# Patient Record
Sex: Male | Born: 1940 | Race: White | Hispanic: No | Marital: Single | Smoking: Former smoker
Health system: Southern US, Community
[De-identification: ages and names within clinical notes are randomized; demographics above are authoritative.]

## PROBLEM LIST (undated history)

## (undated) DIAGNOSIS — I82409 Acute embolism and thrombosis of unspecified deep veins of unspecified lower extremity: Secondary | ICD-10-CM

## (undated) DIAGNOSIS — N419 Inflammatory disease of prostate, unspecified: Secondary | ICD-10-CM

## (undated) DIAGNOSIS — I509 Heart failure, unspecified: Secondary | ICD-10-CM

## (undated) DIAGNOSIS — N3090 Cystitis, unspecified without hematuria: Secondary | ICD-10-CM

## (undated) DIAGNOSIS — C3490 Malignant neoplasm of unspecified part of unspecified bronchus or lung: Secondary | ICD-10-CM

## (undated) DIAGNOSIS — D649 Anemia, unspecified: Secondary | ICD-10-CM

## (undated) DIAGNOSIS — E119 Type 2 diabetes mellitus without complications: Secondary | ICD-10-CM

## (undated) DIAGNOSIS — B999 Unspecified infectious disease: Secondary | ICD-10-CM

## (undated) DIAGNOSIS — G4733 Obstructive sleep apnea (adult) (pediatric): Secondary | ICD-10-CM

## (undated) DIAGNOSIS — R911 Solitary pulmonary nodule: Secondary | ICD-10-CM

## (undated) DIAGNOSIS — I10 Essential (primary) hypertension: Secondary | ICD-10-CM

## (undated) DIAGNOSIS — N289 Disorder of kidney and ureter, unspecified: Secondary | ICD-10-CM

## (undated) DIAGNOSIS — Z9989 Dependence on other enabling machines and devices: Secondary | ICD-10-CM

## (undated) DIAGNOSIS — R319 Hematuria, unspecified: Secondary | ICD-10-CM

## (undated) DIAGNOSIS — G629 Polyneuropathy, unspecified: Secondary | ICD-10-CM

## (undated) DIAGNOSIS — A419 Sepsis, unspecified organism: Secondary | ICD-10-CM

## (undated) DIAGNOSIS — C189 Malignant neoplasm of colon, unspecified: Secondary | ICD-10-CM

## (undated) DIAGNOSIS — G4730 Sleep apnea, unspecified: Secondary | ICD-10-CM

## (undated) HISTORY — PX: LEG SURGERY: SHX1003

## (undated) HISTORY — PX: EYE SURGERY: SHX253

## (undated) HISTORY — DX: Malignant neoplasm of colon, unspecified: C18.9

## (undated) HISTORY — PX: CATARACT EXTRACTION: SUR2

## (undated) HISTORY — PX: CHOLECYSTECTOMY: SHX55

## (undated) HISTORY — PX: ORIF FEMUR FRACTURE: SHX2119

## (undated) HISTORY — PX: BACK SURGERY: SHX140

## (undated) HISTORY — DX: Solitary pulmonary nodule: R91.1

## (undated) HISTORY — PX: COLONOSCOPY: SHX174

## (undated) HISTORY — DX: Inflammatory disease of prostate, unspecified: N41.9

## (undated) HISTORY — DX: Cystitis, unspecified without hematuria: N30.90

## (undated) HISTORY — PX: CERVICAL FUSION: SHX112

## (undated) HISTORY — DX: Sepsis, unspecified organism: A41.9

## (undated) HISTORY — DX: Hematuria, unspecified: R31.9

## (undated) HISTORY — DX: Unspecified infectious disease: B99.9

---

## 2009-03-16 ENCOUNTER — Ambulatory Visit: Payer: Self-pay | Admitting: Cardiology

## 2009-04-21 ENCOUNTER — Ambulatory Visit (HOSPITAL_COMMUNITY): Admission: RE | Admit: 2009-04-21 | Discharge: 2009-04-21 | Payer: Self-pay | Admitting: General Surgery

## 2009-05-01 ENCOUNTER — Ambulatory Visit: Payer: Self-pay | Admitting: Cardiology

## 2009-06-02 ENCOUNTER — Ambulatory Visit (HOSPITAL_COMMUNITY): Admission: RE | Admit: 2009-06-02 | Discharge: 2009-06-02 | Payer: Self-pay | Admitting: General Surgery

## 2009-06-30 ENCOUNTER — Encounter: Payer: Self-pay | Admitting: Physician Assistant

## 2010-05-02 LAB — BASIC METABOLIC PANEL
BUN: 6 mg/dL (ref 6–23)
CO2: 28 mEq/L (ref 19–32)
Calcium: 8.5 mg/dL (ref 8.4–10.5)
Chloride: 103 mEq/L (ref 96–112)
Creatinine, Ser: 1.16 mg/dL (ref 0.4–1.5)
GFR calc Af Amer: >60 mL/min (ref >60)
GFR calc non Af Amer: >60 mL/min (ref >60)
Glucose, Bld: 240 mg/dL — ABNORMAL HIGH (ref 70–99)
Potassium: 3.6 mEq/L (ref 3.5–5.1)
Sodium: 139 mEq/L (ref 135–145)

## 2010-05-02 LAB — CBC
HCT: 34.9 % — ABNORMAL LOW (ref 39.0–52.0)
Hemoglobin: 11.8 g/dL — ABNORMAL LOW (ref 13.0–17.0)
MCHC: 33.8 g/dL (ref 30.0–36.0)
MCV: 94.6 fL (ref 78.0–100.0)
Platelets: 368 10*3/uL (ref 150–400)
RBC: 3.69 MIL/uL — ABNORMAL LOW (ref 4.22–5.81)
RDW: 15.2 % (ref 11.5–15.5)
WBC: 11 10*3/uL — ABNORMAL HIGH (ref 4.0–10.5)

## 2010-05-02 LAB — GLUCOSE, CAPILLARY
Glucose-Capillary: 187 mg/dL — ABNORMAL HIGH (ref 70–99)
Glucose-Capillary: 187 mg/dL — ABNORMAL HIGH (ref 70–99)

## 2011-06-14 DIAGNOSIS — G959 Disease of spinal cord, unspecified: Secondary | ICD-10-CM | POA: Diagnosis present

## 2011-08-22 ENCOUNTER — Ambulatory Visit: Payer: Medicare Other | Attending: Neurosurgery | Admitting: Physical Therapy

## 2011-08-22 DIAGNOSIS — R5381 Other malaise: Secondary | ICD-10-CM | POA: Insufficient documentation

## 2011-08-22 DIAGNOSIS — R269 Unspecified abnormalities of gait and mobility: Secondary | ICD-10-CM | POA: Insufficient documentation

## 2011-08-22 DIAGNOSIS — M542 Cervicalgia: Secondary | ICD-10-CM | POA: Insufficient documentation

## 2011-08-22 DIAGNOSIS — IMO0001 Reserved for inherently not codable concepts without codable children: Secondary | ICD-10-CM | POA: Insufficient documentation

## 2011-08-23 ENCOUNTER — Ambulatory Visit: Payer: Medicare Other | Admitting: Physical Therapy

## 2011-08-29 ENCOUNTER — Ambulatory Visit: Payer: Medicare Other | Admitting: *Deleted

## 2011-08-30 ENCOUNTER — Ambulatory Visit: Payer: Medicare Other | Admitting: Physical Therapy

## 2011-09-05 ENCOUNTER — Ambulatory Visit: Payer: Medicare Other | Admitting: *Deleted

## 2011-09-06 ENCOUNTER — Ambulatory Visit: Payer: Medicare Other | Admitting: Physical Therapy

## 2011-09-12 ENCOUNTER — Ambulatory Visit: Payer: Medicare Other | Attending: Neurosurgery | Admitting: *Deleted

## 2011-09-12 DIAGNOSIS — M542 Cervicalgia: Secondary | ICD-10-CM | POA: Insufficient documentation

## 2011-09-12 DIAGNOSIS — R269 Unspecified abnormalities of gait and mobility: Secondary | ICD-10-CM | POA: Insufficient documentation

## 2011-09-12 DIAGNOSIS — IMO0001 Reserved for inherently not codable concepts without codable children: Secondary | ICD-10-CM | POA: Insufficient documentation

## 2011-09-12 DIAGNOSIS — R5381 Other malaise: Secondary | ICD-10-CM | POA: Insufficient documentation

## 2011-09-13 ENCOUNTER — Ambulatory Visit: Payer: Medicare Other | Admitting: Physical Therapy

## 2011-09-19 ENCOUNTER — Ambulatory Visit: Payer: Medicare Other | Admitting: Physical Therapy

## 2011-09-20 ENCOUNTER — Ambulatory Visit: Payer: Medicare Other | Admitting: Physical Therapy

## 2011-09-26 ENCOUNTER — Ambulatory Visit: Payer: Medicare Other | Admitting: Physical Therapy

## 2011-09-27 DIAGNOSIS — Z981 Arthrodesis status: Secondary | ICD-10-CM | POA: Insufficient documentation

## 2011-10-03 ENCOUNTER — Ambulatory Visit: Payer: Medicare Other | Admitting: Physical Therapy

## 2011-10-04 ENCOUNTER — Ambulatory Visit: Payer: Medicare Other | Admitting: Physical Therapy

## 2011-10-10 ENCOUNTER — Ambulatory Visit: Payer: Medicare Other | Attending: Neurosurgery | Admitting: Physical Therapy

## 2011-10-10 DIAGNOSIS — R5381 Other malaise: Secondary | ICD-10-CM | POA: Insufficient documentation

## 2011-10-10 DIAGNOSIS — M542 Cervicalgia: Secondary | ICD-10-CM | POA: Insufficient documentation

## 2011-10-10 DIAGNOSIS — IMO0001 Reserved for inherently not codable concepts without codable children: Secondary | ICD-10-CM | POA: Insufficient documentation

## 2011-10-10 DIAGNOSIS — R269 Unspecified abnormalities of gait and mobility: Secondary | ICD-10-CM | POA: Insufficient documentation

## 2011-10-11 ENCOUNTER — Ambulatory Visit: Payer: Medicare Other | Admitting: Physical Therapy

## 2011-10-17 ENCOUNTER — Ambulatory Visit: Payer: Medicare Other | Admitting: Physical Therapy

## 2012-02-07 DIAGNOSIS — I82409 Acute embolism and thrombosis of unspecified deep veins of unspecified lower extremity: Secondary | ICD-10-CM

## 2012-02-07 HISTORY — DX: Acute embolism and thrombosis of unspecified deep veins of unspecified lower extremity: I82.409

## 2012-04-03 ENCOUNTER — Encounter (HOSPITAL_COMMUNITY): Payer: Self-pay | Admitting: Pharmacy Technician

## 2012-04-04 ENCOUNTER — Encounter (HOSPITAL_COMMUNITY): Payer: Self-pay

## 2012-04-04 ENCOUNTER — Encounter (HOSPITAL_COMMUNITY)
Admission: RE | Admit: 2012-04-04 | Discharge: 2012-04-04 | Disposition: A | Discharge status: Home / Self Care | Payer: Medicare Other | Source: Ambulatory Visit | Attending: Ophthalmology | Admitting: Ophthalmology

## 2012-04-04 HISTORY — DX: Essential (primary) hypertension: I10

## 2012-04-04 HISTORY — DX: Polyneuropathy, unspecified: G62.9

## 2012-04-04 HISTORY — DX: Sleep apnea, unspecified: G47.30

## 2012-04-04 HISTORY — DX: Type 2 diabetes mellitus without complications: E11.9

## 2012-04-04 LAB — HEMOGLOBIN AND HEMATOCRIT, BLOOD
HCT: 38.9 % — ABNORMAL LOW (ref 39.0–52.0)
Hemoglobin: 13.3 g/dL (ref 13.0–17.0)

## 2012-04-04 LAB — BASIC METABOLIC PANEL
BUN: 19 mg/dL (ref 6–23)
CO2: 26 mEq/L (ref 19–32)
Calcium: 9.3 mg/dL (ref 8.4–10.5)
Chloride: 100 mEq/L (ref 96–112)
Creatinine, Ser: 0.97 mg/dL (ref 0.50–1.35)
GFR calc Af Amer: >90 mL/min (ref >90)
GFR calc non Af Amer: 82 mL/min — ABNORMAL LOW (ref >90)
Glucose, Bld: 96 mg/dL (ref 70–99)
Potassium: 4.9 mEq/L (ref 3.5–5.1)
Sodium: 137 mEq/L (ref 135–145)

## 2012-04-04 NOTE — Patient Instructions (Signed)
Your procedure is scheduled on:  04/11/12  Report to [facility] at 08:30 AM.  Call this number if you have problems the morning of surgery: [phone]   Remember:   Do not eat or drink:After Midnight.  Take these medicines the morning of surgery with A SIP OF WATER: Lisinopril. May take your Gabapentin if needed. Take only half of your dose of insulin the evening before surgery.    Do not wear jewelry, make-up or nail polish.  Do not wear lotions, powders, or perfumes. You may wear deodorant.  Do not shave 48 hours prior to surgery. Men may shave face and neck.  Do not bring valuables to the hospital.  Contacts, dentures or bridgework may not be worn into surgery.  Leave suitcase in the car. After surgery it may be brought to your room.  For patients admitted to the hospital, checkout time is 11:00 AM the day of discharge.   Patients discharged the day of surgery will not be allowed to drive home.    Special Instructions: Start using your eye drops before surgery as directed by your eye doctor.   Please read over the following fact sheets that you were given: Anesthesia Post-op Instructions    Cataract Surgery  A cataract is a clouding of the lens of the eye. When a lens becomes cloudy, vision is reduced based on the degree and nature of the clouding. Surgery may be needed to improve vision. Surgery removes the cloudy lens and usually replaces it with a substitute lens (intraocular lens, IOL). LET YOUR EYE DOCTOR KNOW ABOUT:  Allergies to food or medicine.  Medicines taken including herbs, eyedrops, over-the-counter medicines, and creams.  Use of steroids (by mouth or creams).  Previous problems with anesthetics or numbing medicine.  History of bleeding problems or blood clots.  Previous surgery.  Other health problems, including diabetes and kidney problems.  Possibility of pregnancy, if this applies. RISKS AND COMPLICATIONS  Infection.  Inflammation of the eyeball  (endophthalmitis) that can spread to both eyes (sympathetic ophthalmia).  Poor wound healing.  If an IOL is inserted, it can later fall out of proper position. This is very uncommon.  Clouding of the part of your eye that holds an IOL in place. This is called an "after-cataract." These are uncommon, but easily treated. BEFORE THE PROCEDURE  Do not eat or drink anything except small amounts of water for 8 to 12 before your surgery, or as directed by your caregiver.  Unless you are told otherwise, continue any eyedrops you have been prescribed.  Talk to your primary caregiver about all other medicines that you take (both prescription and non-prescription). In some cases, you may need to stop or change medicines near the time of your surgery. This is most important if you are taking blood-thinning medicine.Do not stop medicines unless you are told to do so.  Arrange for someone to drive you to and from the procedure.  Do not put contact lenses in either eye on the day of your surgery. PROCEDURE There is more than one method for safely removing a cataract. Your doctor can explain the differences and help determine which is best for you. Phacoemulsification surgery is the most common form of cataract surgery.  An injection is given behind the eye or eyedrops are given to make this a painless procedure.  A small cut (incision) is made on the edge of the clear, dome-shaped surface that covers the front of the eye (cornea).  A tiny probe  is painlessly inserted into the eye. This device gives off ultrasound waves that soften and break up the cloudy center of the lens. This makes it easier for the cloudy lens to be removed by suction.  An IOL may be implanted.  The normal lens of the eye is covered by a clear capsule. Part of that capsule is intentionally left in the eye to support the IOL.  Your surgeon may or may not use stitches to close the incision. There are other forms of cataract  surgery that require a larger incision and stiches to close the eye. This approach is taken in cases where the doctor feels that the cataract cannot be easily removed using phacoemulsification. AFTER THE PROCEDURE  When an IOL is implanted, it does not need care. It becomes a permanent part of your eye and cannot be seen or felt.  Your doctor will schedule follow-up exams to check on your progress.  Review your other medicines with your doctor to see which can be resumed after surgery.  Use eyedrops or take medicine as prescribed by your doctor. Document Released: 01/12/2011 Document Revised: 04/17/2011 Document Reviewed: 01/12/2011 [facility] Patient Information 2013 [place].    PATIENT INSTRUCTIONS POST-ANESTHESIA  IMMEDIATELY FOLLOWING SURGERY:  Do not drive or operate machinery for the first twenty four hours after surgery.  Do not make any important decisions for twenty four hours after surgery or while taking narcotic pain medications or sedatives.  If you develop intractable nausea and vomiting or a severe headache please notify your doctor immediately.  FOLLOW-UP:  Please make an appointment with your surgeon as instructed. You do not need to follow up with anesthesia unless specifically instructed to do so.  WOUND CARE INSTRUCTIONS (if applicable):  Keep a dry clean dressing on the anesthesia/puncture wound site if there is drainage.  Once the wound has quit draining you may leave it open to air.  Generally you should leave the bandage intact for twenty four hours unless there is drainage.  If the epidural site drains for more than 36-48 hours please call the anesthesia department.  QUESTIONS?:  Please feel free to call your physician or the hospital operator if you have any questions, and they will be happy to assist you.

## 2012-04-11 ENCOUNTER — Encounter (HOSPITAL_COMMUNITY): Payer: Self-pay | Admitting: *Deleted

## 2012-04-11 ENCOUNTER — Encounter (HOSPITAL_COMMUNITY): Payer: Self-pay | Admitting: Anesthesiology

## 2012-04-11 ENCOUNTER — Encounter (HOSPITAL_COMMUNITY)
Admission: RE | Disposition: A | Discharge status: Home / Self Care | Payer: Self-pay | Source: Ambulatory Visit | Attending: Ophthalmology

## 2012-04-11 ENCOUNTER — Ambulatory Visit (HOSPITAL_COMMUNITY): Payer: Medicare Other | Admitting: Anesthesiology

## 2012-04-11 ENCOUNTER — Ambulatory Visit (HOSPITAL_COMMUNITY)
Admission: RE | Admit: 2012-04-11 | Discharge: 2012-04-11 | Disposition: A | Discharge status: Home / Self Care | Payer: Medicare Other | Source: Ambulatory Visit | Attending: Ophthalmology | Admitting: Ophthalmology

## 2012-04-11 DIAGNOSIS — Z01812 Encounter for preprocedural laboratory examination: Secondary | ICD-10-CM | POA: Insufficient documentation

## 2012-04-11 DIAGNOSIS — H2589 Other age-related cataract: Secondary | ICD-10-CM | POA: Insufficient documentation

## 2012-04-11 DIAGNOSIS — I10 Essential (primary) hypertension: Secondary | ICD-10-CM | POA: Insufficient documentation

## 2012-04-11 DIAGNOSIS — Z01810 Encounter for preprocedural cardiovascular examination: Secondary | ICD-10-CM | POA: Insufficient documentation

## 2012-04-11 DIAGNOSIS — H2181 Floppy iris syndrome: Secondary | ICD-10-CM | POA: Insufficient documentation

## 2012-04-11 DIAGNOSIS — E119 Type 2 diabetes mellitus without complications: Secondary | ICD-10-CM | POA: Insufficient documentation

## 2012-04-11 HISTORY — PX: CATARACT EXTRACTION W/PHACO: SHX586

## 2012-04-11 LAB — GLUCOSE, CAPILLARY: Glucose-Capillary: 80 mg/dL (ref 70–99)

## 2012-04-11 SURGERY — PHACOEMULSIFICATION, CATARACT, WITH IOL INSERTION
Anesthesia: Monitor Anesthesia Care | Site: Eye | Laterality: Right | Wound class: Clean

## 2012-04-11 MED ORDER — PHENYLEPHRINE HCL 2.5 % OP SOLN
OPHTHALMIC | Status: AC
  Filled 2012-04-11: qty 2

## 2012-04-11 MED ORDER — CYCLOPENTOLATE-PHENYLEPHRINE 0.2-1 % OP SOLN
OPHTHALMIC | Status: AC
  Filled 2012-04-11: qty 2

## 2012-04-11 MED ORDER — LACTATED RINGERS IV SOLN
INTRAVENOUS | Status: DC
  Administered 2012-04-11: 08:00:00 via INTRAVENOUS

## 2012-04-11 MED ORDER — MIDAZOLAM HCL 2 MG/2ML IJ SOLN
INTRAMUSCULAR | Status: AC
  Filled 2012-04-11: qty 2

## 2012-04-11 MED ORDER — EPINEPHRINE HCL 1 MG/ML IJ SOLN
INTRAOCULAR | Status: DC | PRN
  Administered 2012-04-11: 08:00:00

## 2012-04-11 MED ORDER — MIDAZOLAM HCL 2 MG/2ML IJ SOLN
1.0000 mg | INTRAMUSCULAR | Status: DC | PRN
  Administered 2012-04-11: 2 mg via INTRAVENOUS

## 2012-04-11 MED ORDER — NEOMYCIN-POLYMYXIN-DEXAMETH 3.5-10000-0.1 OP OINT
TOPICAL_OINTMENT | OPHTHALMIC | Status: AC
  Filled 2012-04-11: qty 3.5

## 2012-04-11 MED ORDER — EPINEPHRINE HCL 1 MG/ML IJ SOLN
INTRAMUSCULAR | Status: AC
  Filled 2012-04-11: qty 1

## 2012-04-11 MED ORDER — CYCLOPENTOLATE-PHENYLEPHRINE 0.2-1 % OP SOLN
1.0000 [drp] | OPHTHALMIC | Status: AC
  Administered 2012-04-11 (×3): 1 [drp] via OPHTHALMIC

## 2012-04-11 MED ORDER — POVIDONE-IODINE 5 % OP SOLN
OPHTHALMIC | Status: DC | PRN
  Administered 2012-04-11: 1 via OPHTHALMIC

## 2012-04-11 MED ORDER — BSS IO SOLN
INTRAOCULAR | Status: DC | PRN
  Administered 2012-04-11: 15 mL via INTRAOCULAR

## 2012-04-11 MED ORDER — LIDOCAINE HCL (PF) 1 % IJ SOLN
INTRAMUSCULAR | Status: AC
  Filled 2012-04-11: qty 2

## 2012-04-11 MED ORDER — LIDOCAINE HCL (PF) 1 % IJ SOLN
INTRAOCULAR | Status: DC | PRN
  Administered 2012-04-11: 08:00:00 via OPHTHALMIC

## 2012-04-11 MED ORDER — NA HYALUR & NA CHOND-NA HYALUR 0.55-0.5 ML IO KIT
PACK | INTRAOCULAR | Status: DC | PRN
  Administered 2012-04-11: 1 via OPHTHALMIC

## 2012-04-11 MED ORDER — TETRACAINE HCL 0.5 % OP SOLN
OPHTHALMIC | Status: AC
  Filled 2012-04-11: qty 2

## 2012-04-11 MED ORDER — LIDOCAINE 3.5 % OP GEL OPTIME - NO CHARGE
OPHTHALMIC | Status: DC | PRN
  Administered 2012-04-11: 1 [drp] via OPHTHALMIC

## 2012-04-11 MED ORDER — LIDOCAINE HCL 3.5 % OP GEL: 1.0000 "application " | Freq: Once | OPHTHALMIC | Status: DC

## 2012-04-11 MED ORDER — NEOMYCIN-POLYMYXIN-DEXAMETH 0.1 % OP OINT
TOPICAL_OINTMENT | OPHTHALMIC | Status: DC | PRN
  Administered 2012-04-11: 1 via OPHTHALMIC

## 2012-04-11 MED ORDER — PHENYLEPHRINE HCL 2.5 % OP SOLN
1.0000 [drp] | OPHTHALMIC | Status: AC
  Administered 2012-04-11 (×3): 1 [drp] via OPHTHALMIC

## 2012-04-11 MED ORDER — TETRACAINE HCL 0.5 % OP SOLN
1.0000 [drp] | OPHTHALMIC | Status: AC
  Administered 2012-04-11 (×3): 1 [drp] via OPHTHALMIC

## 2012-04-11 MED ORDER — LIDOCAINE HCL 3.5 % OP GEL
OPHTHALMIC | Status: AC
  Filled 2012-04-11: qty 5

## 2012-04-11 SURGICAL SUPPLY — 32 items

## 2012-04-11 NOTE — H&P (Signed)
I have reviewed the H&P, the patient was re-examined, and I have identified no interval changes in medical condition and plan of care since the history and physical of record  

## 2012-04-11 NOTE — Anesthesia Procedure Notes (Signed)
Procedure Name: MAC Date/Time: 04/11/2012 8:23 AM Performed by: Franco Nones Pre-anesthesia Checklist: Patient identified, Emergency Drugs available, Suction available, Timeout performed and Patient being monitored Patient Re-evaluated:Patient Re-evaluated prior to inductionOxygen Delivery Method: Nasal Cannula

## 2012-04-11 NOTE — Transfer of Care (Signed)
Immediate Anesthesia Transfer of Care Note  Patient: Gary Nelson  Procedure(s) Performed: Procedure(s) (LRB): CATARACT EXTRACTION PHACO AND INTRAOCULAR LENS PLACEMENT (IOC) (Right)  Patient Location: Shortstay  Anesthesia Type: MAC  Level of Consciousness: awake  Airway & Oxygen Therapy: Patient Spontanous Breathing   Post-op Assessment: Report given to PACU RN, Post -op Vital signs reviewed and stable and Patient moving all extremities  Post vital signs: Reviewed and stable  Complications: No apparent anesthesia complications

## 2012-04-11 NOTE — Anesthesia Preprocedure Evaluation (Signed)
Anesthesia Evaluation  Patient identified by MRN, date of birth, ID band Patient awake    Reviewed: Allergy & Precautions, H&P , NPO status , Patient's Chart, lab work & pertinent test results  Airway Mallampati: II TM Distance: >3 FB     Dental  (+) Poor Dentition, Missing, Chipped and Dental Advisory Given   Pulmonary sleep apnea, Continuous Positive Airway Pressure Ventilation and Oxygen sleep apnea , former smoker,  breath sounds clear to auscultation        Cardiovascular hypertension, Pt. on medications + dysrhythmias (hx irreg HR) Rhythm:Regular Rate:Normal     Neuro/Psych    GI/Hepatic   Endo/Other  diabetes, Well Controlled, Type 2, Insulin DependentMorbid obesity  Renal/GU      Musculoskeletal   Abdominal   Peds  Hematology   Anesthesia Other Findings   Reproductive/Obstetrics                           Anesthesia Physical Anesthesia Plan  ASA: III  Anesthesia Plan: MAC   Post-op Pain Management:    Induction: Intravenous  Airway Management Planned: Nasal Cannula  Additional Equipment:   Intra-op Plan:   Post-operative Plan:   Informed Consent: I have reviewed the patients History and Physical, chart, labs and discussed the procedure including the risks, benefits and alternatives for the proposed anesthesia with the patient or authorized representative who has indicated his/her understanding and acceptance.     Plan Discussed with:   Anesthesia Plan Comments:         Anesthesia Quick Evaluation

## 2012-04-11 NOTE — Op Note (Signed)
NAMELUM, STILLINGER                ACCOUNT NO.:  [id number]  MEDICAL RECORD NO.:  [id number]  LOCATION:  [facility]                          FACILITY:  [facility]  PHYSICIAN:  Susanne Greenhouse, MD       DATE OF BIRTH:  [birth year]  DATE OF PROCEDURE:  04/11/2012 DATE OF DISCHARGE:  04/11/2012                              OPERATIVE REPORT   PREOPERATIVE DIAGNOSIS:  Combined cataract, right eye, diagnosis code 366.19.  POSTOPERATIVE DIAGNOSES:  Combined cataract, right eye, diagnosis code 366.19; intraoperative floppy iris syndrome, right eye, diagnosis code 364.81.  PROCEDURE:  Phacoemulsification of posterior chamber intraocular lens implantation, right eye.  SURGEON:  Bonne Dolores. Hunt, MD  ANESTHESIA:  Topical with monitored anesthesia care and IV sedation.  OPERATIVE SUMMARY:  In the preoperative area, dilating drops were placed into the right eye.  The patient was then brought into the operating room where he was placed under general anesthesia.  The eye was then prepped and draped.  Beginning with a 75 blade, a paracentesis port was made at the surgeon's 2 o'clock position.  The anterior chamber was then filled with a 1% nonpreserved lidocaine solution with epinephrine.  This was followed by Viscoat to deepen the chamber.  A small fornix-based peritomy was performed superiorly.  Next, a single iris hook was placed through the limbus superiorly.  A 2.4-mm keratome blade was then used to make a clear corneal incision over the iris hook.  A bent cystotome needle and Utrata forceps were used to create a continuous tear capsulotomy.  Hydrodissection was performed using balanced salt solution on a fine cannula.  The lens nucleus was then removed using phacoemulsification in a quadrant cracking technique.  The cortical material was then removed with irrigation and aspiration.  The capsular bag and anterior chamber were refilled with Provisc.  The wound was widened to approximately 3 mm and a  posterior chamber intraocular lens was placed into the capsular bag without difficulty using an [employer] lens injecting system.  A single 10-0 nylon suture was then used to close the incision as well as stromal hydration.  The Provisc was removed from the anterior chamber and capsular bag with irrigation and aspiration.  At this point, the wounds were tested for leak, which were negative.  The anterior chamber remained deep and stable.  The patient tolerated the procedure well.  There were no operative complications, and he awoke from general anesthesia without problem.  No surgical specimens.  Prosthetic device used is a Theme park manager, model EnVista, model number MX60, power of 17.0, serial number is [id number].          ______________________________ Susanne Greenhouse, MD     KEH/MEDQ  D:  04/11/2012  T:  04/11/2012  Job:  [id number]

## 2012-04-11 NOTE — Brief Op Note (Signed)
Pre-Op Dx: Cataract OD Post-Op Dx: Cataract OD Surgeon: Sofia Vanmeter Anesthesia: Topical with MAC Surgery: Cataract Extraction with Intraocular lens Implant OD Implant: B&L enVista Specimen: None Complications: None 

## 2012-04-11 NOTE — Anesthesia Postprocedure Evaluation (Signed)
  Anesthesia Post-op Note  Patient: Gary Nelson  Procedure(s) Performed: Procedure(s) (LRB): CATARACT EXTRACTION PHACO AND INTRAOCULAR LENS PLACEMENT (IOC) (Right)  Patient Location:  Short Stay  Anesthesia Type: MAC  Level of Consciousness: awake  Airway and Oxygen Therapy: Patient Spontanous Breathing  Post-op Pain: none  Post-op Assessment: Post-op Vital signs reviewed, Patient's Cardiovascular Status Stable, Respiratory Function Stable, Patent Airway, No signs of Nausea or vomiting and Pain level controlled  Post-op Vital Signs: Reviewed and stable  Complications: No apparent anesthesia complications

## 2012-04-15 ENCOUNTER — Encounter (HOSPITAL_COMMUNITY): Payer: Self-pay | Admitting: Ophthalmology

## 2012-08-01 DIAGNOSIS — Z0000 Encounter for general adult medical examination without abnormal findings: Secondary | ICD-10-CM

## 2012-09-06 ENCOUNTER — Encounter: Payer: Self-pay | Admitting: Cardiology

## 2012-09-09 ENCOUNTER — Encounter: Payer: Self-pay | Admitting: Cardiovascular Disease

## 2012-09-09 ENCOUNTER — Ambulatory Visit (INDEPENDENT_AMBULATORY_CARE_PROVIDER_SITE_OTHER): Payer: Medicare Other | Admitting: Cardiovascular Disease

## 2012-09-09 ENCOUNTER — Other Ambulatory Visit: Payer: Self-pay | Admitting: *Deleted

## 2012-09-09 ENCOUNTER — Encounter: Payer: Self-pay | Admitting: Cardiology

## 2012-09-09 VITALS — BP 123/63 | HR 87 | Ht 65.0 in | Wt 267.0 lb

## 2012-09-09 DIAGNOSIS — R0602 Shortness of breath: Secondary | ICD-10-CM

## 2012-09-09 DIAGNOSIS — I10 Essential (primary) hypertension: Secondary | ICD-10-CM | POA: Insufficient documentation

## 2012-09-09 DIAGNOSIS — Z01810 Encounter for preprocedural cardiovascular examination: Secondary | ICD-10-CM

## 2012-09-09 DIAGNOSIS — G4730 Sleep apnea, unspecified: Secondary | ICD-10-CM

## 2012-09-09 DIAGNOSIS — N189 Chronic kidney disease, unspecified: Secondary | ICD-10-CM

## 2012-09-09 DIAGNOSIS — E119 Type 2 diabetes mellitus without complications: Secondary | ICD-10-CM | POA: Insufficient documentation

## 2012-09-09 DIAGNOSIS — I872 Venous insufficiency (chronic) (peripheral): Secondary | ICD-10-CM | POA: Insufficient documentation

## 2012-09-09 NOTE — Patient Instructions (Addendum)
Your physician has requested that you have an echocardiogram. Echocardiography is a painless test that uses sound waves to create images of your heart. It provides your doctor with information about the size and shape of your heart and how well your heart's chambers and valves are working. This procedure takes approximately one hour. There are no restrictions for this procedure.  Lexiscan cardiolite (Stress Test - 2 day).  Your physician recommends that you continue on your current medications as directed. Please refer to the Current Medication list given to you today.  A follow up appointment will be based off test results.

## 2012-09-09 NOTE — Progress Notes (Signed)
Patient ID: Gary Nelson, male   DOB: 06/27/1940, 72 y.o.   MRN: [id number]    CARDIOLOGY CONSULT NOTE  Patient ID: Gary Nelson MRN: [id number] DOB/AGE: May 01, 1940 72 y.o.  Admit date: (Not on file) Primary Physician: Oley Balm. Margo Common, MD  Reason for Consultation: preoperative risk stratification  HPI:  Mr. Los is a 72 y.o. Male with a PMH significant for HTN, morbid obesity, CKD, sleep apnea (uses CPAP), insulin-dependent diabetes mellitus, venous insufficiency, who is here for preoperative risk stratification for a colonic mass, for which he is to have surgery.  He underwent a colonoscopy on 08/30/2012 which revealed a mass at the distal colon-sigmoid junction, with some polyps distal to this.  He denies a h/o heart problems such has myocardial infarction, and also denies chest pain, palpitations, lightheadedness/dizziness, and syncope.  He has chronic shortness of breath which dates back to when he's 72 years old.  Review of systems complete and found to be negative unless listed above in HPI  Past Medical History: see HPI  SocHx: retired in Feb 2005 from [employer] after having worked there for 30.5 years.   Family History  Problem Relation Age of Onset  . Heart disease Father   . Heart attack Father   . Alzheimer's disease Mother     History   Social History  . Marital Status: Single    Spouse Name: N/A    Number of Children: N/A  . Years of Education: N/A   Occupational History  . Not on file.   Social History Main Topics  . Smoking status: Current Every Day Smoker -- 1.00 packs/day for 55 years    Types: Cigarettes    Start date: 02/07/1955  . Smokeless tobacco: Never Used  . Alcohol Use: Not on file     Comment: 3 beers week  . Drug Use: No  . Sexually Active: Yes    Birth Control/ Protection: None   Other Topics Concern  . Not on file   Social History Narrative  . No narrative on file      Physical exam Blood pressure 123/63,  pulse 87, height 5\' 5"  (1.651 m), weight 267 lb (121.11 kg), SpO2 98.00%. General: NAD, morbidly obese Neck: No JVD, no thyromegaly or thyroid nodule.  Lungs: Clear to auscultation bilaterally with normal respiratory effort. CV: Nondisplaced PMI.  Heart regular S1/S2, no S3/S4, no murmur.  1+ pitting pedal edema, with stasis dermatitis of the right leg.  No carotid bruit.  Normal pedal pulses.  Abdomen: Soft, nontender, no hepatosplenomegaly, no distention.  Skin: Intact without lesions or rashes.  Neurologic: Alert and oriented x 3.  Psych: Normal affect. Extremities: No clubbing or cyanosis.  HEENT: Normal.   Labs:   Lab Results  Component Value Date   WBC 11.0* 04/19/2009   HGB 13.3 04/04/2012   HCT 38.9* 04/04/2012   MCV 94.6 04/19/2009   PLT 368 04/19/2009   No results found for this basename: NA, K, CL, CO2, BUN, CREATININE, CALCIUM, LABALBU, PROT, BILITOT, ALKPHOS, ALT, AST, GLUCOSE,  in the last 168 hours No results found for this basename: CKTOTAL, CKMB, CKMBINDEX, TROPONINI    No results found for this basename: CHOL   No results found for this basename: HDL   No results found for this basename: LDLCALC   No results found for this basename: TRIG   No results found for this basename: CHOLHDL   No results found for this basename: LDLDIRECT  EKG: Sinus rhythm, rate 95 bpm, axis within normal limits, 1st degree AV block with a PR of 226 msec, no acute ST-T wave changes.     ASSESSMENT AND PLAN:  1. Preoperative risk stratification: he has multiple comorbidities and risk factors for CAD, such as diabetes mellitus which is CHD risk equivalent. Given his lack of physical activity, it is difficult to determine his exertional capacity/functional status. For this reason, in order to more accurately assess this, I will proceed with an echocardiogram and a 2-day Lexiscan Cardiolite stress test. I   Signed: Prentice Docker, M.D., F.A.C.C. 09/09/2012, 9:16 AM

## 2012-09-11 ENCOUNTER — Other Ambulatory Visit (INDEPENDENT_AMBULATORY_CARE_PROVIDER_SITE_OTHER): Payer: Medicare Other

## 2012-09-11 ENCOUNTER — Other Ambulatory Visit: Payer: Self-pay

## 2012-09-11 DIAGNOSIS — R0602 Shortness of breath: Secondary | ICD-10-CM

## 2012-09-11 DIAGNOSIS — Z01810 Encounter for preprocedural cardiovascular examination: Secondary | ICD-10-CM

## 2012-09-13 ENCOUNTER — Telehealth: Payer: Self-pay | Admitting: Cardiovascular Disease

## 2012-09-13 DIAGNOSIS — Z01810 Encounter for preprocedural cardiovascular examination: Secondary | ICD-10-CM

## 2012-09-13 NOTE — Telephone Encounter (Signed)
Patient returned your call.

## 2012-09-16 ENCOUNTER — Telehealth: Payer: Self-pay | Admitting: Cardiology

## 2012-09-16 NOTE — Telephone Encounter (Signed)
Message copied by Burnice Logan on Mon Sep 16, 2012  9:56 AM ------      Message from: Prentice Docker A      Created: Thu Sep 12, 2012 10:05 AM       Reviewed. No changes. ------

## 2012-09-16 NOTE — Telephone Encounter (Signed)
Pt informed or results.

## 2012-09-17 ENCOUNTER — Telehealth: Payer: Self-pay | Admitting: *Deleted

## 2012-09-17 NOTE — Telephone Encounter (Signed)
Notes Recorded by Lesle Chris, LPN on 0/45/4098 at 11:23 AM Patient notified and verbalized understanding.

## 2012-09-17 NOTE — Telephone Encounter (Signed)
Message copied by Lesle Chris on Tue Sep 17, 2012 11:23 AM ------      Message from: Prentice Docker A      Created: Tue Sep 17, 2012 10:18 AM       Normal stress test. Please inform pt of results. ------

## 2012-09-20 ENCOUNTER — Encounter: Payer: Self-pay | Admitting: *Deleted

## 2012-09-29 HISTORY — PX: COLON SURGERY: SHX602

## 2012-10-25 DIAGNOSIS — C189 Malignant neoplasm of colon, unspecified: Secondary | ICD-10-CM

## 2012-10-25 DIAGNOSIS — Z23 Encounter for immunization: Secondary | ICD-10-CM

## 2012-10-25 DIAGNOSIS — D473 Essential (hemorrhagic) thrombocythemia: Secondary | ICD-10-CM

## 2012-10-25 DIAGNOSIS — D72829 Elevated white blood cell count, unspecified: Secondary | ICD-10-CM

## 2012-11-12 DIAGNOSIS — C189 Malignant neoplasm of colon, unspecified: Secondary | ICD-10-CM

## 2012-11-12 DIAGNOSIS — L0390 Cellulitis, unspecified: Secondary | ICD-10-CM

## 2012-11-12 DIAGNOSIS — L0291 Cutaneous abscess, unspecified: Secondary | ICD-10-CM

## 2012-11-12 DIAGNOSIS — A4902 Methicillin resistant Staphylococcus aureus infection, unspecified site: Secondary | ICD-10-CM

## 2012-12-03 DIAGNOSIS — Z5111 Encounter for antineoplastic chemotherapy: Secondary | ICD-10-CM

## 2012-12-03 DIAGNOSIS — C189 Malignant neoplasm of colon, unspecified: Secondary | ICD-10-CM

## 2012-12-05 DIAGNOSIS — C189 Malignant neoplasm of colon, unspecified: Secondary | ICD-10-CM

## 2012-12-12 ENCOUNTER — Other Ambulatory Visit: Payer: Self-pay

## 2012-12-17 DIAGNOSIS — E119 Type 2 diabetes mellitus without complications: Secondary | ICD-10-CM

## 2012-12-17 DIAGNOSIS — E162 Hypoglycemia, unspecified: Secondary | ICD-10-CM

## 2012-12-17 DIAGNOSIS — Z22322 Carrier or suspected carrier of Methicillin resistant Staphylococcus aureus: Secondary | ICD-10-CM

## 2012-12-17 DIAGNOSIS — C189 Malignant neoplasm of colon, unspecified: Secondary | ICD-10-CM

## 2012-12-18 DIAGNOSIS — C189 Malignant neoplasm of colon, unspecified: Secondary | ICD-10-CM

## 2012-12-18 DIAGNOSIS — Z5111 Encounter for antineoplastic chemotherapy: Secondary | ICD-10-CM

## 2012-12-20 DIAGNOSIS — C189 Malignant neoplasm of colon, unspecified: Secondary | ICD-10-CM

## 2012-12-30 DIAGNOSIS — Z5111 Encounter for antineoplastic chemotherapy: Secondary | ICD-10-CM

## 2012-12-30 DIAGNOSIS — C189 Malignant neoplasm of colon, unspecified: Secondary | ICD-10-CM

## 2013-01-01 DIAGNOSIS — C189 Malignant neoplasm of colon, unspecified: Secondary | ICD-10-CM

## 2013-01-14 DIAGNOSIS — Z5111 Encounter for antineoplastic chemotherapy: Secondary | ICD-10-CM

## 2013-01-14 DIAGNOSIS — C189 Malignant neoplasm of colon, unspecified: Secondary | ICD-10-CM

## 2013-01-16 DIAGNOSIS — C189 Malignant neoplasm of colon, unspecified: Secondary | ICD-10-CM

## 2013-02-17 ENCOUNTER — Non-Acute Institutional Stay (SKILLED_NURSING_FACILITY): Payer: Medicare Other | Admitting: Internal Medicine

## 2013-02-17 DIAGNOSIS — R6520 Severe sepsis without septic shock: Secondary | ICD-10-CM

## 2013-02-17 DIAGNOSIS — C189 Malignant neoplasm of colon, unspecified: Secondary | ICD-10-CM

## 2013-02-17 DIAGNOSIS — R197 Diarrhea, unspecified: Secondary | ICD-10-CM

## 2013-02-17 DIAGNOSIS — I82409 Acute embolism and thrombosis of unspecified deep veins of unspecified lower extremity: Secondary | ICD-10-CM

## 2013-02-17 DIAGNOSIS — R6521 Severe sepsis with septic shock: Principal | ICD-10-CM

## 2013-02-17 DIAGNOSIS — A419 Sepsis, unspecified organism: Secondary | ICD-10-CM

## 2013-02-19 ENCOUNTER — Non-Acute Institutional Stay (SKILLED_NURSING_FACILITY): Payer: Medicare Other | Admitting: Internal Medicine

## 2013-02-19 DIAGNOSIS — I824Y9 Acute embolism and thrombosis of unspecified deep veins of unspecified proximal lower extremity: Secondary | ICD-10-CM

## 2013-02-19 DIAGNOSIS — R197 Diarrhea, unspecified: Secondary | ICD-10-CM | POA: Insufficient documentation

## 2013-02-19 DIAGNOSIS — I82419 Acute embolism and thrombosis of unspecified femoral vein: Secondary | ICD-10-CM

## 2013-02-19 NOTE — Progress Notes (Signed)
Patient ID: Gary Nelson, male   DOB: 06/11/1940, 73 y.o.   MRN: [id number]   This is an acute visit.  Level of care skilled.  Facility.--[facility].  Chief complaint-acute visit secondary to left leg DVT  History of present illness.  Patient is a pleasant 73 year old male recently hospitalized [place] for intractable diarrhea.  He had been receiving chemotherapy for history of colon cancer.  ER he was found to have an elevated lactic acid consistent with severe sepsis.  He received IV fluids and was treated with Invanz Zosyn and vancomycin.  He was also severely hypokalemic and this was supplemented.  Blood cultures were obtained as well as urine cultures apparently the urine culture did grew out Proteus mirabilis.  Again this was treated with antibiotics.  Apparently he continue to have some diarrhea but this got betterCdifcultures were negativ  Dr. Dellia Nims did see him a couple days ago and was concerned apparently of some edema of his left leg subsequently a venous Doppler ultrasound was ordered which did come back showing a mid aspect femoral vein thrombus extending into the popliteal and calf area.  He is not really complaining of any specific pain today area  He has been started on  Eliquest  Currently he denies any fever chills shortness of breath or chest pain-he continues to have diarrhea he does not complaining of any abdominal pain nausea or vomiting.  Family medical social history as been reviewed per discharge summary from 02/14/2013.  Medications have been reviewed per MAR.  Review of systems.  In general no complaints of fever or chills.  Respiratory does not complain of shortness protocol.  Cardiac does not complain of chest pain does have some history CHF on low-dose Lasix.  GI continues to complain of diarrhea but no abdominal pain nausea or vomiting.  History of colon cancer as been receiving chemotherapy.  Muscle skeletal does not really complain of  joint pain or leg pain-.  Neurologic does not complain of headache or dizziness.  Physical exam.  Temperature is 98.3 pulse 90 respirations 20 blood pressure 118/68.  In general this is a pleasant elderly male in no distress lying comfortably in bed.  His skin is warm and dry.  He appears to have some venous stasis changes of the shins bilaterally.  Chest is clear to auscultation no labored breathing.  Heart is regular rate and rhythm without murmur gallop or rub.  Abdomen is somewhat obese with a well-healed surgical scar there are positive bowel sounds abdomen is soft.  Muscle skeletal is able to move all extremities x4 does have some mild edema of his left leg compared to the right but this is not a big difference today he is able to move his left lower extremity--  capillary refill is intact dorsal pedal pulse is significantly reduced-the area is not acutely tender or erythematous Does appear to have some erythematous changes to his shins bilaterally.  Neurologic is grossly intact speech is clear.  Psych he is alert and oriented x3 pleasant and appropriate.  Labs.  02/13/2013.  WBC 14.1 hemoglobin 11.5 platelets 165.  Sodium 137 potassium 4 BUN 8 creatinine 0.79.  Calcium 7.8 liver function tests within normal limits except albumin of 2.4.  Assessment and plan.  #1 left leg DVT--new problem-- as noted above-he has been started onEliquist--10 mg daily for 7 days and then 5 mg daily thereafter-clinically appears stable Will need to be on bedrest today-also Ace wrap foot to knee to help with any possible  phlebitic pain--as discussed with Dr. Dellia Nims via phone.  #2-diarrhea-update labs have been ordered would like to see where his electrolytes stand---apparently this has been a long-term situation and I suspect may have some etiology with his chemotherapy and GI issues as noted above-he does continue lactobacillus-C. difficile cultures were negative in the hospital -- continue  to monitor  [id number]    .

## 2013-02-25 NOTE — Progress Notes (Addendum)
Patient ID: Gary Nelson, male   DOB: August 31, 1940, 73 y.o.   MRN: [id number]                HISTORY & PHYSICAL  DATE:  02/17/2013    FACILITY: [facility]    LEVEL OF CARE:   SNF   HISTORY OF PRESENT ILLNESS:  This is a gentleman who was admitted to hospital on 02/08/2013 to [facility].  He was noted to be in acute septic shock.  Blood cultures and urine cultures were obtained.  Proteus was found in his urine.  The patient was placed on IV Invanz and vancomycin, and received fluid resuscitation as he was hypotensive.   His systemic inflammatory response syndrome improved.  His discharge white count was 14,100, hemoglobin of 11.5.     The patient also tells me that he had a colon cancer resected in August of 2014.  He had post event chemotherapy, 4/12 treatments.  He was going back for a repeat colonoscopy and afterwards he developed intractable diarrhea.  His diarrhea actually continued during this hospitalization.  He had multiple stools for C.diff which were negative.  He saw GI and possibly had a second colonoscopy that was clear.  The patient tells me that he is still having four soft bowel movements a day.  This is improved, but not resolved from his point of view.  Stool studies also showed no ova or parasite.  Stools were negative for C.diff on C&S, including Campylobacter.  His potassium was 2.3 on admission, sodium 125 on admission, BUN 55 and creatinine 2.93 on admission.    PAST MEDICAL HISTORY/PROBLEM LIST:  Type 2 diabetes.    Hypertension.    Colon CA, resected in August 2014.    Cholecystectomy.    ORIF of the right femur.    Cervical fusion.    Partial colectomy.    Ureteral stenting.    Diabetic neuropathy.    CURRENT MEDICATIONS:  Medication list is reviewed.    Lactobacillus every day.    K-dur 20 q.d.    Mag oxide 400 b.i.d.    Nicotine patch 21 daily for 11 days, then drop to 14 mg for two weeks.    Protonix 40 b.i.d.    Enteric-coated  aspirin 81 q.d.    Neurontin 300 p.o. b.i.d. p.r.n.    Insulin sliding scale.    Lasix 20 q.d.    SOCIAL HISTORY: HOUSING:  The patient states he lived with his mother in [place].  His mother is also a patient here.   FUNCTIONAL STATUS:  He was functionally independent at some point until he became sick with this intractable diarrhea, according to him.    REVIEW OF SYSTEMS:   CHEST/RESPIRATORY:  No shortness of breath.   CARDIAC:   No chest pain.   GI:  No abdominal pain.   GU:  No dysuria.     PHYSICAL EXAMINATION:   GENERAL APPEARANCE:  The patient is not in any distress.   HEENT:   MOUTH/THROAT:   No oral lesions are seen.   NECK/THYROID:  No thyroid, no lymph.   CHEST/RESPIRATORY:  Clear air entry bilaterally.   CARDIOVASCULAR:  CARDIAC:   Heart sounds are normal.  He appears to be euvolemic.   GASTROINTESTINAL:  ABDOMEN:   Obese.  His surgical partial colectomy scar is noted.  There is no infection here.   LIVER/SPLEEN/KIDNEYS:  No liver, no spleen.  No tenderness.   GENITOURINARY:  BLADDER:   Not distended.  CIRCULATION:   EDEMA/VARICOSITIES:  Extremities:  He has venous stasis in his right leg.  There is edema and erythema especially in the medial part of the left leg.  This is tender and warm.  A DVT will need to be excluded, although this may represent a cellulitis.  Further, the patient tells me that his leg has looked like this for a long period of time.  Nevertheless, adverse considerations will need to be ruled out.   NEUROLOGICAL:    SENSATION/STRENGTH:  He has antigravity strength.  Changes of diabetic neuropathy.   ARTERIAL:  Peripheral pulses are palpable.     ASSESSMENT/PLAN:  Septic shock.  Possibly secondary to a UTI.  I do not actually see that his blood cultures were positive.  Would wonder if a lot of this was hypovolemic shock from his diarrhea.  He had aggressive fluid resuscitation and broad spectrum antibiotics and he has recovered.     Intractable  diarrhea.  If there is an exact diagnosis here, I do not see it in the discharge summary.  He  apparently had a repeat colonoscopy that did not show a specific issue.  He is still having four loose stools a day.  It is noted that he did have endoscopic polypectomy on 01/24/2013 and a biopsy of the large intestine on the same date.  I do not have the latter results.    Severe fluid and electrolyte problems on presentation including acute renal failure, severe hypokalemia, metabolic acidosis.  His potassium and magnesium will need to be rechecked.    Type 2 diabetes with diabetic neuropathy.  Insulin dependent.  He is only on a sliding scale.  He was on insulin for the last 15-20 years, per the patient, although I am not exactly sure what formulation and dosage.    Colon cancer.  The status of this is not totally clear, although he has been extensively worked up.    History of obstructive sleep apnea, requiring CPAP.    Tobacco abuse.  He is on a nicotine patch coverage.    ?DVT of the Right leg  I am going to recheck this gentleman's electrolytes.  I will have them monitor his stooling.  Duplex ultrasound ordered.   CPT CODE: 65465

## 2013-03-19 ENCOUNTER — Non-Acute Institutional Stay (SKILLED_NURSING_FACILITY): Payer: Medicare Other | Admitting: Internal Medicine

## 2013-03-19 DIAGNOSIS — I82419 Acute embolism and thrombosis of unspecified femoral vein: Secondary | ICD-10-CM

## 2013-03-19 DIAGNOSIS — I509 Heart failure, unspecified: Secondary | ICD-10-CM

## 2013-03-19 DIAGNOSIS — I824Y9 Acute embolism and thrombosis of unspecified deep veins of unspecified proximal lower extremity: Secondary | ICD-10-CM

## 2013-03-19 DIAGNOSIS — R197 Diarrhea, unspecified: Secondary | ICD-10-CM

## 2013-03-19 DIAGNOSIS — E119 Type 2 diabetes mellitus without complications: Secondary | ICD-10-CM

## 2013-03-19 DIAGNOSIS — A419 Sepsis, unspecified organism: Secondary | ICD-10-CM

## 2013-03-19 NOTE — Progress Notes (Signed)
Patient ID: Gary Nelson, male   DOB: 21-Jan-1941, 73 y.o.   MRN: [id number]   FACILITY: [facility]  LEVEL OF CARE: SNF  This is a discharge note.  Chief complaint-discharge no.   HISTORY OF PRESENT ILLNESS: This is a gentleman who was admitted to hospital on 02/08/2013 to [facility]. He was noted to be in acute septic shock. Blood cultures and urine cultures were obtained. Proteus was found in his urine. The patient was placed on IV Invanz and vancomycin, and received fluid resuscitation as he was hypotensive. His systemic inflammatory response syndrome improved. His discharge white count was 14,100, hemoglobin of 11.5.  The patient also apparently  had a colon cancer resected in August of 2014. He had post event chemotherapy, 4/12 treatments. He was going back for a repeat colonoscopy and afterwards he developed intractable diarrhea. His diarrhea actually continued during the hospitalization. He had multiple stools for C.diff which were negative. He saw GI and possibly had a second colonoscopy that was clear.  Apparently the diarrhea is improved-  , he is being followed by oncology for his history of colon cancer in fact apparently he  went there this afternoon  Earlier in his stay at the facility Dr. Dellia Nims did note some left leg edema subsequently a venous Doppler was ordered which came back showing a left leg DVT mid aspect femoral vein extending into the popliteal calf area-he was started onEliquist twice a day and this subsequently has been reduced to 5 mg a day   PAST MEDICAL HISTORY/PROBLEM LIST:  Type 2 diabetes.  Hypertension.  Colon CA, resected in August 2014.  Cholecystectomy.  ORIF of the right femur.  Cervical fusion.  Partial colectomy.  Ureteral stenting.  Diabetic neuropathy.  CURRENT MEDICATIONS: Medication list is reviewed  Eliquis 5 mg daily 20 q.d.  Mag oxide 400 b.i.d.  Nicotine patch qdaily 14 mg for two weeks.  Protonix 40 b.i.d.  Enteric-coated  aspirin 81 q.d.  Neurontin 300 p.o. b.i.d. p.r.n.  Insulin sliding scale.  Lasix 20 q.d.  SOCIAL HISTORY:  HOUSING: The patientlives with his mother in [place]..  FUNCTIONAL STATUS: He was functionally independent at some point until he became sick with intractable diarrhea, according to him.--Has gained strength here and appears to be doing significantly better--quite functional ambulating in his wheelchair   REVIEW OF SYSTEMS Gen.-feels he's doing well does not complaining of any fever chills: Skin-does not complaining any rashes or itching.  Head ears eyes nose mouth and throat-does not complaining of visual changes sore throat  CHEST/RESPIRATORY: No shortness of breath.or cough  CARDIAC: No chest pain--recent history of right leg DVT this is being treated and appears to have improved according to patient.  GI: No abdominal pain.-Nausea vomiting diarrhea or constipation  GU: No dysuria.  Neurologic-does not complaining of any headache or dizziness   PHYSICAL EXAMINATION: Temperature 98.5 pulse 72 respirations 20 blood pressure 136/70 these appear relatively stable weight is 244 today-it appears he has somewhat fluctuating weights had been around 240 high 230's for the past week the lowest I see is 2 37 on February 5 I do see 245 on January 31  GENERAL APPEARANCE: The patient is not in any distress--sitting comfortably in his wheelchair.  HEENT:  MOUTH/THROAT: No oral lesions are seen.  NECK/THYROID: No thyroid, no lymph.  CHEST/RESPIRATORY: Clear air entry bilaterally.  No Labored breathing CARDIOVASCULAR:  CARDIAC: Heart sounds are normal. He appears to be euvolemic.  GASTROINTESTINAL:  ABDOMEN: Obese. His surgical partial  colectomy scar is noted. There is no infection here.  LIVER/SPLEEN/KIDNEYS: No liver, no spleen. No tenderness.  Marland Kitchen  CIRCULATION:  EDEMA/VARICOSITIES: Extremities: He has venous stasis in his right leg--the edema appears to be somewhat improved according to  patient.  Muscle skeletal-ambulates well in wheelchair moves extremities at baseline has lower extremity weakness  .  NEUROLOGICAL:  SENSATION/STRENGTH: He has antigravity strength. Changes of diabetic neuropathy.  Psych-he is alert and oriented x3 pleasant and appropriate   Labs.  02/22/2013.  WBC 11.6 hemoglobin 11.5 platelets 392  Granulocyte percentage normal at 62 of grands normal at 7.3. t the monocytes somewhat elevated at 2.3 with percentage elevated at 20.  02/20/2013.  WBC 11.8 hemoglobin 11.0 platelets 296.  BUN 11 creatinine 1.09 sodium 139 potassium 4.1.  Liver function tests within normal limits except albumin of 2.6.  Hemoglobin A1c 7.6  .   ASSESSMENT/PLAN:  Septic shock.  Appears to have recovered well from this status post antibiotic treatment and IV fluid resuscitation.  Intractable diarrhea. apparently this has resolved would like his electrolytes checked  apparently  --this has been done currently by oncology today according to patient  .  Type 2 diabetes with diabetic neuropathy. Insulin dependent. He is only on a sliding scale. He was on insulin for the last 15-20 years, per the patient, it appears he has seldom needed sliding scale blood sugars in the morning appear to run fairly consistently in the low mid 100s-as well as at noon and at 5 PM I only see one reading over 200.  At bedtime sugars also appeared largely in the mid 100s  Colon cancer. The status of this is not totally clear, although he has been extensively worked up--as noted above he is followed by oncology.  History of obstructive sleep apnea, requiring CPAP.--This has been stable  Tobacco abuse. He is on a nicotine patch coverage.  DVT of the Right leg--continues onEliquist--clinically this appears to have improved  questionweight gain-there is a lot of variability on the scale readings here-per chart review appears he does have some history of CHF with a low normal ejection  fractionclinically   he is stable-will need expedient followup by primary care provider--and apparently this has been arranged for later this week   CPT CODE: 99316--of note greater than 30 minutes spent on this discharge summary

## 2013-03-24 DIAGNOSIS — E119 Type 2 diabetes mellitus without complications: Secondary | ICD-10-CM

## 2013-03-24 DIAGNOSIS — C189 Malignant neoplasm of colon, unspecified: Secondary | ICD-10-CM

## 2013-03-24 DIAGNOSIS — I509 Heart failure, unspecified: Secondary | ICD-10-CM

## 2013-03-24 DIAGNOSIS — IMO0001 Reserved for inherently not codable concepts without codable children: Secondary | ICD-10-CM

## 2015-02-17 DIAGNOSIS — C189 Malignant neoplasm of colon, unspecified: Secondary | ICD-10-CM | POA: Diagnosis not present

## 2015-02-17 DIAGNOSIS — Z95828 Presence of other vascular implants and grafts: Secondary | ICD-10-CM | POA: Diagnosis not present

## 2015-02-22 DIAGNOSIS — R195 Other fecal abnormalities: Secondary | ICD-10-CM | POA: Diagnosis not present

## 2015-02-24 DIAGNOSIS — K6389 Other specified diseases of intestine: Secondary | ICD-10-CM | POA: Diagnosis not present

## 2015-02-24 DIAGNOSIS — Z85038 Personal history of other malignant neoplasm of large intestine: Secondary | ICD-10-CM | POA: Diagnosis not present

## 2015-03-01 DIAGNOSIS — R195 Other fecal abnormalities: Secondary | ICD-10-CM | POA: Diagnosis not present

## 2015-03-10 ENCOUNTER — Encounter (INDEPENDENT_AMBULATORY_CARE_PROVIDER_SITE_OTHER): Payer: Self-pay | Admitting: *Deleted

## 2015-03-10 ENCOUNTER — Other Ambulatory Visit (INDEPENDENT_AMBULATORY_CARE_PROVIDER_SITE_OTHER): Payer: Self-pay | Admitting: *Deleted

## 2015-03-10 DIAGNOSIS — K921 Melena: Secondary | ICD-10-CM

## 2015-03-10 DIAGNOSIS — D509 Iron deficiency anemia, unspecified: Secondary | ICD-10-CM

## 2015-03-12 ENCOUNTER — Encounter (INDEPENDENT_AMBULATORY_CARE_PROVIDER_SITE_OTHER): Payer: Self-pay

## 2015-03-15 ENCOUNTER — Encounter (HOSPITAL_COMMUNITY)
Admission: RE | Disposition: A | Discharge status: Home / Self Care | Payer: Self-pay | Source: Ambulatory Visit | Attending: Internal Medicine

## 2015-03-15 ENCOUNTER — Ambulatory Visit (HOSPITAL_COMMUNITY)
Admission: RE | Admit: 2015-03-15 | Discharge: 2015-03-15 | Disposition: A | Discharge status: Home / Self Care | Payer: Medicare Other | Source: Ambulatory Visit | Attending: Internal Medicine | Admitting: Internal Medicine

## 2015-03-15 DIAGNOSIS — E1140 Type 2 diabetes mellitus with diabetic neuropathy, unspecified: Secondary | ICD-10-CM | POA: Diagnosis not present

## 2015-03-15 DIAGNOSIS — Z9049 Acquired absence of other specified parts of digestive tract: Secondary | ICD-10-CM | POA: Insufficient documentation

## 2015-03-15 DIAGNOSIS — K6389 Other specified diseases of intestine: Secondary | ICD-10-CM | POA: Insufficient documentation

## 2015-03-15 DIAGNOSIS — K319 Disease of stomach and duodenum, unspecified: Secondary | ICD-10-CM | POA: Diagnosis not present

## 2015-03-15 DIAGNOSIS — F17210 Nicotine dependence, cigarettes, uncomplicated: Secondary | ICD-10-CM | POA: Insufficient documentation

## 2015-03-15 DIAGNOSIS — Q2733 Arteriovenous malformation of digestive system vessel: Secondary | ICD-10-CM | POA: Insufficient documentation

## 2015-03-15 DIAGNOSIS — I10 Essential (primary) hypertension: Secondary | ICD-10-CM | POA: Diagnosis not present

## 2015-03-15 DIAGNOSIS — Z85038 Personal history of other malignant neoplasm of large intestine: Secondary | ICD-10-CM | POA: Insufficient documentation

## 2015-03-15 DIAGNOSIS — D649 Anemia, unspecified: Secondary | ICD-10-CM

## 2015-03-15 DIAGNOSIS — K922 Gastrointestinal hemorrhage, unspecified: Secondary | ICD-10-CM | POA: Diagnosis not present

## 2015-03-15 DIAGNOSIS — D500 Iron deficiency anemia secondary to blood loss (chronic): Secondary | ICD-10-CM | POA: Insufficient documentation

## 2015-03-15 DIAGNOSIS — K3189 Other diseases of stomach and duodenum: Secondary | ICD-10-CM | POA: Diagnosis not present

## 2015-03-15 HISTORY — PX: GIVENS CAPSULE STUDY: SHX5432

## 2015-03-15 SURGERY — IMAGING PROCEDURE, GI TRACT, INTRALUMINAL, VIA CAPSULE

## 2015-03-15 NOTE — H&P (Signed)
Gary Nelson is an 75 y.o. male.   Chief Complaint: Patient is here for small bowel given capsule study. HPI: Patient is 75 year old Caucasian male who has anemia secondary to occult GI bleed and a negative EGD and colonoscopy last year. He is also status post sigmoid colon resection for carcinoma in September 2014.  Past Medical History  Diagnosis Date  . Hypertension   . Diabetes mellitus without complication   . Neuropathy   . Sleep apnea   . Hematuria   . Cystitis   . Prostatitis     Past Surgical History  Procedure Laterality Date  . Cataract extraction      left  . Back surgery    . Cervical fusion      c4-c5  . Cholecystectomy    . Orif femur fracture      right  . Cataract extraction w/phaco Right 04/11/2012    Procedure: CATARACT EXTRACTION PHACO AND INTRAOCULAR LENS PLACEMENT (IOC);  Surgeon: Tonny Branch, MD;  Location: [facility];  Service: Ophthalmology;  Laterality: Right;  CDE:62.48  . Leg surgery      Family History  Problem Relation Age of Onset  . Heart disease Father   . Heart attack Father   . Alzheimer's disease Mother    Social History:  reports that he has been smoking Cigarettes.  He started smoking about 60 years ago. He has a 55 pack-year smoking history. He has never used smokeless tobacco. He reports that he does not use illicit drugs. His alcohol history is not on file.  Allergies: No Known Allergies  No prescriptions prior to admission    No results found for this or any previous visit (from the past 48 hour(s)). No results found.  ROS  Height '5\' 5"'$  (1.651 m), weight 290 lb (131.543 kg). Physical Exam   Assessment/Plan Anemia secondary to occult GI bleed in a negative EGD and colonoscopy in 2016. Status post sigmoid colon resection for adenocarcinoma in September 2014. Small bowel given capsule study for further evaluation of occult GI bleed and anemia.  Rogene Houston, MD 03/15/2015, 11:18 PM

## 2015-03-17 ENCOUNTER — Encounter (HOSPITAL_COMMUNITY): Payer: Self-pay | Admitting: Internal Medicine

## 2015-03-19 NOTE — Op Note (Signed)
Small Bowel Givens Capsule Study Procedure date:  03/15/2015  Referring Provider:  Burke Keels, MD  PCP:  Dr. [name], MD  Indication for procedure:  Patient is 75 year old Caucasian male who was history of GI bleed and anemia and no lesion found on EGD and colonoscopy. He has history of CRC with sigmoid colon resection in September 2014 and remains in remission. He had negative EGD and colonoscopy in 2016.   Findings:   Patient was able to swallow given capsule without any difficulty. 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 AV malformations noted at 27 min and 24 sec as well as at 31 min and 16 sec without stigmata of bleed. Small submucosal lesion noted at 3 hr 44 min and 29 sec without mucosal mucosal abnormalities.  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 Small submucosal lesion distal small bowel without worrisome features. Patient will be informed to refrain from taking NSAIDs. If H&H cannot be maintained with by mouth iron consider repeat EGD/push enteroscopy. If patient has active bleeding consider GI bleeding scan.

## 2015-03-22 ENCOUNTER — Telehealth (INDEPENDENT_AMBULATORY_CARE_PROVIDER_SITE_OTHER): Payer: Self-pay | Admitting: Internal Medicine

## 2015-03-22 NOTE — Telephone Encounter (Signed)
Mr. Campi called saying Dr. Laural Golden spoke to his brother on Friday, February 10th, 2017. He was returning Dr. Olevia Perches call and would like to receive his results. Please call the pt.  Pt's ph# [phone]  Thank you.

## 2015-03-31 DIAGNOSIS — Z95828 Presence of other vascular implants and grafts: Secondary | ICD-10-CM | POA: Diagnosis not present

## 2015-04-05 DIAGNOSIS — M160 Bilateral primary osteoarthritis of hip: Secondary | ICD-10-CM | POA: Diagnosis not present

## 2015-04-05 DIAGNOSIS — D500 Iron deficiency anemia secondary to blood loss (chronic): Secondary | ICD-10-CM | POA: Diagnosis not present

## 2015-04-05 DIAGNOSIS — I10 Essential (primary) hypertension: Secondary | ICD-10-CM | POA: Diagnosis not present

## 2015-04-05 DIAGNOSIS — E119 Type 2 diabetes mellitus without complications: Secondary | ICD-10-CM | POA: Diagnosis not present

## 2015-05-12 DIAGNOSIS — Z95828 Presence of other vascular implants and grafts: Secondary | ICD-10-CM | POA: Diagnosis not present

## 2015-05-24 DIAGNOSIS — S79912A Unspecified injury of left hip, initial encounter: Secondary | ICD-10-CM | POA: Diagnosis not present

## 2015-05-24 DIAGNOSIS — Z79899 Other long term (current) drug therapy: Secondary | ICD-10-CM | POA: Diagnosis not present

## 2015-05-24 DIAGNOSIS — Z7982 Long term (current) use of aspirin: Secondary | ICD-10-CM | POA: Diagnosis not present

## 2015-05-24 DIAGNOSIS — Z8614 Personal history of Methicillin resistant Staphylococcus aureus infection: Secondary | ICD-10-CM | POA: Diagnosis not present

## 2015-05-24 DIAGNOSIS — Z8249 Family history of ischemic heart disease and other diseases of the circulatory system: Secondary | ICD-10-CM | POA: Diagnosis not present

## 2015-05-24 DIAGNOSIS — M25552 Pain in left hip: Secondary | ICD-10-CM | POA: Diagnosis not present

## 2015-05-24 DIAGNOSIS — I10 Essential (primary) hypertension: Secondary | ICD-10-CM | POA: Diagnosis not present

## 2015-05-24 DIAGNOSIS — Z87891 Personal history of nicotine dependence: Secondary | ICD-10-CM | POA: Diagnosis not present

## 2015-05-24 DIAGNOSIS — Z833 Family history of diabetes mellitus: Secondary | ICD-10-CM | POA: Diagnosis not present

## 2015-05-24 DIAGNOSIS — M160 Bilateral primary osteoarthritis of hip: Secondary | ICD-10-CM | POA: Diagnosis not present

## 2015-05-24 DIAGNOSIS — Z794 Long term (current) use of insulin: Secondary | ICD-10-CM | POA: Diagnosis not present

## 2015-05-24 DIAGNOSIS — E119 Type 2 diabetes mellitus without complications: Secondary | ICD-10-CM | POA: Diagnosis not present

## 2015-05-24 DIAGNOSIS — Z85038 Personal history of other malignant neoplasm of large intestine: Secondary | ICD-10-CM | POA: Diagnosis not present

## 2015-05-25 DIAGNOSIS — M160 Bilateral primary osteoarthritis of hip: Secondary | ICD-10-CM | POA: Diagnosis not present

## 2015-05-25 DIAGNOSIS — G5623 Lesion of ulnar nerve, bilateral upper limbs: Secondary | ICD-10-CM | POA: Diagnosis not present

## 2015-06-04 DIAGNOSIS — M25561 Pain in right knee: Secondary | ICD-10-CM | POA: Diagnosis not present

## 2015-06-04 DIAGNOSIS — M25562 Pain in left knee: Secondary | ICD-10-CM | POA: Diagnosis not present

## 2015-06-04 DIAGNOSIS — M160 Bilateral primary osteoarthritis of hip: Secondary | ICD-10-CM | POA: Diagnosis not present

## 2015-06-23 DIAGNOSIS — R918 Other nonspecific abnormal finding of lung field: Secondary | ICD-10-CM | POA: Diagnosis not present

## 2015-06-23 DIAGNOSIS — C189 Malignant neoplasm of colon, unspecified: Secondary | ICD-10-CM | POA: Diagnosis not present

## 2015-06-23 DIAGNOSIS — Z980 Intestinal bypass and anastomosis status: Secondary | ICD-10-CM | POA: Diagnosis not present

## 2015-06-23 DIAGNOSIS — C187 Malignant neoplasm of sigmoid colon: Secondary | ICD-10-CM | POA: Diagnosis not present

## 2015-06-23 DIAGNOSIS — R911 Solitary pulmonary nodule: Secondary | ICD-10-CM | POA: Diagnosis not present

## 2015-06-24 ENCOUNTER — Other Ambulatory Visit (HOSPITAL_COMMUNITY): Payer: Self-pay | Admitting: Internal Medicine

## 2015-06-24 DIAGNOSIS — Z95828 Presence of other vascular implants and grafts: Secondary | ICD-10-CM | POA: Diagnosis not present

## 2015-06-24 DIAGNOSIS — C189 Malignant neoplasm of colon, unspecified: Secondary | ICD-10-CM

## 2015-06-24 DIAGNOSIS — R918 Other nonspecific abnormal finding of lung field: Secondary | ICD-10-CM | POA: Diagnosis not present

## 2015-06-24 DIAGNOSIS — Z9221 Personal history of antineoplastic chemotherapy: Secondary | ICD-10-CM | POA: Diagnosis not present

## 2015-06-25 ENCOUNTER — Other Ambulatory Visit: Payer: Self-pay | Admitting: Family Medicine

## 2015-06-25 DIAGNOSIS — M47812 Spondylosis without myelopathy or radiculopathy, cervical region: Secondary | ICD-10-CM

## 2015-06-25 DIAGNOSIS — R29898 Other symptoms and signs involving the musculoskeletal system: Secondary | ICD-10-CM | POA: Diagnosis not present

## 2015-06-25 DIAGNOSIS — M4802 Spinal stenosis, cervical region: Secondary | ICD-10-CM | POA: Diagnosis not present

## 2015-06-28 ENCOUNTER — Encounter (INDEPENDENT_AMBULATORY_CARE_PROVIDER_SITE_OTHER): Payer: Medicare Other | Admitting: Ophthalmology

## 2015-06-28 DIAGNOSIS — H26491 Other secondary cataract, right eye: Secondary | ICD-10-CM | POA: Diagnosis not present

## 2015-06-28 DIAGNOSIS — H35033 Hypertensive retinopathy, bilateral: Secondary | ICD-10-CM | POA: Diagnosis not present

## 2015-06-28 DIAGNOSIS — I10 Essential (primary) hypertension: Secondary | ICD-10-CM

## 2015-06-28 DIAGNOSIS — E11319 Type 2 diabetes mellitus with unspecified diabetic retinopathy without macular edema: Secondary | ICD-10-CM

## 2015-06-28 DIAGNOSIS — E113293 Type 2 diabetes mellitus with mild nonproliferative diabetic retinopathy without macular edema, bilateral: Secondary | ICD-10-CM | POA: Diagnosis not present

## 2015-06-28 DIAGNOSIS — H43813 Vitreous degeneration, bilateral: Secondary | ICD-10-CM

## 2015-06-30 ENCOUNTER — Encounter (HOSPITAL_COMMUNITY): Payer: Medicare Other

## 2015-07-01 ENCOUNTER — Ambulatory Visit (HOSPITAL_COMMUNITY)
Admission: RE | Admit: 2015-07-01 | Discharge: 2015-07-01 | Disposition: A | Discharge status: Home / Self Care | Payer: Medicare Other | Source: Ambulatory Visit | Attending: Internal Medicine | Admitting: Internal Medicine

## 2015-07-01 DIAGNOSIS — R911 Solitary pulmonary nodule: Secondary | ICD-10-CM | POA: Diagnosis not present

## 2015-07-01 DIAGNOSIS — I2510 Atherosclerotic heart disease of native coronary artery without angina pectoris: Secondary | ICD-10-CM | POA: Diagnosis not present

## 2015-07-01 DIAGNOSIS — C7800 Secondary malignant neoplasm of unspecified lung: Secondary | ICD-10-CM | POA: Insufficient documentation

## 2015-07-01 DIAGNOSIS — C189 Malignant neoplasm of colon, unspecified: Secondary | ICD-10-CM | POA: Insufficient documentation

## 2015-07-01 DIAGNOSIS — Z95828 Presence of other vascular implants and grafts: Secondary | ICD-10-CM | POA: Insufficient documentation

## 2015-07-01 DIAGNOSIS — Z9221 Personal history of antineoplastic chemotherapy: Secondary | ICD-10-CM | POA: Diagnosis not present

## 2015-07-01 DIAGNOSIS — I700 Atherosclerosis of aorta: Secondary | ICD-10-CM | POA: Insufficient documentation

## 2015-07-01 LAB — GLUCOSE, CAPILLARY: Glucose-Capillary: 131 mg/dL — ABNORMAL HIGH (ref 65–99)

## 2015-07-01 MED ORDER — FLUDEOXYGLUCOSE F - 18 (FDG) INJECTION
13.9800 | Freq: Once | INTRAVENOUS | Status: AC | PRN
  Administered 2015-07-01: 13.98 via INTRAVENOUS

## 2015-07-06 ENCOUNTER — Ambulatory Visit
Admission: RE | Admit: 2015-07-06 | Discharge: 2015-07-06 | Disposition: A | Discharge status: Home / Self Care | Payer: Medicare Other | Source: Ambulatory Visit | Attending: Family Medicine | Admitting: Family Medicine

## 2015-07-06 DIAGNOSIS — M47812 Spondylosis without myelopathy or radiculopathy, cervical region: Secondary | ICD-10-CM

## 2015-07-06 DIAGNOSIS — M4802 Spinal stenosis, cervical region: Secondary | ICD-10-CM | POA: Diagnosis not present

## 2015-07-08 DIAGNOSIS — Z9221 Personal history of antineoplastic chemotherapy: Secondary | ICD-10-CM | POA: Diagnosis not present

## 2015-07-08 DIAGNOSIS — C189 Malignant neoplasm of colon, unspecified: Secondary | ICD-10-CM | POA: Diagnosis not present

## 2015-07-08 DIAGNOSIS — Z720 Tobacco use: Secondary | ICD-10-CM | POA: Diagnosis not present

## 2015-07-08 DIAGNOSIS — R918 Other nonspecific abnormal finding of lung field: Secondary | ICD-10-CM | POA: Diagnosis not present

## 2015-07-13 DIAGNOSIS — R29898 Other symptoms and signs involving the musculoskeletal system: Secondary | ICD-10-CM | POA: Diagnosis not present

## 2015-07-13 DIAGNOSIS — M4712 Other spondylosis with myelopathy, cervical region: Secondary | ICD-10-CM | POA: Diagnosis not present

## 2015-07-13 DIAGNOSIS — E119 Type 2 diabetes mellitus without complications: Secondary | ICD-10-CM | POA: Diagnosis not present

## 2015-07-15 ENCOUNTER — Encounter (HOSPITAL_COMMUNITY): Payer: Medicare Other | Attending: Oncology | Admitting: Oncology

## 2015-07-15 ENCOUNTER — Encounter (HOSPITAL_COMMUNITY): Payer: Medicare Other

## 2015-07-15 ENCOUNTER — Encounter (HOSPITAL_COMMUNITY): Payer: Self-pay | Admitting: Oncology

## 2015-07-15 ENCOUNTER — Ambulatory Visit (HOSPITAL_COMMUNITY): Payer: Medicare Other | Admitting: Hematology & Oncology

## 2015-07-15 VITALS — BP 134/47 | HR 97 | Temp 97.7°F | Resp 16 | Wt 280.0 lb

## 2015-07-15 DIAGNOSIS — Z85038 Personal history of other malignant neoplasm of large intestine: Secondary | ICD-10-CM

## 2015-07-15 DIAGNOSIS — R911 Solitary pulmonary nodule: Secondary | ICD-10-CM

## 2015-07-15 DIAGNOSIS — R9389 Abnormal findings on diagnostic imaging of other specified body structures: Secondary | ICD-10-CM

## 2015-07-15 DIAGNOSIS — C189 Malignant neoplasm of colon, unspecified: Secondary | ICD-10-CM

## 2015-07-15 DIAGNOSIS — R938 Abnormal findings on diagnostic imaging of other specified body structures: Secondary | ICD-10-CM | POA: Diagnosis not present

## 2015-07-15 LAB — CBC WITH DIFFERENTIAL/PLATELET
Basophils Absolute: 0 10*3/uL (ref 0.0–0.1)
Basophils Relative: 1 %
Eosinophils Absolute: 0.1 10*3/uL (ref 0.0–0.7)
Eosinophils Relative: 1 %
HCT: 38 % — ABNORMAL LOW (ref 39.0–52.0)
Hemoglobin: 12.8 g/dL — ABNORMAL LOW (ref 13.0–17.0)
Lymphocytes Relative: 17 %
Lymphs Abs: 1.5 10*3/uL (ref 0.7–4.0)
MCH: 31.7 pg (ref 26.0–34.0)
MCHC: 33.7 g/dL (ref 30.0–36.0)
MCV: 94.1 fL (ref 78.0–100.0)
Monocytes Absolute: 1 10*3/uL (ref 0.1–1.0)
Monocytes Relative: 11 %
Neutro Abs: 5.9 10*3/uL (ref 1.7–7.7)
Neutrophils Relative %: 70 %
Platelets: 276 10*3/uL (ref 150–400)
RBC: 4.04 MIL/uL — ABNORMAL LOW (ref 4.22–5.81)
RDW: 13.7 % (ref 11.5–15.5)
WBC: 8.5 10*3/uL (ref 4.0–10.5)

## 2015-07-15 LAB — COMPREHENSIVE METABOLIC PANEL
ALT: 20 U/L (ref 17–63)
AST: 18 U/L (ref 15–41)
Albumin: 3.6 g/dL (ref 3.5–5.0)
Alkaline Phosphatase: 107 U/L (ref 38–126)
Anion gap: 6 (ref 5–15)
BUN: 22 mg/dL — ABNORMAL HIGH (ref 6–20)
CO2: 23 mmol/L (ref 22–32)
Calcium: 9 mg/dL (ref 8.9–10.3)
Chloride: 107 mmol/L (ref 101–111)
Creatinine, Ser: 0.96 mg/dL (ref 0.61–1.24)
GFR calc Af Amer: >60 mL/min (ref >60)
GFR calc non Af Amer: >60 mL/min (ref >60)
Glucose, Bld: 118 mg/dL — ABNORMAL HIGH (ref 65–99)
Potassium: 5 mmol/L (ref 3.5–5.1)
Sodium: 136 mmol/L (ref 135–145)
Total Bilirubin: 0.4 mg/dL (ref 0.3–1.2)
Total Protein: 6.9 g/dL (ref 6.5–8.1)

## 2015-07-15 NOTE — Assessment & Plan Note (Signed)
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 [name] followed by 6 months worth of adjuvant chemotherapy consisting of FOLFOX with a change in treatment to infusional 5-FU for progressive peripheral neuropathy to finish 6 months worth of treatment by Dr. [name].  Oncology history developed.  Staging in [facility] problem list is completed with current data.

## 2015-07-15 NOTE — Progress Notes (Signed)
[facility] Hematology/Oncology Consultation   Name: Gary Nelson      MRN: [id number]    Date: 07/15/2015 Time:5:37 PM   REFERRING PHYSICIAN:  Audree Camel, MD (Med Onc at [facility])  [place]:  Transfer of oncology care.   DIAGNOSIS:  New RLL pulmonary nodule that is hypermetabolic on PET imaging on 07/01/2015 with a history of Stage II colorectal adenocarcinoma having finished curative treatment in 2014-2015 consisting of partial colectomy on 10/04/2012 by Dr. [name] followed by 6 months worth of adjuvant chemotherapy consisting of FOLFOX with a change in treatment to infusional 5-FU for progressive peripheral neuropathy to finish 6 months worth of treatment by Dr. [name].  HISTORY OF PRESENT ILLNESS:   Gary Nelson is a 75 y.o. 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 [name] who is referred to the [facility] for transfer of oncology care in the setting of a new RLL pulmonary nodule that is hypermetabolic on PET imaging on 07/01/2015.  I personally reviewed and went over laboratory results with the patient.  The results are noted within this dictation.  We will update labs today.  I personally reviewed and went over radiographic studies with the patient.  The results are noted within this dictation.  PET imaging results and MR of C-spine results discussed in detail.  Chart is reviewed.  In general, in 2014 the patient was diagnosed with a Stage II (T3N0M0)- high risk- adenocarcinoma of colon having finished curative treatment in 2014- 2015 by Dr. [name] after undergoing definitive surgery by Dr. [name] on 10/04/2012.  He was treated with FOLFOX in the adjuvant setting, but was transitioned to 5FU infusional therapy due to progressive neuropathy.  Following treatment, he was followed per NCCN guidelines but in May 2017,  CT abd/pelvis imaging demonstrated concerned for a new pulmonary nodule which lead to a PET scan.  PET scan demonstrates hypermetabolic activity.  Unfortunately, his primary oncologist office is closing and therefore, the patient was referred to [place] for further work-up, management of his medical oncology care.  Mr. Gary Nelson denies any malignancy complaints, including B symptoms, weight loss, appetite changes, hemoptysis, cough, etc. His BIGGEST complaint is his back issues.  He notes that his primary care provider, Dr. Scotty Nelson, is working on getting the patient back to neurosurgery for evaluation.  He reports that 6 weeks ago, he was completely functional.  Now, he is nearly wheelchair bound due to radicular/neuropathic pain involving both upper extremities and mainly his right leg.  He notes that this issue is his priority.  "There is no point in worrying about my lung issue when I can't do anything."  "I'd rather not be around if I can't be functional."  Review of Systems  Constitutional: Negative.  Negative for weight loss.  HENT: Negative.  Negative for ear discharge, ear pain, hearing loss and tinnitus.   Eyes: Negative.  Negative for blurred vision and double vision.  Respiratory: Negative for cough, hemoptysis and sputum production.   Cardiovascular: Positive for leg swelling (chronic). Negative for chest pain.  Gastrointestinal: Negative for nausea, vomiting, abdominal pain, diarrhea, constipation, blood in stool and melena.  Genitourinary: Negative for dysuria, urgency and frequency.  Musculoskeletal: Negative.   Skin: Negative.   Neurological: Positive for sensory change. Negative for headaches.       As per HPI  Endo/Heme/Allergies: Negative.  Psychiatric/Behavioral: Negative.   14 point review of systems was performed and is negative except as detailed under history of present illness and above    PAST MEDICAL HISTORY:   Past Medical History  Diagnosis Date  . Hypertension   .  Diabetes mellitus without complication ([place])   . Neuropathy ([place])   . Sleep apnea   . Hematuria   . Cystitis   . Prostatitis   . Adenocarcinoma of colon ([place]) 07/15/2015  . Pulmonary nodule 07/15/2015  . Blood infection ([place])     ALLERGIES: No Known Allergies    MEDICATIONS: I have reviewed the patient's current medications.    Current Outpatient Prescriptions on File Prior to Visit  Medication Sig Dispense Refill  . aspirin 325 MG tablet Take 81 mg by mouth daily.     . clotrimazole (LOTRIMIN) 1 % cream Apply 1 application topically daily as needed (rash).     . furosemide (LASIX) 20 MG tablet Take 20 mg by mouth daily.    Marland Kitchen gabapentin (NEURONTIN) 300 MG capsule Take 300 mg by mouth 2 (two) times daily.    . insulin NPH (HUMULIN N,NOVOLIN N) 100 UNIT/ML injection Inject 15-30 Units into the skin 2 (two) times daily. 30 units in the morning and 15 units in the evening    . lisinopril (PRINIVIL,ZESTRIL) 40 MG tablet Take 40 mg by mouth daily.    . naproxen sodium (ANAPROX) 220 MG tablet Take 440 mg by mouth daily as needed (daily). Reported on 07/15/2015     No current facility-administered medications on file prior to visit.     PAST SURGICAL HISTORY Past Surgical History  Procedure Laterality Date  . Cataract extraction      left  . Back surgery    . Cervical fusion      c4-c5  . Cholecystectomy    . Orif femur fracture      right  . Cataract extraction w/phaco Right 04/11/2012    Procedure: CATARACT EXTRACTION PHACO AND INTRAOCULAR LENS PLACEMENT (IOC);  Surgeon: Tonny Branch, MD;  Location: [facility] ORS;  Service: Ophthalmology;  Laterality: Right;  CDE:62.48  . Leg surgery    . Givens capsule study N/A 03/15/2015    Procedure: GIVENS CAPSULE STUDY;  Surgeon: Rogene Houston, MD;  Location: [facility] ENDO SUITE;  Service: Endoscopy;  Laterality: N/A;  730    FAMILY HISTORY: Family History  Problem Relation Age of Onset  . Heart disease Father   . Heart attack Father   . Alzheimer's  disease Mother     SOCIAL HISTORY:  reports that he has quit smoking. His smoking use included Cigarettes. He started smoking about 60 years ago. He has a 55 pack-year smoking history. He has never used smokeless tobacco. He reports that he does not drink alcohol or use illicit drugs.  Social History   Social History  . Marital Status: Single    Spouse Name: N/A  . Number of Children: N/A  . Years of Education: N/A   Social History Main Topics  . Smoking status: Former Smoker -- 1.00 packs/day for 55 years    Types: Cigarettes    Start date: 02/07/1955  . Smokeless tobacco: Never Used  . Alcohol Use: No  . Drug Use: No  . Sexual Activity: Yes    Birth Control/ Protection: None   Other Topics Concern  . None   Social History Narrative  Single with no children. Presents today with his brother.   PERFORMANCE STATUS: The patient's  performance status is 3 - Symptomatic, >50% confined to bed  PHYSICAL EXAM: Most Recent Vital Signs: Blood pressure 134/47, pulse 97, temperature 97.7 F (36.5 C), resp. rate 16, weight 280 lb (127.007 kg), SpO2 95 %. General appearance: alert, cooperative, appears stated age, no distress, morbidly obese and accompanied by his brother Head: Normocephalic, without obvious abnormality, atraumatic Throat: normal findings: buccal mucosa normal and oropharynx pink & moist without lesions or evidence of thrush Neck: no adenopathy and supple, symmetrical, trachea midline Lungs: clear to auscultation bilaterally and normal percussion bilaterally Heart: regular rate and rhythm, S1, S2 normal, no murmur, click, rub or gallop Abdomen: soft, non-tender; bowel sounds normal; no masses,  no organomegaly and obese Extremities: venous stasis dermatitis noted Skin: texture normal or ecchymoses - arm(s) bilateral, lower leg(s) bilateral, skin tear on anterior left tibia, healing Lymph nodes: Cervical, supraclavicular, and axillary nodes normal. Neurologic: Grossly  normal  LABORATORY DATA:     RADIOGRAPHY:  CLINICAL DATA: Numbness in the hands and legs. Prior cervical surgery.  EXAM: MRI CERVICAL SPINE WITHOUT CONTRAST  TECHNIQUE: Multiplanar, multisequence MR imaging of the cervical spine was performed. No intravenous contrast was administered.  COMPARISON: None.  FINDINGS: Alignment: Physiologic.  Vertebrae: No fracture, evidence of discitis, or bone lesion.  Cord: Normal signal and morphology.  Posterior Fossa, vertebral arteries, paraspinal tissues: Negative.  Disc levels:  Discs: Anterior cervical fusion from C3 through C5 without failure complication.  C2-3: No significant disc bulge. No neural foraminal stenosis. No central canal stenosis.  C3-4: Interbody fusion. No neural foraminal stenosis. No central canal stenosis.  C4-5: Interbody fusion. Moderate right foraminal narrowing. No left foraminal narrowing. No central canal stenosis.  C5-6: Broad-based disc bulge with a right paracentral disc protrusion deforming the spinal cord. Bilateral uncovertebral degenerative changes with moderate right foraminal stenosis. No left foraminal stenosis. Moderate spinal stenosis.  C6-7: Central disc protrusion slightly eccentric towards the right and mildly deforming the ventral cervical spinal cord. No neural foraminal stenosis. Mild spinal stenosis.  C7-T1: No significant disc bulge. No neural foraminal stenosis. No central canal stenosis.  IMPRESSION: 1. At C5-6 there is a broad-based disc bulge with a right paracentral disc protrusion deforming the spinal cord. Bilateral uncovertebral degenerative changes with moderate right foraminal stenosis. No left foraminal stenosis. Moderate spinal stenosis. 2. At C6-7 there is a central disc protrusion slightly eccentric towards the right and mildly deforming the ventral cervical spinal cord. Mild spinal stenosis.   Electronically Signed  By: Kathreen Devoid  On: 07/06/2015 11:43    CLINICAL DATA: Subsequent Treatment strategy for colon cancer.  EXAM: NUCLEAR MEDICINE PET SKULL BASE TO THIGH  TECHNIQUE: 13.9 mCi F-18 FDG was injected intravenously. Full-ring PET imaging was performed from the skull base to thigh after the radiotracer. CT data was obtained and used for attenuation correction and anatomic localization.  FASTING BLOOD GLUCOSE: Value: 131 mg/dl  COMPARISON: 06/23/2015  FINDINGS: NECK  No hypermetabolic lymph nodes in the neck.  CHEST  The heart size is normal. There is aortic atherosclerosis noted. Calcification within the RCA, LAD and left circumflex coronary arteries noted. There is no pleural effusion identified. There is a nodule within the posterior and medial right lower lobe which measures 6 mm. SUV max equals 5.23. The subpleural nodule referenced on previous CT is not visualized on today's examination.  ABDOMEN/PELVIS  No abnormal hypermetabolic activity within the liver, pancreas, adrenal glands, or spleen. No hypermetabolic lymph nodes in the abdomen or pelvis.  SKELETON  There  is a indeterminate focus of increased uptake localizing to the left femoral neck. SUV max equals 7.24. On the corresponding CT images no definitive lytic or sclerotic bone lesion identified.  IMPRESSION: 1. There is malignant range FDG uptake associated with the right lower lobe pulmonary nodule suspicious for either metastatic disease or primary pulmonary neoplasm. 2. Nonspecific focus of increased uptake localizing to the left femoral neck. No corresponding CT abnormality. Cannot rule out bone metastasis. If further imaging is clinically indicated then a MRI with contrast of the left hip may be helpful for further assessment. 3. Aortic atherosclerosis 4. Multi vessel coronary artery calcification.   Electronically Signed  By: Kerby Moors M.D.  On: 07/01/2015 15:40   PATHOLOGY:   N/A  ASSESSMENT/PLAN:   Pulmonary nodule New RLL pulmonary nodule that is hypermetabolic on PET imaging on 07/01/2015 with a history of Stage II adenocarcinoma of the colon.   I personally reviewed and went over radiographic studies with the patient.  The results are noted within this dictation.  Abnormality in left femur does not correlate with CT imaging.  We will not pursue this further at this time as he is asymptomatic, and suspicion is low.  Case discussed with IR, Dr. Geroge Baseman who agrees that the site of this pulmonary lesion is amenable to CT-guided biopsy by IR.  HOWEVER, after seeing the patient and learning of his neurological deficits and recent performance status change, aggressively pursing a biopsy given its 6 mm size and possibly interfering with his ability to pursue neurologic intervention and improve QOL may not be best course of action.   Labs today: CBC diff, CMET, CEA.  Will discuss case with Dr. [name] and see if he would be interested in pursuing SBRT for this small lesion and forego a biopsy.  The patient thinks he has an appointment with Dr. [name] at the end of this month. Patient also notes this would be his preferred option for dealing with the lung nodule at this point.   Patient is working with Dr. Scotty Nelson (PCP) to get involved with a neurosurgery.  Patient is advised that if we can help in this referral, we would be glad to do so.  He will return at the beginning of July.  In the interim, we will discuss with Dr. [name].  Adenocarcinoma of colon ([place]) Stage II (T3N0M0) adenocarcinoma having finished curative treatment in 2014-2015 consisting of partial colectomy on 10/04/2012 by Dr. [name] followed by 6 months worth of adjuvant chemotherapy consisting of FOLFOX with a change in treatment to infusional 5-FU for progressive peripheral neuropathy to finish 6 months worth of treatment by Dr. [name].  Oncology history developed. Last Colonoscopy in January 2016 with  Dr. Anthony Sar in [place].   Staging in [facility] problem list is completed with current data.   ORDERS PLACED FOR THIS ENCOUNTER: Orders Placed This Encounter  Procedures  . CBC with Differential  . Comprehensive metabolic panel  . CEA    All questions were answered. The patient knows to call the clinic with any problems, questions or concerns. We can certainly see the patient much sooner if necessary.  This note is electronically signed by: Molli Hazard, MD   07/15/2015 5:37 PM

## 2015-07-15 NOTE — Assessment & Plan Note (Addendum)
New RLL pulmonary nodule that is hypermetabolic on PET imaging on 07/01/2015 with a history of Stage II adenocarcinoma.  I personally reviewed and went over radiographic studies with the patient.  The results are noted within this dictation.  Abnormality in left femur does not correlate with CT imaging.  We will not pursue this further at this time as he is asymptomatic, and suspicion is low.  Case discussed with IR, Dr. Geroge Baseman who agrees that the site of this pulmonary lesion is amenable to CT-guided biopsy by IR.  HOWEVER, after seeing the patient and learning of his neurological deficits and recent performance status change, aggressively pursing a biopsy given its 6 mm size and possibly interfering with his ability to pursue neurologic intervention and improve QOL.  Labs today: CBC diff, CMET, CEA.  Will discuss case with Dr. [name] and see if he would be interested in pursuing SBRT for this small lesion and forego a biopsy.  The patient thinks he has an appointment with Dr. [name] at the end of this month.  Patient is working with Dr. Scotty Court (PCP) to get involved with a neurosurgery.  Patient is advised that if we can help in this referral, we would be glad to do so.  He will return at the beginning of July.  In the interim, we will discuss with Dr. [name].

## 2015-07-15 NOTE — Patient Instructions (Signed)
[place] at [facility] Discharge Instructions  RECOMMENDATIONS MADE BY THE CONSULTANT AND ANY TEST RESULTS WILL BE SENT TO YOUR REFERRING PHYSICIAN.  Labs today: CBC diff, CMP, CEA  Follow up first week in July with Dr. Whitney Muse  Thank you for choosing [place] at [facility] to provide your oncology and hematology care.  To afford each patient quality time with our provider, please arrive at least 15 minutes before your scheduled appointment time.   Beginning January 23rd 2017 lab work for the [facility] will be done in the  Main lab at [facility] on 1st floor. If you have a lab appointment with the [place] please come in thru the  Main Entrance and check in at the main information desk  You need to re-schedule your appointment should you arrive 10 or more minutes late.  We strive to give you quality time with our providers, and arriving late affects you and other patients whose appointments are after yours.  Also, if you no show three or more times for appointments you may be dismissed from the clinic at the providers discretion.     Again, thank you for choosing [facility]  Our hope is that these requests will decrease the amount of time that you wait before being seen by our physicians.       _____________________________________________________________  Should you have questions after your visit to [facility], please contact our office at [phone] between the hours of 8:30 a.m. and 4:30 p.m.  Voicemails left after 4:30 p.m. will not be returned until the following business day.  For prescription refill requests, have your pharmacy contact our office.         Resources For Cancer Patients and their Caregivers ? American Cancer Society: Can assist with transportation, wigs, general needs, runs Look Good Feel Better.        408 558 3115 ? Cancer Care: Provides financial assistance, online  support groups, medication/co-pay assistance.  1-800-813-HOPE (314)403-7547) ? [place] Assists [place] cancer patients and their families through emotional , educational and financial support.  779-804-6378 ? [place] DSS Where to apply for food stamps, Medicaid and utility assistance. 616-165-2613 ? [employer]: Transportation to medical appointments. 780-854-3272 ? Social Security Administration: May apply for disability if have a Stage IV cancer. (610)260-4804 (646)234-8248 ? [employer], Disability and Transit Services: Assists with nutrition, care and transit needs. [place] Support Programs: '@10RELATIVEDAYS'$ @ > Cancer Support Group  2nd Tuesday of the month 1pm-2pm, Journey Room  > Creative Journey  3rd Tuesday of the month 1130am-1pm, Journey Room  > Look Good Feel Better  1st Wednesday of the month 10am-12 noon, Journey Room (Call [place] to register [phone])

## 2015-07-16 LAB — CEA: CEA: 3.6 ng/mL (ref 0.0–4.7)

## 2015-07-21 ENCOUNTER — Other Ambulatory Visit: Payer: Self-pay | Admitting: Neurological Surgery

## 2015-07-21 DIAGNOSIS — M4712 Other spondylosis with myelopathy, cervical region: Secondary | ICD-10-CM | POA: Diagnosis not present

## 2015-07-21 DIAGNOSIS — Z6841 Body Mass Index (BMI) 40.0 and over, adult: Secondary | ICD-10-CM | POA: Diagnosis not present

## 2015-07-26 ENCOUNTER — Encounter (HOSPITAL_COMMUNITY): Payer: Self-pay | Admitting: *Deleted

## 2015-07-26 MED ORDER — DEXTROSE 5 % IV SOLN
3.0000 g | INTRAVENOUS | Status: AC
  Administered 2015-07-27: 3 g via INTRAVENOUS
  Filled 2015-07-26 (×3): qty 3000

## 2015-07-26 NOTE — Progress Notes (Signed)
Anesthesia Chart Review: SAME DAY WORK-UP.  Patient is a 75 year old male scheduled for C4-7 laminectomy with C4-T1 dorsal internal fixation and fusion on 07/27/15 by Dr. Cyndy Freeze.  History includes former smoker, HTN, DM2, colon cancer (Stage II adenocarcinoma) s/p partial colectomy 10/04/12 (Dr. [name]) and chemotherapy 09/29/12 (03/14/15 PET CT: hypermetabolic RLL nodule concerning for mets versus primary lung), DVT (post-op) '14, PE, OSA, back surgery, cholecystectomy, C3-5 ACDF 07/04/11 (Dr. 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  PCP is Dr. Matthias Hughs.  Hem-Onc was Dr. Audree Camel, last visit 07/08/15 ([facility]; Care Everywhere), but is now being followed at [facility] due to [facility] office closing. He was planning to refer patient to Rad-Onc Dr. [name] for consideration of stereotactic radiation therapy to the RLL lung lesion. He saw Robynn Pane, PA-C on 07/15/15. He discussed potential for CT guided biopsy by IR, but after Dr. Geroge Baseman learned of patient's neurological deficits and recent performance status change (can't more RLE, decreased RUE movement with BUE numbness) it appears he did not want to pursue biopsy due to possibly interfering with his ability to pursue neurologic intervention. Case was going to be discussed with Dr. [name] regarding considering SBRT for the small lesion and forego biopsy. I don't see an appointment scheduled with Dr. [name] yet, although there is an appointment with Dr. Whitney Muse for 08/13/15.  He is not routinely followed by cardiology, but was seen by Dr. Bronson Ing on 09/09/12 for a preoperative evaluation prior to colon resection. He had a normal stress test at that time. He denied CP, but reports chronic SOB.  Meds include ASA 81 mg, Lasix, Neurontin, insulin NPH, lisinopril, KCL.  09/13/12 Nuclear stress test [facility]): Normal nuclear  stress test. There is no scar or ischemia. EF 57%. This is a low risk scan.  09/11/12 Echo: Study Conclusions - Left ventricle: Technically limited study. The cavity size was normal. Wall thickness was increased in a pattern of mild LVH. The estimated ejection fraction was 60%. Regional wall motion abnormalities cannot be excluded. - Left atrium: The atrium was mildly dilated. - Right ventricle: The cavity size was normal. Systolic function was low normal.  07/01/15 PET Scan: IMPRESSION: 1. There is malignant range FDG uptake associated with the right lower lobe pulmonary nodule suspicious for either metastatic disease or primary pulmonary neoplasm. 2. Nonspecific focus of increased uptake localizing to the left femoral neck. No corresponding CT abnormality. Cannot rule out bone metastasis. If further imaging is clinically indicated then a MRI with contrast of the left hip may be helpful for further assessment. 3. Aortic atherosclerosis 4. Multi vessel coronary artery calcification.  Lab work on 07/15/15 noted. Cr 0.96. H/H 12.8/38.0. Glucose 118.   He will need an updated EKG.   Patient with cervical spondylosis with myelopathy and needs the above procedure. He does have a RLL lung nodule concerning for colon cancer mets versus primary lung carcinoma. He has pending follow-up with Dr. [name]. Discussed with anesthesiologist Dr. Linna Caprice. Patient could be potential for difficult airway due to size, prior cervical fusion, etc. May need a-line. Further evaluation by his anesthesiologist on the day of surgery to determine definitive anesthesia plan.    George Hugh [facility] Short Stay Center/Anesthesiology Phone [phone] 07/26/2015 2:08 PM

## 2015-07-26 NOTE — Progress Notes (Signed)
Mr Hunt states that he can't move right leg, patient is unable to walk- using wheel chair. Patient can move right arm some, not much- has numbness. Patient has numbness of  Left side also. Paient denies chest pain, does not see a cardiologist. Patient had cardiac work up in 2014- was not instructed to follow up.  Patient states that CBG was 110 this am and the lowest it has been is 90.  I instructed patient to take 7 units NPH this evening and 15 units NPH in am. Patient repeated nstructions to me.  I instructed patient to check CBG to check CBG and if it is less than 70 to treat it  1/2 cup of clear juice like apple juice or cranberry juice, or 1/2 cup of regular soda. (not cream soda).  Paient said he does not have clear juice, "but it wont be less than 70. I instructed patient to recheck CBG in 15 minutes and if CBG is not greater than 70, to  Call [phone] (pre- op). If it is before pre-op opens to retreat as before and recheck CBG in 15 minutes. I told patient to make note of time that liquid is taken and amount, that surgical time may have to be adjusted.

## 2015-07-27 ENCOUNTER — Encounter (HOSPITAL_COMMUNITY): Payer: Self-pay | Admitting: *Deleted

## 2015-07-27 ENCOUNTER — Other Ambulatory Visit: Payer: Self-pay

## 2015-07-27 ENCOUNTER — Inpatient Hospital Stay (HOSPITAL_COMMUNITY): Payer: Medicare Other | Admitting: Vascular Surgery

## 2015-07-27 ENCOUNTER — Inpatient Hospital Stay (HOSPITAL_COMMUNITY)
Admission: RE | Admit: 2015-07-27 | Discharge: 2015-07-31 | DRG: 472 | Disposition: A | Discharge status: Skilled Nursing Facility | Payer: Medicare Other | Source: Ambulatory Visit | Attending: Neurological Surgery | Admitting: Neurological Surgery

## 2015-07-27 ENCOUNTER — Inpatient Hospital Stay (HOSPITAL_COMMUNITY): Payer: Medicare Other

## 2015-07-27 ENCOUNTER — Encounter (HOSPITAL_COMMUNITY)
Admission: RE | Disposition: A | Discharge status: Skilled Nursing Facility | Payer: Self-pay | Source: Ambulatory Visit | Attending: Neurological Surgery

## 2015-07-27 DIAGNOSIS — C189 Malignant neoplasm of colon, unspecified: Secondary | ICD-10-CM | POA: Diagnosis not present

## 2015-07-27 DIAGNOSIS — Z86711 Personal history of pulmonary embolism: Secondary | ICD-10-CM

## 2015-07-27 DIAGNOSIS — M4712 Other spondylosis with myelopathy, cervical region: Principal | ICD-10-CM | POA: Diagnosis present

## 2015-07-27 DIAGNOSIS — Z794 Long term (current) use of insulin: Secondary | ICD-10-CM | POA: Diagnosis not present

## 2015-07-27 DIAGNOSIS — E1140 Type 2 diabetes mellitus with diabetic neuropathy, unspecified: Secondary | ICD-10-CM | POA: Diagnosis present

## 2015-07-27 DIAGNOSIS — Z79899 Other long term (current) drug therapy: Secondary | ICD-10-CM

## 2015-07-27 DIAGNOSIS — I5030 Unspecified diastolic (congestive) heart failure: Secondary | ICD-10-CM | POA: Diagnosis not present

## 2015-07-27 DIAGNOSIS — R200 Anesthesia of skin: Secondary | ICD-10-CM | POA: Diagnosis not present

## 2015-07-27 DIAGNOSIS — G959 Disease of spinal cord, unspecified: Secondary | ICD-10-CM | POA: Diagnosis present

## 2015-07-27 DIAGNOSIS — N189 Chronic kidney disease, unspecified: Secondary | ICD-10-CM | POA: Diagnosis not present

## 2015-07-27 DIAGNOSIS — Z9049 Acquired absence of other specified parts of digestive tract: Secondary | ICD-10-CM

## 2015-07-27 DIAGNOSIS — Z7982 Long term (current) use of aspirin: Secondary | ICD-10-CM | POA: Diagnosis not present

## 2015-07-27 DIAGNOSIS — D649 Anemia, unspecified: Secondary | ICD-10-CM | POA: Diagnosis present

## 2015-07-27 DIAGNOSIS — R279 Unspecified lack of coordination: Secondary | ICD-10-CM | POA: Diagnosis not present

## 2015-07-27 DIAGNOSIS — I10 Essential (primary) hypertension: Secondary | ICD-10-CM | POA: Diagnosis present

## 2015-07-27 DIAGNOSIS — Z85038 Personal history of other malignant neoplasm of large intestine: Secondary | ICD-10-CM | POA: Diagnosis not present

## 2015-07-27 DIAGNOSIS — G4730 Sleep apnea, unspecified: Secondary | ICD-10-CM | POA: Diagnosis present

## 2015-07-27 DIAGNOSIS — M4303 Spondylolysis, cervicothoracic region: Secondary | ICD-10-CM | POA: Diagnosis not present

## 2015-07-27 DIAGNOSIS — Z6841 Body Mass Index (BMI) 40.0 and over, adult: Secondary | ICD-10-CM | POA: Diagnosis not present

## 2015-07-27 DIAGNOSIS — Z981 Arthrodesis status: Secondary | ICD-10-CM

## 2015-07-27 DIAGNOSIS — I872 Venous insufficiency (chronic) (peripheral): Secondary | ICD-10-CM | POA: Diagnosis present

## 2015-07-27 DIAGNOSIS — R911 Solitary pulmonary nodule: Secondary | ICD-10-CM | POA: Diagnosis present

## 2015-07-27 DIAGNOSIS — Z86718 Personal history of other venous thrombosis and embolism: Secondary | ICD-10-CM | POA: Diagnosis not present

## 2015-07-27 DIAGNOSIS — G4733 Obstructive sleep apnea (adult) (pediatric): Secondary | ICD-10-CM | POA: Diagnosis present

## 2015-07-27 DIAGNOSIS — E1151 Type 2 diabetes mellitus with diabetic peripheral angiopathy without gangrene: Secondary | ICD-10-CM | POA: Diagnosis present

## 2015-07-27 DIAGNOSIS — J449 Chronic obstructive pulmonary disease, unspecified: Secondary | ICD-10-CM | POA: Diagnosis present

## 2015-07-27 DIAGNOSIS — R339 Retention of urine, unspecified: Secondary | ICD-10-CM | POA: Diagnosis present

## 2015-07-27 DIAGNOSIS — M4802 Spinal stenosis, cervical region: Secondary | ICD-10-CM | POA: Diagnosis present

## 2015-07-27 DIAGNOSIS — M4320 Fusion of spine, site unspecified: Secondary | ICD-10-CM | POA: Diagnosis not present

## 2015-07-27 DIAGNOSIS — Z9221 Personal history of antineoplastic chemotherapy: Secondary | ICD-10-CM

## 2015-07-27 DIAGNOSIS — Z993 Dependence on wheelchair: Secondary | ICD-10-CM | POA: Diagnosis not present

## 2015-07-27 DIAGNOSIS — M6281 Muscle weakness (generalized): Secondary | ICD-10-CM | POA: Diagnosis not present

## 2015-07-27 DIAGNOSIS — E1121 Type 2 diabetes mellitus with diabetic nephropathy: Secondary | ICD-10-CM | POA: Diagnosis not present

## 2015-07-27 DIAGNOSIS — M4710 Other spondylosis with myelopathy, site unspecified: Secondary | ICD-10-CM | POA: Diagnosis not present

## 2015-07-27 DIAGNOSIS — N401 Enlarged prostate with lower urinary tract symptoms: Secondary | ICD-10-CM | POA: Diagnosis not present

## 2015-07-27 DIAGNOSIS — Z419 Encounter for procedure for purposes other than remedying health state, unspecified: Secondary | ICD-10-CM

## 2015-07-27 DIAGNOSIS — R278 Other lack of coordination: Secondary | ICD-10-CM | POA: Diagnosis not present

## 2015-07-27 HISTORY — PX: POSTERIOR CERVICAL FUSION/FORAMINOTOMY: SHX5038

## 2015-07-27 HISTORY — DX: Acute embolism and thrombosis of unspecified deep veins of unspecified lower extremity: I82.409

## 2015-07-27 LAB — CBC
HCT: 37.8 % — ABNORMAL LOW (ref 39.0–52.0)
Hemoglobin: 12.2 g/dL — ABNORMAL LOW (ref 13.0–17.0)
MCH: 30.7 pg (ref 26.0–34.0)
MCHC: 32.3 g/dL (ref 30.0–36.0)
MCV: 95 fL (ref 78.0–100.0)
Platelets: 283 10*3/uL (ref 150–400)
RBC: 3.98 MIL/uL — ABNORMAL LOW (ref 4.22–5.81)
RDW: 13.8 % (ref 11.5–15.5)
WBC: 7.3 10*3/uL (ref 4.0–10.5)

## 2015-07-27 LAB — TYPE AND SCREEN
ABO/RH(D): A NEG
Antibody Screen: NEGATIVE

## 2015-07-27 LAB — BASIC METABOLIC PANEL
Anion gap: 7 (ref 5–15)
BUN: 24 mg/dL — ABNORMAL HIGH (ref 6–20)
CO2: 20 mmol/L — ABNORMAL LOW (ref 22–32)
Calcium: 8.9 mg/dL (ref 8.9–10.3)
Chloride: 109 mmol/L (ref 101–111)
Creatinine, Ser: 1.06 mg/dL (ref 0.61–1.24)
GFR calc Af Amer: >60 mL/min (ref >60)
GFR calc non Af Amer: >60 mL/min (ref >60)
Glucose, Bld: 102 mg/dL — ABNORMAL HIGH (ref 65–99)
Potassium: 4.6 mmol/L (ref 3.5–5.1)
Sodium: 136 mmol/L (ref 135–145)

## 2015-07-27 LAB — GLUCOSE, CAPILLARY
Glucose-Capillary: 110 mg/dL — ABNORMAL HIGH (ref 65–99)
Glucose-Capillary: 87 mg/dL (ref 65–99)
Glucose-Capillary: 173 mg/dL — ABNORMAL HIGH (ref 65–99)
Glucose-Capillary: 205 mg/dL — ABNORMAL HIGH (ref 65–99)
Glucose-Capillary: 185 mg/dL — ABNORMAL HIGH (ref 65–99)

## 2015-07-27 LAB — SURGICAL PCR SCREEN
MRSA, PCR: NEGATIVE
Staphylococcus aureus: NEGATIVE

## 2015-07-27 LAB — ABO/RH: ABO/RH(D): A NEG

## 2015-07-27 SURGERY — POSTERIOR CERVICAL FUSION/FORAMINOTOMY LEVEL 4
Anesthesia: General

## 2015-07-27 MED ORDER — HEMOSTATIC AGENTS (NO CHARGE) OPTIME
TOPICAL | Status: DC | PRN
  Administered 2015-07-27: 1 via TOPICAL

## 2015-07-27 MED ORDER — LIDOCAINE HCL (CARDIAC) 20 MG/ML IV SOLN
INTRAVENOUS | Status: DC | PRN
  Administered 2015-07-27: 100 mg via INTRAVENOUS

## 2015-07-27 MED ORDER — BUPIVACAINE LIPOSOME 1.3 % IJ SUSP
INTRAMUSCULAR | Status: DC | PRN
  Administered 2015-07-27: 20 mL

## 2015-07-27 MED ORDER — LIDOCAINE-EPINEPHRINE 1 %-1:100000 IJ SOLN
INTRAMUSCULAR | Status: DC | PRN
  Administered 2015-07-27: 30 mL

## 2015-07-27 MED ORDER — NYSTATIN 100000 UNIT/GM EX CREA
1.0000 "application " | TOPICAL_CREAM | Freq: Two times a day (BID) | CUTANEOUS | Status: DC
  Administered 2015-07-27 – 2015-07-31 (×8): 1 via TOPICAL
  Filled 2015-07-27: qty 15

## 2015-07-27 MED ORDER — ONDANSETRON HCL 4 MG/2ML IJ SOLN: 4.0000 mg | Freq: Once | INTRAMUSCULAR | Status: DC | PRN

## 2015-07-27 MED ORDER — OXYCODONE HCL 5 MG PO TABS
5.0000 mg | ORAL_TABLET | ORAL | Status: DC | PRN
  Administered 2015-07-27 – 2015-07-28 (×3): 5 mg via ORAL
  Filled 2015-07-27 (×4): qty 1

## 2015-07-27 MED ORDER — PHENOL 1.4 % MT LIQD: 1.0000 | OROMUCOSAL | Status: DC | PRN

## 2015-07-27 MED ORDER — 0.9 % SODIUM CHLORIDE (POUR BTL) OPTIME
TOPICAL | Status: DC | PRN
  Administered 2015-07-27: 1000 mL

## 2015-07-27 MED ORDER — BISACODYL 5 MG PO TBEC: 5.0000 mg | DELAYED_RELEASE_TABLET | Freq: Every day | ORAL | Status: DC | PRN

## 2015-07-27 MED ORDER — ONDANSETRON HCL 4 MG/2ML IJ SOLN
INTRAMUSCULAR | Status: DC | PRN
  Administered 2015-07-27: 4 mg via INTRAVENOUS

## 2015-07-27 MED ORDER — PROPOFOL 10 MG/ML IV BOLUS
INTRAVENOUS | Status: DC | PRN
Start: 2015-07-27 — End: 2015-07-27
  Administered 2015-07-27: 200 mg via INTRAVENOUS

## 2015-07-27 MED ORDER — FENTANYL CITRATE (PF) 100 MCG/2ML IJ SOLN: 25.0000 ug | INTRAMUSCULAR | Status: DC | PRN

## 2015-07-27 MED ORDER — ASPIRIN EC 81 MG PO TBEC
81.0000 mg | DELAYED_RELEASE_TABLET | Freq: Every day | ORAL | Status: DC
  Administered 2015-07-27 – 2015-07-31 (×5): 81 mg via ORAL
  Filled 2015-07-27 (×5): qty 1

## 2015-07-27 MED ORDER — ALBUMIN HUMAN 5 % IV SOLN
INTRAVENOUS | Status: AC
  Filled 2015-07-27: qty 250

## 2015-07-27 MED ORDER — PANTOPRAZOLE SODIUM 20 MG PO TBEC
20.0000 mg | DELAYED_RELEASE_TABLET | Freq: Every day | ORAL | Status: DC
  Administered 2015-07-28 – 2015-07-31 (×4): 20 mg via ORAL
  Filled 2015-07-27 (×6): qty 1

## 2015-07-27 MED ORDER — SODIUM CHLORIDE 0.9 % IV SOLN
250.0000 mL | INTRAVENOUS | Status: DC
Start: 2015-07-27 — End: 2015-07-31

## 2015-07-27 MED ORDER — THROMBIN 5000 UNITS EX SOLR
OROMUCOSAL | Status: DC | PRN
  Administered 2015-07-27 (×3): via TOPICAL

## 2015-07-27 MED ORDER — DOCUSATE SODIUM 100 MG PO CAPS
100.0000 mg | ORAL_CAPSULE | Freq: Two times a day (BID) | ORAL | Status: DC
  Administered 2015-07-27 – 2015-07-31 (×8): 100 mg via ORAL
  Filled 2015-07-27 (×8): qty 1

## 2015-07-27 MED ORDER — SODIUM CHLORIDE 0.9 % IV SOLN
INTRAVENOUS | Status: DC
  Administered 2015-07-27 – 2015-07-28 (×2): via INTRAVENOUS

## 2015-07-27 MED ORDER — POTASSIUM CHLORIDE CRYS ER 10 MEQ PO TBCR
10.0000 meq | EXTENDED_RELEASE_TABLET | Freq: Every day | ORAL | Status: DC
  Administered 2015-07-28 – 2015-07-31 (×4): 10 meq via ORAL
  Filled 2015-07-27 (×4): qty 1

## 2015-07-27 MED ORDER — SUCCINYLCHOLINE CHLORIDE 200 MG/10ML IV SOSY
PREFILLED_SYRINGE | INTRAVENOUS | Status: DC | PRN
  Administered 2015-07-27: 140 mg via INTRAVENOUS

## 2015-07-27 MED ORDER — SUGAMMADEX SODIUM 500 MG/5ML IV SOLN
INTRAVENOUS | Status: AC
  Filled 2015-07-27: qty 5

## 2015-07-27 MED ORDER — THROMBIN 5000 UNITS EX SOLR
CUTANEOUS | Status: DC | PRN
  Administered 2015-07-27 (×2): 5000 [IU] via TOPICAL

## 2015-07-27 MED ORDER — MUPIROCIN 2 % EX OINT
TOPICAL_OINTMENT | CUTANEOUS | Status: AC
  Administered 2015-07-27: 1
  Filled 2015-07-27: qty 22

## 2015-07-27 MED ORDER — METHOCARBAMOL 750 MG PO TABS
750.0000 mg | ORAL_TABLET | Freq: Four times a day (QID) | ORAL | Status: DC
  Administered 2015-07-27 – 2015-07-31 (×14): 750 mg via ORAL
  Filled 2015-07-27 (×14): qty 1

## 2015-07-27 MED ORDER — MIDAZOLAM HCL 5 MG/5ML IJ SOLN
INTRAMUSCULAR | Status: DC | PRN
  Administered 2015-07-27: 2 mg via INTRAVENOUS

## 2015-07-27 MED ORDER — SODIUM CHLORIDE 0.9 % IV SOLN
INTRAVENOUS | Status: DC | PRN
  Administered 2015-07-27: 14:00:00 via INTRAVENOUS

## 2015-07-27 MED ORDER — BUPIVACAINE HCL (PF) 0.5 % IJ SOLN
INTRAMUSCULAR | Status: DC | PRN
  Administered 2015-07-27: 30 mL

## 2015-07-27 MED ORDER — SODIUM CHLORIDE 0.9% FLUSH
3.0000 mL | Freq: Two times a day (BID) | INTRAVENOUS | Status: DC
  Administered 2015-07-28 – 2015-07-31 (×5): 3 mL via INTRAVENOUS

## 2015-07-27 MED ORDER — INSULIN NPH (HUMAN) (ISOPHANE) 100 UNIT/ML ~~LOC~~ SUSP: 15.0000 [IU] | Freq: Two times a day (BID) | SUBCUTANEOUS | Status: DC

## 2015-07-27 MED ORDER — OXYCODONE HCL ER 10 MG PO T12A
20.0000 mg | EXTENDED_RELEASE_TABLET | Freq: Two times a day (BID) | ORAL | Status: DC
  Administered 2015-07-27 – 2015-07-31 (×7): 20 mg via ORAL
  Filled 2015-07-27 (×8): qty 2

## 2015-07-27 MED ORDER — PHENYLEPHRINE HCL 10 MG/ML IJ SOLN
10.0000 mg | INTRAVENOUS | Status: DC | PRN
  Administered 2015-07-27: 40 ug/min via INTRAVENOUS

## 2015-07-27 MED ORDER — INSULIN ASPART 100 UNIT/ML ~~LOC~~ SOLN
0.0000 [IU] | Freq: Three times a day (TID) | SUBCUTANEOUS | Status: DC
  Administered 2015-07-28: 4 [IU] via SUBCUTANEOUS
  Administered 2015-07-28 – 2015-07-29 (×3): 3 [IU] via SUBCUTANEOUS
  Administered 2015-07-29: 7 [IU] via SUBCUTANEOUS
  Administered 2015-07-30: 3 [IU] via SUBCUTANEOUS
  Administered 2015-07-30: 4 [IU] via SUBCUTANEOUS
  Administered 2015-07-31 (×2): 3 [IU] via SUBCUTANEOUS

## 2015-07-27 MED ORDER — ACETAMINOPHEN 500 MG PO TABS
1000.0000 mg | ORAL_TABLET | Freq: Four times a day (QID) | ORAL | Status: DC
  Administered 2015-07-27 – 2015-07-31 (×14): 1000 mg via ORAL
  Filled 2015-07-27 (×15): qty 2

## 2015-07-27 MED ORDER — ALBUMIN HUMAN 25 % IV SOLN: 12.5000 g | Freq: Once | INTRAVENOUS | Status: DC

## 2015-07-27 MED ORDER — VANCOMYCIN HCL 1000 MG IV SOLR
INTRAVENOUS | Status: DC | PRN
  Administered 2015-07-27: 1000 mg via TOPICAL

## 2015-07-27 MED ORDER — FUROSEMIDE 20 MG PO TABS
20.0000 mg | ORAL_TABLET | Freq: Every day | ORAL | Status: DC
  Administered 2015-07-27 – 2015-07-31 (×5): 20 mg via ORAL
  Filled 2015-07-27 (×5): qty 1

## 2015-07-27 MED ORDER — CELECOXIB 200 MG PO CAPS
200.0000 mg | ORAL_CAPSULE | Freq: Two times a day (BID) | ORAL | Status: DC
  Administered 2015-07-27 – 2015-07-31 (×7): 200 mg via ORAL
  Filled 2015-07-27 (×8): qty 1

## 2015-07-27 MED ORDER — SENNA 8.6 MG PO TABS
1.0000 | ORAL_TABLET | Freq: Two times a day (BID) | ORAL | Status: DC
  Administered 2015-07-27 – 2015-07-31 (×8): 8.6 mg via ORAL
  Filled 2015-07-27 (×8): qty 1

## 2015-07-27 MED ORDER — VANCOMYCIN HCL 1000 MG IV SOLR
INTRAVENOUS | Status: AC
  Filled 2015-07-27: qty 1000

## 2015-07-27 MED ORDER — FENTANYL CITRATE (PF) 100 MCG/2ML IJ SOLN
INTRAMUSCULAR | Status: DC | PRN
  Administered 2015-07-27 (×2): 100 ug via INTRAVENOUS
  Administered 2015-07-27: 50 ug via INTRAVENOUS

## 2015-07-27 MED ORDER — GABAPENTIN 600 MG PO TABS
600.0000 mg | ORAL_TABLET | Freq: Three times a day (TID) | ORAL | Status: DC
  Administered 2015-07-27 – 2015-07-31 (×11): 600 mg via ORAL
  Filled 2015-07-27 (×11): qty 1

## 2015-07-27 MED ORDER — INSULIN NPH (HUMAN) (ISOPHANE) 100 UNIT/ML ~~LOC~~ SUSP
30.0000 [IU] | Freq: Every day | SUBCUTANEOUS | Status: DC
  Administered 2015-07-28 – 2015-07-31 (×4): 30 [IU] via SUBCUTANEOUS

## 2015-07-27 MED ORDER — FLEET ENEMA 7-19 GM/118ML RE ENEM: 1.0000 | ENEMA | Freq: Once | RECTAL | Status: DC | PRN

## 2015-07-27 MED ORDER — LISINOPRIL 20 MG PO TABS
40.0000 mg | ORAL_TABLET | Freq: Every day | ORAL | Status: DC
  Administered 2015-07-28 – 2015-07-31 (×2): 40 mg via ORAL
  Filled 2015-07-27 (×5): qty 2

## 2015-07-27 MED ORDER — MENTHOL 3 MG MT LOZG: 1.0000 | LOZENGE | OROMUCOSAL | Status: DC | PRN

## 2015-07-27 MED ORDER — INSULIN NPH (HUMAN) (ISOPHANE) 100 UNIT/ML ~~LOC~~ SUSP
15.0000 [IU] | Freq: Every day | SUBCUTANEOUS | Status: DC
  Administered 2015-07-27 – 2015-07-30 (×4): 15 [IU] via SUBCUTANEOUS
  Filled 2015-07-27: qty 10

## 2015-07-27 MED ORDER — CEFAZOLIN SODIUM 1-5 GM-% IV SOLN
1.0000 g | Freq: Three times a day (TID) | INTRAVENOUS | Status: AC
  Administered 2015-07-27 – 2015-07-28 (×2): 1 g via INTRAVENOUS
  Filled 2015-07-27 (×2): qty 50

## 2015-07-27 MED ORDER — SODIUM CHLORIDE 0.9% FLUSH: 3.0000 mL | INTRAVENOUS | Status: DC | PRN

## 2015-07-27 MED ORDER — BUPIVACAINE LIPOSOME 1.3 % IJ SUSP
20.0000 mL | INTRAMUSCULAR | Status: AC
  Filled 2015-07-27: qty 20

## 2015-07-27 MED ORDER — INSULIN ASPART 100 UNIT/ML ~~LOC~~ SOLN: 0.0000 [IU] | SUBCUTANEOUS | Status: DC

## 2015-07-27 MED ORDER — ROCURONIUM BROMIDE 100 MG/10ML IV SOLN
INTRAVENOUS | Status: DC | PRN
  Administered 2015-07-27 (×3): 10 mg via INTRAVENOUS
  Administered 2015-07-27: 40 mg via INTRAVENOUS
  Administered 2015-07-27: 10 mg via INTRAVENOUS

## 2015-07-27 MED ORDER — BACITRACIN ZINC 500 UNIT/GM EX OINT
TOPICAL_OINTMENT | CUTANEOUS | Status: DC | PRN
  Administered 2015-07-27: 1 via TOPICAL

## 2015-07-27 MED ORDER — FENTANYL CITRATE (PF) 250 MCG/5ML IJ SOLN
INTRAMUSCULAR | Status: AC
  Filled 2015-07-27: qty 5

## 2015-07-27 MED ORDER — ALBUMIN HUMAN 5 % IV SOLN
12.5000 g | Freq: Once | INTRAVENOUS | Status: AC
  Administered 2015-07-27: 12.5 g via INTRAVENOUS

## 2015-07-27 MED ORDER — LACTATED RINGERS IV SOLN
INTRAVENOUS | Status: DC
  Administered 2015-07-27 (×3): via INTRAVENOUS

## 2015-07-27 MED ORDER — ONDANSETRON HCL 4 MG/2ML IJ SOLN
4.0000 mg | INTRAMUSCULAR | Status: DC | PRN
  Administered 2015-07-28: 4 mg via INTRAVENOUS
  Filled 2015-07-27: qty 2

## 2015-07-27 MED ORDER — LACTATED RINGERS IV SOLN
INTRAVENOUS | Status: DC | PRN
  Administered 2015-07-27: 15:00:00 via INTRAVENOUS

## 2015-07-27 MED ORDER — SUGAMMADEX SODIUM 200 MG/2ML IV SOLN
INTRAVENOUS | Status: DC | PRN
  Administered 2015-07-27: 258.6 mg via INTRAVENOUS

## 2015-07-27 MED ORDER — SODIUM CHLORIDE 0.9 % IR SOLN
Status: DC | PRN
  Administered 2015-07-27: 16:00:00

## 2015-07-27 SURGICAL SUPPLY — 87 items
APPLICATOR CHLORAPREP 3ML ORNG (MISCELLANEOUS) ×3 IMPLANT
BENZOIN TINCTURE PRP APPL 2/3 (GAUZE/BANDAGES/DRESSINGS) ×6 IMPLANT
BIT DRILL VUEPOINT II (BIT) ×1 IMPLANT
BLADE CLIPPER SURG (BLADE) ×6 IMPLANT
BLADE ULTRA TIP 2M (BLADE) IMPLANT
BONE MATRIX OSTEOCEL PRO MED (Bone Implant) ×6 IMPLANT
BUR MATCHSTICK NEURO 3.0 LAGG (BURR) ×3 IMPLANT
BUR PRECISION FLUTE 5.0 (BURR) ×3 IMPLANT
CANISTER SUCT 3000ML PPV (MISCELLANEOUS) ×3 IMPLANT
CLOSURE WOUND 1/2 X4 (GAUZE/BANDAGES/DRESSINGS)
COVER BACK TABLE 60X90IN (DRAPES) ×3 IMPLANT
DECANTER SPIKE VIAL GLASS SM (MISCELLANEOUS) ×3 IMPLANT
DERMABOND ADVANCED (GAUZE/BANDAGES/DRESSINGS) ×2
DERMABOND ADVANCED .7 DNX12 (GAUZE/BANDAGES/DRESSINGS) ×1 IMPLANT
DRAPE C-ARM 42X72 X-RAY (DRAPES) ×6 IMPLANT
DRAPE C-ARMOR (DRAPES) ×3 IMPLANT
DRAPE MICROSCOPE LEICA (MISCELLANEOUS) IMPLANT
DRAPE POUCH INSTRU U-SHP 10X18 (DRAPES) ×3 IMPLANT
DRILL BIT VUEPOINT II (BIT) ×2
DRSG OPSITE 4X5.5 SM (GAUZE/BANDAGES/DRESSINGS) ×6 IMPLANT
DRSG OPSITE POSTOP 4X8 (GAUZE/BANDAGES/DRESSINGS) ×3 IMPLANT
DRSG PAD ABDOMINAL 8X10 ST (GAUZE/BANDAGES/DRESSINGS) IMPLANT
ELECT BLADE 4.0 EZ CLEAN MEGAD (MISCELLANEOUS) ×3
ELECT REM PT RETURN 9FT ADLT (ELECTROSURGICAL) ×3
ELECTRODE BLDE 4.0 EZ CLN MEGD (MISCELLANEOUS) ×1 IMPLANT
ELECTRODE REM PT RTRN 9FT ADLT (ELECTROSURGICAL) ×1 IMPLANT
EVACUATOR 1/8 PVC DRAIN (DRAIN) ×3 IMPLANT
GAUZE SPONGE 4X4 12PLY STRL (GAUZE/BANDAGES/DRESSINGS) ×3 IMPLANT
GAUZE SPONGE 4X4 16PLY XRAY LF (GAUZE/BANDAGES/DRESSINGS) IMPLANT
GLOVE BIOGEL PI IND STRL 7.5 (GLOVE) ×1 IMPLANT
GLOVE BIOGEL PI INDICATOR 7.5 (GLOVE) ×2
GLOVE EXAM NITRILE LRG STRL (GLOVE) IMPLANT
GLOVE EXAM NITRILE MD LF STRL (GLOVE) IMPLANT
GLOVE EXAM NITRILE XL STR (GLOVE) IMPLANT
GLOVE EXAM NITRILE XS STR PU (GLOVE) IMPLANT
GLOVE SS BIOGEL STRL SZ 7 (GLOVE) ×1 IMPLANT
GLOVE SUPERSENSE BIOGEL SZ 7 (GLOVE) ×2
GOWN STRL REUS W/ TWL LRG LVL3 (GOWN DISPOSABLE) IMPLANT
GOWN STRL REUS W/ TWL XL LVL3 (GOWN DISPOSABLE) IMPLANT
GOWN STRL REUS W/TWL 2XL LVL3 (GOWN DISPOSABLE) IMPLANT
GOWN STRL REUS W/TWL LRG LVL3 (GOWN DISPOSABLE)
GOWN STRL REUS W/TWL XL LVL3 (GOWN DISPOSABLE)
HEMOSTAT POWDER KIT SURGIFOAM (HEMOSTASIS) ×6 IMPLANT
HEMOSTAT POWDER SURGIFOAM 1G (HEMOSTASIS) ×3 IMPLANT
HEMOSTAT SURGICEL 2X14 (HEMOSTASIS) IMPLANT
KIT BASIN OR (CUSTOM PROCEDURE TRAY) ×3 IMPLANT
KIT ROOM TURNOVER OR (KITS) ×3 IMPLANT
MARKER SKIN DUAL TIP RULER LAB (MISCELLANEOUS) ×3 IMPLANT
NEEDLE HYPO 18GX1.5 BLUNT FILL (NEEDLE) IMPLANT
NEEDLE HYPO 21X1.5 SAFETY (NEEDLE) ×6 IMPLANT
NEEDLE HYPO 25X1 1.5 SAFETY (NEEDLE) ×3 IMPLANT
NEEDLE SPNL 22GX3.5 QUINCKE BK (NEEDLE) ×3 IMPLANT
NS IRRIG 1000ML POUR BTL (IV SOLUTION) ×3 IMPLANT
PACK LAMINECTOMY NEURO (CUSTOM PROCEDURE TRAY) ×3 IMPLANT
PACK UNIVERSAL I (CUSTOM PROCEDURE TRAY) ×3 IMPLANT
PATTIES SURGICAL .5X1.5 (GAUZE/BANDAGES/DRESSINGS) ×3 IMPLANT
PIN MAYFIELD SKULL DISP (PIN) ×3 IMPLANT
ROD 120MM (Rod) ×2 IMPLANT
ROD SPNL 240X3.5XNS LF TI (Rod) ×1 IMPLANT
RUBBERBAND STERILE (MISCELLANEOUS) IMPLANT
SCREW MA MM 3.5X14 (Screw) ×18 IMPLANT
SCREW SET THREADED (Screw) ×30 IMPLANT
SCREW VUEPOINT II 4.5X26MM MA (Screw) ×6 IMPLANT
SCREW VUEPOINT II 4.5X28MM MA (Screw) ×6 IMPLANT
SEALER BIPOLAR AQUA 2.3 (INSTRUMENTS) ×3 IMPLANT
SPONGE INTESTINAL PEANUT (DISPOSABLE) ×3 IMPLANT
SPONGE NEURO XRAY DETECT 1X3 (DISPOSABLE) ×6 IMPLANT
SPONGE SURGIFOAM ABS GEL SZ50 (HEMOSTASIS) ×3 IMPLANT
STAPLER SKIN PROX WIDE 3.9 (STAPLE) ×3 IMPLANT
STRIP CLOSURE SKIN 1/2X4 (GAUZE/BANDAGES/DRESSINGS) IMPLANT
STRIP SURGICAL 1 X 6 IN (GAUZE/BANDAGES/DRESSINGS) IMPLANT
STRIP SURGICAL 1/2 X 6 IN (GAUZE/BANDAGES/DRESSINGS) IMPLANT
STRIP SURGICAL 1/4 X 6 IN (GAUZE/BANDAGES/DRESSINGS) IMPLANT
STRIP SURGICAL 3/4 X 6 IN (GAUZE/BANDAGES/DRESSINGS) IMPLANT
SUT VIC AB 0 CT1 18XCR BRD8 (SUTURE) ×2 IMPLANT
SUT VIC AB 0 CT1 8-18 (SUTURE) ×4
SUT VIC AB 2-0 CT1 18 (SUTURE) ×6 IMPLANT
SUT VIC AB 3-0 SH 8-18 (SUTURE) ×6 IMPLANT
SUT VIC AB 4-0 PS2 27 (SUTURE) ×3 IMPLANT
SYR 20CC LL (SYRINGE) ×3 IMPLANT
SYR 30ML LL (SYRINGE) ×3 IMPLANT
SYR 3ML LL SCALE MARK (SYRINGE) IMPLANT
TOWEL OR 17X24 6PK STRL BLUE (TOWEL DISPOSABLE) ×3 IMPLANT
TOWEL OR 17X26 10 PK STRL BLUE (TOWEL DISPOSABLE) ×3 IMPLANT
TRAY FOLEY W/METER SILVER 16FR (SET/KITS/TRAYS/PACK) ×3 IMPLANT
UNDERPAD 30X30 INCONTINENT (UNDERPADS AND DIAPERS) ×3 IMPLANT
WATER STERILE IRR 1000ML POUR (IV SOLUTION) ×3 IMPLANT

## 2015-07-27 NOTE — Progress Notes (Signed)
No changes All questions answered Ready for OR

## 2015-07-27 NOTE — Progress Notes (Signed)
Pt transferred to unit from PACU via RN x 1. Pt alert and oriented upon arrival. No complaints of pain or discomfort. No signs or symptoms of acute distress. Pt oriented to unit as well as unit procedures. Pt resting in bed at lowest position, bed alarm on, call light in reach. Vital signs within normal limits. Honeycomb dressing clean dry and intact, as well hemovac drain. Will continue to monitor. Fortino Sic, RN, BSN 07/27/2015 8:02 PM

## 2015-07-27 NOTE — Anesthesia Postprocedure Evaluation (Deleted)
Anesthesia Post Note  Patient: Gary Nelson  Procedure(s) Performed: Procedure(s) (LRB): Cervical four to Cervical seven Laminectomy with Cervical four to Thoracic one Dorsal internal fixation and fusion (N/A)  Anesthesia Post Evaluation  Last Vitals:  Filed Vitals:   07/27/15 1714 07/27/15 1715  BP:    Pulse:  96  Temp: 36 C   Resp:      Last Pain: There were no vitals filed for this visit.               Catalina Gravel

## 2015-07-27 NOTE — H&P (Signed)
Dear Dr. Scotty Court:           I had the pleasure of seeing your patient, Gary Nelson, in my office today.  Thank you for this consultation.  Should you have any questions or concerns regarding my evaluation, please feel free to contact me at anytime.  This 75 year old male presents for numbness.  History of Present Illness: 1.  numbness    I saw Gary Nelson, who prefers to be called Gary Nelson, today in clinic.  He complains of progressive weakness and numbness in his hands and lower extremities that has been going on for several months.  He states that it started gradually and has progressed.  He has been wheelchair-bound since the beginning of May.  He doesn't have much pain.  He denies bowel or bladder difficulty.  He has not been to physical therapy.  He does not take NSAIDs.  He also has multiple medical problems which include COPD, diabetes, hypertension, and morbid obesity.        PAST MEDICAL/SURGICAL HISTORY   (Detailed)  Disease/disorder Onset Date Management Date Comments    Right femur surgery  CRR 07/21/2015 -    Cholecystectomy      Cataract extraction  CRR 07/21/2015 - Bilateral    Spinal fusion, cervical  CRR 07/21/2015 -    Colon surgery  CRR 07/21/2015 -  Cancer, colon    CRR 07/21/2015 -  COPD      Depression      Diabetes type 1    CRR 07/21/2015 -     PAST MEDICAL HISTORY, SURGICAL HISTORY, FAMILY HISTORY, SOCIAL HISTORY AND REVIEW OF SYSTEMS I have reviewed the patient's past medical, surgical, family and social history as well as the comprehensive review of systems as included on the [facility] history form dated 07/21/2015, which I have signed.  Family History  (Detailed) Relationship Family Member Name Deceased Age at Death Condition Onset Age Cause of Death      Family history of Cancer, unknown  N      Family history of Hypertension  N  Father  Y  Mental illness  Y  Father  Y  Dementia  Y  Mother  Y  Heart disease  Y  Mother  Y   Dementia  Y    SOCIAL HISTORY  (Detailed) Tobacco use reviewed. Preferred language is [place].   Smoking status: Never smoker.  SMOKING STATUS Use Status Type Smoking Status Usage Per Day Years Used Total Pack Years  no/never  Never smoker       HOME ENVIRONMENT/SAFETY  The Patient has fallen 5 times in the last year.  The fall(s) resulted in injury.  Details: Bruises.       MEDICATIONS(added, continued or stopped this visit): Started Medication Directions Instruction Stopped   aspirin 325 mg tablet take 1 tablet by oral route  every day     clotrimazole-betamethasone 1 %-0.05 % topical cream apply by topical route 2 times every day to the affected area     CPAP  INHALATION use as directed     furosemide 20 mg tablet take 1/2  tablet by oral route  every day     gabapentin 300 mg capsule take 1 capsule by oral route 2 times every day     Humalog 100 unit/mL subcutaneous solution inject 30units in the morning and 15 units in the evning by subcutaneous route per prescriber's instructions.     lisinopril 40 mg tablet take  1 tablet by oral route  every day       ALLERGIES: Ingredient Reaction Medication Name Comment  NO KNOWN ALLERGIES     No known allergies.    Vitals Date Temp F BP Pulse Ht In Wt Lb BMI BSA Pain Score  07/21/2015  114/70 98 65 285 47.43  0/10     PHYSICAL EXAM General Level of Distress: no acute distress Overall Appearance: normal    Respiratory Lungs: Breathing comfortably  Musculoskeletal Gait and Station: normal  Right Left Upper Extremity Muscle Strength: moderate weakness moderate weakness Lower Extremity Muscle Strength: severe weakness severe weakness Upper Extremity Muscle Tone:  normal normal Lower Extremity Muscle Tone: normal normal  Motor Strength Upper and lower extremity motor strength was tested in the clinically pertinent muscles. Any abnormal findings will be noted below.   Right Left Grip: 4-/5 4-/5 Finger  Extensor: 4-/5 4-/5 Hip Flexor: 2/5 2/5 Knee Extensor: 2/5 2/5 Knee Flexor: 3/5 3/5 Tib Anterior: 3/5 3/5 EHL: 3/5 3/5 Medial Gastroc: 3/5 3/5   Deep Tendon Reflexes  Right Left Biceps: decrease decrease Triceps: decrease decrease Brachiloradialis: decrease decrease Patellar: absent absent Achilles: absent absent  Sensory Sensation was tested at C2 to T1 and L1 to S1.   Motor and other Tests    Right Left Hoffman's: present  Clonus: normal normal     DIAGNOSTIC RESULTS I have independently reviewed a noncontrasted cervical MR from Jul 06, 2015.  He has normal alignment and maintenance of the normal cervical lordosis.  He has prior fusion anteriorly from C3 to C5.  There is substantial artifact and I can't tell if he is fused across these disc spaces.  He has severe stenosis due to disc bulges as well as ligamentous hypertrophy from C5 to C7.  There is severe spinal cord compression.  I obtained AP and lateral cervical radiographs.  These show what appears to be solid fusion at C3 C4 but not at C4 C5.    IMPRESSION Gary Nelson is a 75 year old Caucasian male who has spondylosis with myelopathy.  He has profound neurological deficits as described above.  I have recommended C4 to C7 laminectomy for decompression as well as C4 to T1 dorsal internal fixation and fusion.We had a long discussion, in language he understands, about the details of the procedure and its risks and benefits.  We also discussed alternatives to the procedure and the risks and benefits of those.  He asked appropriate questions and wishes to proceed with surgery.  In addition to the usual risks I have explained to him that there is a substantial risk of infection.  I have explained to them that there is a substantial risk that he will not see any neurological improvement after this.  I have prepared him for the fact that I think he will be in the hospital for 3-4 days and then will have a prolonged period of  rehabilitation either in inpatient rehabilitation or in a skilled nursing facility.  He wishes to proceed  Completed Orders (this encounter) Order Details Dear Dr. Scotty Court:           I had the pleasure of seeing your patient, Gary Nelson, in my office today.  Thank you for this consultation.  Should you have any questions or concerns regarding my evaluation, please feel free to contact me at anytime.  This 75 year old male presents for numbness.  History of Present Illness: 1.  numbness    I saw Gary Nelson, who prefers  to be called Gary Nelson, today in clinic.  He complains of progressive weakness and numbness in his hands and lower extremities that has been going on for several months.  He states that it started gradually and has progressed.  He has been wheelchair-bound since the beginning of May.  He doesn't have much pain.  He denies bowel or bladder difficulty.  He has not been to physical therapy.  He does not take NSAIDs.  He also has multiple medical problems which include COPD, diabetes, hypertension, and morbid obesity.        PAST MEDICAL/SURGICAL HISTORY   (Detailed)  Disease/disorder Onset Date Management Date Comments    Right femur surgery  CRR 07/21/2015 -    Cholecystectomy      Cataract extraction  CRR 07/21/2015 - Bilateral    Spinal fusion, cervical  CRR 07/21/2015 -    Colon surgery  CRR 07/21/2015 -  Cancer, colon    CRR 07/21/2015 -  COPD      Depression      Diabetes type 1    CRR 07/21/2015 -     PAST MEDICAL HISTORY, SURGICAL HISTORY, FAMILY HISTORY, SOCIAL HISTORY AND REVIEW OF SYSTEMS I have reviewed the patient's past medical, surgical, family and social history as well as the comprehensive review of systems as included on the [facility] history form dated 07/21/2015, which I have signed.  Family History  (Detailed) Relationship Family Member Name Deceased Age at Death Condition Onset Age Cause of Death      Family history  of Cancer, unknown  N      Family history of Hypertension  N  Father  Y  Mental illness  Y  Father  Y  Dementia  Y  Mother  Y  Heart disease  Y  Mother  Y  Dementia  Y    SOCIAL HISTORY  (Detailed) Tobacco use reviewed. Preferred language is [place].   Smoking status: Never smoker.  SMOKING STATUS Use Status Type Smoking Status Usage Per Day Years Used Total Pack Years  no/never  Never smoker       HOME ENVIRONMENT/SAFETY  The Patient has fallen 5 times in the last year.  The fall(s) resulted in injury.  Details: Bruises.       MEDICATIONS(added, continued or stopped this visit): Started Medication Directions Instruction Stopped   aspirin 325 mg tablet take 1 tablet by oral route  every day     clotrimazole-betamethasone 1 %-0.05 % topical cream apply by topical route 2 times every day to the affected area     CPAP  INHALATION use as directed     furosemide 20 mg tablet take 1/2  tablet by oral route  every day     gabapentin 300 mg capsule take 1 capsule by oral route 2 times every day     Humalog 100 unit/mL subcutaneous solution inject 30units in the morning and 15 units in the evning by subcutaneous route per prescriber's instructions.     lisinopril 40 mg tablet take 1 tablet by oral route  every day       ALLERGIES: Ingredient Reaction Medication Name Comment  NO KNOWN ALLERGIES     No known allergies.    Vitals Date Temp F BP Pulse Ht In Wt Lb BMI BSA Pain Score  07/21/2015  114/70 98 65 285 47.43  0/10     PHYSICAL EXAM General Level of Distress: no acute distress Overall Appearance: normal    Respiratory  Lungs: Breathing comfortably  Musculoskeletal Gait and Station: normal  Right Left Upper Extremity Muscle Strength: moderate weakness moderate weakness Lower Extremity Muscle Strength: severe weakness severe weakness Upper Extremity Muscle Tone:  normal normal Lower Extremity Muscle Tone: normal normal  Motor Strength Upper and lower  extremity motor strength was tested in the clinically pertinent muscles. Any abnormal findings will be noted below.   Right Left Grip: 4-/5 4-/5 Finger Extensor: 4-/5 4-/5 Hip Flexor: 2/5 2/5 Knee Extensor: 2/5 2/5 Knee Flexor: 3/5 3/5 Tib Anterior: 3/5 3/5 EHL: 3/5 3/5 Medial Gastroc: 3/5 3/5   Deep Tendon Reflexes  Right Left Biceps: decrease decrease Triceps: decrease decrease Brachiloradialis: decrease decrease Patellar: absent absent Achilles: absent absent  Sensory Sensation was tested at C2 to T1 and L1 to S1.   Motor and other Tests    Right Left Hoffman's: present  Clonus: normal normal     DIAGNOSTIC RESULTS I have independently reviewed a noncontrasted cervical MR from Jul 06, 2015.  He has normal alignment and maintenance of the normal cervical lordosis.  He has prior fusion anteriorly from C3 to C5.  There is substantial artifact and I can't tell if he is fused across these disc spaces.  He has severe stenosis due to disc bulges as well as ligamentous hypertrophy from C5 to C7.  There is severe spinal cord compression.  I obtained AP and lateral cervical radiographs.  These show what appears to be solid fusion at C3 C4 but not at C4 C5.    IMPRESSION Gary Nelson is a 75 year old Caucasian male who has spondylosis with myelopathy.  He has profound neurological deficits as described above.  I have recommended C4 to C7 laminectomy for decompression as well as C4 to T1 dorsal internal fixation and fusion.We had a long discussion, in language he understands, about the details of the procedure and its risks and benefits.  We also discussed alternatives to the procedure and the risks and benefits of those.  He asked appropriate questions and wishes to proceed with surgery.  In addition to the usual risks I have explained to him that there is a substantial risk of infection.  I have explained to them that there is a substantial risk that he will not see any neurological  improvement after this.  I have prepared him for the fact that I think he will be in the hospital for 3-4 days and then will have a prolonged period of rehabilitation either in inpatient rehabilitation or in a skilled nursing facility.  He wishes to proceed  Completed Orders (this encounter) Order Details Reason Side Interpretation Result Initial Treatment Date Region  Dietary management education, guidance, and counseling Eat a well rounded diet high in fruit and vegetables        Cervical Spine- AP/Lat C/S AP, LAT  and [facility]     07/21/2015    Assessment/Plan # Detail Type Description   1. Assessment Cervical spondylosis with myelopathy (M47.12).       2. Assessment Body mass index (BMI) 45.0-49.9, adult (Z68.42).   Plan Orders Today's instructions / counseling include(s) Dietary management education, guidance, and counseling.         Pain Assessment/Treatment Pain Scale: 0/10. Method: Numeric Pain Intensity Scale. Location: Arms, Legs. Onset: 06/08/2015. Duration: varies. Quality: No pain.  Fall Risk Plan The Patient has fallen 5 times in the last year. The fall(s) resulted in injury. Details: Bruises. Falls risk follow-up plan of care: Assisted devices: Advised patient to use safety measures  when available.  C4 to C7 laminectomy for decompression, C4 to T1 dorsal internal fixation and fusion Reason Side Interpretation Result Initial Treatment Date Region  Dietary management education, guidance, and counseling Eat a well rounded diet high in fruit and vegetables        Cervical Spine- AP/Lat C/S AP, LAT  and [facility]     07/21/2015    Assessment/Plan # Detail Type Description   1. Assessment Cervical spondylosis with myelopathy (M47.12).       2. Assessment Body mass index (BMI) 45.0-49.9, adult (Z68.42).   Plan Orders Today's instructions / counseling include(s) Dietary management education, guidance, and counseling.         Pain Assessment/Treatment Pain Scale:  0/10. Method: Numeric Pain Intensity Scale. Location: Arms, Legs. Onset: 06/08/2015. Duration: varies. Quality: No pain.  Fall Risk Plan The Patient has fallen 5 times in the last year. The fall(s) resulted in injury. Details: Bruises. Falls risk follow-up plan of care: Assisted devices: Advised patient to use safety measures when available.  C4 to C7 laminectomy for decompression, C4 to T1 dorsal internal fixation and fusion

## 2015-07-27 NOTE — Anesthesia Postprocedure Evaluation (Signed)
Anesthesia Post Note  Patient: ANEUDY CHAMPLAIN  Procedure(s) Performed: Procedure(s) (LRB): Cervical four to Cervical seven Laminectomy with Cervical four to Thoracic one Dorsal internal fixation and fusion (N/A)  Patient location during evaluation: PACU Anesthesia Type: General Level of consciousness: sedated Pain management: pain level controlled Vital Signs Assessment: post-procedure vital signs reviewed and stable Respiratory status: spontaneous breathing and respiratory function stable Cardiovascular status: stable Anesthetic complications: no    Last Vitals:  Filed Vitals:   07/27/15 1856 07/27/15 1904  BP:  98/43  Pulse: 77 76  Temp:  36.7 C  Resp: 15 24    Last Pain:  Filed Vitals:   07/27/15 1905  PainSc: 0-No pain                 Jaedan Huttner DANIEL

## 2015-07-27 NOTE — Transfer of Care (Signed)
Immediate Anesthesia Transfer of Care Note  Patient: Gary Nelson  Procedure(s) Performed: Procedure(s) with comments: Cervical four to Cervical seven Laminectomy with Cervical four to Thoracic one Dorsal internal fixation and fusion (N/A) - C4 to C7 Laminectomy with C4 to T1 Dorsal internal fixation and fusion  Patient Location: PACU  Anesthesia Type:General  Level of Consciousness: awake  Airway & Oxygen Therapy: Patient Spontanous Breathing and Patient connected to nasal cannula oxygen  Post-op Assessment: Report given to RN and Post -op Vital signs reviewed and stable  Post vital signs: Reviewed and stable  Last Vitals:  Filed Vitals:   07/27/15 0921 07/27/15 1714  BP: 141/68   Pulse: 103   Temp: 37.1 C 36 C  Resp: 18     Last Pain: There were no vitals filed for this visit.    Patients Stated Pain Goal: 3 (38/38/18 4037)  Complications: No apparent anesthesia complications

## 2015-07-27 NOTE — Progress Notes (Signed)
Dr. Cyndy Freeze notified about SBP in the 80's, he said as long as patient is awake and talking, he's ok with the pressure. Continue to monitor every 2 hours, will let the 50M RN know.

## 2015-07-27 NOTE — Op Note (Signed)
07/27/2015  5:17 PM  PATIENT:  Gary Nelson  75 y.o. 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  He has developed progressive weakness.  I recommended the above operation. Patient understood the risks, benefits, and alternatives and potential outcomes and wished to proceed.  PROCEDURE DETAILS: After smooth induction of general endotracheal anesthesia the patient was placed in [place] pins and turned prone on the operating table. He was positioned in reverse Trendelenburg with his knees flexed and head was fixated to the operative table. The hair in the suboccipital region was clipped. The patient was prepped and draped in the usual sterile fashion.  A midline incision was made from below the inion to approximately T1. A subperiosteal dissection was used to expose the spine from C4-T1 with extreme caution taken to not expose the C3-4 joints or the T1-T2 joints. C4 was identified using fluoroscopy. Then lateral mass screws were placed in C4-C6 bilaterally using anatomic landmarks. Wide laminectomies were performed from C4 to C7. C7 and T1 pedicle screws were placed using anatomic landmarks and the ability to directly palpate the pedicle on each side.  A rod was placed within the tulip heads on each side and secured with set screw caps. The lateral masses at each level as well as the remaining lamina at T1 was decorticated with a high-speed drill. Careful attention was paid to decorticate the surfaces of the facets between the fused levels. The wound was  vigorously irrigated with antibiotic irrigation. Fusion substrate was inserted along the decorticated area which consisted of locally harvested autograft as well as and morcellized allograft.  The setscrews were finally tightened. Vancomycin powder was placed in the wound.  A two limbed medium hemovac drain was passed below the fascia.  The wound was closed in multiple layers with interrupted sutures. Dermabond was used to close the skin. The drapes were then taken down patient was returned to the prone position in the [place] pins were removed.  PATIENT DISPOSITION:  PACU - hemodynamically stable.   Delay start of Pharmacological VTE agent (>24hrs) due to surgical blood loss or risk of bleeding:  yes

## 2015-07-27 NOTE — Anesthesia Preprocedure Evaluation (Addendum)
Anesthesia Evaluation  Patient identified by MRN, date of birth, ID band Patient awake    Reviewed: Allergy & Precautions, NPO status , Patient's Chart, lab work & pertinent test results  History of Anesthesia Complications Negative for: history of anesthetic complications  Airway Mallampati: II  TM Distance: >3 FB Neck ROM: Full    Dental  (+) Teeth Intact, Dental Advisory Given, Lower Dentures   Pulmonary sleep apnea and Continuous Positive Airway Pressure Ventilation , former smoker,    Pulmonary exam normal breath sounds clear to auscultation       Cardiovascular hypertension, Pt. on medications (-) angina+ Peripheral Vascular Disease, +CHF and + DVT  (-) CAD Normal cardiovascular exam Rhythm:Regular Rate:Normal  09/11/12 Echo: Study Conclusions  - Left ventricle: Technically limited study. The cavity size  was normal. Wall thickness was increased in a pattern      ofmild LVH. The estimated ejection fraction was 60%. Regional wall motion abnormalities cannot be excluded. - Left atrium: The atrium was mildly dilated. - Right ventricle: The cavity size was normal. Systolic function was low normal.   Neuro/Psych BUE Neuropathy  negative psych ROS   GI/Hepatic negative GI ROS, Neg liver ROS,   Endo/Other  diabetes, Type 2, Insulin DependentMorbid obesity  Renal/GU negative Renal ROS     Musculoskeletal negative musculoskeletal ROS (+)   Abdominal   Peds  Hematology  (+) Blood dyscrasia, anemia ,   Anesthesia Other Findings Day of surgery medications reviewed with the patient.  Reproductive/Obstetrics                           Anesthesia Physical Anesthesia Plan  ASA: III  Anesthesia Plan: General   Post-op Pain Management:    Induction: Intravenous  Airway Management Planned: Oral ETT  Additional Equipment: Arterial line  Intra-op Plan:   Post-operative Plan:  Extubation in OR  Informed Consent: I have reviewed the patients History and Physical, chart, labs and discussed the procedure including the risks, benefits and alternatives for the proposed anesthesia with the patient or authorized representative who has indicated his/her understanding and acceptance.   Dental advisory given  Plan Discussed with: CRNA  Anesthesia Plan Comments: (Risks/benefits of general anesthesia discussed with patient including risk of damage to teeth, lips, gum, and tongue, nausea/vomiting, allergic reactions to medications, and the possibility of heart attack, stroke and death.  All patient questions answered.  Patient wishes to proceed.)        Anesthesia Quick Evaluation

## 2015-07-28 ENCOUNTER — Encounter (HOSPITAL_COMMUNITY): Payer: Self-pay | Admitting: Neurological Surgery

## 2015-07-28 ENCOUNTER — Encounter (INDEPENDENT_AMBULATORY_CARE_PROVIDER_SITE_OTHER): Payer: Medicare Other | Admitting: Ophthalmology

## 2015-07-28 LAB — CBC
HCT: 31.6 % — ABNORMAL LOW (ref 39.0–52.0)
Hemoglobin: 10.4 g/dL — ABNORMAL LOW (ref 13.0–17.0)
MCH: 31 pg (ref 26.0–34.0)
MCHC: 32.9 g/dL (ref 30.0–36.0)
MCV: 94.3 fL (ref 78.0–100.0)
Platelets: 233 10*3/uL (ref 150–400)
RBC: 3.35 MIL/uL — ABNORMAL LOW (ref 4.22–5.81)
RDW: 13.7 % (ref 11.5–15.5)
WBC: 9.9 10*3/uL (ref 4.0–10.5)

## 2015-07-28 LAB — BASIC METABOLIC PANEL
Anion gap: 5 (ref 5–15)
BUN: 22 mg/dL — ABNORMAL HIGH (ref 6–20)
CO2: 24 mmol/L (ref 22–32)
Calcium: 8.2 mg/dL — ABNORMAL LOW (ref 8.9–10.3)
Chloride: 105 mmol/L (ref 101–111)
Creatinine, Ser: 1.26 mg/dL — ABNORMAL HIGH (ref 0.61–1.24)
GFR calc Af Amer: >60 mL/min (ref >60)
GFR calc non Af Amer: 55 mL/min — ABNORMAL LOW (ref >60)
Glucose, Bld: 166 mg/dL — ABNORMAL HIGH (ref 65–99)
Potassium: 5.2 mmol/L — ABNORMAL HIGH (ref 3.5–5.1)
Sodium: 134 mmol/L — ABNORMAL LOW (ref 135–145)

## 2015-07-28 LAB — GLUCOSE, CAPILLARY
Glucose-Capillary: 158 mg/dL — ABNORMAL HIGH (ref 65–99)
Glucose-Capillary: 145 mg/dL — ABNORMAL HIGH (ref 65–99)
Glucose-Capillary: 92 mg/dL (ref 65–99)
Glucose-Capillary: 103 mg/dL — ABNORMAL HIGH (ref 65–99)

## 2015-07-28 LAB — HEMOGLOBIN A1C
Hgb A1c MFr Bld: 7.8 % — ABNORMAL HIGH (ref 4.8–5.6)
Mean Plasma Glucose: 177 mg/dL

## 2015-07-28 MED ORDER — ALTEPLASE 2 MG IJ SOLR
2.0000 mg | Freq: Once | INTRAMUSCULAR | Status: AC
  Administered 2015-07-28: 2 mg
  Filled 2015-07-28: qty 2

## 2015-07-28 MED ORDER — SODIUM CHLORIDE 0.9% FLUSH: 10.0000 mL | INTRAVENOUS | Status: DC | PRN

## 2015-07-28 MED ORDER — ONDANSETRON HCL 4 MG PO TABS
4.0000 mg | ORAL_TABLET | ORAL | Status: DC | PRN
  Administered 2015-07-28: 4 mg via ORAL
  Filled 2015-07-28: qty 1

## 2015-07-28 MED ORDER — CEFAZOLIN IN D5W 1 GM/50ML IV SOLN
1.0000 g | Freq: Three times a day (TID) | INTRAVENOUS | Status: AC
  Administered 2015-07-28 – 2015-07-29 (×2): 1 g via INTRAVENOUS
  Filled 2015-07-28 (×2): qty 50

## 2015-07-28 NOTE — Progress Notes (Signed)
Placed CPAP on patient for the night without complications. Patient is tolerating it well, RT will monitor as needed.

## 2015-07-28 NOTE — Progress Notes (Signed)
No acute overnight events AVSS Grip and lower extremity strength increasing Incision looks good Keep drain PT/OT Dispo planning

## 2015-07-28 NOTE — Care Management Important Message (Signed)
Important Message  Patient Details  Name: Gary Nelson MRN: [id number] Date of Birth: [birth year]   Medicare Important Message Given:  Yes    Sitlali Koerner, Leroy Sea 07/28/2015, 1:50 PM

## 2015-07-28 NOTE — Care Management Note (Signed)
Case Management Note  Patient Details  Name: Gary Nelson MRN: [id number] Date of Birth: 02/23/1940  Subjective/Objective:   75 y.o. M admitted 07/27/2015 for  C4-5 laminetomy for decompression of spinal cord and C4-T1 fixation and fusion. PT evaluation recommending SNF at discharge as pt has been getting progressively weaker over the past 5 weeks at home with his wife and will require ST rehab in the post acute hospital period.                    Action/Plan: Deferred discharge disposition to CSW at present but will be available should additional CM needs arise   Expected Discharge Date:                  Expected Discharge Plan:  Skilled Nursing Facility  In-House Referral:  Clinical Social Work  Discharge planning Services  CM Consult  Post Acute Care Choice:    Choice offered to:     DME Arranged:    DME Agency:     HH Arranged:    [place] Agency:     Status of Service:  In process, will continue to follow  If discussed at Long Length of Stay Meetings, dates discussed:    Additional Comments:  Delrae Sawyers, RN 07/28/2015, 10:18 AM

## 2015-07-28 NOTE — Evaluation (Signed)
Physical Therapy Evaluation Patient Details Name: Gary Nelson MRN: [id number] DOB: 12-10-1940 Today's Date: 07/28/2015   History of Present Illness  75 year old male with cervical spondylosis and myelopathy and prior anterior fusion C3-5. He has developed progressive weakness and has been unable to ambulate for the last 5 weeks. 6/20 pt underwent Cr-5 laminetomy for decompression of spinal cord and C4-T1 fixation and fusion.  Clinical Impression  Patient is s/p above surgery resulting in the deficits listed below (see PT Problem List). Pt with LE weakness R LE worse than L greatly limiting OOB mobility and ambulation at this time.Pt to benefit from SNF upon d/c to allow for increased time to achieve maximal functional recovery. Patient will benefit from skilled PT to increase their independence and safety with mobility (while adhering to their precautions) to allow discharge to the venue listed below.     Follow Up Recommendations SNF;Supervision/Assistance - 24 hour    Equipment Recommendations  None recommended by PT    Recommendations for Other Services       Precautions / Restrictions Precautions Precautions: Cervical;Fall Precaution Comments: handout provide, pt with verbal understanding Restrictions Weight Bearing Restrictions: No      Mobility  Bed Mobility Overal bed mobility: Needs Assistance Bed Mobility: Rolling;Sidelying to Sit Rolling: Min assist Sidelying to sit: Mod assist;+2 for physical assistance       General bed mobility comments: max directional v/c's, use of bed rail, assist for trunk elevation and LE mangemen off bed  Transfers Overall transfer level: Needs assistance Equipment used: Rolling walker (2 wheeled) Transfers: Sit to/from [facility] Sit to Stand: Max assist;+2 physical assistance Stand pivot transfers: Max assist;+2 physical assistance       General transfer comment: pt with good UE strength but  poor LE strength,  requires use of bed pad to achieve hip extension, unable to achieve R terminal knee extension, pt very unsteady and wobbly. pt with bilat knee buckling requiring blocking of knees, assist to advance R LE. used gait belt with bed pad to assist with maintaining hip extension/upright position/minimizing trunk flexion  Ambulation/Gait             General Gait Details: limit to stand pvt to chair  Stairs            Wheelchair Mobility    Modified Rankin (Stroke Patients Only)       Balance Overall balance assessment: Needs assistance Sitting-balance support: Feet supported;No upper extremity supported Sitting balance-Leahy Scale: Fair     Standing balance support: Bilateral upper extremity supported Standing balance-Leahy Scale: Zero Standing balance comment: requires physical assist and RW for safe standing                             Pertinent Vitals/Pain Pain Assessment: 0-10 Pain Score: 5  Pain Location: neck, was 1/10 in supine, 5/10 once in sitting Pain Descriptors / Indicators: Constant Pain Intervention(s): Monitored during session    Home Living Family/patient expects to be discharged to:: Skilled nursing facility                 Additional Comments: pt lives alone in 1 story home with ramp. has been using RW and w/c but has had freq falls due to weakness. nephew and brother have been providing 24/7 assist as able    Prior Function Level of Independence: Needs assistance   Gait / Transfers Assistance Needed: has only been able to  stand pvt with RW to w/c or BSC due to LE weakness  ADL's / Homemaking Assistance Needed: assist for all adls and feeding due to minimal grip and strength        Hand Dominance   Dominant Hand: Right    Extremity/Trunk Assessment   Upper Extremity Assessment: Generalized weakness (however pt reports improved since surgery)           Lower Extremity Assessment: RLE deficits/detail;LLE  deficits/detail RLE Deficits / Details: grossly 2/5, 80% sensation deficit LLE Deficits / Details: grossly 3-/5, 50% numbness  Cervical / Trunk Assessment: Other exceptions  Communication   Communication: No difficulties  Cognition Arousal/Alertness: Awake/alert Behavior During Therapy: WFL for tasks assessed/performed Overall Cognitive Status: Within Functional Limits for tasks assessed                      General Comments General comments (skin integrity, edema, etc.): pt with multiple skin tears and bruising t/o LEs and UEs    Exercises        Assessment/Plan    PT Assessment Patient needs continued PT services  PT Diagnosis Difficulty walking;Generalized weakness   PT Problem List Decreased strength;Decreased range of motion;Decreased activity tolerance;Decreased balance;Decreased mobility;Decreased coordination  PT Treatment Interventions     PT Goals (Current goals can be found in the Care Plan section) Acute Rehab PT Goals Patient Stated Goal: to get better and walk PT Goal Formulation: With patient Time For Goal Achievement: 08/04/15 Potential to Achieve Goals: Good    Frequency Min 5X/week   Barriers to discharge Decreased caregiver support lives alone    Co-evaluation               End of Session Equipment Utilized During Treatment: Gait belt Activity Tolerance: Patient tolerated treatment well Patient left: in chair;with call bell/phone within reach Nurse Communication: Mobility status;Need for lift equipment         Time: 0805-0830 PT Time Calculation (min) (ACUTE ONLY): 25 min   Charges:   PT Evaluation $PT Eval Moderate Complexity: 1 Procedure PT Treatments $Therapeutic Activity: 8-22 mins   PT G Codes:        Kingsley Callander 07/28/2015, 9:03 AM   Kittie Plater, PT, DPT Pager #: [phone] Office #: [phone]

## 2015-07-28 NOTE — Evaluation (Signed)
Occupational Therapy Evaluation Patient Details Name: Gary Nelson MRN: [id number] DOB: [birth year] Today's Date: 07/28/2015    History of Present Illness 75 year old male with cervical spondylosis and myelopathy and prior anterior fusion C3-5. He has developed progressive weakness and has been unable to ambulate for the last 5 weeks. 6/20 pt underwent Cr-5 laminetomy for decompression of spinal cord and C4-T1 fixation and fusion.   Clinical Impression   Pt reports he required assist with all ADLs PTA. Currently pt requires max assist +2 for basic transfers and LB ADLs, min assist for UB ADLs. Pt required verbal cues throughout functional activities to maintain cervical precautions. Pt presenting with bil UE weakness and decreased fine motor coordination impacting his independence and safety with ADLs and functional mobility. Pt very motivated to return to functional independence and willing to participate with therapy. Recommending SNF for follow up in order to maximize independence and safety with ADLs and functional mobility prior to return home. Pt would benefit from continued skilled OT to address established goals.    Follow Up Recommendations  SNF;Supervision/Assistance - 24 hour    Equipment Recommendations  Other (comment) (TBD at next venue)    Recommendations for Other Services       Precautions / Restrictions Precautions Precautions: Cervical;Fall Precaution Comments: reviewed cervial precations with pt; requires verbal cues to maintain during functional activities. Restrictions Weight Bearing Restrictions: No      Mobility Bed Mobility Overal bed mobility: Needs Assistance Bed Mobility: Rolling;Sit to Sidelying Rolling: Min assist       Sit to sidelying: Mod assist;+2 for physical assistance General bed mobility comments: Pt with difficulty perfoming sit to sidelying despite max verbal cues. Pt attempting to reach overhead to pull up in bed; cues for to  pulling.  Transfers Overall transfer level: Needs assistance Equipment used: 2 person hand held assist;Rolling walker (2 wheeled) Transfers: Sit to/from [facility] Sit to Stand: Max assist;+2 physical assistance (with RW) Stand pivot transfers: Max assist;+2 physical assistance (with 2 person hand held assist)       General transfer comment: Use of bed pad to transfer from chair to EOB. R knee buckles during transfers.     Balance Overall balance assessment: Needs assistance Sitting-balance support: Feet supported;No upper extremity supported Sitting balance-Leahy Scale: Fair     Standing balance support: Bilateral upper extremity supported Standing balance-Leahy Scale: Poor                              ADL Overall ADL's : Needs assistance/impaired Eating/Feeding: Set up;Sitting   Grooming: Set up;Sitting;Oral care Grooming Details (indicate cue type and reason): Increased time required secondary to coordination and strength deficits. Upper Body Bathing: Minimal assitance;Sitting   Lower Body Bathing: Maximal assistance;+2 for physical assistance;Sit to/from stand   Upper Body Dressing : Minimal assistance;Sitting   Lower Body Dressing: Maximal assistance;+2 for physical assistance;Sit to/from stand   Toilet Transfer: Maximal assistance;+2 for physical assistance;Stand-pivot;BSC Toilet Transfer Details (indicate cue type and reason): Simulated by stand pivot from chair to bed. Toileting- Clothing Manipulation and Hygiene: Maximal assistance;+2 for physical assistance;Sit to/from stand       Functional mobility during ADLs: Maximal assistance;+2 for physical assistance General ADL Comments: Pt reports UE strength and sensation improved since prior to sx. Pt able to perform brushing teeth with set up and reports he is able to self feed.     Vision Vision Assessment?: No apparent visual  deficits   Perception     Praxis      Pertinent  Vitals/Pain Pain Assessment: Faces Faces Pain Scale: Hurts little more Pain Location: neck Pain Descriptors / Indicators: Sore Pain Intervention(s): Monitored during session;Repositioned     Hand Dominance Right   Extremity/Trunk Assessment Upper Extremity Assessment Upper Extremity Assessment: Generalized weakness   Lower Extremity Assessment Lower Extremity Assessment: Defer to PT evaluation   Cervical / Trunk Assessment Cervical / Trunk Assessment: Other exceptions Cervical / Trunk Exceptions: s/p cervical sx   Communication Communication Communication: No difficulties   Cognition Arousal/Alertness: Awake/alert Behavior During Therapy: WFL for tasks assessed/performed Overall Cognitive Status: Within Functional Limits for tasks assessed       Memory: Decreased recall of precautions             General Comments       Exercises       Shoulder Instructions      Home Living Family/patient expects to be discharged to:: Skilled nursing facility                                 Additional Comments: pt lives alone in 1 story home with ramp. has been using RW and w/c but has had freq falls due to weakness. nephew and brother have been providing 24/7 assist as able      Prior Functioning/Environment Level of Independence: Needs assistance  Gait / Transfers Assistance Needed: has only been able to stand pvt with RW to w/c or BSC due to LE weakness ADL's / Homemaking Assistance Needed: assist for all adls and feeding due to minimal grip and strength        OT Diagnosis: Generalized weakness;Acute pain   OT Problem List: Decreased strength;Impaired balance (sitting and/or standing);Decreased coordination;Decreased knowledge of use of DME or AE;Decreased knowledge of precautions;Decreased safety awareness;Impaired sensation;Obesity;Impaired UE functional use;Pain;Increased edema   OT Treatment/Interventions: Self-care/ADL training;Therapeutic  exercise;Energy conservation;DME and/or AE instruction;Therapeutic activities;Patient/family education;Balance training    OT Goals(Current goals can be found in the care plan section) Acute Rehab OT Goals Patient Stated Goal: get better OT Goal Formulation: With patient Time For Goal Achievement: 08/11/15 Potential to Achieve Goals: Good ADL Goals Pt Will Perform Upper Body Bathing: with set-up;sitting Pt Will Perform Lower Body Bathing: with min assist;sit to/from stand Pt Will Transfer to Toilet: with min assist;ambulating;bedside commode Pt Will Perform Toileting - Clothing Manipulation and hygiene: with min assist;sit to/from stand Pt/caregiver will Perform Home Exercise Program: Increased strength;Both right and left upper extremity;With theraband;With theraputty;Independently;With written HEP provided (increase fine motor coordination)  OT Frequency: Min 2X/week   Barriers to D/C:            Co-evaluation              End of Session Equipment Utilized During Treatment: Gait belt;Rolling walker  Activity Tolerance: Patient tolerated treatment well Patient left: in bed;with call bell/phone within reach   Time: 1211-1245 OT Time Calculation (min): 34 min Charges:  OT General Charges $OT Visit: 1 Procedure OT Evaluation $OT Eval Moderate Complexity: 1 Procedure OT Treatments $Self Care/Home Management : 8-22 mins G-Codes:     Binnie Kand M.S., OTR/L Pager: [phone]  07/28/2015, 2:04 PM

## 2015-07-29 LAB — GLUCOSE, CAPILLARY
Glucose-Capillary: 89 mg/dL (ref 65–99)
Glucose-Capillary: 136 mg/dL — ABNORMAL HIGH (ref 65–99)
Glucose-Capillary: 139 mg/dL — ABNORMAL HIGH (ref 65–99)
Glucose-Capillary: 201 mg/dL — ABNORMAL HIGH (ref 65–99)

## 2015-07-29 LAB — BASIC METABOLIC PANEL
Anion gap: 6 (ref 5–15)
BUN: 19 mg/dL (ref 6–20)
CO2: 26 mmol/L (ref 22–32)
Calcium: 8.5 mg/dL — ABNORMAL LOW (ref 8.9–10.3)
Chloride: 105 mmol/L (ref 101–111)
Creatinine, Ser: 1.33 mg/dL — ABNORMAL HIGH (ref 0.61–1.24)
GFR calc Af Amer: 60 mL/min — ABNORMAL LOW (ref >60)
GFR calc non Af Amer: 51 mL/min — ABNORMAL LOW (ref >60)
Glucose, Bld: 93 mg/dL (ref 65–99)
Potassium: 4.5 mmol/L (ref 3.5–5.1)
Sodium: 137 mmol/L (ref 135–145)

## 2015-07-29 NOTE — NC FL2 (Signed)
[place] LEVEL OF CARE SCREENING TOOL     IDENTIFICATION  Patient Name: Gary Nelson Birthdate: 1940/04/11 Sex: male Admission Date (Current Location): 07/27/2015  [facility] and [place] Number:  [employer] and Address:  [facility] [place] [place], [place] [address] [place], [place] [address]      Provider Number: [id number]  Attending Physician Name and Address:  Kevan Ny Ditty, MD  Relative Name and Phone Number:  Kyrel Leighton - brother.  [phone]    Current Level of Care: Hospital Recommended Level of Care: [place] Prior Approval Number:    Date Approved/Denied:   PASRR Number: [id number] Irene Shipper. 03/22/09)  Discharge Plan: SNF    Current Diagnoses: Patient Active Problem List   Diagnosis Date Noted  . 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 Adenocarcinoma of colon ([place]) 07/15/2015  . Pulmonary nodule 07/15/2015  . Sepsis ([place]) 03/19/2013  . CHF, chronic ([place]) 03/19/2013  . Diarrhea 02/19/2013  . Dvt femoral (deep venous thrombosis) ([place]) 02/19/2013  . HTN (hypertension) 09/09/2012  . Diabetes mellitus ([place]) 09/09/2012  . Morbid obesity ([place]) 09/09/2012  . Sleep apnea 09/09/2012  . CKD (chronic kidney disease) 09/09/2012    Orientation RESPIRATION BLADDER Height & Weight     Self, Time, Situation, Place  Other (Comment) (Patient wears CPAP at night) Continent Weight: (!) 300 lb 7.8 oz (136.3 kg) Height:  '5\' 5"'$  (165.1 cm)  BEHAVIORAL SYMPTOMS/MOOD NEUROLOGICAL BOWEL NUTRITION STATUS      Continent Diet (Heart healthy-Carb modified)  AMBULATORY STATUS COMMUNICATION OF NEEDS Skin   Extensive Assist (During PT eval on 6/21 patient limited to stand/pivot to chair) Verbally Skin abrasions (Abrasion-right anterior leg and skin tear-left mid arm)                       Personal Care Assistance Level of Assistance  Bathing, Feeding, Dressing Bathing Assistance: Maximum assistance Feeding  assistance: Limited assistance (Patient needs assistance with set-up) Dressing Assistance: Maximum assistance     Functional Limitations Info  Sight, Hearing, Speech Sight Info: Adequate Hearing Info: Adequate Speech Info: Adequate    SPECIAL CARE FACTORS FREQUENCY  PT (By licensed PT), OT (By licensed OT)     PT Frequency: Evaluated 6/21 and a minimum of 5X per week therapy recommended OT Frequency: Evaluated 6/21 and a minimum of 2X per week therapy recommended            Contractures Contractures Info: Not present    Additional Factors Info  Code Status, Allergies, Insulin Sliding Scale Code Status Info: Full Allergies Info: No known allergies   Insulin Sliding Scale Info: 0-20 Units 3X daily before meals and at bedtime       Current Medications (07/29/2015):  This is the current hospital active medication list Current Facility-Administered Medications  Medication Dose Route Frequency Provider Last Rate Last Dose  . 0.9 %  sodium chloride infusion   Intravenous Continuous Kevan Ny Ditty, MD 100 mL/hr at 07/28/15 1420    . 0.9 %  sodium chloride infusion  250 mL Intravenous Continuous Kevan Ny Ditty, MD   0 mL at 07/27/15 2028  . acetaminophen (TYLENOL) tablet 1,000 mg  1,000 mg Oral Q6H Kevan Ny Ditty, MD   1,000 mg at 07/29/15 0859  . aspirin EC tablet 81 mg  81 mg Oral Daily Kevan Ny Ditty, MD   81 mg at 07/29/15 1046  . bisacodyl (DULCOLAX) EC tablet 5 mg  5  mg Oral Daily PRN Kevan Ny Ditty, MD      . celecoxib (CELEBREX) capsule 200 mg  200 mg Oral BID Kevan Ny Ditty, MD   200 mg at 07/29/15 1046  . docusate sodium (COLACE) capsule 100 mg  100 mg Oral BID Kevan Ny Ditty, MD   100 mg at 07/29/15 1046  . furosemide (LASIX) tablet 20 mg  20 mg Oral Daily Kevan Ny Ditty, MD   20 mg at 07/29/15 1046  . gabapentin (NEURONTIN) tablet 600 mg  600 mg Oral TID Kevan Ny Ditty, MD   600 mg at 07/29/15 1045  . insulin aspart  (novoLOG) injection 0-20 Units  0-20 Units Subcutaneous TID AC & HS Tamala Fothergill, MD   3 Units at 07/29/15 1226  . insulin NPH Human (HUMULIN N,NOVOLIN N) injection 30 Units  30 Units Subcutaneous QAC breakfast Theone Murdoch Hammons, RPH   30 Units at 07/29/15 0901   And  . insulin NPH Human (HUMULIN N,NOVOLIN N) injection 15 Units  15 Units Subcutaneous QHS Theone Murdoch Hammons, RPH   15 Units at 07/29/15 0027  . lactated ringers infusion   Intravenous Continuous Catalina Gravel, MD 50 mL/hr at 07/27/15 1054    . lisinopril (PRINIVIL,ZESTRIL) tablet 40 mg  40 mg Oral Daily Tamala Fothergill, MD   Stopped at 07/29/15 1046  . menthol-cetylpyridinium (CEPACOL) lozenge 3 mg  1 lozenge Oral PRN Kevan Ny Ditty, MD       Or  . phenol (CHLORASEPTIC) mouth spray 1 spray  1 spray Mouth/Throat PRN Kevan Ny Ditty, MD      . methocarbamol (ROBAXIN) tablet 750 mg  750 mg Oral QID Kevan Ny Ditty, MD   750 mg at 07/29/15 1046  . nystatin cream (MYCOSTATIN) 1 application  1 application Topical BID Tamala Fothergill, MD   1 application at 52/84/13 1053  . ondansetron (ZOFRAN) tablet 4 mg  4 mg Oral Q4H PRN Kevan Ny Ditty, MD   4 mg at 07/28/15 1021  . oxyCODONE (Oxy IR/ROXICODONE) immediate release tablet 5 mg  5 mg Oral Q3H PRN Kevan Ny Ditty, MD   5 mg at 07/28/15 0840  . oxyCODONE (OXYCONTIN) 12 hr tablet 20 mg  20 mg Oral Q12H Tamala Fothergill, MD   Stopped at 07/29/15 1046  . pantoprazole (PROTONIX) EC tablet 20 mg  20 mg Oral Daily Kevan Ny Ditty, MD   20 mg at 07/28/15 1025  . potassium chloride (K-DUR,KLOR-CON) CR tablet 10 mEq  10 mEq Oral Daily Kevan Ny Ditty, MD   10 mEq at 07/29/15 1046  . senna (SENOKOT) tablet 8.6 mg  1 tablet Oral BID Kevan Ny Ditty, MD   8.6 mg at 07/29/15 1046  . sodium chloride flush (NS) 0.9 % injection 10-40 mL  10-40 mL Intracatheter PRN Kevan Ny Ditty, MD      . sodium chloride flush (NS) 0.9 % injection 3  mL  3 mL Intravenous Q12H Kevan Ny Ditty, MD   3 mL at 07/29/15 1055  . sodium chloride flush (NS) 0.9 % injection 3 mL  3 mL Intravenous PRN Kevan Ny Ditty, MD      . sodium phosphate (FLEET) 7-19 GM/118ML enema 1 enema  1 enema Rectal Once PRN Tamala Fothergill, MD         Discharge Medications: Please see discharge summary for a list of discharge medications.  Relevant Imaging Results:  Relevant Lab Results:   Additional Information ss# [id number]  Sable Feil, LCSW

## 2015-07-29 NOTE — Clinical Social Work Note (Signed)
Clinical Social Work Assessment  Patient Details  Name: Gary Nelson MRN: [id number] Date of Birth: 12-08-1940  Date of referral:  07/29/15               Reason for consult:  Facility Placement, Discharge Planning                Permission sought to share information with:  Family Supports, Customer service manager, Case Optician, dispensing granted to share information::  Yes, Verbal Permission Granted  Name::      Leverne Humbles )  Agency::   (SNF's )  Relationship::   (Brother )  Contact Information:   [phone])  Housing/Transportation Living arrangements for the past 2 months:  Single Family Home Source of Information:  Patient Patient Interpreter Needed:  None Criminal Activity/Legal Involvement Pertinent to Current Situation/Hospitalization:  No - Comment as needed Significant Relationships:  Siblings Lives with:  Self Do you feel safe going back to the place where you live?  No Need for family participation in patient care:  Yes (Comment)  Care giving concerns:  Patient requiring short-term rehab placement at skilled level. Potential barriers: will likely need facility that can make accomdations for bariatric bed.   Social Worker assessment / plan: Holiday representative met with patient with Theme park manager at bedside. Patient gave CSW to discuss discharge planning while Doristine Bosworth present. CSW introduced CSW role and SNF process. CSW also reviewed and provided SNF list. Patient stated he is familiar with SNF process and stated that he spent about 10 weeks at [facility] in 2013 and was there once before in 2011. Patient expressed that he does not want to go back to [facility]. Doristine Bosworth asked if patient could go to an acute rehab. CSW explained that currently patient is not a candidate for IP REHAB. Patient also agreeable to be faxed out in [facility] No further concerns reported at this time. CSW will continue to follow pt and pt's family for continued support and to  facilitate pt's d/c needs once stable.   Employment status:  Retired Forensic scientist:  Medicare PT Recommendations:  [place] / Referral to community resources:  [place]  Patient/Family's Response to care:  Pt alert and oriented x4. Pt agreeable to SNF however does not wish for info to be sent to [facility]. Pt pleasant and very appreciative of social work intervention.   Patient/Family's Understanding of and Emotional Response to Diagnosis, Current Treatment, and Prognosis:  Pt knowledgeable of surgical interventions.   Emotional Assessment Appearance:  Appears stated age Attitude/Demeanor/Rapport:   (Pleasant ) Affect (typically observed):  Accepting, Appropriate, Pleasant Orientation:  Oriented to Situation, Oriented to  Time, Oriented to Place, Oriented to Self Alcohol / Substance use:  Not Applicable Psych involvement (Current and /or in the community):  No (Comment)  Discharge Needs  Concerns to be addressed:  Care Coordination, Basic Needs Readmission within the last 30 days:  No Current discharge risk:  Dependent with Mobility Barriers to Discharge:  Continued Medical Work up   [employer], MSW, LCSWA [phone] 07/29/2015 11:16 AM

## 2015-07-29 NOTE — Progress Notes (Signed)
Physical Therapy Treatment Patient Details Name: Gary Nelson MRN: [id number] DOB: Oct 26, 1940 Today's Date: 07/29/2015    History of Present Illness 75 year old male with cervical spondylosis and myelopathy and prior anterior fusion C3-5. He has developed progressive weakness and has been unable to ambulate for the last 5 weeks. 6/20 pt underwent Cr-5 laminetomy for decompression of spinal cord and C4-T1 fixation and fusion.    PT Comments    Patient progressing slowly towards PT goals. This session focused on standing tolerance and weight bearing through BLEs. Continues to have BLE weakness with bil knee buckling in standing. Heavy Max A of 2 transfer to chair but fatigues quickly. Instructed pt in exercises to perform during the day. Will follow.   Follow Up Recommendations  SNF;Supervision/Assistance - 24 hour     Equipment Recommendations  None recommended by PT    Recommendations for Other Services       Precautions / Restrictions Precautions Precautions: Cervical;Fall Precaution Comments: reviewed cervial precations with pt; requires verbal cues to maintain during functional activities. Restrictions Weight Bearing Restrictions: No    Mobility  Bed Mobility Overal bed mobility: Needs Assistance Bed Mobility: Rolling;Sidelying to Sit Rolling: Min assist Sidelying to sit: Mod assist;+2 for physical assistance;HOB elevated       General bed mobility comments: Cues for log roll. Able to get up on elbow but needs assist to come to full upright.   Transfers Overall transfer level: Needs assistance Equipment used: 2 person hand held assist;Rolling walker (2 wheeled) Transfers: Sit to/from [facility] Sit to Stand: Max assist;+2 physical assistance Stand pivot transfers: Max assist;+2 physical assistance       General transfer comment: Use of bed pad to transfer from chair to EOB. Bil knees buckle during transfer. Stood from EOB x5 using pad to help  with hip extension/upright and blocking bil knees. Knee buckling during standing, able to use UEs for upright.  Ambulation/Gait             General Gait Details: limit to stand pvt to chair   Stairs            Wheelchair Mobility    Modified Rankin (Stroke Patients Only)       Balance Overall balance assessment: Needs assistance Sitting-balance support: Feet supported;No upper extremity supported Sitting balance-Leahy Scale: Fair     Standing balance support: During functional activity Standing balance-Leahy Scale: Poor Standing balance comment: Able to stand for short periods- a few seconds at a time with bil knees buckling; Manual cues for upright posture and stabilizing bil knees.                    Cognition Arousal/Alertness: Awake/alert Behavior During Therapy: WFL for tasks assessed/performed Overall Cognitive Status: Within Functional Limits for tasks assessed       Memory: Decreased recall of precautions ("no one told me anything about that.")              Exercises General Exercises - Lower Extremity Ankle Circles/Pumps: Both;20 reps;Supine Quad Sets: Both;10 reps;Seated Gluteal Sets: Both;10 reps;Seated Hip ABduction/ADduction: Both;10 reps;Seated    General Comments General comments (skin integrity, edema, etc.): Brother present during session.      Pertinent Vitals/Pain Pain Assessment: Faces Faces Pain Scale: Hurts a little bit Pain Location: neck Pain Descriptors / Indicators: Sore Pain Intervention(s): Monitored during session;Repositioned    Home Living  Prior Function            PT Goals (current goals can now be found in the care plan section) Progress towards PT goals: Progressing toward goals    Frequency  Min 5X/week    PT Plan Current plan remains appropriate    Co-evaluation             End of Session Equipment Utilized During Treatment: Gait belt Activity  Tolerance: Patient tolerated treatment well Patient left: in chair;with family/visitor present;with call bell/phone within reach     Time: 1401-1432 PT Time Calculation (min) (ACUTE ONLY): 31 min  Charges:  $Therapeutic Exercise: 8-22 mins $Therapeutic Activity: 8-22 mins                    G Codes:      Sie Formisano A Oanh Devivo 07/29/2015, 2:49 PM Wray Kearns, [place], DPT [phone]

## 2015-07-29 NOTE — Clinical Social Work Placement (Signed)
   CLINICAL SOCIAL WORK PLACEMENT  NOTE  Date:  07/29/2015  Patient Details  Name: Gary Nelson MRN: [id number] Date of Birth: 1941/02/05  Clinical Social Work is seeking post-discharge placement for this patient at the [place] level of care (*CSW will initial, date and re-position this form in  chart as items are completed):  Yes   Patient/family provided with [place] Work Department's list of facilities offering this level of care within the geographic area requested by the patient (or if unable, by the patient's family).  Yes   Patient/family informed of their freedom to choose among providers that offer the needed level of care, that participate in Medicare, Medicaid or managed care program needed by the patient, have an available bed and are willing to accept the patient.  Yes   Patient/family informed of [place]'s ownership interest in [facility] and [facility], as well as of the fact that they are under no obligation to receive care at these facilities.  PASRR submitted to EDS on       PASRR number received on       Existing PASRR number confirmed on  07/29/15     FL2 transmitted to all facilities in geographic area requested by pt/family on  07/29/15 [facility].     FL2 transmitted to all facilities within larger geographic area on  07/29/15 [facility]     Patient informed that his/her managed care company has contracts with or will negotiate with certain facilities, including the following:            Patient/family informed of bed offers received.  Patient chooses bed at       Physician recommends and patient chooses bed at      Patient to be transferred to   on  .  Patient to be transferred to facility by       Patient family notified on   of transfer.  Name of family member notified:        PHYSICIAN Please sign FL2     Additional Comment:     _______________________________________________ Sable Feil, LCSW 07/29/2015, 3:56 PM

## 2015-07-29 NOTE — Progress Notes (Signed)
No acute issues AVSS Awake and alert Strength improving Incision looks good Stable D/c drain Dispo planning

## 2015-07-29 NOTE — Clinical Social Work Placement (Signed)
   CLINICAL SOCIAL WORK PLACEMENT  NOTE  Date:  07/29/2015  Patient Details  Name: Gary Nelson MRN: [id number] Date of Birth: November 19, 1940  Clinical Social Work is seeking post-discharge placement for this patient at the [place] level of care (*CSW will initial, date and re-position this form in  chart as items are completed):  Yes   Patient/family provided with [place] Work Department's list of facilities offering this level of care within the geographic area requested by the patient (or if unable, by the patient's family).  Yes   Patient/family informed of their freedom to choose among providers that offer the needed level of care, that participate in Medicare, Medicaid or managed care program needed by the patient, have an available bed and are willing to accept the patient.  Yes   Patient/family informed of [place]'s ownership interest in [facility] and [facility], as well as of the fact that they are under no obligation to receive care at these facilities.  PASRR submitted to EDS on       PASRR number received on       Existing PASRR number confirmed on       FL2 transmitted to all facilities in geographic area requested by pt/family on       FL2 transmitted to all facilities within larger geographic area on       Patient informed that his/her managed care company has contracts with or will negotiate with certain facilities, including the following:            Patient/family informed of bed offers received.  Patient chooses bed at       Physician recommends and patient chooses bed at      Patient to be transferred to   on  .  Patient to be transferred to facility by       Patient family notified on   of transfer.  Name of family member notified:        PHYSICIAN Please sign FL2     Additional Comment:    _______________________________________________ Rozell Searing, LCSW 07/29/2015, 11:16 AM

## 2015-07-30 LAB — GLUCOSE, CAPILLARY
Glucose-Capillary: 102 mg/dL — ABNORMAL HIGH (ref 65–99)
Glucose-Capillary: 163 mg/dL — ABNORMAL HIGH (ref 65–99)
Glucose-Capillary: 99 mg/dL (ref 65–99)
Glucose-Capillary: 148 mg/dL — ABNORMAL HIGH (ref 65–99)

## 2015-07-30 NOTE — Progress Notes (Signed)
No acute events Hands feel better Possibly some urinary retention Neuro stable Wound looks good Start flomax Check post void Dispo planning

## 2015-07-30 NOTE — Care Management Important Message (Signed)
Important Message  Patient Details  Name: Gary Nelson MRN: [id number] Date of Birth: 01-22-1941   Medicare Important Message Given:  Yes    Loann Quill 07/30/2015, 9:51 AM

## 2015-07-30 NOTE — Progress Notes (Signed)
Foley inserted per MD order. Peri care provided prior to insertion. Heather S assisted. Pt denied distress, clear yellow urine returned.   Ave Filter, RN

## 2015-07-30 NOTE — Progress Notes (Signed)
Physical Therapy Treatment Patient Details Name: Gary Nelson MRN: [id number] DOB: 02/17/1940 Today's Date: 07/30/2015    History of Present Illness 75 year old male with cervical spondylosis and myelopathy and prior anterior fusion C3-5. He has developed progressive weakness and has been unable to ambulate for the last 5 weeks. 6/20 pt underwent Cr-5 laminetomy for decompression of spinal cord and C4-T1 fixation and fusion.    PT Comments    Patient progressing well towards PT goals. Tolerated standing bouts today for 1 minute and 30 seconds with marching requiring 2 person assist due to bil knee instability. Able to progress BLEs during SPT today but still need Max A of 2 due to fatigue/weakness. Reviewed exercises. Will continue to follow.   Follow Up Recommendations  SNF;Supervision/Assistance - 24 hour     Equipment Recommendations  None recommended by PT    Recommendations for Other Services       Precautions / Restrictions Precautions Precautions: Cervical;Fall Precaution Comments: Reviewed cervical precautions Restrictions Weight Bearing Restrictions: No    Mobility  Bed Mobility Overal bed mobility: Needs Assistance Bed Mobility: Rolling;Sidelying to Sit Rolling: Min assist Sidelying to sit: +2 for physical assistance;HOB elevated;Mod assist       General bed mobility comments: Cues for technique and hand placement. good demo of log roll technique. Assist to bring LES to EOB and to elevate trunk.  Transfers Overall transfer level: Needs assistance Equipment used: 2 person hand held assist Transfers: Sit to/from Stand Sit to Stand: Mod assist;+2 physical assistance Stand pivot transfers: Max assist;+2 physical assistance       General transfer comment: Pt able to scoot to EOb in preparation for transfer. Required blocking of bil knees, no noted buckling with L knee. Performed bariatric transfer technique using sheet as gait belt. Pt able to perform sit to  stand from EOB x 3, weight shift in standing.  Ambulation/Gait                 Stairs            Wheelchair Mobility    Modified Rankin (Stroke Patients Only)       Balance Overall balance assessment: Needs assistance Sitting-balance support: Feet supported;No upper extremity supported Sitting balance-Leahy Scale: Fair Sitting balance - Comments: Able to reciprocal scoot to EOB without LOB or assist.    Standing balance support: During functional activity;Bilateral upper extremity supported Standing balance-Leahy Scale: Poor Standing balance comment: Requires BUE support for standing with therapist blocking bil knees due to instability. Stood for ~1 minutes, 30 sec. marching in standing with Mod A of 2.                    Cognition Arousal/Alertness: Awake/alert Behavior During Therapy: WFL for tasks assessed/performed Overall Cognitive Status: Within Functional Limits for tasks assessed       Memory: Decreased recall of precautions              Exercises General Exercises - Lower Extremity Long Arc Quad: Both;10 reps;Seated    General Comments General comments (skin integrity, edema, etc.): Reviewed exercises.       Pertinent Vitals/Pain Pain Assessment: Faces Faces Pain Scale: Hurts little more Pain Location: neck Pain Descriptors / Indicators: Sore;Operative site guarding Pain Intervention(s): Monitored during session;Repositioned    Home Living                      Prior Function  PT Goals (current goals can now be found in the care plan section) Acute Rehab PT Goals Patient Stated Goal: get better Progress towards PT goals: Progressing toward goals    Frequency  Min 5X/week    PT Plan Current plan remains appropriate    Co-evaluation PT/OT/SLP Co-Evaluation/Treatment: Yes Reason for Co-Treatment: For patient/therapist safety PT goals addressed during session: Mobility/safety with mobility;Balance OT  goals addressed during session: ADL's and self-care;Other (comment) (functional mobility)     End of Session Equipment Utilized During Treatment: Gait belt Activity Tolerance: Patient tolerated treatment well Patient left: in chair;with call bell/phone within reach;with chair alarm set;with nursing/sitter in room     Time: [id number] PT Time Calculation (min) (ACUTE ONLY): 25 min  Charges:  $Therapeutic Activity: 8-22 mins                    G Codes:      Matther Labell A Sherman Donaldson 07/30/2015, 1:45 PM Wray Kearns, [place], DPT [phone]

## 2015-07-30 NOTE — Clinical Social Work Note (Signed)
CSW spoke with patient and presented bed offers.  Patient would like to go to [facility] in [place], [place] contacted [facility] who said they can take patient once he is medically ready for discharge and orders have been received.  If patient is ready over the weekend they are able to accept him.  CSW to continue to follow patient's progress throughout discharge planning.  Jones Broom. [place], MSW, [place] 07/30/2015 5:25 PM

## 2015-07-30 NOTE — Progress Notes (Signed)
Pt refuse NIV for the night

## 2015-07-30 NOTE — Progress Notes (Signed)
Came to assess patient and see readiness for NIV for the night. Pt stated that he wants to wear his NIV for the night. Told the patient to notify RN to call me once he is ready for bed. Pt is stable at this time no distress noted.

## 2015-07-30 NOTE — Progress Notes (Signed)
Occupational Therapy Treatment Patient Details Name: Gary Nelson MRN: [id number] DOB: [birth year] Today's Date: 07/30/2015    History of present illness 75 year old male with cervical spondylosis and myelopathy and prior anterior fusion C3-5. He has developed progressive weakness and has been unable to ambulate for the last 5 weeks. 6/20 pt underwent Cr-5 laminetomy for decompression of spinal cord and C4-T1 fixation and fusion.   OT comments  Pt making gradual progress toward OT goals this session. Continues to demonstrate bil UE weakness and decreased coordination but improved from last session. Pt able to perform oral care in sitting with set up. Pt required mod assist +2 to perform multiple sit <> stand from EOB and max assist +2 for stand pivot transfer. D/c plan remains appropriate. Will continue to follow acutely.   Follow Up Recommendations  SNF;Supervision/Assistance - 24 hour    Equipment Recommendations  Other (comment) (TBD at next venue)    Recommendations for Other Services      Precautions / Restrictions Precautions Precautions: Cervical;Fall Restrictions Weight Bearing Restrictions: No       Mobility Bed Mobility Overal bed mobility: Needs Assistance Bed Mobility: Rolling;Sidelying to Sit Rolling: Min assist Sidelying to sit: Min assist;+2 for physical assistance;HOB elevated       General bed mobility comments: Cues for technique and hand placement.  Transfers Overall transfer level: Needs assistance Equipment used: 2 person hand held assist Transfers: Sit to/from [facility] Sit to Stand: Mod assist;+2 physical assistance Stand pivot transfers: Max assist;+2 physical assistance       General transfer comment: Pt able to scoot to EOb in preparation for transfer. Required blocking of bil knees, no noted buckling with L knee. No use of bed pad for transfer this session. Pt able to perform sit to stand from EOB x 3, weight shift in  standing.    Balance Overall balance assessment: Needs assistance Sitting-balance support: Feet supported;No upper extremity supported Sitting balance-Leahy Scale: Fair     Standing balance support: Bilateral upper extremity supported Standing balance-Leahy Scale: Poor                     ADL Overall ADL's : Needs assistance/impaired     Grooming: Set up;Sitting;Oral care Grooming Details (indicate cue type and reason): Increased time required but pt able to manage all fine motor tasks.                 Toilet Transfer: Maximal assistance;+2 for physical assistance;BSC;Stand-pivot Toilet Transfer Details (indicate cue type and reason): Simulated by transfer from EOB to chair         Functional mobility during ADLs: Maximal assistance;+2 for physical assistance (for stand pivot only) General ADL Comments: Even more improvement in bil UE strength and coordination. Improvements with assist needed for sit to stand from EOB.      Vision                     Perception     Praxis      Cognition   Behavior During Therapy: [facility] for tasks assessed/performed Overall Cognitive Status: Within Functional Limits for tasks assessed       Memory: Decreased recall of precautions               Extremity/Trunk Assessment               Exercises     Shoulder Instructions       General Comments  Pertinent Vitals/ Pain       Pain Assessment: Faces Faces Pain Scale: Hurts little more Pain Location: neck Pain Descriptors / Indicators: Sore Pain Intervention(s): Monitored during session;Repositioned  Home Living                                          Prior Functioning/Environment              Frequency Min 2X/week     Progress Toward Goals  OT Goals(current goals can now be found in the care plan section)  Progress towards OT goals: Progressing toward goals  Acute Rehab OT Goals Patient Stated Goal: get  better OT Goal Formulation: With patient  Plan Discharge plan remains appropriate    Co-evaluation    PT/OT/SLP Co-Evaluation/Treatment: Yes Reason for Co-Treatment: For patient/therapist safety   OT goals addressed during session: ADL's and self-care;Other (comment) (functional mobility)      End of Session Equipment Utilized During Treatment: Gait belt   Activity Tolerance Patient tolerated treatment well   Patient Left in chair;with call bell/phone within reach;with nursing/sitter in room   Nurse Communication          Time: 6435-3912 OT Time Calculation (min): 25 min  Charges: OT General Charges $OT Visit: 1 Procedure OT Treatments $Self Care/Home Management : 8-22 mins  Binnie Kand M.S., OTR/L Pager: [phone]  07/30/2015, 10:38 AM

## 2015-07-31 DIAGNOSIS — G959 Disease of spinal cord, unspecified: Secondary | ICD-10-CM | POA: Diagnosis not present

## 2015-07-31 DIAGNOSIS — M4303 Spondylolysis, cervicothoracic region: Secondary | ICD-10-CM | POA: Diagnosis not present

## 2015-07-31 DIAGNOSIS — R938 Abnormal findings on diagnostic imaging of other specified body structures: Secondary | ICD-10-CM | POA: Diagnosis not present

## 2015-07-31 DIAGNOSIS — Z9289 Personal history of other medical treatment: Secondary | ICD-10-CM | POA: Diagnosis not present

## 2015-07-31 DIAGNOSIS — M4710 Other spondylosis with myelopathy, site unspecified: Secondary | ICD-10-CM | POA: Diagnosis not present

## 2015-07-31 DIAGNOSIS — C7901 Secondary malignant neoplasm of right kidney and renal pelvis: Secondary | ICD-10-CM | POA: Diagnosis not present

## 2015-07-31 DIAGNOSIS — Z79899 Other long term (current) drug therapy: Secondary | ICD-10-CM | POA: Diagnosis not present

## 2015-07-31 DIAGNOSIS — M1612 Unilateral primary osteoarthritis, left hip: Secondary | ICD-10-CM | POA: Diagnosis not present

## 2015-07-31 DIAGNOSIS — K5900 Constipation, unspecified: Secondary | ICD-10-CM | POA: Diagnosis not present

## 2015-07-31 DIAGNOSIS — Z9889 Other specified postprocedural states: Secondary | ICD-10-CM | POA: Diagnosis not present

## 2015-07-31 DIAGNOSIS — M4320 Fusion of spine, site unspecified: Secondary | ICD-10-CM | POA: Diagnosis not present

## 2015-07-31 DIAGNOSIS — M4712 Other spondylosis with myelopathy, cervical region: Secondary | ICD-10-CM | POA: Diagnosis not present

## 2015-07-31 DIAGNOSIS — Z794 Long term (current) use of insulin: Secondary | ICD-10-CM | POA: Diagnosis not present

## 2015-07-31 DIAGNOSIS — Z7982 Long term (current) use of aspirin: Secondary | ICD-10-CM | POA: Diagnosis not present

## 2015-07-31 DIAGNOSIS — G8250 Quadriplegia, unspecified: Secondary | ICD-10-CM | POA: Diagnosis not present

## 2015-07-31 DIAGNOSIS — M79652 Pain in left thigh: Secondary | ICD-10-CM | POA: Diagnosis not present

## 2015-07-31 DIAGNOSIS — E1140 Type 2 diabetes mellitus with diabetic neuropathy, unspecified: Secondary | ICD-10-CM | POA: Diagnosis not present

## 2015-07-31 DIAGNOSIS — N401 Enlarged prostate with lower urinary tract symptoms: Secondary | ICD-10-CM | POA: Diagnosis not present

## 2015-07-31 DIAGNOSIS — R39198 Other difficulties with micturition: Secondary | ICD-10-CM | POA: Diagnosis not present

## 2015-07-31 DIAGNOSIS — I5030 Unspecified diastolic (congestive) heart failure: Secondary | ICD-10-CM | POA: Diagnosis not present

## 2015-07-31 DIAGNOSIS — Z23 Encounter for immunization: Secondary | ICD-10-CM | POA: Diagnosis present

## 2015-07-31 DIAGNOSIS — C7800 Secondary malignant neoplasm of unspecified lung: Secondary | ICD-10-CM | POA: Diagnosis not present

## 2015-07-31 DIAGNOSIS — Z87891 Personal history of nicotine dependence: Secondary | ICD-10-CM | POA: Diagnosis not present

## 2015-07-31 DIAGNOSIS — F17210 Nicotine dependence, cigarettes, uncomplicated: Secondary | ICD-10-CM | POA: Diagnosis not present

## 2015-07-31 DIAGNOSIS — G4730 Sleep apnea, unspecified: Secondary | ICD-10-CM | POA: Diagnosis not present

## 2015-07-31 DIAGNOSIS — Z510 Encounter for antineoplastic radiation therapy: Secondary | ICD-10-CM | POA: Diagnosis not present

## 2015-07-31 DIAGNOSIS — G4734 Idiopathic sleep related nonobstructive alveolar hypoventilation: Secondary | ICD-10-CM | POA: Diagnosis not present

## 2015-07-31 DIAGNOSIS — Z86718 Personal history of other venous thrombosis and embolism: Secondary | ICD-10-CM | POA: Diagnosis not present

## 2015-07-31 DIAGNOSIS — E1121 Type 2 diabetes mellitus with diabetic nephropathy: Secondary | ICD-10-CM | POA: Diagnosis not present

## 2015-07-31 DIAGNOSIS — E1142 Type 2 diabetes mellitus with diabetic polyneuropathy: Secondary | ICD-10-CM | POA: Diagnosis not present

## 2015-07-31 DIAGNOSIS — C3431 Malignant neoplasm of lower lobe, right bronchus or lung: Secondary | ICD-10-CM | POA: Diagnosis not present

## 2015-07-31 DIAGNOSIS — R278 Other lack of coordination: Secondary | ICD-10-CM | POA: Diagnosis not present

## 2015-07-31 DIAGNOSIS — Z85038 Personal history of other malignant neoplasm of large intestine: Secondary | ICD-10-CM | POA: Diagnosis not present

## 2015-07-31 DIAGNOSIS — M6281 Muscle weakness (generalized): Secondary | ICD-10-CM | POA: Diagnosis not present

## 2015-07-31 DIAGNOSIS — M160 Bilateral primary osteoarthritis of hip: Secondary | ICD-10-CM | POA: Diagnosis not present

## 2015-07-31 DIAGNOSIS — J9811 Atelectasis: Secondary | ICD-10-CM | POA: Diagnosis not present

## 2015-07-31 DIAGNOSIS — N189 Chronic kidney disease, unspecified: Secondary | ICD-10-CM | POA: Diagnosis not present

## 2015-07-31 DIAGNOSIS — I10 Essential (primary) hypertension: Secondary | ICD-10-CM | POA: Diagnosis not present

## 2015-07-31 DIAGNOSIS — R911 Solitary pulmonary nodule: Secondary | ICD-10-CM | POA: Diagnosis not present

## 2015-07-31 DIAGNOSIS — M25552 Pain in left hip: Secondary | ICD-10-CM | POA: Diagnosis not present

## 2015-07-31 DIAGNOSIS — R918 Other nonspecific abnormal finding of lung field: Secondary | ICD-10-CM | POA: Diagnosis not present

## 2015-07-31 DIAGNOSIS — E1151 Type 2 diabetes mellitus with diabetic peripheral angiopathy without gangrene: Secondary | ICD-10-CM | POA: Diagnosis not present

## 2015-07-31 DIAGNOSIS — G620 Drug-induced polyneuropathy: Secondary | ICD-10-CM | POA: Diagnosis not present

## 2015-07-31 DIAGNOSIS — R279 Unspecified lack of coordination: Secondary | ICD-10-CM | POA: Diagnosis not present

## 2015-07-31 DIAGNOSIS — E119 Type 2 diabetes mellitus without complications: Secondary | ICD-10-CM | POA: Diagnosis not present

## 2015-07-31 DIAGNOSIS — C7801 Secondary malignant neoplasm of right lung: Secondary | ICD-10-CM | POA: Diagnosis not present

## 2015-07-31 DIAGNOSIS — D381 Neoplasm of uncertain behavior of trachea, bronchus and lung: Secondary | ICD-10-CM | POA: Diagnosis not present

## 2015-07-31 DIAGNOSIS — I872 Venous insufficiency (chronic) (peripheral): Secondary | ICD-10-CM | POA: Diagnosis not present

## 2015-07-31 DIAGNOSIS — C189 Malignant neoplasm of colon, unspecified: Secondary | ICD-10-CM | POA: Diagnosis not present

## 2015-07-31 LAB — GLUCOSE, CAPILLARY: Glucose-Capillary: 123 mg/dL — ABNORMAL HIGH (ref 65–99)

## 2015-07-31 MED ORDER — HYDROCODONE-ACETAMINOPHEN 7.5-325 MG PO TABS: 1.0000 | ORAL_TABLET | Freq: Four times a day (QID) | ORAL | Status: DC | PRN

## 2015-07-31 MED ORDER — GABAPENTIN 300 MG PO CAPS: 600.0000 mg | ORAL_CAPSULE | Freq: Three times a day (TID) | ORAL | Status: DC

## 2015-07-31 MED ORDER — TAMSULOSIN HCL 0.4 MG PO CAPS: 0.4000 mg | ORAL_CAPSULE | Freq: Every day | ORAL | Status: DC

## 2015-07-31 MED ORDER — METHOCARBAMOL 750 MG PO TABS: 750.0000 mg | ORAL_TABLET | Freq: Four times a day (QID) | ORAL | Status: DC | PRN

## 2015-07-31 MED ORDER — DOCUSATE SODIUM 100 MG PO CAPS: 100.0000 mg | ORAL_CAPSULE | Freq: Two times a day (BID) | ORAL | Status: DC

## 2015-07-31 MED ORDER — TAMSULOSIN HCL 0.4 MG PO CAPS
0.4000 mg | ORAL_CAPSULE | Freq: Every day | ORAL | Status: DC
Start: 2015-07-31 — End: 2015-07-31
  Administered 2015-07-31: 0.4 mg via ORAL
  Filled 2015-07-31: qty 1

## 2015-07-31 NOTE — Progress Notes (Signed)
No acute events Doing well Stable D/c to SNF

## 2015-07-31 NOTE — Clinical Social Work Note (Signed)
Norco script signed. EMS arranged. Dispatch reported 9 patients ahead of patient.   Delay to d/c: norco script was not signed. MD paged throughout the day.   Clinical Social Worker will sign off for now as social work intervention is no longer needed. Please consult [place] again if new need arises.  Glendon Axe, MSW, LCSWA [phone] 07/31/2015 2:43 PM

## 2015-07-31 NOTE — Discharge Summary (Signed)
Date of Admission: 07/27/2015  Date of Discharge: 07/31/2015  Admission Diagnosis: Cervical spondylosis with myelopathy  Discharge Diagnosis: Same  Procedure Performed: C4-7 decompression with C4-T1 fixation and fusion  Attending: Tamala Fothergill, MD  Hospital Course:  The patient was admitted for the above listed operation and had an uncomplicated post-operative course except for urinary retention which was likely present pre-operatively.  Urology recommended he be discharged on flomax and with a foley catheter to drainage and to follow up in one week from discharge.  He is discharged in stable condition.  Follow up: 3 weeks    Medication List    TAKE these medications        aspirin EC 81 MG tablet  Take 81 mg by mouth daily.     clotrimazole 1 % cream  Commonly known as:  LOTRIMIN  Apply 1 application topically daily as needed (rash).     docusate sodium 100 MG capsule  Commonly known as:  COLACE  Take 1 capsule (100 mg total) by mouth 2 (two) times daily.     furosemide 40 MG tablet  Commonly known as:  LASIX  Take 20 mg by mouth daily.     gabapentin 300 MG capsule  Commonly known as:  NEURONTIN  Take 2 capsules (600 mg total) by mouth 3 (three) times daily.     HYDROcodone-acetaminophen 7.5-325 MG tablet  Commonly known as:  NORCO  Take 1-2 tablets by mouth every 6 (six) hours as needed for moderate pain.     insulin NPH Human 100 UNIT/ML injection  Commonly known as:  HUMULIN N,NOVOLIN N  Inject 15-30 Units into the skin 2 (two) times daily. Take 30 units in the morning and 15 units in the evening.     lisinopril 40 MG tablet  Commonly known as:  PRINIVIL,ZESTRIL  Take 40 mg by mouth daily.     methocarbamol 750 MG tablet  Commonly known as:  ROBAXIN  Take 1 tablet (750 mg total) by mouth every 6 (six) hours as needed for muscle spasms.     potassium chloride SA 20 MEQ tablet  Commonly known as:  K-DUR,KLOR-CON  Take 10 mEq by mouth daily.     tamsulosin 0.4 MG Caps capsule  Commonly known as:  FLOMAX  Take 1 capsule (0.4 mg total) by mouth daily.

## 2015-07-31 NOTE — Clinical Social Work Note (Signed)
Clinical Social Worker facilitated patient discharge including contacting patient family and facility to confirm patient discharge plans.  Clinical information faxed to facility and family agreeable with plan.  CSW arranged ambulance transport via [facility] to [facility].  RN to call report prior to discharge [phone].  Clinical Social Worker will sign off for now as social work intervention is no longer needed. Please consult [place] again if new need arises.  Glendon Axe, MSW, LCSWA [phone] 07/31/2015 10:15 AM

## 2015-07-31 NOTE — Clinical Social Work Placement (Signed)
   CLINICAL SOCIAL WORK PLACEMENT  NOTE  Date:  07/31/2015  Patient Details  Name: Gary Nelson MRN: [id number] Date of Birth: [birth year]  Clinical Social Work is seeking post-discharge placement for this patient at the [place] level of care (*CSW will initial, date and re-position this form in  chart as items are completed):  Yes   Patient/family provided with [place] Work Department's list of facilities offering this level of care within the geographic area requested by the patient (or if unable, by the patient's family).  Yes   Patient/family informed of their freedom to choose among providers that offer the needed level of care, that participate in Medicare, Medicaid or managed care program needed by the patient, have an available bed and are willing to accept the patient.  Yes   Patient/family informed of [place]'s ownership interest in [facility] and [facility], as well as of the fact that they are under no obligation to receive care at these facilities.  PASRR submitted to EDS on       PASRR number received on       Existing PASRR number confirmed on 07/29/15     FL2 transmitted to all facilities in geographic area requested by pt/family on 07/29/15     FL2 transmitted to all facilities within larger geographic area on 07/29/15     Patient informed that his/her managed care company has contracts with or will negotiate with certain facilities, including the following:        Yes   Patient/family informed of bed offers received.  Patient chooses bed at  [facility])     Physician recommends and patient chooses bed at      Patient to be transferred to  [facility]) on 07/31/15.  Patient to be transferred to facility by  [facility] )     Patient family notified on 07/31/15 of transfer.  Name of family member notified:   (Pt's brother, Laverna Peace )     PHYSICIAN Please sign FL2, Please prepare priority discharge  summary, including medications     Additional Comment:    _______________________________________________ Rozell Searing, LCSW 07/31/2015, 10:15 AM

## 2015-07-31 NOTE — Progress Notes (Addendum)
Report called to Charlsie Merles, RN, [facility] receiving nurse @ [phone]. Transferred out via stretcher by [facility]. Ambulance staff  Unwilling to transport patient private wheelchair. Brother Laverna Peace called and made aware. Will come pick up patient W/C. Labelled with sticker.

## 2015-08-03 DIAGNOSIS — G4734 Idiopathic sleep related nonobstructive alveolar hypoventilation: Secondary | ICD-10-CM | POA: Diagnosis not present

## 2015-08-03 DIAGNOSIS — K5900 Constipation, unspecified: Secondary | ICD-10-CM | POA: Diagnosis not present

## 2015-08-03 DIAGNOSIS — D381 Neoplasm of uncertain behavior of trachea, bronchus and lung: Secondary | ICD-10-CM | POA: Diagnosis not present

## 2015-08-03 DIAGNOSIS — I872 Venous insufficiency (chronic) (peripheral): Secondary | ICD-10-CM | POA: Diagnosis not present

## 2015-08-03 DIAGNOSIS — G959 Disease of spinal cord, unspecified: Secondary | ICD-10-CM | POA: Diagnosis not present

## 2015-08-03 DIAGNOSIS — G8250 Quadriplegia, unspecified: Secondary | ICD-10-CM | POA: Diagnosis not present

## 2015-08-03 DIAGNOSIS — I10 Essential (primary) hypertension: Secondary | ICD-10-CM | POA: Diagnosis not present

## 2015-08-03 DIAGNOSIS — R39198 Other difficulties with micturition: Secondary | ICD-10-CM | POA: Diagnosis not present

## 2015-08-03 DIAGNOSIS — E119 Type 2 diabetes mellitus without complications: Secondary | ICD-10-CM | POA: Diagnosis not present

## 2015-08-03 DIAGNOSIS — Z85038 Personal history of other malignant neoplasm of large intestine: Secondary | ICD-10-CM | POA: Diagnosis not present

## 2015-08-08 DIAGNOSIS — Z9289 Personal history of other medical treatment: Secondary | ICD-10-CM | POA: Diagnosis not present

## 2015-08-08 DIAGNOSIS — I10 Essential (primary) hypertension: Secondary | ICD-10-CM | POA: Diagnosis not present

## 2015-08-08 DIAGNOSIS — G8250 Quadriplegia, unspecified: Secondary | ICD-10-CM | POA: Diagnosis not present

## 2015-08-08 DIAGNOSIS — G4734 Idiopathic sleep related nonobstructive alveolar hypoventilation: Secondary | ICD-10-CM | POA: Diagnosis not present

## 2015-08-08 DIAGNOSIS — E119 Type 2 diabetes mellitus without complications: Secondary | ICD-10-CM | POA: Diagnosis not present

## 2015-08-08 DIAGNOSIS — G959 Disease of spinal cord, unspecified: Secondary | ICD-10-CM | POA: Diagnosis not present

## 2015-08-13 ENCOUNTER — Ambulatory Visit (HOSPITAL_COMMUNITY): Payer: Medicare Other | Admitting: Hematology & Oncology

## 2015-08-24 ENCOUNTER — Encounter (HOSPITAL_COMMUNITY): Payer: Medicare Other | Attending: Oncology | Admitting: Hematology & Oncology

## 2015-08-24 ENCOUNTER — Telehealth (HOSPITAL_COMMUNITY): Payer: Self-pay | Admitting: Hematology & Oncology

## 2015-08-24 ENCOUNTER — Encounter (HOSPITAL_COMMUNITY): Payer: Self-pay | Admitting: Hematology & Oncology

## 2015-08-24 VITALS — BP 125/40 | HR 121 | Temp 98.1°F | Resp 24

## 2015-08-24 DIAGNOSIS — R911 Solitary pulmonary nodule: Secondary | ICD-10-CM | POA: Insufficient documentation

## 2015-08-24 DIAGNOSIS — C189 Malignant neoplasm of colon, unspecified: Secondary | ICD-10-CM | POA: Diagnosis not present

## 2015-08-24 DIAGNOSIS — R938 Abnormal findings on diagnostic imaging of other specified body structures: Secondary | ICD-10-CM | POA: Diagnosis not present

## 2015-08-24 DIAGNOSIS — R9389 Abnormal findings on diagnostic imaging of other specified body structures: Secondary | ICD-10-CM

## 2015-08-24 MED ORDER — HYDROCODONE-ACETAMINOPHEN 5-325 MG PO TABS
2.0000 | ORAL_TABLET | Freq: Once | ORAL | Status: AC
  Administered 2015-08-24: 2 via ORAL
  Filled 2015-08-24: qty 2

## 2015-08-24 MED ORDER — HYDROCODONE-ACETAMINOPHEN 5-325 MG PO TABS
ORAL_TABLET | ORAL | Status: AC
  Filled 2015-08-24: qty 1

## 2015-08-24 NOTE — Telephone Encounter (Signed)
FAXED RADIATION CONSULT TO [place]

## 2015-08-24 NOTE — Patient Instructions (Signed)
[place] at [facility] Discharge Instructions  RECOMMENDATIONS MADE BY THE CONSULTANT AND ANY TEST RESULTS WILL BE SENT TO YOUR REFERRING PHYSICIAN.  You saw Dr. Whitney Muse today. You are being referred to [facility] for radiation therapy consult with Dr. [name] for pulmonary nodule. Follow up in 6 weeks with Gershon Mussel  Thank you for choosing [place] at [facility] to provide your oncology and hematology care.  To afford each patient quality time with our provider, please arrive at least 15 minutes before your scheduled appointment time.   Beginning January 23rd 2017 lab work for the [facility] will be done in the  Main lab at [facility] on 1st floor. If you have a lab appointment with the [facility] please come in thru the  Main Entrance and check in at the main information desk  You need to re-schedule your appointment should you arrive 10 or more minutes late.  We strive to give you quality time with our providers, and arriving late affects you and other patients whose appointments are after yours.  Also, if you no show three or more times for appointments you may be dismissed from the clinic at the providers discretion.     Again, thank you for choosing [facility].  Our hope is that these requests will decrease the amount of time that you wait before being seen by our physicians.       _____________________________________________________________  Should you have questions after your visit to [facility], please contact our office at [phone] between the hours of 8:30 a.m. and 4:30 p.m.  Voicemails left after 4:30 p.m. will not be returned until the following business day.  For prescription refill requests, have your pharmacy contact our office.         Resources For Cancer Patients and their Caregivers ? American Cancer Society: Can assist with transportation, wigs, general needs, runs Look Good Feel Better.         912-472-7217 ? Cancer Care: Provides financial assistance, online support groups, medication/co-pay assistance.  1-800-813-HOPE 515-336-7192) ? [place] Assists [place] cancer patients and their families through emotional , educational and financial support.  708-463-7152 ? [place] DSS Where to apply for food stamps, Medicaid and utility assistance. (317) 419-6480 ? [facility]: Transportation to medical appointments. (901)553-3966 ? Social Security Administration: May apply for disability if have a Stage IV cancer. (678)139-7891 367-190-4174 ? [employer], Disability and Transit Services: Assists with nutrition, care and transit needs. [place] Support Programs: '@10RELATIVEDAYS'$ @ > Cancer Support Group  2nd Tuesday of the month 1pm-2pm, Journey Room  > Creative Journey  3rd Tuesday of the month 1130am-1pm, Journey Room  > Look Good Feel Better  1st Wednesday of the month 10am-12 noon, Journey Room (Call Miller to register [phone])

## 2015-08-24 NOTE — Progress Notes (Signed)
[facility] Hematology/Oncology Consultation   Name: Gary Nelson      MRN: [id number]    Date: 08/24/2015 Time:12:52 PM   REFERRING PHYSICIAN:  Audree Camel, MD (Med Onc at [facility])   DIAGNOSIS: RLL pulmonary nodule that is hypermetabolic on PET imaging on 07/01/2015 with a history of Stage II colorectal adenocarcinoma having finished curative treatment in 2014-2015 consisting of partial colectomy on 10/04/2012 by Dr. [name] followed by 6 months worth of adjuvant chemotherapy consisting of FOLFOX with a change in treatment to infusional 5-FU for progressive peripheral neuropathy to finish 6 months worth of treatment by Dr. [name].  HISTORY OF PRESENT ILLNESS:   Gary Nelson is a 75 y.o. 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 [name] who is referred to the [facility] for transfer of oncology care in the setting of a new RLL pulmonary nodule that is hypermetabolic on PET imaging on 07/01/2015.  Chart is reviewed.  In general, in 2014 the patient was diagnosed with a Stage II (T3N0M0)- high risk- adenocarcinoma of colon having finished curative treatment in 2014- 2015 by Dr. [name] after undergoing definitive surgery by Dr. [name] on 10/04/2012.  He was treated with FOLFOX in the adjuvant setting, but was transitioned to 5FU infusional therapy due to progressive neuropathy.  Following treatment, he was followed per NCCN guidelines but in May 2017, CT abd/pelvis imaging demonstrated concerned for a new pulmonary nodule which lead to a PET scan.  PET scan demonstrates hypermetabolic activity.  Unfortunately, his primary oncologist office is closing and therefore, the patient was referred to [place] for further work-up, management of his medical oncology care.  At his last visit he was having significant impairment from upper extremity and RLE  nerve pain. He has just had surgery and is currently recovering in the [place] center. He notes that his RUE is markedly improved. He still needs assistance to walk. He is undergoing rehab.   Today he presents and would like to be referred to [facility] for XRT consultation. His lung lesion has not been biopsied but he is currently still not interested in pursuing this. He did have an appointment with Radiation prior to his neurosurgery but did not attend secondary to his pain issues.  Review of Systems  Constitutional: Negative.  Negative for weight loss.  HENT: Negative.  Negative for ear discharge, ear pain, hearing loss and tinnitus.   Eyes: Negative.  Negative for blurred vision and double vision.  Respiratory: Negative for cough, hemoptysis and sputum production.   Cardiovascular: Positive for leg swelling (chronic). Negative for chest pain.  Gastrointestinal: Negative for nausea, vomiting, abdominal pain, diarrhea, constipation, blood in stool and melena.  Genitourinary: Negative for dysuria, urgency and frequency.  Musculoskeletal: Negative.   Skin: Negative.   Neurological: Positive for sensory change. Negative for headaches.       As per HPI  Endo/Heme/Allergies: Negative.   Psychiatric/Behavioral: Negative.   14 point review of systems was performed and is negative except as detailed under history of present illness and above    PAST MEDICAL HISTORY:   Past Medical History  Diagnosis Date  . Hypertension   . Neuropathy ([place])   . Sleep apnea   . Hematuria   . Cystitis   . Prostatitis   . Pulmonary nodule   . Blood infection ([place])   . Diabetes  mellitus without complication (HCC)     Type II  . DVT (deep venous thrombosis) ([place]) 2014    post op  . Adenocarcinoma of colon ([place])     RLL -  Pulmonary nodule (06/2015, concerning for mets vs primary; to see RAD-ONC Dr. [name]); stage 2 adenocarinoma   2014 - Colon Cancer - surgery and chemo    ALLERGIES: No Known Allergies      MEDICATIONS: I have reviewed the patient's current medications.    Current Outpatient Prescriptions on File Prior to Visit  Medication Sig Dispense Refill  . aspirin EC 81 MG tablet Take 81 mg by mouth daily.    . clotrimazole (LOTRIMIN) 1 % cream Apply 1 application topically daily as needed (rash).     Marland Kitchen docusate sodium (COLACE) 100 MG capsule Take 1 capsule (100 mg total) by mouth 2 (two) times daily. 10 capsule 0  . furosemide (LASIX) 40 MG tablet Take 20 mg by mouth daily.    Marland Kitchen gabapentin (NEURONTIN) 300 MG capsule Take 2 capsules (600 mg total) by mouth 3 (three) times daily. 180 capsule 2  . HYDROcodone-acetaminophen (NORCO) 7.5-325 MG tablet Take 1-2 tablets by mouth every 6 (six) hours as needed for moderate pain. 90 tablet 0  . insulin NPH (HUMULIN N,NOVOLIN N) 100 UNIT/ML injection Inject 15-30 Units into the skin 2 (two) times daily. Take 30 units in the morning and 15 units in the evening.    Marland Kitchen lisinopril (PRINIVIL,ZESTRIL) 40 MG tablet Take 40 mg by mouth daily.    . methocarbamol (ROBAXIN) 750 MG tablet Take 1 tablet (750 mg total) by mouth every 6 (six) hours as needed for muscle spasms. 120 tablet 2  . potassium chloride SA (K-DUR,KLOR-CON) 20 MEQ tablet Take 10 mEq by mouth daily.    . tamsulosin (FLOMAX) 0.4 MG CAPS capsule Take 1 capsule (0.4 mg total) by mouth daily. 30 capsule 2   No current facility-administered medications on file prior to visit.     PAST SURGICAL HISTORY Past Surgical History  Procedure Laterality Date  . Cataract extraction      left  . Back surgery    . Cervical fusion      c4-c5  . Cholecystectomy    . Orif femur fracture      right  . Cataract extraction w/phaco Right 04/11/2012    Procedure: CATARACT EXTRACTION PHACO AND INTRAOCULAR LENS PLACEMENT (IOC);  Surgeon: Tonny Branch, MD;  Location: [facility] ORS;  Service: Ophthalmology;  Laterality: Right;  CDE:62.48  . Leg surgery    . Givens capsule study N/A 03/15/2015    Procedure: GIVENS CAPSULE  STUDY;  Surgeon: Rogene Houston, MD;  Location: [facility] ENDO SUITE;  Service: Endoscopy;  Laterality: N/A;  730  . Eye surgery      cataract  . Colon surgery  09/29/12    Colon resection for cancer  . Colonoscopy    . Posterior cervical fusion/foraminotomy N/A 07/27/2015    Procedure: Cervical four to Cervical seven Laminectomy with Cervical four to Thoracic one Dorsal internal fixation and fusion;  Surgeon: Kevan Ny Ditty, MD;  Location: [facility] NEURO ORS;  Service: Neurosurgery;  Laterality: N/A;  C4 to C7 Laminectomy with C4 to T1 Dorsal internal fixation and fusion    FAMILY HISTORY: Family History  Problem Relation Age of Onset  . Heart disease Father   . Heart attack Father   . Alzheimer's disease Mother     SOCIAL HISTORY:  reports that he has quit  smoking. His smoking use included Cigarettes. He started smoking about 60 years ago. He has a 55 pack-year smoking history. He has never used smokeless tobacco. He reports that he drinks about 1.8 oz of alcohol per week. He reports that he does not use illicit drugs.  Social History   Social History  . Marital Status: Single    Spouse Name: N/A  . Number of Children: N/A  . Years of Education: N/A   Social History Main Topics  . Smoking status: Former Smoker -- 1.00 packs/day for 55 years    Types: Cigarettes    Start date: 02/07/1955  . Smokeless tobacco: Never Used     Comment: quit 2015  . Alcohol Use: 1.8 oz/week    3 Cans of beer per week  . Drug Use: No  . Sexual Activity: Yes    Birth Control/ Protection: None   Other Topics Concern  . None   Social History Narrative  Single with no children. Presents today with his brother.   PERFORMANCE STATUS: The patient's performance status is 3 - Symptomatic, >50% confined to bed  PHYSICAL EXAM: Most Recent Vital Signs: Blood pressure 125/40, pulse 121, temperature 98.1 F (36.7 C), temperature source Oral, resp. rate 24, SpO2 96 %. General appearance: alert, cooperative,  appears stated age, no distress, morbidly obese and accompanied by his brother Head: Normocephalic, without obvious abnormality, atraumatic Throat: normal findings: buccal mucosa normal and oropharynx pink & moist without lesions or evidence of thrush Neck: no adenopathy and supple, symmetrical, trachea midline Lungs: clear to auscultation bilaterally and normal percussion bilaterally Heart: regular rate and rhythm, S1, S2 normal, no murmur, click, rub or gallop Abdomen: soft, non-tender; bowel sounds normal; no masses,  no organomegaly and obese Extremities: venous stasis dermatitis noted Skin: texture normal  Well healing incision site on cervical spine Lymph nodes: Cervical, supraclavicular, and axillary nodes normal. Neurologic: not ambulated but moves both UE, CN II - XII grossly intact  LABORATORY DATA:   Results for FRAZIER, BALFOUR (MRN [id number])   Ref. Range 07/15/2015 12:34  Sodium Latest Ref Range: 135-145 mmol/L 136  Potassium Latest Ref Range: 3.5-5.1 mmol/L 5.0  Chloride Latest Ref Range: 101-111 mmol/L 107  CO2 Latest Ref Range: 22-32 mmol/L 23  BUN Latest Ref Range: 6-20 mg/dL 22 (H)  Creatinine Latest Ref Range: 0.61-1.24 mg/dL 0.96  Calcium Latest Ref Range: 8.9-10.3 mg/dL 9.0  EGFR (Non-African Amer.) Latest Ref Range: >60 mL/min >60  EGFR (African American) Latest Ref Range: >60 mL/min >60  Glucose Latest Ref Range: 65-99 mg/dL 118 (H)  Anion gap Latest Ref Range: 5-15  6  Alkaline Phosphatase Latest Ref Range: 38-126 U/L 107  Albumin Latest Ref Range: 3.5-5.0 g/dL 3.6  AST Latest Ref Range: 15-41 U/L 18  ALT Latest Ref Range: 17-63 U/L 20  Total Protein Latest Ref Range: 6.5-8.1 g/dL 6.9  Total Bilirubin Latest Ref Range: 0.3-1.2 mg/dL 0.4  WBC Latest Ref Range: 4.0-10.5 K/uL 8.5  RBC Latest Ref Range: 4.22-5.81 MIL/uL 4.04 (L)  Hemoglobin Latest Ref Range: 13.0-17.0 g/dL 12.8 (L)  HCT Latest Ref Range: 39.0-52.0 % 38.0 (L)  MCV Latest Ref Range: 78.0-100.0 fL  94.1  MCH Latest Ref Range: 26.0-34.0 pg 31.7  MCHC Latest Ref Range: 30.0-36.0 g/dL 33.7  RDW Latest Ref Range: 11.5-15.5 % 13.7  Platelets Latest Ref Range: 150-400 K/uL 276  Neutrophils Latest Units: % 70  Lymphocytes Latest Units: % 17  Monocytes Relative Latest Units: % 11  Eosinophil Latest Units: %  1  Basophil Latest Units: % 1  NEUT# Latest Ref Range: 1.7-7.7 K/uL 5.9  Lymphocyte # Latest Ref Range: 0.7-4.0 K/uL 1.5  Monocyte # Latest Ref Range: 0.1-1.0 K/uL 1.0  Eosinophils Absolute Latest Ref Range: 0.0-0.7 K/uL 0.1  Basophils Absolute Latest Ref Range: 0.0-0.1 K/uL 0.0     RADIOGRAPHY:  CLINICAL DATA: Numbness in the hands and legs. Prior cervical surgery.  EXAM: MRI CERVICAL SPINE WITHOUT CONTRAST  TECHNIQUE: Multiplanar, multisequence MR imaging of the cervical spine was performed. No intravenous contrast was administered.  COMPARISON: None.  FINDINGS: Alignment: Physiologic.  Vertebrae: No fracture, evidence of discitis, or bone lesion.  Cord: Normal signal and morphology.  Posterior Fossa, vertebral arteries, paraspinal tissues: Negative.  Disc levels:  Discs: Anterior cervical fusion from C3 through C5 without failure complication.  C2-3: No significant disc bulge. No neural foraminal stenosis. No central canal stenosis.  C3-4: Interbody fusion. No neural foraminal stenosis. No central canal stenosis.  C4-5: Interbody fusion. Moderate right foraminal narrowing. No left foraminal narrowing. No central canal stenosis.  C5-6: Broad-based disc bulge with a right paracentral disc protrusion deforming the spinal cord. Bilateral uncovertebral degenerative changes with moderate right foraminal stenosis. No left foraminal stenosis. Moderate spinal stenosis.  C6-7: Central disc protrusion slightly eccentric towards the right and mildly deforming the ventral cervical spinal cord. No neural foraminal stenosis. Mild spinal  stenosis.  C7-T1: No significant disc bulge. No neural foraminal stenosis. No central canal stenosis.  IMPRESSION: 1. At C5-6 there is a broad-based disc bulge with a right paracentral disc protrusion deforming the spinal cord. Bilateral uncovertebral degenerative changes with moderate right foraminal stenosis. No left foraminal stenosis. Moderate spinal stenosis. 2. At C6-7 there is a central disc protrusion slightly eccentric towards the right and mildly deforming the ventral cervical spinal cord. Mild spinal stenosis.   Electronically Signed  By: Kathreen Devoid  On: 07/06/2015 11:43    CLINICAL DATA: Subsequent Treatment strategy for colon cancer.  EXAM: NUCLEAR MEDICINE PET SKULL BASE TO THIGH  TECHNIQUE: 13.9 mCi F-18 FDG was injected intravenously. Full-ring PET imaging was performed from the skull base to thigh after the radiotracer. CT data was obtained and used for attenuation correction and anatomic localization.  FASTING BLOOD GLUCOSE: Value: 131 mg/dl  COMPARISON: 06/23/2015  FINDINGS: NECK  No hypermetabolic lymph nodes in the neck.  CHEST  The heart size is normal. There is aortic atherosclerosis noted. Calcification within the RCA, LAD and left circumflex coronary arteries noted. There is no pleural effusion identified. There is a nodule within the posterior and medial right lower lobe which measures 6 mm. SUV max equals 5.23. The subpleural nodule referenced on previous CT is not visualized on today's examination.  ABDOMEN/PELVIS  No abnormal hypermetabolic activity within the liver, pancreas, adrenal glands, or spleen. No hypermetabolic lymph nodes in the abdomen or pelvis.  SKELETON  There is a indeterminate focus of increased uptake localizing to the left femoral neck. SUV max equals 7.24. On the corresponding CT images no definitive lytic or sclerotic bone lesion identified.  IMPRESSION: 1. There is malignant range  FDG uptake associated with the right lower lobe pulmonary nodule suspicious for either metastatic disease or primary pulmonary neoplasm. 2. Nonspecific focus of increased uptake localizing to the left femoral neck. No corresponding CT abnormality. Cannot rule out bone metastasis. If further imaging is clinically indicated then a MRI with contrast of the left hip may be helpful for further assessment. 3. Aortic atherosclerosis 4. Multi vessel coronary artery calcification.  Electronically Signed  By: Kerby Moors M.D.  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   Abnormality in left femur does not correlate with CT imaging.  We will not pursue this further at this time as he is asymptomatic, and suspicion is low.  Case discussed with IR, Dr. Geroge Baseman who agrees that the site of this pulmonary lesion is amenable to CT-guided biopsy by IR.  HOWEVER,this was previously discussed and not pursued because of patient's neurologic difficulties. At this time he is still not interested in pursuing he does however want to meet with Radiation Oncology.  Case was previously discussed with Dr. [name]. Will refer the patient back to him.   Adenocarcinoma of colon ([place]) Stage II (T3N0M0) adenocarcinoma having finished curative treatment in 2014-2015 consisting of partial colectomy on 10/04/2012 by Dr. [name] followed by 6 months worth of adjuvant chemotherapy consisting of FOLFOX with a change in treatment to infusional 5-FU for progressive peripheral neuropathy to finish 6 months worth of treatment by Dr. [name].  Oncology history developed. Last Colonoscopy in January 2016 with Dr. Anthony Sar in [place].   Staging in [facility] problem list is  completed with current data.  All questions were answered. The patient knows to call the clinic with any problems, questions or concerns. We can certainly see the patient much sooner if necessary.  This note is electronically signed by: Molli Hazard, MD   08/24/2015 12:52 PM

## 2015-08-26 ENCOUNTER — Telehealth (HOSPITAL_COMMUNITY): Payer: Self-pay | Admitting: Hematology & Oncology

## 2015-08-26 NOTE — Telephone Encounter (Signed)
Faxed ref to [facility]

## 2015-08-28 DIAGNOSIS — G8250 Quadriplegia, unspecified: Secondary | ICD-10-CM | POA: Diagnosis not present

## 2015-08-28 DIAGNOSIS — I10 Essential (primary) hypertension: Secondary | ICD-10-CM | POA: Diagnosis not present

## 2015-08-28 DIAGNOSIS — M4712 Other spondylosis with myelopathy, cervical region: Secondary | ICD-10-CM | POA: Diagnosis not present

## 2015-08-28 DIAGNOSIS — G4734 Idiopathic sleep related nonobstructive alveolar hypoventilation: Secondary | ICD-10-CM | POA: Diagnosis not present

## 2015-08-28 DIAGNOSIS — R39198 Other difficulties with micturition: Secondary | ICD-10-CM | POA: Diagnosis not present

## 2015-09-09 DIAGNOSIS — Z85038 Personal history of other malignant neoplasm of large intestine: Secondary | ICD-10-CM | POA: Diagnosis not present

## 2015-09-09 DIAGNOSIS — Z7982 Long term (current) use of aspirin: Secondary | ICD-10-CM | POA: Diagnosis not present

## 2015-09-09 DIAGNOSIS — E1140 Type 2 diabetes mellitus with diabetic neuropathy, unspecified: Secondary | ICD-10-CM | POA: Diagnosis not present

## 2015-09-09 DIAGNOSIS — Z86718 Personal history of other venous thrombosis and embolism: Secondary | ICD-10-CM | POA: Diagnosis not present

## 2015-09-09 DIAGNOSIS — C7801 Secondary malignant neoplasm of right lung: Secondary | ICD-10-CM | POA: Diagnosis not present

## 2015-09-09 DIAGNOSIS — R911 Solitary pulmonary nodule: Secondary | ICD-10-CM | POA: Diagnosis not present

## 2015-09-09 DIAGNOSIS — I10 Essential (primary) hypertension: Secondary | ICD-10-CM | POA: Diagnosis not present

## 2015-09-09 DIAGNOSIS — F17210 Nicotine dependence, cigarettes, uncomplicated: Secondary | ICD-10-CM | POA: Diagnosis not present

## 2015-09-10 ENCOUNTER — Other Ambulatory Visit: Payer: Self-pay | Admitting: Radiation Oncology

## 2015-09-10 DIAGNOSIS — R911 Solitary pulmonary nodule: Secondary | ICD-10-CM

## 2015-09-15 ENCOUNTER — Other Ambulatory Visit: Payer: Self-pay | Admitting: Radiology

## 2015-09-16 ENCOUNTER — Ambulatory Visit (HOSPITAL_COMMUNITY)
Admission: RE | Admit: 2015-09-16 | Discharge: 2015-09-16 | Disposition: A | Discharge status: Home / Self Care | Payer: Medicare Other | Source: Ambulatory Visit | Attending: Diagnostic Radiology | Admitting: Diagnostic Radiology

## 2015-09-16 ENCOUNTER — Ambulatory Visit (HOSPITAL_COMMUNITY)
Admission: RE | Admit: 2015-09-16 | Discharge: 2015-09-16 | Disposition: A | Discharge status: Home / Self Care | Payer: Medicare Other | Source: Ambulatory Visit | Attending: Radiation Oncology | Admitting: Radiation Oncology

## 2015-09-16 ENCOUNTER — Encounter (HOSPITAL_COMMUNITY): Payer: Self-pay

## 2015-09-16 DIAGNOSIS — I10 Essential (primary) hypertension: Secondary | ICD-10-CM | POA: Diagnosis not present

## 2015-09-16 DIAGNOSIS — R911 Solitary pulmonary nodule: Secondary | ICD-10-CM | POA: Diagnosis not present

## 2015-09-16 DIAGNOSIS — Z85038 Personal history of other malignant neoplasm of large intestine: Secondary | ICD-10-CM | POA: Insufficient documentation

## 2015-09-16 DIAGNOSIS — E1142 Type 2 diabetes mellitus with diabetic polyneuropathy: Secondary | ICD-10-CM | POA: Diagnosis not present

## 2015-09-16 DIAGNOSIS — G4730 Sleep apnea, unspecified: Secondary | ICD-10-CM | POA: Insufficient documentation

## 2015-09-16 DIAGNOSIS — Z9889 Other specified postprocedural states: Secondary | ICD-10-CM | POA: Insufficient documentation

## 2015-09-16 DIAGNOSIS — R918 Other nonspecific abnormal finding of lung field: Secondary | ICD-10-CM | POA: Diagnosis not present

## 2015-09-16 DIAGNOSIS — Z87891 Personal history of nicotine dependence: Secondary | ICD-10-CM | POA: Insufficient documentation

## 2015-09-16 DIAGNOSIS — Z79899 Other long term (current) drug therapy: Secondary | ICD-10-CM | POA: Insufficient documentation

## 2015-09-16 DIAGNOSIS — C7801 Secondary malignant neoplasm of right lung: Secondary | ICD-10-CM | POA: Diagnosis not present

## 2015-09-16 DIAGNOSIS — Z794 Long term (current) use of insulin: Secondary | ICD-10-CM | POA: Insufficient documentation

## 2015-09-16 DIAGNOSIS — Z7982 Long term (current) use of aspirin: Secondary | ICD-10-CM | POA: Insufficient documentation

## 2015-09-16 LAB — APTT: aPTT: 29 seconds (ref 24–36)

## 2015-09-16 LAB — CBC
HCT: 39.3 % (ref 39.0–52.0)
Hemoglobin: 12.1 g/dL — ABNORMAL LOW (ref 13.0–17.0)
MCH: 29.7 pg (ref 26.0–34.0)
MCHC: 30.8 g/dL (ref 30.0–36.0)
MCV: 96.6 fL (ref 78.0–100.0)
Platelets: 371 10*3/uL (ref 150–400)
RBC: 4.07 MIL/uL — ABNORMAL LOW (ref 4.22–5.81)
RDW: 15.2 % (ref 11.5–15.5)
WBC: 9.1 10*3/uL (ref 4.0–10.5)

## 2015-09-16 LAB — GLUCOSE, CAPILLARY
Glucose-Capillary: 93 mg/dL (ref 65–99)
Glucose-Capillary: 80 mg/dL (ref 65–99)

## 2015-09-16 LAB — PROTIME-INR
INR: 1.03
Prothrombin Time: 13.5 seconds (ref 11.4–15.2)

## 2015-09-16 MED ORDER — LIDOCAINE HCL (PF) 1 % IJ SOLN
INTRAMUSCULAR | Status: AC
Start: 2015-09-16 — End: 2015-09-16
  Filled 2015-09-16: qty 30

## 2015-09-16 MED ORDER — FENTANYL CITRATE (PF) 100 MCG/2ML IJ SOLN
INTRAMUSCULAR | Status: AC
  Filled 2015-09-16: qty 2

## 2015-09-16 MED ORDER — NALOXONE HCL 0.4 MG/ML IJ SOLN
INTRAMUSCULAR | Status: AC
  Filled 2015-09-16: qty 1

## 2015-09-16 MED ORDER — MIDAZOLAM HCL 2 MG/2ML IJ SOLN
INTRAMUSCULAR | Status: AC
Start: 2015-09-16 — End: 2015-09-16
  Filled 2015-09-16: qty 2

## 2015-09-16 MED ORDER — HYDROCODONE-ACETAMINOPHEN 5-325 MG PO TABS
1.0000 | ORAL_TABLET | ORAL | Status: DC | PRN
  Filled 2015-09-16: qty 2

## 2015-09-16 MED ORDER — HEPARIN SOD (PORK) LOCK FLUSH 100 UNIT/ML IV SOLN
500.0000 [IU] | INTRAVENOUS | Status: AC | PRN
Start: 2015-09-16 — End: 2015-09-16
  Administered 2015-09-16: 500 [IU]

## 2015-09-16 MED ORDER — SODIUM CHLORIDE 0.9 % IV SOLN: INTRAVENOUS | Status: DC

## 2015-09-16 MED ORDER — FENTANYL CITRATE (PF) 100 MCG/2ML IJ SOLN
INTRAMUSCULAR | Status: AC | PRN
  Administered 2015-09-16: 50 ug via INTRAVENOUS

## 2015-09-16 MED ORDER — MIDAZOLAM HCL 2 MG/2ML IJ SOLN
INTRAMUSCULAR | Status: AC | PRN
  Administered 2015-09-16: 1 mg via INTRAVENOUS

## 2015-09-16 MED ORDER — FLUMAZENIL 0.5 MG/5ML IV SOLN
INTRAVENOUS | Status: AC
  Filled 2015-09-16: qty 5

## 2015-09-16 NOTE — Final Progress Note (Signed)
CXR normal, pt taking food and liquid without difficulty.

## 2015-09-16 NOTE — Procedures (Signed)
CT guided core biopsy of right lower lobe nodule.  3 cores obtained.  Minimal blood loss and no immediate complication.  See full report in PACS.

## 2015-09-16 NOTE — Progress Notes (Signed)
Called to radiology holding area to deaccess RT chest PAC.   Per pt he was, "Discharged from [facility] with that thing in there".   "They have been flushing it at the [facility] at [facility] before they closed the [place]"   "It hasn't been giving a blood return for them"    I was able to flush it with 10cc NS and 500 units Heparin easily however was not able to obtain a blood return.  Deaccessed port.   Radiology staff had started a peripheral IV for a lung biopsy in RT A/C.

## 2015-09-16 NOTE — Discharge Instructions (Signed)

## 2015-09-16 NOTE — H&P (Signed)
Chief Complaint: Patient was seen in consultation today for right lung mass biopsy at the request of [place]  Referring Physician(s): Moody,John Dr Ancil Linsey  Supervising Physician: Markus Daft  Patient Status: Outpatient  History of Present Illness: Gary Nelson is a 75 y.o. male   Hx Colon Ca Chemo and radiation with Dr [name] and Dr [name] Follow up CT scan 06/23/15 Revealed Right lung mass PET 07/01/15 IMPRESSION: 1. There is malignant range FDG uptake associated with the right lower lobe pulmonary nodule suspicious for either metastatic disease or primary pulmonary neoplasm. 2. Nonspecific focus of increased uptake localizing to the left femoral neck. No corresponding CT abnormality. Cannot rule out bone metastasis. If further imaging is clinically indicated then a MRI with contrast of the left hip may be helpful for further assessment. 3. Aortic atherosclerosis 4. Multi vessel coronary artery calcification.  Now scheduled for Rt lung mass biopsy  Pt has had recent spine surgery--- Now able to schedule lung mass bx  Past Medical History:  Diagnosis Date  . 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 [name]); stage 2 adenocarinoma   2014 - Colon Cancer - surgery and chemo  . Blood infection ([place])   . Cystitis   . Diabetes mellitus without complication ([place])    Type II  . DVT (deep venous thrombosis) ([place]) 2014   post op  . Hematuria   . Hypertension   . Neuropathy ([place])   . Prostatitis   . Pulmonary nodule   . Sleep apnea     Past Surgical History:  Procedure Laterality Date  . BACK SURGERY    . CATARACT EXTRACTION     left  . CATARACT EXTRACTION W/PHACO Right 04/11/2012   Procedure: CATARACT EXTRACTION PHACO AND INTRAOCULAR LENS PLACEMENT (IOC);  Surgeon: Tonny Branch, MD;  Location: [facility];  Service: Ophthalmology;  Laterality: Right;  CDE:62.48  . CERVICAL FUSION     c4-c5  . CHOLECYSTECTOMY    . COLON SURGERY  09/29/12   Colon resection for cancer  . COLONOSCOPY    . EYE SURGERY     cataract  . GIVENS CAPSULE STUDY N/A 03/15/2015   Procedure: GIVENS CAPSULE STUDY;  Surgeon: Rogene Houston, MD;  Location: [facility] SUITE;  Service: Endoscopy;  Laterality: N/A;  730  . LEG SURGERY    . ORIF FEMUR FRACTURE     right  . POSTERIOR CERVICAL FUSION/FORAMINOTOMY N/A 07/27/2015   Procedure: Cervical four to Cervical seven Laminectomy with Cervical four to Thoracic one Dorsal internal fixation and fusion;  Surgeon: Kevan Ny Ditty, MD;  Location: [place] [facility] ORS;  Service: Neurosurgery;  Laterality: N/A;  C4 to C7 Laminectomy with C4 to T1 Dorsal internal fixation and fusion    Allergies: Review of patient's allergies indicates no known allergies.  Medications: Prior to Admission medications   Medication Sig Start Date End Date Taking? Authorizing Provider  aspirin EC 81 MG tablet Take 81 mg by mouth daily.   Yes Historical Provider, MD  docusate sodium (COLACE) 100 MG capsule Take 1 capsule (100 mg total) by mouth 2 (two) times daily. 07/31/15  Yes Kevan Ny Ditty, MD  furosemide (LASIX) 40 MG tablet Take 20 mg by mouth daily.   Yes Historical Provider, MD  gabapentin (NEURONTIN) 300 MG capsule Take 2 capsules (600 mg total) by mouth 3 (three) times daily. 07/31/15  Yes Tamala Fothergill, MD  HYDROcodone-acetaminophen (NORCO) 7.5-325 MG tablet Take 1-2 tablets by mouth every 6 (six) hours as needed for moderate pain. Patient taking differently: Take 1 tablet by mouth every 6 (six) hours as needed for moderate pain.  07/31/15  Yes Kevan Ny Ditty, MD  insulin NPH (HUMULIN N,NOVOLIN N) 100 UNIT/ML injection Inject 15-30 Units into the skin See admin instructions. 30 units every morning and 15 units in the evening   Yes Historical Provider, MD  lisinopril (PRINIVIL,ZESTRIL) 40 MG tablet Take 40 mg by mouth daily.   Yes Historical Provider, MD  magnesium  hydroxide (MILK OF MAGNESIA) 400 MG/5ML suspension Take 30 mLs by mouth daily as needed for mild constipation.   Yes Historical Provider, MD  methocarbamol (ROBAXIN) 750 MG tablet Take 1 tablet (750 mg total) by mouth every 6 (six) hours as needed for muscle spasms. 07/31/15  Yes Kevan Ny Ditty, MD  OXYGEN Inhale 2 L into the lungs continuous.   Yes Historical Provider, MD  tamsulosin (FLOMAX) 0.4 MG CAPS capsule Take 1 capsule (0.4 mg total) by mouth daily. 07/31/15  Yes Kevan Ny Ditty, MD  clotrimazole (LOTRIMIN) 1 % cream Apply 1 application topically daily as needed (rash).     Historical Provider, MD  potassium chloride SA (K-DUR,KLOR-CON) 20 MEQ tablet Take 10 mEq by mouth daily.    Historical Provider, MD     Family History  Problem Relation Age of Onset  . Heart disease Father   . Heart attack Father   . Alzheimer's disease Mother     Social History   Social History  . Marital status: Single    Spouse name: N/A  . Number of children: N/A  . Years of education: N/A   Social History Main Topics  . Smoking status: Former Smoker    Packs/day: 1.00    Years: 55.00    Types: Cigarettes    Start date: 02/07/1955  . Smokeless tobacco: Never Used     Comment: quit 2015  . Alcohol use 1.8 oz/week    3 Cans of beer per week  . Drug use: No  . Sexual activity: Yes    Birth control/ protection: None   Other Topics Concern  . None   Social History Narrative  . None     Review of Systems: A 12 point ROS discussed and pertinent positives are indicated in the HPI above.  All other systems are negative.  Review of Systems  Constitutional: Positive for activity change. Negative for appetite change, fatigue and fever.  Respiratory: Negative for cough and shortness of breath.   Gastrointestinal: Negative for abdominal pain.  Musculoskeletal: Positive for back pain and gait problem.  Neurological: Positive for weakness.  Psychiatric/Behavioral: Negative for behavioral  problems and confusion.    Vital Signs: BP 129/67   Pulse (!) 107   Temp 97.6 F (36.4 C)   Resp 20   Ht '5\' 5"'$  (1.651 m)   Wt 275 lb (124.7 kg)   SpO2 95%   BMI 45.76 kg/m   Physical Exam  Constitutional: He is oriented to person, place, and time.  Cardiovascular: Normal rate, regular rhythm and normal heart sounds.   Pulmonary/Chest: Effort normal and breath sounds normal. He has no wheezes.  Abdominal: Soft. Bowel sounds are normal. There is no tenderness.  Musculoskeletal: Normal range of motion.  Neurological: He is alert and oriented to person, place, and time.  Skin: Skin is warm and dry.  Healing scar at cervical/thoacic spine area  Psychiatric: He  has a normal mood and affect. His behavior is normal. Judgment and thought content normal.  Nursing note and vitals reviewed.   Mallampati Score:  MD Evaluation Airway: WNL Heart: WNL Abdomen: WNL Chest/ Lungs: WNL ASA  Classification: 3 Mallampati/Airway Score: One  Imaging: No results found.  Labs:  CBC:  Recent Labs  07/15/15 1234 07/27/15 1012 07/28/15 0546 09/16/15 0945  WBC 8.5 7.3 9.9 9.1  HGB 12.8* 12.2* 10.4* 12.1*  HCT 38.0* 37.8* 31.6* 39.3  PLT 276 283 233 371    COAGS:  Recent Labs  09/16/15 0945  INR 1.03  APTT 29    BMP:  Recent Labs  07/15/15 1234 07/27/15 1012 07/28/15 0546 07/29/15 0522  NA 136 136 134* 137  K 5.0 4.6 5.2* 4.5  CL 107 109 105 105  CO2 23 20* 24 26  GLUCOSE 118* 102* 166* 93  BUN 22* 24* 22* 19  CALCIUM 9.0 8.9 8.2* 8.5*  CREATININE 0.96 1.06 1.26* 1.33*  GFRNONAA >60 >60 55* 51*  GFRAA >60 >60 >60 60*    LIVER FUNCTION TESTS:  Recent Labs  07/15/15 1234  BILITOT 0.4  AST 18  ALT 20  ALKPHOS 107  PROT 6.9  ALBUMIN 3.6    TUMOR MARKERS:  Recent Labs  07/15/15 1234  CEA 3.6    Assessment and Plan:  Hx Colon Ca New +PET Rt lung mass Now for biopsy of same Risks and Benefits discussed with the patient including, but not  limited to bleeding, hemoptysis, respiratory failure requiring intubation, infection, pneumothorax requiring chest tube placement, stroke from air embolism or even death. All of the patient's questions were answered, patient is agreeable to proceed. Consent signed and in chart.   Thank you for this interesting consult.  I greatly enjoyed meeting Gary Nelson and look forward to participating in their care.  A copy of this report was sent to the requesting provider on this date.  Electronically Signed: Monia Sabal A 09/16/2015, 10:29 AM   I spent a total of  30 Minutes   in face to face in clinical consultation, greater than 50% of which was counseling/coordinating care for right lung mass biopsy

## 2015-09-24 ENCOUNTER — Encounter: Payer: Self-pay | Admitting: Radiation Oncology

## 2015-09-24 NOTE — Progress Notes (Signed)
Patient will be seen in [place], called patient and informed him, everything was faxed and confirmation received 8/18

## 2015-09-28 DIAGNOSIS — I872 Venous insufficiency (chronic) (peripheral): Secondary | ICD-10-CM | POA: Diagnosis not present

## 2015-09-28 DIAGNOSIS — G959 Disease of spinal cord, unspecified: Secondary | ICD-10-CM | POA: Diagnosis not present

## 2015-09-28 DIAGNOSIS — I10 Essential (primary) hypertension: Secondary | ICD-10-CM | POA: Diagnosis not present

## 2015-09-28 DIAGNOSIS — E119 Type 2 diabetes mellitus without complications: Secondary | ICD-10-CM | POA: Diagnosis not present

## 2015-09-28 DIAGNOSIS — M160 Bilateral primary osteoarthritis of hip: Secondary | ICD-10-CM | POA: Diagnosis not present

## 2015-09-28 DIAGNOSIS — D381 Neoplasm of uncertain behavior of trachea, bronchus and lung: Secondary | ICD-10-CM | POA: Diagnosis not present

## 2015-09-28 DIAGNOSIS — M25552 Pain in left hip: Secondary | ICD-10-CM | POA: Diagnosis not present

## 2015-09-30 DIAGNOSIS — I10 Essential (primary) hypertension: Secondary | ICD-10-CM | POA: Diagnosis not present

## 2015-09-30 DIAGNOSIS — Z86718 Personal history of other venous thrombosis and embolism: Secondary | ICD-10-CM | POA: Diagnosis not present

## 2015-09-30 DIAGNOSIS — Z85038 Personal history of other malignant neoplasm of large intestine: Secondary | ICD-10-CM | POA: Diagnosis not present

## 2015-09-30 DIAGNOSIS — C7801 Secondary malignant neoplasm of right lung: Secondary | ICD-10-CM | POA: Diagnosis not present

## 2015-09-30 DIAGNOSIS — Z7982 Long term (current) use of aspirin: Secondary | ICD-10-CM | POA: Diagnosis not present

## 2015-09-30 DIAGNOSIS — E1140 Type 2 diabetes mellitus with diabetic neuropathy, unspecified: Secondary | ICD-10-CM | POA: Diagnosis not present

## 2015-10-01 ENCOUNTER — Ambulatory Visit
Admission: RE | Admit: 2015-10-01 | Discharge: 2015-10-01 | Disposition: A | Discharge status: Home / Self Care | Payer: Medicare Other | Source: Ambulatory Visit | Attending: Radiation Oncology | Admitting: Radiation Oncology

## 2015-10-01 DIAGNOSIS — Z794 Long term (current) use of insulin: Secondary | ICD-10-CM | POA: Diagnosis not present

## 2015-10-01 DIAGNOSIS — C3431 Malignant neoplasm of lower lobe, right bronchus or lung: Secondary | ICD-10-CM | POA: Diagnosis not present

## 2015-10-01 DIAGNOSIS — Z79899 Other long term (current) drug therapy: Secondary | ICD-10-CM | POA: Diagnosis not present

## 2015-10-01 DIAGNOSIS — C7901 Secondary malignant neoplasm of right kidney and renal pelvis: Secondary | ICD-10-CM | POA: Insufficient documentation

## 2015-10-01 DIAGNOSIS — Z7982 Long term (current) use of aspirin: Secondary | ICD-10-CM | POA: Insufficient documentation

## 2015-10-01 DIAGNOSIS — Z510 Encounter for antineoplastic radiation therapy: Secondary | ICD-10-CM | POA: Diagnosis not present

## 2015-10-01 DIAGNOSIS — C7801 Secondary malignant neoplasm of right lung: Secondary | ICD-10-CM

## 2015-10-05 ENCOUNTER — Encounter (HOSPITAL_COMMUNITY): Payer: Self-pay | Admitting: Oncology

## 2015-10-05 ENCOUNTER — Encounter (HOSPITAL_COMMUNITY): Payer: Medicare Other

## 2015-10-05 ENCOUNTER — Telehealth (HOSPITAL_COMMUNITY): Payer: Self-pay | Admitting: *Deleted

## 2015-10-05 ENCOUNTER — Encounter (HOSPITAL_COMMUNITY): Payer: Medicare Other | Attending: Oncology | Admitting: Oncology

## 2015-10-05 DIAGNOSIS — G620 Drug-induced polyneuropathy: Secondary | ICD-10-CM

## 2015-10-05 DIAGNOSIS — Z510 Encounter for antineoplastic radiation therapy: Secondary | ICD-10-CM | POA: Diagnosis not present

## 2015-10-05 DIAGNOSIS — C189 Malignant neoplasm of colon, unspecified: Secondary | ICD-10-CM

## 2015-10-05 DIAGNOSIS — C7901 Secondary malignant neoplasm of right kidney and renal pelvis: Secondary | ICD-10-CM | POA: Diagnosis not present

## 2015-10-05 DIAGNOSIS — C7801 Secondary malignant neoplasm of right lung: Secondary | ICD-10-CM

## 2015-10-05 DIAGNOSIS — C3431 Malignant neoplasm of lower lobe, right bronchus or lung: Secondary | ICD-10-CM | POA: Diagnosis not present

## 2015-10-05 DIAGNOSIS — Z794 Long term (current) use of insulin: Secondary | ICD-10-CM | POA: Diagnosis not present

## 2015-10-05 DIAGNOSIS — R938 Abnormal findings on diagnostic imaging of other specified body structures: Secondary | ICD-10-CM | POA: Diagnosis not present

## 2015-10-05 DIAGNOSIS — R911 Solitary pulmonary nodule: Secondary | ICD-10-CM | POA: Diagnosis not present

## 2015-10-05 DIAGNOSIS — Z7982 Long term (current) use of aspirin: Secondary | ICD-10-CM | POA: Diagnosis not present

## 2015-10-05 DIAGNOSIS — Z79899 Other long term (current) drug therapy: Secondary | ICD-10-CM | POA: Diagnosis not present

## 2015-10-05 LAB — CBC WITH DIFFERENTIAL/PLATELET
Basophils Absolute: 0.1 10*3/uL (ref 0.0–0.1)
Basophils Relative: 1 %
Eosinophils Absolute: 0.2 10*3/uL (ref 0.0–0.7)
Eosinophils Relative: 2 %
HCT: 39.8 % (ref 39.0–52.0)
Hemoglobin: 12.5 g/dL — ABNORMAL LOW (ref 13.0–17.0)
Lymphocytes Relative: 14 %
Lymphs Abs: 1.4 10*3/uL (ref 0.7–4.0)
MCH: 29.3 pg (ref 26.0–34.0)
MCHC: 31.4 g/dL (ref 30.0–36.0)
MCV: 93.2 fL (ref 78.0–100.0)
Monocytes Absolute: 1 10*3/uL (ref 0.1–1.0)
Monocytes Relative: 10 %
Neutro Abs: 7.1 10*3/uL (ref 1.7–7.7)
Neutrophils Relative %: 73 %
Platelets: 327 10*3/uL (ref 150–400)
RBC: 4.27 MIL/uL (ref 4.22–5.81)
RDW: 14.4 % (ref 11.5–15.5)
WBC: 9.7 10*3/uL (ref 4.0–10.5)

## 2015-10-05 LAB — COMPREHENSIVE METABOLIC PANEL
ALT: 17 U/L (ref 17–63)
AST: 21 U/L (ref 15–41)
Albumin: 3.5 g/dL (ref 3.5–5.0)
Alkaline Phosphatase: 73 U/L (ref 38–126)
Anion gap: 9 (ref 5–15)
BUN: 29 mg/dL — ABNORMAL HIGH (ref 6–20)
CO2: 23 mmol/L (ref 22–32)
Calcium: 8.7 mg/dL — ABNORMAL LOW (ref 8.9–10.3)
Chloride: 103 mmol/L (ref 101–111)
Creatinine, Ser: 1.25 mg/dL — ABNORMAL HIGH (ref 0.61–1.24)
GFR calc Af Amer: >60 mL/min (ref >60)
GFR calc non Af Amer: 55 mL/min — ABNORMAL LOW (ref >60)
Glucose, Bld: 161 mg/dL — ABNORMAL HIGH (ref 65–99)
Potassium: 5.2 mmol/L — ABNORMAL HIGH (ref 3.5–5.1)
Sodium: 135 mmol/L (ref 135–145)
Total Bilirubin: 0.3 mg/dL (ref 0.3–1.2)
Total Protein: 7.3 g/dL (ref 6.5–8.1)

## 2015-10-05 NOTE — Telephone Encounter (Signed)
-----   Message from [facility], PA-C sent at 10/05/2015  3:21 PM EDT ----- Is he on K+? If yes, stop.

## 2015-10-05 NOTE — Telephone Encounter (Signed)
Pt aware to stop taking the potassium if he is taking the potassium. Pt verbalized understanding.

## 2015-10-05 NOTE — Assessment & Plan Note (Addendum)
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 [name] followed by 6 months worth of adjuvant chemotherapy consisting of FOLFOX with a change in treatment to infusional 5-FU for progressive peripheral neuropathy to finish 6 months worth of treatment by Dr. [name].  Oncology history is updated.  Staging in [facility] problem list is updated.  Labs today: CBC diff, CMET, CEA.  I personally reviewed and went over laboratory results with the patient.  The results are noted within this dictation.  I personally reviewed and went over pathology results with the patient.  He is scheduled to undergo SBRT beginning on 9/5 x 5 fractions of a biopsy proven oligometastasis of RLL.   Last Colonoscopy in January 2016 with Dr. Anthony Sar in [place].    Return in 3-4 weeks for follow-up.

## 2015-10-05 NOTE — Patient Instructions (Signed)
[place] at [facility] Discharge Instructions  RECOMMENDATIONS MADE BY THE CONSULTANT AND ANY TEST RESULTS WILL BE SENT TO YOUR REFERRING PHYSICIAN.  You were seen today by Kirby Crigler PA-C. You should have labs drawn today. Radiation in [place] as scheduled.  Return in 4 weeks to see Dr. Whitney Muse.   Thank you for choosing [place] at [facility] to provide your oncology and hematology care.  To afford each patient quality time with our provider, please arrive at least 15 minutes before your scheduled appointment time.   Beginning January 23rd 2017 lab work for the [facility] will be done in the  Main lab at [facility] on 1st floor. If you have a lab appointment with the [facility] please come in thru the  Main Entrance and check in at the main information desk  You need to re-schedule your appointment should you arrive 10 or more minutes late.  We strive to give you quality time with our providers, and arriving late affects you and other patients whose appointments are after yours.  Also, if you no show three or more times for appointments you may be dismissed from the clinic at the providers discretion.     Again, thank you for choosing [facility].  Our hope is that these requests will decrease the amount of time that you wait before being seen by our physicians.       _____________________________________________________________  Should you have questions after your visit to [facility], please contact our office at [phone] between the hours of 8:30 a.m. and 4:30 p.m.  Voicemails left after 4:30 p.m. will not be returned until the following business day.  For prescription refill requests, have your pharmacy contact our office.         Resources For Cancer Patients and their Caregivers ? American Cancer Society: Can assist with transportation, wigs, general needs, runs Look Good Feel Better.         669-499-3961 ? Cancer Care: Provides financial assistance, online support groups, medication/co-pay assistance.  1-800-813-HOPE 424-452-3856) ? [place] Assists [place] cancer patients and their families through emotional , educational and financial support.  (920)055-6356 ? [place] DSS Where to apply for food stamps, Medicaid and utility assistance. 703 454 7118 ? [facility]: Transportation to medical appointments. (463) 729-2214 ? Social Security Administration: May apply for disability if have a Stage IV cancer. 509-734-4600 254-260-5394 ? [employer], Disability and Transit Services: Assists with nutrition, care and transit needs. [place] Support Programs: '@10RELATIVEDAYS'$ @ > Cancer Support Group  2nd Tuesday of the month 1pm-2pm, Journey Room  > Creative Journey  3rd Tuesday of the month 1130am-1pm, Journey Room  > Look Good Feel Better  1st Wednesday of the month 10am-12 noon, Journey Room (Call [place] to register (518)[phone])

## 2015-10-05 NOTE — Progress Notes (Signed)
[name], MD [place] [address]  Adenocarcinoma of colon [facility]) - Plan: CBC with Differential, Comprehensive metabolic panel, CEA  CURRENT THERAPY: Planned for SBRT with Dr. [name]  INTERVAL HISTORY: Gary Nelson 75 y.o. 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 [name] followed by 6 months worth of adjuvant chemotherapy consisting of FOLFOX with a change in treatment to infusional 5-FU for progressive peripheral neuropathy to finish 6 months worth of treatment by Dr. [name].    Adenocarcinoma of colon ([place])   10/04/2012 Definitive Surgery    Partial collectomy by Dr. [name]      10/04/2012 Pathology Results    4.5 cm poorly differentiated adenocarcinoma, negative margins, 0/7 lymph nodes for metastatic disease.  Oncotype recurrence score of 26 (high)       Chemotherapy    FOLFOX       Adverse Reaction    Progressive peripheral neuropathy       Chemotherapy    Infusional 5 FU.  Completed 6 months worth of adjuvant therapy.      02/23/2014 Procedure    Colonoscopy by Dr. Anthony Sar.      07/01/2015 PET scan    There is malignant range uptake with RLL pulmonary nodule suspicious for metastatic disease or primary pulm neoplasm. Nonspecific focus of uptake in the L femoral neck. No corresponding CT abnormality. Cannot rule out metastasis.      07/06/2015 Imaging    MR C-spine- C5-6 there is a broad-based disc bulge w R paracentral disc protrusion deforming the spinal cord. Moderate R foraminal stenosis. C6-7 there is a central disc protrusion slightly eccentric towards the R and mildly deforming the ventral c-spine       09/16/2015 Procedure    RLL needle biopsy by IR.      09/17/2015  Pathology Results    Metastatic adenocarcinoma consistent with a primary colorectal adenocarcinoma.       Radiation Therapy    Planned to start SBRT with Dr. [name] on 10/12/2015.      He denies any complaints today.    Review of Systems  Constitutional: Negative.  Negative for chills, fever and weight loss.  HENT: Negative.   Eyes: Negative.  Negative for blurred vision and double vision.  Respiratory: Negative.  Negative for cough.   Cardiovascular: Negative.  Negative for chest pain.  Gastrointestinal: Negative.  Negative for abdominal pain, blood in stool, constipation, diarrhea, melena, nausea and vomiting.  Genitourinary: Negative.   Musculoskeletal: Negative.   Skin: Negative.   Neurological: Negative.  Negative for weakness.  Endo/Heme/Allergies: Negative.   Psychiatric/Behavioral: Negative.     Past Medical History:  Diagnosis Date  . Adenocarcinoma of colon ([place])    RLL -  Pulmonary nodule (06/2015, concerning for mets vs primary; to see RAD-ONC Dr. [name]); stage 2 adenocarinoma   2014 - Colon Cancer - surgery and chemo  . Blood infection ([place])   . Cystitis   . Diabetes mellitus without complication ([place])    Type II  . DVT (deep venous thrombosis) ([place]) 2014   post op  . Hematuria   . Hypertension   . Neuropathy ([place])   . Prostatitis   . Pulmonary nodule   . Sleep apnea  Past Surgical History:  Procedure Laterality Date  . BACK SURGERY    . CATARACT EXTRACTION     left  . CATARACT EXTRACTION W/PHACO Right 04/11/2012   Procedure: CATARACT EXTRACTION PHACO AND INTRAOCULAR LENS PLACEMENT (IOC);  Surgeon: Tonny Branch, MD;  Location: [facility] ORS;  Service: Ophthalmology;  Laterality: Right;  CDE:62.48  . CERVICAL FUSION     c4-c5  . CHOLECYSTECTOMY    . COLON SURGERY  09/29/12   Colon resection for cancer  . COLONOSCOPY    . EYE SURGERY     cataract  . GIVENS CAPSULE STUDY N/A 03/15/2015   Procedure: GIVENS CAPSULE STUDY;  Surgeon: Rogene Houston, MD;  Location:  [facility] END[facility] SUITE;  Service: Endoscopy;  Laterality: N/A;  730  . LEG SURGERY    . ORIF FEMUR FRACTURE     right  . POSTERIOR CERVICAL FUSION/FORAMINOTOMY N/A 07/27/2015   Procedure: Cervical four to Cervical seven Laminectomy with Cervical four to Thoracic one Dorsal internal fixation and fusion;  Surgeon: Kevan Ny Ditty, MD;  Location: [facility] OR[facility];  Service: Neurosurgery;  Laterality: N/A;  C4 to C7 Laminectomy with C4 to T1 Dorsal internal fixation and fusion    Family History  Problem Relation Age of Onset  . Heart disease Father   . Heart attack Father   . Alzheimer's disease Mother     Social History   Social History  . Marital status: Single    Spouse name: N/A  . Number of children: N/A  . Years of education: N/A   Social History Main Topics  . Smoking status: Former Smoker    Packs/day: 1.00    Years: 55.00    Types: Cigarettes    Start date: 02/07/1955  . Smokeless tobacco: Never Used     Comment: quit 2015  . Alcohol use 1.8 oz/week    3 Cans of beer per week  . Drug use: No  . Sexual activity: Yes    Birth control/ protection: None   Other Topics Concern  . None   Social History Narrative  . None     PHYSICAL EXAMINATION  ECOG PERFORMANCE STATUS: 2 - Symptomatic, <50% confined to bed  Vitals:   10/05/15 1021  BP: (!) 98/39  Pulse: (!) 108  Resp: 20  Temp: 97.9 F (36.6 C)    GENERAL:alert, no distress, well nourished, well developed, comfortable, cooperative, obese, smiling and accompanied by brother SKIN: skin color, texture, turgor are normal, no rashes or significant lesions HEAD: Normocephalic, No masses, lesions, tenderness or abnormalities EYES: normal, EOMI, Conjunctiva are pink and non-injected EARS: External ears normal OROPHARYNX:lips, buccal mucosa, and tongue normal and mucous membranes are moist  NECK: supple, trachea midline LYMPH:  no palpable lymphadenopathy BREAST:not examined LUNGS: clear to auscultation  HEART: regular  rate & rhythm ABDOMEN:abdomen soft, obese and normal bowel sounds BACK: Back symmetric, no curvature. EXTREMITIES:less then 2 second capillary refill, no joint deformities, effusion, or inflammation, no skin discoloration, no cyanosis  NEURO: alert & oriented x 3 with fluent speech, no focal motor/sensory deficits.   LABORATORY DATA: CBC    Component Value Date/Time   WBC 9.7 10/05/2015 1107   RBC 4.27 10/05/2015 1107   HGB 12.5 (L) 10/05/2015 1107   HCT 39.8 10/05/2015 1107   PLT 327 10/05/2015 1107   MCV 93.2 10/05/2015 1107   MCH 29.3 10/05/2015 1107   MCHC 31.4 10/05/2015 1107   RDW 14.4 10/05/2015 1107   LYMPHSABS 1.4 10/05/2015 1107  MONOABS 1.0 10/05/2015 1107   EOSABS 0.2 10/05/2015 1107   BASOSABS 0.1 10/05/2015 1107      Chemistry      Component Value Date/Time   NA 135 10/05/2015 1107   K 5.2 (H) 10/05/2015 1107   CL 103 10/05/2015 1107   CO2 23 10/05/2015 1107   BUN 29 (H) 10/05/2015 1107   CREATININE 1.25 (H) 10/05/2015 1107      Component Value Date/Time   CALCIUM 8.7 (L) 10/05/2015 1107   ALKPHOS 73 10/05/2015 1107   AST 21 10/05/2015 1107   ALT 17 10/05/2015 1107   BILITOT 0.3 10/05/2015 1107     Lab Results  Component Value Date   CEA 3.6 07/15/2015     PENDING LABS:   RADIOGRAPHIC STUDIES:  Dg Chest 1 View  Result Date: 09/16/2015 CLINICAL DATA:  Right lung mass.  Status post biopsy. EXAM: CHEST 1 VIEW COMPARISON:  Chest x-ray dated 02/10/2013 FINDINGS: No pneumothorax or other significant abnormality after lung biopsy. 14 mm nodule seen at the right lung base posterior medially. The lungs are otherwise clear. Heart size and vascularity are normal. Port-A-Cath in place. Previous cervical fusion. Old deformity of the left scapula. IMPRESSION: No pneumothorax or other complication after needle biopsy of right lung mass. The mass is visible at the right lung base medially. Electronically Signed   By: Lorriane Shire M.D.   On: 09/16/2015 14:37     Ct Biopsy  Result Date: 09/16/2015 INDICATION: 75 year old with a suspicious right lower lobe nodule. History of colon cancer. Evaluate for a primary lung neoplasm versus metastasis. EXAM: CT-GUIDED BIOPSY OF RIGHT LOWER LOBE NODULE MEDICATIONS: None. ANESTHESIA/SEDATION: Moderate (conscious) sedation was employed during this procedure. A total of Versed 1.0 mg and Fentanyl 50 mcg was administered intravenously. Moderate Sedation Time: 20 minutes. The patient's level of consciousness and vital signs were monitored continuously by radiology nursing throughout the procedure under my direct supervision. FLUOROSCOPY TIME:  None COMPLICATIONS: None immediate. PROCEDURE: Informed written consent was obtained from the patient after a thorough discussion of the procedural risks, benefits and alternatives. All questions were addressed. Maximal Sterile Barrier Technique was utilized including caps, mask, sterile gowns, sterile gloves, sterile drape, hand hygiene and skin antiseptic. A timeout was performed prior to the initiation of the procedure. Patient was placed on his right side. CT images through the lung bases were obtained. The nodule along the medial right lower lobe was identified. The right side of the back was prepped with chlorhexidine and a sterile field was created. Skin and soft tissues were anesthetized with 1% lidocaine. 17 gauge needle directed into the nodule using CT guidance. Needle position was confirmed within the lesion. Three core biopsies obtained with an 18 gauge core device. Specimens placed in formalin. Needle removed without complication. Bandage placed at the puncture site. FINDINGS: Pleural-based nodule in the medial right lower lobe. Nodule measures up to 2.3 cm. Needle placement confirmed within the nodule. Small amount of parenchymal hemorrhage around the nodule following the core biopsy. Negative for pneumothorax. IMPRESSION: Successful CT-guided core biopsy of the right lower lobe  nodule. Electronically Signed   By: Markus Daft M.D.   On: 09/16/2015 19:27     PATHOLOGY: Diagnosis Lung, needle/core biopsy(ies), Right lower lobe - METASTATIC ADENOCARCINOMA, SEE COMMENT. Microscopic Comment The morphology is consistent with a primary colorectal adenocarcinoma. The case was called to Dr. [name] on 09/17/2015. 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 [name] followed by 6 months worth of adjuvant chemotherapy consisting of FOLFOX with a change in treatment to infusional 5-FU for progressive peripheral neuropathy to finish 6 months worth of treatment by Dr. [name].  Oncology history is updated.  Staging in [facility] problem list is updated.  Labs today: CBC diff, CMET, CEA.  I personally reviewed and went over laboratory results with the patient.  The results are noted within this dictation.  I personally reviewed and went over pathology results with the patient.  He is scheduled to undergo SBRT beginning on 9/5 x 5 fractions of a biopsy proven oligometastasis of RLL.   Last Colonoscopy in January 2016 with Dr. Anthony Sar in [place].    Return in 3-4 weeks for follow-up.   ORDERS PLACED FOR THIS ENCOUNTER: Orders Placed This Encounter  Procedures  . CBC with Differential  . Comprehensive metabolic panel  . CEA    MEDICATIONS PRESCRIBED THIS ENCOUNTER: No orders of the defined types were placed in this encounter.   THERAPY PLAN:  Complete SBRT as planned for oligometastatic disease to RLL of lung.  All questions were answered. The patient knows to call the clinic with any problems, questions or  concerns. We can certainly see the patient much sooner if necessary.  Patient and plan discussed with Dr. Ancil Linsey and she is in agreement with the aforementioned.   This note is electronically signed by: Robynn Pane, PA-C 10/05/2015 5:00 PM

## 2015-10-06 ENCOUNTER — Telehealth (HOSPITAL_COMMUNITY): Payer: Self-pay | Admitting: *Deleted

## 2015-10-06 LAB — CEA: CEA: 3.4 ng/mL (ref 0.0–4.7)

## 2015-10-06 NOTE — Telephone Encounter (Signed)
-----   Message from [facility], PA-C sent at 10/06/2015 12:44 PM EDT ----- WNL

## 2015-10-06 NOTE — Telephone Encounter (Signed)
Pt aware of lab results 

## 2015-10-08 DIAGNOSIS — M4712 Other spondylosis with myelopathy, cervical region: Secondary | ICD-10-CM | POA: Diagnosis not present

## 2015-10-12 ENCOUNTER — Ambulatory Visit
Admission: RE | Admit: 2015-10-12 | Discharge: 2015-10-12 | Disposition: A | Discharge status: Home / Self Care | Payer: Medicare Other | Source: Ambulatory Visit | Attending: Radiation Oncology | Admitting: Radiation Oncology

## 2015-10-12 DIAGNOSIS — Z794 Long term (current) use of insulin: Secondary | ICD-10-CM | POA: Diagnosis not present

## 2015-10-12 DIAGNOSIS — Z7982 Long term (current) use of aspirin: Secondary | ICD-10-CM | POA: Diagnosis not present

## 2015-10-12 DIAGNOSIS — C7901 Secondary malignant neoplasm of right kidney and renal pelvis: Secondary | ICD-10-CM | POA: Diagnosis not present

## 2015-10-12 DIAGNOSIS — Z79899 Other long term (current) drug therapy: Secondary | ICD-10-CM | POA: Diagnosis not present

## 2015-10-12 DIAGNOSIS — Z510 Encounter for antineoplastic radiation therapy: Secondary | ICD-10-CM | POA: Diagnosis not present

## 2015-10-13 ENCOUNTER — Ambulatory Visit: Payer: Medicare Other | Admitting: Radiation Oncology

## 2015-10-14 ENCOUNTER — Ambulatory Visit: Payer: Medicare Other | Admitting: Radiation Oncology

## 2015-10-15 ENCOUNTER — Ambulatory Visit
Admission: RE | Admit: 2015-10-15 | Discharge: 2015-10-15 | Disposition: A | Discharge status: Home / Self Care | Payer: Medicare Other | Source: Ambulatory Visit | Attending: Radiation Oncology | Admitting: Radiation Oncology

## 2015-10-15 DIAGNOSIS — Z79899 Other long term (current) drug therapy: Secondary | ICD-10-CM | POA: Diagnosis not present

## 2015-10-15 DIAGNOSIS — Z794 Long term (current) use of insulin: Secondary | ICD-10-CM | POA: Diagnosis not present

## 2015-10-15 DIAGNOSIS — Z7982 Long term (current) use of aspirin: Secondary | ICD-10-CM | POA: Diagnosis not present

## 2015-10-15 DIAGNOSIS — Z510 Encounter for antineoplastic radiation therapy: Secondary | ICD-10-CM | POA: Diagnosis not present

## 2015-10-15 DIAGNOSIS — C7901 Secondary malignant neoplasm of right kidney and renal pelvis: Secondary | ICD-10-CM | POA: Diagnosis not present

## 2015-10-18 ENCOUNTER — Ambulatory Visit
Admission: RE | Admit: 2015-10-18 | Discharge: 2015-10-18 | Disposition: A | Discharge status: Home / Self Care | Payer: Medicare Other | Source: Ambulatory Visit | Attending: Radiation Oncology | Admitting: Radiation Oncology

## 2015-10-18 DIAGNOSIS — Z794 Long term (current) use of insulin: Secondary | ICD-10-CM | POA: Diagnosis not present

## 2015-10-18 DIAGNOSIS — Z7982 Long term (current) use of aspirin: Secondary | ICD-10-CM | POA: Diagnosis not present

## 2015-10-18 DIAGNOSIS — Z79899 Other long term (current) drug therapy: Secondary | ICD-10-CM | POA: Diagnosis not present

## 2015-10-18 DIAGNOSIS — Z510 Encounter for antineoplastic radiation therapy: Secondary | ICD-10-CM | POA: Diagnosis not present

## 2015-10-18 DIAGNOSIS — C7901 Secondary malignant neoplasm of right kidney and renal pelvis: Secondary | ICD-10-CM | POA: Diagnosis not present

## 2015-10-20 ENCOUNTER — Encounter: Payer: Self-pay | Admitting: Radiation Oncology

## 2015-10-20 ENCOUNTER — Ambulatory Visit
Admission: RE | Admit: 2015-10-20 | Discharge: 2015-10-20 | Disposition: A | Discharge status: Home / Self Care | Payer: Medicare Other | Source: Ambulatory Visit | Attending: Radiation Oncology | Admitting: Radiation Oncology

## 2015-10-20 DIAGNOSIS — C7800 Secondary malignant neoplasm of unspecified lung: Secondary | ICD-10-CM | POA: Insufficient documentation

## 2015-10-20 DIAGNOSIS — Z79899 Other long term (current) drug therapy: Secondary | ICD-10-CM | POA: Diagnosis not present

## 2015-10-20 DIAGNOSIS — C7801 Secondary malignant neoplasm of right lung: Secondary | ICD-10-CM

## 2015-10-20 DIAGNOSIS — C7901 Secondary malignant neoplasm of right kidney and renal pelvis: Secondary | ICD-10-CM | POA: Diagnosis not present

## 2015-10-20 DIAGNOSIS — Z7982 Long term (current) use of aspirin: Secondary | ICD-10-CM | POA: Diagnosis not present

## 2015-10-20 DIAGNOSIS — Z510 Encounter for antineoplastic radiation therapy: Secondary | ICD-10-CM | POA: Diagnosis not present

## 2015-10-20 DIAGNOSIS — Z794 Long term (current) use of insulin: Secondary | ICD-10-CM | POA: Diagnosis not present

## 2015-10-20 NOTE — Progress Notes (Signed)
Department of Radiation Oncology  Phone:  [phone] Fax:        [phone]  Weekly Treatment Note    Name: Gary Nelson Date: 10/20/2015 MRN: [id number] DOB: [birth year]   Diagnosis:     ICD-9-CM ICD-10-CM   1. 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 aspirin EC 81 MG tablet Take 81 mg by mouth daily.    . clotrimazole (LOTRIMIN) 1 % cream Apply 1 application topically daily as needed (rash).     Marland Kitchen docusate sodium (COLACE) 100 MG capsule Take 1 capsule (100 mg total) by mouth 2 (two) times daily. 10 capsule 0  . furosemide (LASIX) 40 MG tablet Take 20 mg by mouth daily.    Marland Kitchen gabapentin (NEURONTIN) 300 MG capsule Take 2 capsules (600 mg total) by mouth 3 (three) times daily. 180 capsule 2  . insulin NPH (HUMULIN N,NOVOLIN N) 100 UNIT/ML injection Inject 15-30 Units into the skin See admin instructions. 30 units every morning and 15 units in the evening    . lisinopril (PRINIVIL,ZESTRIL) 40 MG tablet Take 40 mg by mouth daily.    . potassium chloride SA (K-DUR,KLOR-CON) 20 MEQ tablet Take 10 mEq by mouth daily.    . tamsulosin (FLOMAX) 0.4 MG CAPS capsule Take 1 capsule (0.4 mg total) by mouth daily. 30 capsule 2  . HYDROcodone-acetaminophen (NORCO) 7.5-325 MG tablet Take 1-2 tablets by mouth every 6 (six) hours as needed for moderate pain. (Patient not taking: Reported on 10/20/2015) 90 tablet 0  . magnesium hydroxide (MILK OF MAGNESIA) 400 MG/5ML suspension Take 30 mLs by mouth daily as needed for mild constipation.    . methocarbamol (ROBAXIN) 750 MG tablet Take 1 tablet (750 mg total) by mouth every 6 (six) hours as needed for muscle spasms. (Patient not taking: Reported on 10/20/2015) 120 tablet 2  . OXYGEN Inhale 2 L into the lungs continuous.     No current facility-administered medications for this encounter.       ALLERGIES: Review of patient's allergies indicates no known allergies.   LABORATORY DATA:  Lab Results  Component Value Date   WBC 9.7 10/05/2015   HGB 12.5 (L) 10/05/2015   HCT 39.8 10/05/2015   MCV 93.2 10/05/2015   PLT 327 10/05/2015   Lab Results  Component Value Date   NA 135 10/05/2015   K 5.2 (H) 10/05/2015   CL 103 10/05/2015   CO2 23 10/05/2015   Lab Results  Component Value Date   ALT 17 10/05/2015   AST 21 10/05/2015   ALKPHOS 73 10/05/2015   BILITOT 0.3 10/05/2015     NARRATIVE: Gary Nelson was seen today for weekly treatment management. The chart was checked and the patient's films were reviewed.  Mr. Gary Nelson has received 4 fractions to his right lung.  Skin to right chest with normal color.  Appetite is good.  Denies fatigue,no swallowing problems.  SOB,coughing at times clear.  One month follow up card given to see Gary Nelson, Gary Nelson, [name] Wt Readings from Last 3 Encounters:  10/20/15 265 lb 6.4 oz (120.4 kg)  09/16/15 275 lb (124.7 kg)  07/27/15 (!) 300 lb 7.8 oz (136.3 kg)  BP 120/64 (BP Location: Left Arm, Patient Position: Sitting, Cuff Size: Normal)   Pulse (!) 104   Temp 98 F (36.7 C) (Oral)   Resp  18   Ht '5\' 5"'$  (1.651 m)   Wt 265 lb 6.4 oz (120.4 kg)   SpO2 96%   BMI 44.16 kg/m   PHYSICAL EXAMINATION: height is '5\' 5"'$  (1.651 m) and weight is 265 lb 6.4 oz (120.4 kg). His oral temperature is 98 F (36.7 C). His blood pressure is 120/64 and his pulse is 104 (abnormal). His respiration is 18 and oxygen saturation is 96%.        ASSESSMENT: The patient is doing satisfactorily with treatment.  PLAN: We will continue with the patient's radiation treatment as planned. The patient will follow-up in one month after he completes radiation treatment.

## 2015-10-20 NOTE — Progress Notes (Signed)
Mr. Gary Nelson has received 4 fractions to his right lung.  Skin to right chest with normal color.  Appetite is good.  Denies fatigue,no swallowing problems.  SOB,coughing at times clear.  One month follow up card given to see [name];ison, [name], [name] Wt Readings from Last 3 Encounters:  10/20/15 265 lb 6.4 oz (120.4 kg)  09/16/15 275 lb (124.7 kg)  07/27/15 (!) 300 lb 7.8 oz (136.3 kg)  BP 120/64 (BP Location: Left Arm, Patient Position: Sitting, Cuff Size: Normal)   Pulse (!) 104   Temp 98 F (36.7 C) (Oral)   Resp 18   Ht '5\' 5"'$  (1.651 m)   Wt 265 lb 6.4 oz (120.4 kg)   SpO2 96%   BMI 44.16 kg/m

## 2015-10-22 ENCOUNTER — Ambulatory Visit
Admission: RE | Admit: 2015-10-22 | Discharge: 2015-10-22 | Disposition: A | Discharge status: Home / Self Care | Payer: Medicare Other | Source: Ambulatory Visit | Attending: Radiation Oncology | Admitting: Radiation Oncology

## 2015-10-22 ENCOUNTER — Encounter: Payer: Self-pay | Admitting: Radiation Oncology

## 2015-10-22 DIAGNOSIS — C7901 Secondary malignant neoplasm of right kidney and renal pelvis: Secondary | ICD-10-CM | POA: Diagnosis not present

## 2015-10-22 DIAGNOSIS — Z79899 Other long term (current) drug therapy: Secondary | ICD-10-CM | POA: Diagnosis not present

## 2015-10-22 DIAGNOSIS — C3431 Malignant neoplasm of lower lobe, right bronchus or lung: Secondary | ICD-10-CM | POA: Diagnosis not present

## 2015-10-22 DIAGNOSIS — Z794 Long term (current) use of insulin: Secondary | ICD-10-CM | POA: Diagnosis not present

## 2015-10-22 DIAGNOSIS — Z7982 Long term (current) use of aspirin: Secondary | ICD-10-CM | POA: Diagnosis not present

## 2015-10-22 DIAGNOSIS — Z510 Encounter for antineoplastic radiation therapy: Secondary | ICD-10-CM | POA: Diagnosis not present

## 2015-11-01 DIAGNOSIS — G959 Disease of spinal cord, unspecified: Secondary | ICD-10-CM | POA: Diagnosis not present

## 2015-11-01 DIAGNOSIS — I872 Venous insufficiency (chronic) (peripheral): Secondary | ICD-10-CM | POA: Diagnosis not present

## 2015-11-01 DIAGNOSIS — M160 Bilateral primary osteoarthritis of hip: Secondary | ICD-10-CM | POA: Diagnosis not present

## 2015-11-01 DIAGNOSIS — C7800 Secondary malignant neoplasm of unspecified lung: Secondary | ICD-10-CM | POA: Diagnosis not present

## 2015-11-01 DIAGNOSIS — C189 Malignant neoplasm of colon, unspecified: Secondary | ICD-10-CM | POA: Diagnosis not present

## 2015-11-01 DIAGNOSIS — E119 Type 2 diabetes mellitus without complications: Secondary | ICD-10-CM | POA: Diagnosis not present

## 2015-11-01 DIAGNOSIS — I10 Essential (primary) hypertension: Secondary | ICD-10-CM | POA: Diagnosis not present

## 2015-11-08 ENCOUNTER — Encounter (HOSPITAL_COMMUNITY): Payer: Medicare Other | Attending: Oncology | Admitting: Oncology

## 2015-11-08 ENCOUNTER — Encounter (HOSPITAL_COMMUNITY): Payer: Self-pay | Admitting: Oncology

## 2015-11-08 ENCOUNTER — Encounter (HOSPITAL_COMMUNITY): Payer: Medicare Other

## 2015-11-08 VITALS — BP 129/55 | HR 109 | Temp 98.3°F | Resp 20 | Wt 269.6 lb

## 2015-11-08 DIAGNOSIS — Z23 Encounter for immunization: Secondary | ICD-10-CM | POA: Diagnosis not present

## 2015-11-08 DIAGNOSIS — C189 Malignant neoplasm of colon, unspecified: Secondary | ICD-10-CM | POA: Diagnosis not present

## 2015-11-08 DIAGNOSIS — C7801 Secondary malignant neoplasm of right lung: Secondary | ICD-10-CM

## 2015-11-08 DIAGNOSIS — G620 Drug-induced polyneuropathy: Secondary | ICD-10-CM

## 2015-11-08 DIAGNOSIS — Z0000 Encounter for general adult medical examination without abnormal findings: Secondary | ICD-10-CM

## 2015-11-08 DIAGNOSIS — R911 Solitary pulmonary nodule: Secondary | ICD-10-CM | POA: Insufficient documentation

## 2015-11-08 DIAGNOSIS — R938 Abnormal findings on diagnostic imaging of other specified body structures: Secondary | ICD-10-CM | POA: Insufficient documentation

## 2015-11-08 LAB — COMPREHENSIVE METABOLIC PANEL
ALT: 19 U/L (ref 17–63)
AST: 22 U/L (ref 15–41)
Albumin: 3.5 g/dL (ref 3.5–5.0)
Alkaline Phosphatase: 73 U/L (ref 38–126)
Anion gap: 5 (ref 5–15)
BUN: 28 mg/dL — ABNORMAL HIGH (ref 6–20)
CO2: 25 mmol/L (ref 22–32)
Calcium: 8.8 mg/dL — ABNORMAL LOW (ref 8.9–10.3)
Chloride: 103 mmol/L (ref 101–111)
Creatinine, Ser: 1.09 mg/dL (ref 0.61–1.24)
GFR calc Af Amer: >60 mL/min (ref >60)
GFR calc non Af Amer: >60 mL/min (ref >60)
Glucose, Bld: 106 mg/dL — ABNORMAL HIGH (ref 65–99)
Potassium: 4.5 mmol/L (ref 3.5–5.1)
Sodium: 133 mmol/L — ABNORMAL LOW (ref 135–145)
Total Bilirubin: 0.3 mg/dL (ref 0.3–1.2)
Total Protein: 7.2 g/dL (ref 6.5–8.1)

## 2015-11-08 LAB — CBC WITH DIFFERENTIAL/PLATELET
Basophils Absolute: 0.1 10*3/uL (ref 0.0–0.1)
Basophils Relative: 1 %
Eosinophils Absolute: 0.1 10*3/uL (ref 0.0–0.7)
Eosinophils Relative: 2 %
HCT: 39.8 % (ref 39.0–52.0)
Hemoglobin: 13.4 g/dL (ref 13.0–17.0)
Lymphocytes Relative: 13 %
Lymphs Abs: 1.1 10*3/uL (ref 0.7–4.0)
MCH: 30.7 pg (ref 26.0–34.0)
MCHC: 33.7 g/dL (ref 30.0–36.0)
MCV: 91.1 fL (ref 78.0–100.0)
Monocytes Absolute: 0.8 10*3/uL (ref 0.1–1.0)
Monocytes Relative: 9 %
Neutro Abs: 6.3 10*3/uL (ref 1.7–7.7)
Neutrophils Relative %: 75 %
Platelets: 262 10*3/uL (ref 150–400)
RBC: 4.37 MIL/uL (ref 4.22–5.81)
RDW: 14.6 % (ref 11.5–15.5)
WBC: 8.3 10*3/uL (ref 4.0–10.5)

## 2015-11-08 MED ORDER — INFLUENZA VAC SPLIT QUAD 0.5 ML IM SUSY
0.5000 mL | PREFILLED_SYRINGE | Freq: Once | INTRAMUSCULAR | Status: AC
  Administered 2015-11-08: 0.5 mL via INTRAMUSCULAR
  Filled 2015-11-08: qty 0.5

## 2015-11-08 NOTE — Progress Notes (Signed)
Gary Lair, MD [place] [address]  Preventative health care - Plan: Influenza vac split quadrivalent PF (FLUARIX) injection 0.5 mL  Adenocarcinoma of colon ([place]) - Plan: CBC with Differential, Comprehensive metabolic panel, CEA, CBC with Differential, Comprehensive metabolic panel, CEA  CURRENT THERAPY: Surveillance per NCCN guidelines.  INTERVAL HISTORY: Gary Nelson 75 y.o. 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 Truddie Nelson followed by 6 months worth of adjuvant chemotherapy consisting of FOLFOX with a change in treatment to infusional 5-FU for progressive peripheral neuropathy to finish 6 months worth of treatment by Dr. Jacquiline Nelson.  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    Adenocarcinoma of colon ([place])   10/04/2012 Definitive Surgery    Partial collectomy by Dr. Truddie Nelson      10/04/2012 Pathology Results    4.5 cm poorly differentiated adenocarcinoma, negative margins, 0/7 lymph nodes for metastatic disease.  Oncotype recurrence score of 26 (high)       Chemotherapy    FOLFOX       Adverse Reaction    Progressive peripheral neuropathy       Chemotherapy    Infusional 5 FU.  Completed 6 months worth of adjuvant therapy.      02/23/2014 Procedure    Colonoscopy by Dr. Anthony Nelson.      07/01/2015 PET scan    There is malignant range uptake with RLL pulmonary nodule suspicious for metastatic disease or primary pulm neoplasm. Nonspecific focus of uptake in the L femoral neck. No corresponding CT abnormality. Cannot rule out metastasis.      07/06/2015 Imaging    MR C-spine- C5-6 there is a broad-based disc bulge w R paracentral disc protrusion  deforming the spinal cord. Moderate R foraminal stenosis. C6-7 there is a central disc protrusion slightly eccentric towards the R and mildly deforming the ventral c-spine       09/16/2015 Procedure    RLL needle biopsy by IR.      09/17/2015 Pathology Results    Metastatic adenocarcinoma consistent with a primary colorectal adenocarcinoma.      10/12/2015 - 10/22/2015 Radiation Therapy    SBRT by Gary Nelson to RLL pulmonary lesion (biopsy proven to be oligometastasis).       He is doing well.  He denies any complaints today.  He was released from the [facility] today.  Review of Systems  Constitutional: Negative.  Negative for chills, fever and weight loss.  HENT: Negative.   Eyes: Negative.  Negative for blurred vision.  Respiratory: Negative.  Negative for cough.   Cardiovascular: Negative.  Negative for chest pain.  Gastrointestinal: Negative.  Negative for abdominal pain, nausea and vomiting.  Genitourinary: Negative.   Musculoskeletal: Negative.   Skin: Negative.   Neurological: Negative.  Negative for weakness.  Endo/Heme/Allergies: Negative.   Psychiatric/Behavioral: Negative.     Past Medical History:  Diagnosis Date  . Adenocarcinoma of colon ([place])    RLL -  Pulmonary nodule (06/2015, concerning for mets vs primary; to see RAD-ONC Gary Nelson); stage 2 adenocarinoma   2014 - Colon Cancer - surgery and chemo  . Blood infection ([place])   . Cystitis   . Diabetes mellitus without complication ([place])  Type II  . DVT (deep venous thrombosis) ([place]) 2014   post op  . Hematuria   . Hypertension   . Neuropathy ([place])   . Prostatitis   . Pulmonary nodule   . Sleep apnea     Past Surgical History:  Procedure Laterality Date  . BACK SURGERY    . CATARACT EXTRACTION     left  . CATARACT EXTRACTION W/PHACO Right 04/11/2012   Procedure: CATARACT EXTRACTION PHACO AND INTRAOCULAR LENS PLACEMENT (IOC);  Surgeon: Gary Branch, MD;  Location: [facility];  Service: Ophthalmology;   Laterality: Right;  CDE:62.48  . CERVICAL FUSION     c4-c5  . CHOLECYSTECTOMY    . COLON SURGERY  09/29/12   Colon resection for cancer  . COLONOSCOPY    . EYE SURGERY     cataract  . GIVENS CAPSULE STUDY N/A 03/15/2015   Procedure: GIVENS CAPSULE STUDY;  Surgeon: Gary Houston, MD;  Location: [facility];  Service: Endoscopy;  Laterality: N/A;  730  . LEG SURGERY    . ORIF FEMUR FRACTURE     right  . POSTERIOR CERVICAL FUSION/FORAMINOTOMY N/A 07/27/2015   Procedure: Cervical four to Cervical seven Laminectomy with Cervical four to Thoracic one Dorsal internal fixation and fusion;  Surgeon: Gary Ny Ditty, MD;  Location: [facility];  Service: Neurosurgery;  Laterality: N/A;  C4 to C7 Laminectomy with C4 to T1 Dorsal internal fixation and fusion    Family History  Problem Relation Age of Onset  . Heart disease Father   . Heart attack Father   . Alzheimer's disease Mother     Social History   Social History  . Marital status: Single    Spouse name: N/A  . Number of children: N/A  . Years of education: N/A   Social History Main Topics  . Smoking status: Former Smoker    Packs/day: 1.00    Years: 55.00    Types: Cigarettes    Start date: 02/07/1955  . Smokeless tobacco: Never Used     Comment: quit 2015  . Alcohol use 1.8 oz/week    3 Cans of beer per week  . Drug use: No  . Sexual activity: Yes    Birth control/ protection: None   Other Topics Concern  . None   Social History Narrative  . None     PHYSICAL EXAMINATION  ECOG PERFORMANCE STATUS: 2 - Symptomatic, <50% confined to bed  Vitals:   11/08/15 0935  BP: (!) 129/55  Pulse: (!) 109  Resp: 20  Temp: 98.3 F (36.8 C)    GENERAL:alert, well nourished, well developed, comfortable, cooperative, obese, smiling and in wheelchair, accompanied by brother. SKIN: skin color, texture, turgor are normal, no rashes or significant lesions HEAD: Normocephalic, No masses, lesions, tenderness or  abnormalities EYES: normal, EOMI, Conjunctiva are pink and non-injected EARS: External ears normal OROPHARYNX:lips, buccal mucosa, and tongue normal and mucous membranes are moist  NECK: supple, no adenopathy LYMPH:  no palpable lymphadenopathy BREAST:not examined LUNGS: clear to auscultation and percussion HEART: regular rate & rhythm, no murmurs, no gallops, S1 normal and S2 normal ABDOMEN:abdomen soft, non-tender, obese and normal bowel sounds BACK: No CVA tenderness EXTREMITIES:less then 2 second capillary refill, no joint deformities, effusion, or inflammation, no skin discoloration, no cyanosis  NEURO: alert & oriented x 3 with fluent speech, no focal motor/sensory deficits in wheelchair.   LABORATORY DATA: CBC    Component Value Date/Time   WBC 9.7 10/05/2015 1107  RBC 4.27 10/05/2015 1107   HGB 12.5 (L) 10/05/2015 1107   HCT 39.8 10/05/2015 1107   PLT 327 10/05/2015 1107   MCV 93.2 10/05/2015 1107   MCH 29.3 10/05/2015 1107   MCHC 31.4 10/05/2015 1107   RDW 14.4 10/05/2015 1107   LYMPHSABS 1.4 10/05/2015 1107   MONOABS 1.0 10/05/2015 1107   EOSABS 0.2 10/05/2015 1107   BASOSABS 0.1 10/05/2015 1107      Chemistry      Component Value Date/Time   NA 135 10/05/2015 1107   K 5.2 (H) 10/05/2015 1107   CL 103 10/05/2015 1107   CO2 23 10/05/2015 1107   BUN 29 (H) 10/05/2015 1107   CREATININE 1.25 (H) 10/05/2015 1107      Component Value Date/Time   CALCIUM 8.7 (L) 10/05/2015 1107   ALKPHOS 73 10/05/2015 1107   AST 21 10/05/2015 1107   ALT 17 10/05/2015 1107   BILITOT 0.3 10/05/2015 1107     Lab Results  Component Value Date   CEA 3.4 10/05/2015     PENDING LABS:   RADIOGRAPHIC STUDIES:  No results found.   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 Truddie Nelson followed by 6 months worth of adjuvant chemotherapy consisting of FOLFOX with a change in treatment to infusional 5-FU for progressive peripheral neuropathy to finish 6 months worth of treatment by Dr. Jacquiline Nelson.  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  Oncology history is updated.  Labs today: CBC diff, CMET, CEA.  I personally reviewed and went over laboratory results with the patient.  The results are noted within this dictation.  Labs in 3 months: CBC diff, CMET, CEA.  Influenza vaccine is given today.  Radiation Oncology follow-up is scheduled for 11/25/2015.  Will defer post-treatment imaging to radiation oncology.  Suspect imaging can be performed at [facility] to help ease the burden of transportation to [place].  Future follow-up in [place], [place] may be helpful moving forward if indicated.  Return in 3 months for follow-up.   ORDERS PLACED FOR THIS ENCOUNTER: Orders Placed This Encounter  Procedures  . CBC with Differential  . Comprehensive metabolic panel  . CEA  . CBC with Differential  . Comprehensive metabolic panel  . CEA    MEDICATIONS PRESCRIBED THIS ENCOUNTER: Meds ordered this encounter  Medications  . Influenza vac split quadrivalent PF (FLUARIX) injection 0.5 mL    THERAPY PLAN:  NCCN guidelines for surveillance for Colon cancer are as follows (1.2017):  A. Stage I   1. Colonoscopy at year 1    A. If advanced adenoma, repeat in 1 year    B. If no advanced adenoma, repeat in 3 years, and then every 5 years.  B. Stage II, Stage III   1. H+P every 3-6 months x 2 years and then every 6 months for a total of 5 years    2. CEA every 3-6 months x 2 years and then every 6 months for a total of 5 years    3. CT CAP every 6-12 months (category 2B for frequency < 12 months) for a total of 5 years  .   4.  Colonoscopy in 1 year except if no  preoperative colonoscopy due to obstructing lesion, colonoscopy in 3-6 months.     A. If advanced adenoma, repeat in 1 year    B. If no advanced adenoma, repeat in 3 years, then every 5 years   5. PET/CT scan is not recommended.  C. Stage IV   1. H+P every 3-6 months x 2 years and then every 6 months for a total of 5 years    2. CEA every 3 months x 2 years and then every 6 months for a total of 3- 5 years    3. CT CAP every 3-6 months (category 2B for frequency < 6 months) x 2 years., then every 6-12 months for a total of 5 years .   4. Colonoscopy in 1 year except if no preoperative colonoscopy due to obstructing lesion, colonoscopy in 3-6 months.     A. If advanced adenoma, repeat in 1 year    B. If no advanced adenoma, repeat in 3 years, then every 5 years   All questions were answered. The patient knows to call the clinic with any problems, questions or concerns. We can certainly see the patient much sooner if necessary.  Patient and plan discussed with Dr. Ancil Linsey and she is in agreement with the aforementioned.   This note is electronically signed by: Robynn Pane, PA-C 11/08/2015 10:01 AM

## 2015-11-08 NOTE — Patient Instructions (Addendum)
[place] at [facility] Discharge Instructions  RECOMMENDATIONS MADE BY THE CONSULTANT AND ANY TEST RESULTS WILL BE SENT TO YOUR REFERRING PHYSICIAN.  You saw Kirby Crigler, PA-C, today. You had flu shot today. Lab work today.  Follow up and labs in 3 months. Follow up with radiation as scheduled.  Thank you for choosing [facility] at [facility] to provide your oncology and hematology care.  To afford each patient quality time with our provider, please arrive at least 15 minutes before your scheduled appointment time.   Beginning January 23rd 2017 lab work for the [facility] will be done in the  Main lab at [facility] on 1st floor. If you have a lab appointment with the [facility] please come in thru the  Main Entrance and check in at the main information desk  You need to re-schedule your appointment should you arrive 10 or more minutes late.  We strive to give you quality time with our providers, and arriving late affects you and other patients whose appointments are after yours.  Also, if you no show three or more times for appointments you may be dismissed from the clinic at the providers discretion.     Again, thank you for choosing [facility]  Our hope is that these requests will decrease the amount of time that you wait before being seen by our physicians.       _____________________________________________________________  Should you have questions after your visit to [facility], please contact our office at [phone] between the hours of 8:30 a.m. and 4:30 p.m.  Voicemails left after 4:30 p.m. will not be returned until the following business day.  For prescription refill requests, have your pharmacy contact our office.         Resources For Cancer Patients and their Caregivers ? American Cancer Society: Can assist with transportation, wigs, general needs, runs Look Good Feel Better.         780-499-3577 ? Cancer Care: Provides financial assistance, online support groups, medication/co-pay assistance.  1-800-813-HOPE 774-526-5185) ? [place] Assists [place] cancer patients and their families through emotional , educational and financial support.  4251727658 ? [place] DSS Where to apply for food stamps, Medicaid and utility assistance. 206-378-2833 ? [facility]: Transportation to medical appointments. 860-653-8820 ? Social Security Administration: May apply for disability if have a Stage IV cancer. 872-653-0470 708-799-0170 ? [employer], Disability and Transit Services: Assists with nutrition, care and transit needs. [place] Support Programs: '@10RELATIVEDAYS'$ @ > Cancer Support Group  2nd Tuesday of the month 1pm-2pm, Journey Room  > Creative Journey  3rd Tuesday of the month 1130am-1pm, Journey Room  > Look Good Feel Better  1st Wednesday of the month 10am-12 noon, Journey Room (Call American Cancer Society to register [phone])   Influenza Virus Vaccine injection (Fluarix) What is this medicine? INFLUENZA VIRUS VACCINE (in floo EN zuh VAHY ruhs vak SEEN) helps to reduce the risk of getting influenza also known as the flu. This medicine may be used for other purposes; ask your health care provider or pharmacist if you have questions. What should I tell my health care provider before I take this medicine? They need to know if you have any of these conditions: -bleeding disorder like hemophilia -fever or infection -Guillain-Barre syndrome or other neurological problems -immune system problems -infection with the human immunodeficiency virus (HIV) or AIDS -low blood platelet  counts -multiple sclerosis -an unusual or allergic reaction to influenza virus vaccine, eggs, chicken proteins, latex, gentamicin, other medicines, foods, dyes or preservatives -pregnant or trying to get  pregnant -breast-feeding How should I use this medicine? This vaccine is for injection into a muscle. It is given by a health care professional. A copy of Vaccine Information Statements will be given before each vaccination. Read this sheet carefully each time. The sheet may change frequently. Talk to your pediatrician regarding the use of this medicine in children. Special care may be needed. Overdosage: If you think you have taken too much of this medicine contact a poison control center or emergency room at once. NOTE: This medicine is only for you. Do not share this medicine with others. What if I miss a dose? This does not apply. What may interact with this medicine? -chemotherapy or radiation therapy -medicines that lower your immune system like etanercept, anakinra, infliximab, and adalimumab -medicines that treat or prevent blood clots like warfarin -phenytoin -steroid medicines like prednisone or cortisone -theophylline -vaccines This list may not describe all possible interactions. Give your health care provider a list of all the medicines, herbs, non-prescription drugs, or dietary supplements you use. Also tell them if you smoke, drink alcohol, or use illegal drugs. Some items may interact with your medicine. What should I watch for while using this medicine? Report any side effects that do not go away within 3 days to your doctor or health care professional. Call your health care provider if any unusual symptoms occur within 6 weeks of receiving this vaccine. You may still catch the flu, but the illness is not usually as bad. You cannot get the flu from the vaccine. The vaccine will not protect against colds or other illnesses that may cause fever. The vaccine is needed every year. What side effects may I notice from receiving this medicine? Side effects that you should report to your doctor or health care professional as soon as possible: -allergic reactions like skin rash,  itching or hives, swelling of the face, lips, or tongue Side effects that usually do not require medical attention (report to your doctor or health care professional if they continue or are bothersome): -fever -headache -muscle aches and pains -pain, tenderness, redness, or swelling at site where injected -weak or tired This list may not describe all possible side effects. Call your doctor for medical advice about side effects. You may report side effects to FDA at 1-800-FDA-1088. Where should I keep my medicine? This vaccine is only given in a clinic, pharmacy, doctor's office, or other health care setting and will not be stored at home. NOTE: This sheet is a summary. It may not cover all possible information. If you have questions about this medicine, talk to your doctor, pharmacist, or health care provider.    2016, Elsevier/Gold Standard. (2007-08-21 09:30:40)

## 2015-11-08 NOTE — Progress Notes (Signed)
Gary Nelson presents today for injection per MD orders.Flu Vaccine administered IM in left Upper Arm. Administration without incident. Patient tolerated well.

## 2015-11-08 NOTE — Assessment & Plan Note (Addendum)
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 [name] followed by 6 months worth of adjuvant chemotherapy consisting of FOLFOX with a change in treatment to infusional 5-FU for progressive peripheral neuropathy to finish 6 months worth of treatment by Dr. [name].  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 [name].  Oncology history is updated.  Labs today: CBC diff, CMET, CEA.  I personally reviewed and went over laboratory results with the patient.  The results are noted within this dictation.  Labs in 3 months: CBC diff, CMET, CEA.  Influenza vaccine is given today.  Radiation Oncology follow-up is scheduled for 11/25/2015.  Will defer post-treatment imaging to radiation oncology.  Suspect imaging can be performed at [facility] to help ease the burden of transportation to [place].  Future follow-up in [place], [place] may be helpful moving forward if indicated.  Return in 3 months for follow-up.

## 2015-11-09 ENCOUNTER — Telehealth (HOSPITAL_COMMUNITY): Payer: Self-pay | Admitting: *Deleted

## 2015-11-09 DIAGNOSIS — M4712 Other spondylosis with myelopathy, cervical region: Secondary | ICD-10-CM | POA: Diagnosis not present

## 2015-11-09 DIAGNOSIS — I5030 Unspecified diastolic (congestive) heart failure: Secondary | ICD-10-CM | POA: Diagnosis not present

## 2015-11-09 DIAGNOSIS — E1121 Type 2 diabetes mellitus with diabetic nephropathy: Secondary | ICD-10-CM | POA: Diagnosis not present

## 2015-11-09 LAB — CEA: CEA: 2.9 ng/mL (ref 0.0–4.7)

## 2015-11-09 NOTE — Telephone Encounter (Signed)
Pt aware of normal results.

## 2015-11-09 NOTE — Telephone Encounter (Signed)
-----   Message from [facility], PA-C sent at 11/09/2015  4:26 PM EDT ----- WNL

## 2015-11-10 DIAGNOSIS — E1121 Type 2 diabetes mellitus with diabetic nephropathy: Secondary | ICD-10-CM | POA: Diagnosis not present

## 2015-11-10 DIAGNOSIS — I5030 Unspecified diastolic (congestive) heart failure: Secondary | ICD-10-CM | POA: Diagnosis not present

## 2015-11-10 DIAGNOSIS — M4712 Other spondylosis with myelopathy, cervical region: Secondary | ICD-10-CM | POA: Diagnosis not present

## 2015-11-12 DIAGNOSIS — I5030 Unspecified diastolic (congestive) heart failure: Secondary | ICD-10-CM | POA: Diagnosis not present

## 2015-11-12 DIAGNOSIS — E1121 Type 2 diabetes mellitus with diabetic nephropathy: Secondary | ICD-10-CM | POA: Diagnosis not present

## 2015-11-12 DIAGNOSIS — M4712 Other spondylosis with myelopathy, cervical region: Secondary | ICD-10-CM | POA: Diagnosis not present

## 2015-11-14 NOTE — Progress Notes (Signed)
[facility]iation On[facility] Simulation and Treatment Planning Note   Name:  '@PATNAME'$ @ MRN: [id number]   Date: 11/14/2015  DOB: 1940/07/27  Status:outpatient    DIAGNOSIS: '@CURRDX'$ @   CONSENT VERIFIED:yes   SET UP: Patient is setup supine   IMMOBILIZATION: The patient was immobilized using a Vac Loc bag and Abdominal Compression.   NARRATIVE:The patient was brought to the [facility].  Identity was confirmed.  All relevant records and images related to the planned course of therapy were reviewed.  Then, the patient was positioned in a stable reproducible clinical set-up for radiation therapy. Abdominal compression was applied by me.  4D CT images were obtained and reproducible breathing pattern was confirmed. Free breathing CT images were obtained.  Skin markings were placed.  The CT images were loaded into the planning software where the target and avoidance structures were contoured.  The radiation prescription was entered and confirmed.    TREATMENT PLANNING NOTE:  Treatment planning then occurred. I have requested : MLC's, isodose plan, basic dose calculation.  3 dimensional simulation is performed and dose volume histogram of the gross tumor volume, planning tumor volume and criticial normal structures including the spinal cord and lungs were analyzed and requested.  Special treatment procedure was performed due to high dose per fraction.  The patient will be monitored for increased risk of toxicity.  Daily imaging using cone beam CT will be used for target localization.  ------------------------------------------------  Jodelle Gross, MD, PhD

## 2015-11-14 NOTE — Progress Notes (Addendum)
  Radiation Oncology         [phone] ________________________________  Name: Gary Nelson MRN: [id number]  Date: 10/22/2015  DOB: [birth year]  End of Treatment Note  Diagnosis:   stage IV colon cancer with pulmonary metastasis     Indication for treatment:  Curative       Radiation treatment dates:   10/12/2015 through 10/22/2015  Site/dose:   The tumor in the right lung was treated with a course of stereotactic body radiation treatment. The patient received 50 Gy In 5 fractions at 10 G per fraction.  Narrative: The patient tolerated radiation treatment relatively well.   The patient did not have any signs of acute toxicity during treatment.  Plan: The patient has completed radiation treatment. The patient will return to radiation oncology clinic for routine followup in one month. I advised the patient to call or return sooner if they have any questions or concerns related to their recovery or treatment.   ------------------------------------------------  Jodelle Gross, MD, PhD

## 2015-11-15 DIAGNOSIS — M4712 Other spondylosis with myelopathy, cervical region: Secondary | ICD-10-CM | POA: Diagnosis not present

## 2015-11-15 DIAGNOSIS — E1121 Type 2 diabetes mellitus with diabetic nephropathy: Secondary | ICD-10-CM | POA: Diagnosis not present

## 2015-11-15 DIAGNOSIS — I5030 Unspecified diastolic (congestive) heart failure: Secondary | ICD-10-CM | POA: Diagnosis not present

## 2015-11-17 DIAGNOSIS — E1121 Type 2 diabetes mellitus with diabetic nephropathy: Secondary | ICD-10-CM | POA: Diagnosis not present

## 2015-11-17 DIAGNOSIS — I5030 Unspecified diastolic (congestive) heart failure: Secondary | ICD-10-CM | POA: Diagnosis not present

## 2015-11-17 DIAGNOSIS — M4712 Other spondylosis with myelopathy, cervical region: Secondary | ICD-10-CM | POA: Diagnosis not present

## 2015-11-19 DIAGNOSIS — E1121 Type 2 diabetes mellitus with diabetic nephropathy: Secondary | ICD-10-CM | POA: Diagnosis not present

## 2015-11-19 DIAGNOSIS — I5030 Unspecified diastolic (congestive) heart failure: Secondary | ICD-10-CM | POA: Diagnosis not present

## 2015-11-19 DIAGNOSIS — M4712 Other spondylosis with myelopathy, cervical region: Secondary | ICD-10-CM | POA: Diagnosis not present

## 2015-11-22 DIAGNOSIS — E1121 Type 2 diabetes mellitus with diabetic nephropathy: Secondary | ICD-10-CM | POA: Diagnosis not present

## 2015-11-22 DIAGNOSIS — I5030 Unspecified diastolic (congestive) heart failure: Secondary | ICD-10-CM | POA: Diagnosis not present

## 2015-11-22 DIAGNOSIS — M4712 Other spondylosis with myelopathy, cervical region: Secondary | ICD-10-CM | POA: Diagnosis not present

## 2015-11-23 DIAGNOSIS — M4712 Other spondylosis with myelopathy, cervical region: Secondary | ICD-10-CM | POA: Diagnosis not present

## 2015-11-23 DIAGNOSIS — E1121 Type 2 diabetes mellitus with diabetic nephropathy: Secondary | ICD-10-CM | POA: Diagnosis not present

## 2015-11-23 DIAGNOSIS — I5030 Unspecified diastolic (congestive) heart failure: Secondary | ICD-10-CM | POA: Diagnosis not present

## 2015-11-24 DIAGNOSIS — I5030 Unspecified diastolic (congestive) heart failure: Secondary | ICD-10-CM | POA: Diagnosis not present

## 2015-11-24 DIAGNOSIS — E1121 Type 2 diabetes mellitus with diabetic nephropathy: Secondary | ICD-10-CM | POA: Diagnosis not present

## 2015-11-24 DIAGNOSIS — M4712 Other spondylosis with myelopathy, cervical region: Secondary | ICD-10-CM | POA: Diagnosis not present

## 2015-11-25 ENCOUNTER — Encounter: Payer: Self-pay | Admitting: Radiation Oncology

## 2015-11-25 ENCOUNTER — Ambulatory Visit
Admission: RE | Admit: 2015-11-25 | Discharge: 2015-11-25 | Disposition: A | Discharge status: Home / Self Care | Payer: Medicare Other | Source: Ambulatory Visit | Attending: Radiation Oncology | Admitting: Radiation Oncology

## 2015-11-25 VITALS — BP 152/56 | HR 102 | Temp 97.6°F | Resp 18 | Ht 65.0 in | Wt 267.8 lb

## 2015-11-25 DIAGNOSIS — Z79899 Other long term (current) drug therapy: Secondary | ICD-10-CM | POA: Insufficient documentation

## 2015-11-25 DIAGNOSIS — G4733 Obstructive sleep apnea (adult) (pediatric): Secondary | ICD-10-CM | POA: Diagnosis not present

## 2015-11-25 DIAGNOSIS — C7801 Secondary malignant neoplasm of right lung: Secondary | ICD-10-CM | POA: Diagnosis not present

## 2015-11-25 DIAGNOSIS — C7800 Secondary malignant neoplasm of unspecified lung: Secondary | ICD-10-CM

## 2015-11-25 DIAGNOSIS — C189 Malignant neoplasm of colon, unspecified: Secondary | ICD-10-CM

## 2015-11-25 MED ORDER — SODIUM CHLORIDE 0.9% FLUSH
10.0000 mL | Freq: Once | INTRAVENOUS | Status: AC
  Administered 2015-11-25: 10 mL via INTRAVENOUS

## 2015-11-25 MED ORDER — HEPARIN SOD (PORK) LOCK FLUSH 100 UNIT/ML IV SOLN
500.0000 [IU] | Freq: Once | INTRAVENOUS | Status: AC
  Administered 2015-11-25: 500 [IU] via INTRAVENOUS

## 2015-11-25 NOTE — Progress Notes (Signed)
Verbal order from Shona Simpson to flush patient right power port Using sterile technique access patient right power port with 22g 1 inch huber needle x 1 attempt, np blood return, patient stated they haven't gotten blood from my port in  A year' Right power port  Flushed with 43m normal saline then 500units/569mheparin, de accessed port  A cath ,huber needle intact, slight blood on skin ,placed band aid over site right chest,patient tolerated well,  patient d/c via w/c and family male friend 11:08 AM

## 2015-11-25 NOTE — Progress Notes (Signed)
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 [name] after undergoing definitive surgery by Dr. [name] on 10/04/2012 with subsequent recurrence of disease in a pulmonary nodule. He had a biopsy scheduled however did not pursue this, and a PET scan on 07/01/15 revealed a right lower lobe nodule and nonspecific focus of hypermetabolism in the left femoral neck. He went on to receive SBRT with Dr. [name] and comes today for follow up since completing this course.  Interval Since Last Radiation:  4 weeks   10/12/2015 through 10/22/2015: The tumor in the right lung was treated with a course of stereotactic body radiation treatment. The patient received 50 Gy In 5 fractions at 10 G per fraction.  Narrative:  The patient returns today for routine follow-up. The patient reports that overall he is doing pretty well. He states that when he tilts his head forward, he has a difficult time with breathing or eating. Otherwise he is doing very well and denies any fevers chills, productive cough, shortness of breath or chest pain. No other complaints or verbalized.  ALLERGIES:  has No Known Allergies.to go back  Meds: Current Outpatient Prescriptions  Medication Sig Dispense Refill  . aspirin EC 81 MG tablet Take 81 mg by mouth daily.    . clotrimazole (LOTRIMIN) 1 % cream Apply 1 application topically daily as needed (rash).     . furosemide (LASIX) 40 MG tablet Take 20 mg by mouth daily.    Marland Kitchen gabapentin (NEURONTIN) 300 MG capsule Take 2 capsules (600 mg total) by mouth 3 (three) times daily. 180 capsule 2  . insulin NPH (HUMULIN N,NOVOLIN N) 100 UNIT/ML injection Inject 15-30 Units into the skin See admin  instructions. 30 units every morning and 15 units in the evening    . lisinopril (PRINIVIL,ZESTRIL) 40 MG tablet Take 40 mg by mouth daily.    . OXYGEN Inhale 2 L into the lungs continuous.    . potassium chloride SA (K-DUR,KLOR-CON) 20 MEQ tablet Take 10 mEq by mouth daily.     No current facility-administered medications for this encounter.     Physical Findings:  height is '5\' 5"'$  (1.651 m) and weight is 267 lb 12.8 oz (121.5 kg). His temperature is 97.6 F (36.4 C). His blood pressure is 152/56 (abnormal) and his pulse is 102 (abnormal). His respiration is 18 and oxygen saturation is 97%.  In general this is a well appearing Caucasian male in no acute distress. He's alert and oriented x4 and appropriate throughout the examination. Cardiopulmonary assessment is negative for acute distress and he exhibits normal effort.   Lab Findings: Lab Results  Component Value Date   WBC 8.3 11/08/2015   HGB 13.4 11/08/2015   HCT 39.8 11/08/2015   MCV 91.1 11/08/2015   PLT 262 11/08/2015     Radiographic Findings: No results found.  Impression/Plan: 1. 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 The patient did well and tolerating stereotactic body radiation therapy. We discussed the rationale for moving forward with repeat imaging and I will schedule a CT scan at [facility] in about 2 weeks with blood work to  confirm he can receive IV contrast. We will review these results by phone, and in 6 months repeat imaging at [facility] with follow up in the [place] office with Dr. [name]. He states agreement and understanding. 2. Central IV access. The patient's PAC was flushed today. He will continue to have this performed q 6-8 months.     Carola Rhine, PAC

## 2015-11-25 NOTE — Progress Notes (Signed)
Gary Nelson here for reassessment S/P SBRT to his Right Lung. He denies any pain today.  He using O2 on 3 liters/min at bedtime and has sleep apnea.  Note injury to his right hand, bruising with skin tear and forearm from recent fall.    BP (!) 152/56 (BP Location: Left Arm, Patient Position: Sitting, Cuff Size: Large)   Pulse (!) 102   Temp 97.6 F (36.4 C)   Resp 18   Ht '5\' 5"'$  (1.651 m)   Wt 267 lb 12.8 oz (121.5 kg)   SpO2 97%   BMI 44.56 kg/m    Wt Readings from Last 3 Encounters:  11/25/15 267 lb 12.8 oz (121.5 kg)  11/08/15 269 lb 9.6 oz (122.3 kg)  10/20/15 265 lb 6.4 oz (120.4 kg)

## 2015-11-29 ENCOUNTER — Telehealth: Payer: Self-pay | Admitting: *Deleted

## 2015-11-29 NOTE — Telephone Encounter (Signed)
Called patient to inform of CT scheduled for 12-09-15- arrival time - 12:45 pm @ [facility]  , patient to be NPO- 4 hrs. Prior to test , spoke with patient and he is aware of this test and he knows that Shona Simpson will give him the results over the phone

## 2015-11-30 DIAGNOSIS — I5030 Unspecified diastolic (congestive) heart failure: Secondary | ICD-10-CM | POA: Diagnosis not present

## 2015-11-30 DIAGNOSIS — M4712 Other spondylosis with myelopathy, cervical region: Secondary | ICD-10-CM | POA: Diagnosis not present

## 2015-11-30 DIAGNOSIS — E1121 Type 2 diabetes mellitus with diabetic nephropathy: Secondary | ICD-10-CM | POA: Diagnosis not present

## 2015-12-02 DIAGNOSIS — M4712 Other spondylosis with myelopathy, cervical region: Secondary | ICD-10-CM | POA: Diagnosis not present

## 2015-12-02 DIAGNOSIS — E1121 Type 2 diabetes mellitus with diabetic nephropathy: Secondary | ICD-10-CM | POA: Diagnosis not present

## 2015-12-02 DIAGNOSIS — I5030 Unspecified diastolic (congestive) heart failure: Secondary | ICD-10-CM | POA: Diagnosis not present

## 2015-12-04 DIAGNOSIS — M4712 Other spondylosis with myelopathy, cervical region: Secondary | ICD-10-CM | POA: Diagnosis not present

## 2015-12-04 DIAGNOSIS — I5030 Unspecified diastolic (congestive) heart failure: Secondary | ICD-10-CM | POA: Diagnosis not present

## 2015-12-04 DIAGNOSIS — E1121 Type 2 diabetes mellitus with diabetic nephropathy: Secondary | ICD-10-CM | POA: Diagnosis not present

## 2015-12-06 DIAGNOSIS — E1121 Type 2 diabetes mellitus with diabetic nephropathy: Secondary | ICD-10-CM | POA: Diagnosis not present

## 2015-12-06 DIAGNOSIS — I5030 Unspecified diastolic (congestive) heart failure: Secondary | ICD-10-CM | POA: Diagnosis not present

## 2015-12-06 DIAGNOSIS — M4712 Other spondylosis with myelopathy, cervical region: Secondary | ICD-10-CM | POA: Diagnosis not present

## 2015-12-08 DIAGNOSIS — E1121 Type 2 diabetes mellitus with diabetic nephropathy: Secondary | ICD-10-CM | POA: Diagnosis not present

## 2015-12-08 DIAGNOSIS — I5030 Unspecified diastolic (congestive) heart failure: Secondary | ICD-10-CM | POA: Diagnosis not present

## 2015-12-08 DIAGNOSIS — M4712 Other spondylosis with myelopathy, cervical region: Secondary | ICD-10-CM | POA: Diagnosis not present

## 2015-12-08 NOTE — Progress Notes (Signed)
Sounds good to me  TK

## 2015-12-09 ENCOUNTER — Telehealth: Payer: Self-pay | Admitting: Radiation Oncology

## 2015-12-09 ENCOUNTER — Ambulatory Visit (HOSPITAL_COMMUNITY)
Admission: RE | Admit: 2015-12-09 | Discharge: 2015-12-09 | Disposition: A | Discharge status: Home / Self Care | Payer: Medicare Other | Source: Ambulatory Visit | Attending: Radiation Oncology | Admitting: Radiation Oncology

## 2015-12-09 DIAGNOSIS — C189 Malignant neoplasm of colon, unspecified: Secondary | ICD-10-CM | POA: Diagnosis not present

## 2015-12-09 DIAGNOSIS — R911 Solitary pulmonary nodule: Secondary | ICD-10-CM | POA: Diagnosis not present

## 2015-12-09 DIAGNOSIS — C7800 Secondary malignant neoplasm of unspecified lung: Secondary | ICD-10-CM

## 2015-12-09 MED ORDER — IOPAMIDOL (ISOVUE-300) INJECTION 61%
75.0000 mL | Freq: Once | INTRAVENOUS | Status: AC | PRN
  Administered 2015-12-09: 75 mL via INTRAVENOUS

## 2015-12-09 MED ORDER — HEPARIN SOD (PORK) LOCK FLUSH 100 UNIT/ML IV SOLN
INTRAVENOUS | Status: AC
  Filled 2015-12-09: qty 5

## 2015-12-09 NOTE — Telephone Encounter (Signed)
I called the patient to review his scan results. We'd recommend repeat imaging in 6 months time, and I will place orders for this. He prefers to have CTs at [facility] and to follow up moving forward in [place]. I will place orders for follow up scans and appt in [facility] with Dr. [name]. He also will need regular port flushes q6-8 weeks and will contact Dr. Donald Pore office to coordinate.

## 2015-12-10 DIAGNOSIS — E1121 Type 2 diabetes mellitus with diabetic nephropathy: Secondary | ICD-10-CM | POA: Diagnosis not present

## 2015-12-10 DIAGNOSIS — M4712 Other spondylosis with myelopathy, cervical region: Secondary | ICD-10-CM | POA: Diagnosis not present

## 2015-12-10 DIAGNOSIS — I5030 Unspecified diastolic (congestive) heart failure: Secondary | ICD-10-CM | POA: Diagnosis not present

## 2015-12-13 DIAGNOSIS — I5030 Unspecified diastolic (congestive) heart failure: Secondary | ICD-10-CM | POA: Diagnosis not present

## 2015-12-13 DIAGNOSIS — M4712 Other spondylosis with myelopathy, cervical region: Secondary | ICD-10-CM | POA: Diagnosis not present

## 2015-12-13 DIAGNOSIS — E1121 Type 2 diabetes mellitus with diabetic nephropathy: Secondary | ICD-10-CM | POA: Diagnosis not present

## 2015-12-16 DIAGNOSIS — E1121 Type 2 diabetes mellitus with diabetic nephropathy: Secondary | ICD-10-CM | POA: Diagnosis not present

## 2015-12-16 DIAGNOSIS — I5030 Unspecified diastolic (congestive) heart failure: Secondary | ICD-10-CM | POA: Diagnosis not present

## 2015-12-16 DIAGNOSIS — M4712 Other spondylosis with myelopathy, cervical region: Secondary | ICD-10-CM | POA: Diagnosis not present

## 2015-12-17 DIAGNOSIS — E1121 Type 2 diabetes mellitus with diabetic nephropathy: Secondary | ICD-10-CM | POA: Diagnosis not present

## 2015-12-17 DIAGNOSIS — M4712 Other spondylosis with myelopathy, cervical region: Secondary | ICD-10-CM | POA: Diagnosis not present

## 2015-12-17 DIAGNOSIS — I5030 Unspecified diastolic (congestive) heart failure: Secondary | ICD-10-CM | POA: Diagnosis not present

## 2015-12-23 DIAGNOSIS — Z794 Long term (current) use of insulin: Secondary | ICD-10-CM | POA: Diagnosis not present

## 2015-12-23 DIAGNOSIS — I10 Essential (primary) hypertension: Secondary | ICD-10-CM | POA: Diagnosis not present

## 2015-12-23 DIAGNOSIS — E119 Type 2 diabetes mellitus without complications: Secondary | ICD-10-CM | POA: Diagnosis not present

## 2016-01-24 ENCOUNTER — Encounter (HOSPITAL_COMMUNITY): Payer: Medicare Other

## 2016-01-25 ENCOUNTER — Encounter (HOSPITAL_COMMUNITY): Payer: Medicare Other | Attending: Oncology

## 2016-01-25 ENCOUNTER — Encounter (HOSPITAL_COMMUNITY): Payer: Self-pay

## 2016-01-25 VITALS — BP 145/57 | HR 77 | Temp 97.8°F | Resp 18

## 2016-01-25 DIAGNOSIS — R911 Solitary pulmonary nodule: Secondary | ICD-10-CM | POA: Insufficient documentation

## 2016-01-25 DIAGNOSIS — C189 Malignant neoplasm of colon, unspecified: Secondary | ICD-10-CM

## 2016-01-25 DIAGNOSIS — R938 Abnormal findings on diagnostic imaging of other specified body structures: Secondary | ICD-10-CM | POA: Insufficient documentation

## 2016-01-25 DIAGNOSIS — Z452 Encounter for adjustment and management of vascular access device: Secondary | ICD-10-CM

## 2016-01-25 DIAGNOSIS — C7800 Secondary malignant neoplasm of unspecified lung: Secondary | ICD-10-CM

## 2016-01-25 MED ORDER — HEPARIN SOD (PORK) LOCK FLUSH 100 UNIT/ML IV SOLN
500.0000 [IU] | Freq: Once | INTRAVENOUS | Status: AC
  Administered 2016-01-25: 500 [IU] via INTRAVENOUS
  Filled 2016-01-25: qty 5

## 2016-01-25 MED ORDER — SODIUM CHLORIDE 0.9% FLUSH
20.0000 mL | INTRAVENOUS | Status: DC | PRN
  Administered 2016-01-25: 20 mL via INTRAVENOUS
  Filled 2016-01-25: qty 20

## 2016-01-25 NOTE — Patient Instructions (Signed)
[place] at [facility] Discharge Instructions  RECOMMENDATIONS MADE BY THE CONSULTANT AND ANY TEST RESULTS WILL BE SENT TO YOUR REFERRING PHYSICIAN.  Port flush  Thank you for choosing [place] at [facility] to provide your oncology and hematology care.  To afford each patient quality time with our provider, please arrive at least 15 minutes before your scheduled appointment time.   Beginning January 23rd 2017 lab work for the [facility] will be done in the  Main lab at [facility] on 1st floor. If you have a lab appointment with the [facility] please come in thru the  Main Entrance and check in at the main information desk  You need to re-schedule your appointment should you arrive 10 or more minutes late.  We strive to give you quality time with our providers, and arriving late affects you and other patients whose appointments are after yours.  Also, if you no show three or more times for appointments you may be dismissed from the clinic at the providers discretion.     Again, thank you for choosing [facility].  Our hope is that these requests will decrease the amount of time that you wait before being seen by our physicians.       _____________________________________________________________  Should you have questions after your visit to [facility], please contact our office at [phone] between the hours of 8:30 a.m. and 4:30 p.m.  Voicemails left after 4:30 p.m. will not be returned until the following business day.  For prescription refill requests, have your pharmacy contact our office.         Resources For Cancer Patients and their Caregivers ? American Cancer Society: Can assist with transportation, wigs, general needs, runs Look Good Feel Better.        780 830 7174 ? Cancer Care: Provides financial assistance, online support groups, medication/co-pay assistance.  1-800-813-HOPE  218-260-7638) ? [place] Assists [place] cancer patients and their families through emotional , educational and financial support.  769-335-9305 ? [place] DSS Where to apply for food stamps, Medicaid and utility assistance. (909) 440-9883 ? RCATS: Transportation to medical appointments. (279)707-5104 ? Social Security Administration: May apply for disability if have a Stage IV cancer. 316-867-2752 6306118621 ? [employer], Disability and Transit Services: Assists with nutrition, care and transit needs. [place] Support Programs: '@10RELATIVEDAYS'$ @ > Cancer Support Group  2nd Tuesday of the month 1pm-2pm, Journey Room  > Creative Journey  3rd Tuesday of the month 1130am-1pm, Journey Room  > Look Good Feel Better  1st Wednesday of the month 10am-12 noon, Journey Room (Call [place] to register [phone])

## 2016-01-25 NOTE — Progress Notes (Signed)
Gary Nelson presented for Portacath access and flush. Proper placement of portacath confirmed by CXR. Portacath located right chest wall accessed with  H 20 needle. No blood return and flushed well with no pain or discomfot. Portacath flushed with 68m NS and 500U/537mHeparin and needle removed intact. Procedure without incident. Patient tolerated procedure well.

## 2016-02-10 ENCOUNTER — Encounter (HOSPITAL_COMMUNITY): Payer: Medicare Other

## 2016-02-10 ENCOUNTER — Encounter (HOSPITAL_COMMUNITY): Payer: Medicare Other | Attending: Oncology | Admitting: Oncology

## 2016-02-10 VITALS — BP 132/47 | HR 89 | Temp 97.6°F | Resp 18 | Wt 261.9 lb

## 2016-02-10 DIAGNOSIS — C189 Malignant neoplasm of colon, unspecified: Secondary | ICD-10-CM | POA: Insufficient documentation

## 2016-02-10 DIAGNOSIS — C7801 Secondary malignant neoplasm of right lung: Secondary | ICD-10-CM

## 2016-02-10 DIAGNOSIS — G620 Drug-induced polyneuropathy: Secondary | ICD-10-CM | POA: Diagnosis not present

## 2016-02-10 LAB — CBC WITH DIFFERENTIAL/PLATELET
Basophils Absolute: 0.1 10*3/uL (ref 0.0–0.1)
Basophils Relative: 1 %
Eosinophils Absolute: 0.1 10*3/uL (ref 0.0–0.7)
Eosinophils Relative: 1 %
HCT: 41.3 % (ref 39.0–52.0)
Hemoglobin: 13.8 g/dL (ref 13.0–17.0)
Lymphocytes Relative: 15 %
Lymphs Abs: 1.4 10*3/uL (ref 0.7–4.0)
MCH: 31.2 pg (ref 26.0–34.0)
MCHC: 33.4 g/dL (ref 30.0–36.0)
MCV: 93.2 fL (ref 78.0–100.0)
Monocytes Absolute: 1 10*3/uL (ref 0.1–1.0)
Monocytes Relative: 10 %
Neutro Abs: 6.6 10*3/uL (ref 1.7–7.7)
Neutrophils Relative %: 73 %
Platelets: 285 10*3/uL (ref 150–400)
RBC: 4.43 MIL/uL (ref 4.22–5.81)
RDW: 14.3 % (ref 11.5–15.5)
WBC: 9.1 10*3/uL (ref 4.0–10.5)

## 2016-02-10 LAB — COMPREHENSIVE METABOLIC PANEL
ALT: 16 U/L — ABNORMAL LOW (ref 17–63)
AST: 19 U/L (ref 15–41)
Albumin: 3.6 g/dL (ref 3.5–5.0)
Alkaline Phosphatase: 85 U/L (ref 38–126)
Anion gap: 9 (ref 5–15)
BUN: 25 mg/dL — ABNORMAL HIGH (ref 6–20)
CO2: 28 mmol/L (ref 22–32)
Calcium: 9.2 mg/dL (ref 8.9–10.3)
Chloride: 99 mmol/L — ABNORMAL LOW (ref 101–111)
Creatinine, Ser: 1.13 mg/dL (ref 0.61–1.24)
GFR calc Af Amer: >60 mL/min (ref >60)
GFR calc non Af Amer: >60 mL/min (ref >60)
Glucose, Bld: 143 mg/dL — ABNORMAL HIGH (ref 65–99)
Potassium: 4.9 mmol/L (ref 3.5–5.1)
Sodium: 136 mmol/L (ref 135–145)
Total Bilirubin: 0.5 mg/dL (ref 0.3–1.2)
Total Protein: 7.5 g/dL (ref 6.5–8.1)

## 2016-02-10 NOTE — Patient Instructions (Addendum)
[place] at [facility] Discharge Instructions  RECOMMENDATIONS MADE BY THE CONSULTANT AND ANY TEST RESULTS WILL BE SENT TO YOUR REFERRING PHYSICIAN.  CT chest Feb 2018  Labs today - done  Labs in 3 months  CT CAP with contrast in May 2018  Return in 3 months for follow up with Gershon Mussel   Thank you for choosing [place] at [facility] to provide your oncology and hematology care.  To afford each patient quality time with our provider, please arrive at least 15 minutes before your scheduled appointment time.    If you have a lab appointment with the [facility] please come in thru the  Main Entrance and check in at the main information desk  You need to re-schedule your appointment should you arrive 10 or more minutes late.  We strive to give you quality time with our providers, and arriving late affects you and other patients whose appointments are after yours.  Also, if you no show three or more times for appointments you may be dismissed from the clinic at the providers discretion.     Again, thank you for choosing [facility].  Our hope is that these requests will decrease the amount of time that you wait before being seen by our physicians.       _____________________________________________________________  Should you have questions after your visit to [facility], please contact our office at [phone] between the hours of 8:30 a.m. and 4:30 p.m.  Voicemails left after 4:30 p.m. will not be returned until the following business day.  For prescription refill requests, have your pharmacy contact our office.       Resources For Cancer Patients and their Caregivers ? American Cancer Society: Can assist with transportation, wigs, general needs, runs Look Good Feel Better.        (920)464-4974 ? Cancer Care: Provides financial assistance, online support groups, medication/co-pay assistance.   1-800-813-HOPE (719)185-5350) ? [place] Assists [place] cancer patients and their families through emotional , educational and financial support.  727-568-9170 ? [place] DSS Where to apply for food stamps, Medicaid and utility assistance. (684)415-2225 ? RCATS: Transportation to medical appointments. (802) 852-4009 ? Social Security Administration: May apply for disability if have a Stage IV cancer. 719-620-3913 707-429-1337 ? [employer], Disability and Transit Services: Assists with nutrition, care and transit needs. [place] Support Programs: '@10RELATIVEDAYS'$ @ > Cancer Support Group  2nd Tuesday of the month 1pm-2pm, Journey Room  > Creative Journey  3rd Tuesday of the month 1130am-1pm, Journey Room  > Look Good Feel Better  1st Wednesday of the month 10am-12 noon, Journey Room (Call [place] to register [phone])

## 2016-02-10 NOTE — Progress Notes (Signed)
Gary Lair, MD [place] [address]  Adenocarcinoma of colon [facility]) - Plan: CT Chest Wo Contrast, CT Chest W Contrast, CT Abdomen Pelvis W Contrast, CBC with Differential, Comprehensive metabolic panel, CEA, CBC with Differential, Comprehensive metabolic panel, CEA  Malignant neoplasm metastatic to right lung (HCC) - Plan: CT Chest Wo Contrast, CT Chest W Contrast, CT Abdomen Pelvis W Contrast, CBC with Differential, Comprehensive metabolic panel, CEA  CURRENT THERAPY: Surveillance per NCCN guidelines.  INTERVAL HISTORY: Gary Nelson 76 y.o. 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 [name] followed by 6 months worth of adjuvant chemotherapy consisting of FOLFOX with a change in treatment to infusional 5-FU for progressive peripheral neuropathy to finish 6 months worth of treatment by Dr. [name].  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 [name].    Adenocarcinoma of colon ([place])   10/04/2012 Def[facility]itive Surgery    Partial collectomy by Dr. [name]      10/04/2012 Pathology Results    4.5 cm poorly differentiated adenocarcinoma, negative margins, 0/7 lymph nodes for metastatic disease.  Oncotype recurrence score of 26 (high)       Chemotherapy    FOLFOX       Adverse Reaction    Progressive peripheral neuropathy       Chemotherapy    Infusional 5 FU.  Completed 6 months worth of adjuvant therapy.      02/23/2014 Procedure    Colonoscopy by Dr. Anthony Sar.      07/01/2015 PET scan    There is malignant range uptake with RLL pulmonary nodule suspicious for metastatic disease or primary pulm neoplasm. Nonspecific focus of uptake in the L femoral neck. No  corresponding CT abnormality. Cannot rule out metastasis.      07/06/2015 Imaging    MR C-spine- C5-6 there is a broad-based disc bulge w R paracentral disc protrusion deforming the spinal cord. Moderate R foraminal stenosis. C6-7 there is a central disc protrusion slightly eccentric towards the R and mildly deforming the ventral c-spine       09/16/2015 Procedure    RLL needle biopsy by IR.      09/17/2015 Pathology Results    Metastatic adenocarcinoma consistent with a primary colorectal adenocarcinoma.      10/12/2015 - 10/22/2015 Radiation Therapy    SBRT by Dr. [name] to RLL pulmonary lesion (biopsy proven to be oligometastasis).      12/09/2015 Imaging    CT chest- 1. Slight interval decrease in size of the right lower lobe pulmonary nodule. Some surrounding radiation changes. 2. No mediastinal or hilar mass or adenopathy. 3. No new pulmonary nodules. 4. Stable underline emphysematous changes. 5. Cirrhotic changes involving the liver but no upper abdominal metastatic disease.       He is doing well without any new or active complaints.  He is aware of his CT imaging results from November 2017.  He denies any new bowel complaints or abdominal complaints.  He denies any change in bowel habits, caliber, color, or frequency.  He denies any blood in his stool or dark stools.  He denies any change in appetite, nausea or vomiting.  Review of Systems  Constitutional: Negative.  Negative for chills, fever and weight  loss.  HENT: Negative.   Eyes: Negative.  Negative for blurred vision.  Respiratory: Negative.  Negative for cough.   Cardiovascular: Negative.  Negative for chest pain.  Gastrointestinal: Negative.  Negative for abdominal pain, nausea and vomiting.  Genitourinary: Negative.   Musculoskeletal: Negative.   Skin: Negative.   Neurological: Negative.  Negative for weakness.  Endo/Heme/Allergies: Negative.   Psychiatric/Behavioral: Negative.     Past Medical History:   Diagnosis Date  . Adenocarcinoma of colon ([place])    RLL -  Pulmonary nodule (06/2015, concerning for mets vs primary; to see RAD-ONC Dr. [name]); stage 2 adenocarinoma   2014 - Colon Cancer - surgery and chemo  . Blood infection ([place])   . Cystitis   . Diabetes mellitus without complication ([place])    Type II  . DVT (deep venous thrombosis) ([place]) 2014   post op  . Hematuria   . Hypertension   . Neuropathy ([place])   . Prostatitis   . Pulmonary nodule   . Sleep apnea     Past Surgical History:  Procedure Laterality Date  . BACK SURGERY    . CATARACT EXTRACTION     left  . CATARACT EXTRACTION W/PHACO Right 04/11/2012   Procedure: CATARACT EXTRACTION PHACO AND INTRAOCULAR LENS PLACEMENT (IOC);  Surgeon: Tonny Branch, MD;  Location: [facility] ORS;  Service: Ophthalmology;  Laterality: Right;  CDE:62.48  . CERVICAL FUSION     c4-c5  . CHOLECYSTECTOMY    . COLON SURGERY  09/29/12   Colon resection for cancer  . COLONOSCOPY    . EYE SURGERY     cataract  . GIVENS CAPSULE STUDY N/A 03/15/2015   Procedure: GIVENS CAPSULE STUDY;  Surgeon: Rogene Houston, MD;  Location: [facility] ENDO SUITE;  Service: Endoscopy;  Laterality: N/A;  730  . LEG SURGERY    . ORIF FEMUR FRACTURE     right  . POSTERIOR CERVICAL FUSION/FORAMINOTOMY N/A 07/27/2015   Procedure: Cervical four to Cervical seven Laminectomy with Cervical four to Thoracic one Dorsal internal fixation and fusion;  Surgeon: Kevan Ny Ditty, MD;  Location: [place] NEURO ORS;  Service: Neurosurgery;  Laterality: N/A;  C4 to C7 Laminectomy with C4 to T1 Dorsal internal fixation and fusion    Family History  Problem Relation Age of Onset  . Heart disease Father   . Heart attack Father   . Alzheimer's disease Mother     Social History   Social History  . Marital status: Single    Spouse name: N/A  . Number of children: N/A  . Years of education: N/A   Social History Main Topics  . Smoking status: Former Smoker    Packs/day: 1.00    Years: 55.00     Types: Cigarettes    Start date: 02/07/1955  . Smokeless tobacco: Never Used     Comment: quit 2015  . Alcohol use 1.8 oz/week    3 Cans of beer per week  . Drug use: No  . Sexual activity: Yes    Birth control/ protection: None   Other Topics Concern  . Not on file   Social History Narrative  . No narrative on file     PHYSICAL EXAMINATION  ECOG PERFORMANCE STATUS: 2 - Symptomatic, <50% confined to bed  Vitals:   02/10/16 1347  BP: (!) 132/47  Pulse: 89  Resp: 18  Temp: 97.6 F (36.4 C)    GENERAL:alert, well nourished, well developed, comfortable, cooperative, obese, smiling and in wheelchair, accompanied by brother.  SKIN: skin color, texture, turgor are normal, no rashes or significant lesions HEAD: Normocephalic, No masses, lesions, tenderness or abnormalities EYES: normal, EOMI, Conjunctiva are pink and non-injected EARS: External ears normal OROPHARYNX:lips, buccal mucosa, and tongue normal and mucous membranes are moist  NECK: supple, no adenopathy LYMPH:  no palpable lymphadenopathy BREAST:not examined LUNGS: clear to auscultation and percussion HEART: regular rate & rhythm, no murmurs, no gallops, S1 normal and S2 normal ABDOMEN:abdomen soft, non-tender, obese and normal bowel sounds BACK: No CVA tenderness EXTREMITIES:less then 2 second capillary refill, no joint deformities, effusion, or inflammation, no skin discoloration, no cyanosis  NEURO: alert & oriented x 3 with fluent speech, no focal motor/sensory deficits in wheelchair.   LABORATORY DATA: CBC    Component Value Date/Time   WBC 9.1 02/10/2016 1325   RBC 4.43 02/10/2016 1325   HGB 13.8 02/10/2016 1325   HCT 41.3 02/10/2016 1325   PLT 285 02/10/2016 1325   MCV 93.2 02/10/2016 1325   MCH 31.2 02/10/2016 1325   MCHC 33.4 02/10/2016 1325   RDW 14.3 02/10/2016 1325   LYMPHSABS 1.4 02/10/2016 1325   MONOABS 1.0 02/10/2016 1325   EOSABS 0.1 02/10/2016 1325   BASOSABS 0.1 02/10/2016 1325       Chemistry      Component Value Date/Time   NA 136 02/10/2016 1325   K 4.9 02/10/2016 1325   CL 99 (L) 02/10/2016 1325   CO2 28 02/10/2016 1325   BUN 25 (H) 02/10/2016 1325   CREATININE 1.13 02/10/2016 1325      Component Value Date/Time   CALCIUM 9.2 02/10/2016 1325   ALKPHOS 85 02/10/2016 1325   AST 19 02/10/2016 1325   ALT 16 (L) 02/10/2016 1325   BILITOT 0.5 02/10/2016 1325     Lab Results  Component Value Date   CEA 2.9 11/08/2015     PENDING LABS:   RADIOGRAPHIC STUDIES:  No results found.   PATHOLOGY:    ASSESSMENT AND PLAN:  Adenocarcinoma of colon ([place]) Stage IV adenocarcinoma with a positive biopsy for adenocarcinoma of colorectal primary of RLL pulmonary nodule (oligometastasis) with a history of Stage II colorectal adenocarcinoma having finished curative treatment in 2014-2015 consisting of partial colectomy on 10/04/2012 by Dr. [name] followed by 6 months worth of adjuvant chemotherapy consisting of FOLFOX with a change in treatment to infusional 5-FU for progressive peripheral neuropathy to finish 6 months worth of treatment by Dr. [name].  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 [name].  Oncology history is updated.  Labs today: CBC diff, CMET, CEA.  I personally reviewed and went over laboratory results with the patient.  The results are noted within this dictation.  Labs in 3 months: CBC diff, CMET, CEA.  Order is placed for CT of chest wo contrast in Feb 2018 (3 months from his previous).  He will be due for his annual surveillance imaging for CRC in May 2018.  Will order CT CAP w contrast for May 2018.  Return in 3 months for follow-up.   ORDERS PLACED FOR THIS ENCOUNTER: Orders Placed This Encounter  Procedures  . CT Chest Wo Contrast  . CT Chest W Contrast  . CT Abdomen Pelvis W Contrast  . CBC with Differential  . Comprehensive metabolic panel  . CEA  . CBC with Differential  . Comprehensive  metabolic panel  . CEA    MEDICATIONS PRESCRIBED THIS ENCOUNTER: No orders of the defined types were placed in this encounter.  THERAPY PLAN:  NCCN guidelines for surveillance for Colon cancer are as follows (1.2017):  A. Stage I   1. Colonoscopy at year 1    A. If advanced adenoma, repeat in 1 year    B. If no advanced adenoma, repeat in 3 years, and then every 5 years.  B. Stage II, Stage III   1. H+P every 3-6 months x 2 years and then every 6 months for a total of 5 years    2. CEA every 3-6 months x 2 years and then every 6 months for a total of 5 years    3. CT CAP every 6-12 months (category 2B for frequency < 12 months) for a total of 5 years .   4.  Colonoscopy in 1 year except if no preoperative colonoscopy due to obstructing lesion, colonoscopy in 3-6 months.     A. If advanced adenoma, repeat in 1 year    B. If no advanced adenoma, repeat in 3 years, then every 5 years   5. PET/CT scan is not recommended.  C. Stage IV   1. H+P every 3-6 months x 2 years and then every 6 months for a total of 5 years    2. CEA every 3 months x 2 years and then every 6 months for a total of 3- 5 years    3. CT CAP every 3-6 months (category 2B for frequency < 6 months) x 2 years., then every 6-12 months for a total of 5 years .   4. Colonoscopy in 1 year except if no preoperative colonoscopy due to obstructing lesion, colonoscopy in 3-6 months.     A. If advanced adenoma, repeat in 1 year    B. If no advanced adenoma, repeat in 3 years, then every 5 years   All questions were answered. The patient knows to call the clinic with any problems, questions or concerns. We can certainly see the patient much sooner if necessary.  Patient and plan discussed with Dr. Ancil Linsey and she is in agreement with the aforementioned.   This note is electronically signed by: Doy Mince 02/10/2016 5:48 PM

## 2016-02-10 NOTE — Assessment & Plan Note (Signed)
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 [name] followed by 6 months worth of adjuvant chemotherapy consisting of FOLFOX with a change in treatment to infusional 5-FU for progressive peripheral neuropathy to finish 6 months worth of treatment by Dr. [name].  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 [name].  Oncology history is updated.  Labs today: CBC diff, CMET, CEA.  I personally reviewed and went over laboratory results with the patient.  The results are noted within this dictation.  Labs in 3 months: CBC diff, CMET, CEA.  Order is placed for CT of chest wo contrast in Feb 2018 (3 months from his previous).  He will be due for his annual surveillance imaging for CRC in May 2018.  Will order CT CAP w contrast for May 2018.  Return in 3 months for follow-up.

## 2016-02-11 LAB — CEA: CEA: 3.8 ng/mL (ref 0.0–4.7)

## 2016-03-10 ENCOUNTER — Encounter (HOSPITAL_COMMUNITY): Payer: Medicare Other | Attending: Oncology

## 2016-03-10 ENCOUNTER — Encounter (HOSPITAL_COMMUNITY): Payer: Self-pay

## 2016-03-10 VITALS — BP 130/45 | HR 92 | Temp 97.8°F | Resp 20 | Wt 262.7 lb

## 2016-03-10 DIAGNOSIS — R938 Abnormal findings on diagnostic imaging of other specified body structures: Secondary | ICD-10-CM | POA: Insufficient documentation

## 2016-03-10 DIAGNOSIS — Z452 Encounter for adjustment and management of vascular access device: Secondary | ICD-10-CM

## 2016-03-10 DIAGNOSIS — C189 Malignant neoplasm of colon, unspecified: Secondary | ICD-10-CM

## 2016-03-10 DIAGNOSIS — R911 Solitary pulmonary nodule: Secondary | ICD-10-CM | POA: Insufficient documentation

## 2016-03-10 DIAGNOSIS — Z95828 Presence of other vascular implants and grafts: Secondary | ICD-10-CM

## 2016-03-10 MED ORDER — SODIUM CHLORIDE 0.9% FLUSH
10.0000 mL | INTRAVENOUS | Status: DC | PRN
  Administered 2016-03-10: 10 mL via INTRAVENOUS
  Filled 2016-03-10: qty 10

## 2016-03-10 MED ORDER — HEPARIN SOD (PORK) LOCK FLUSH 100 UNIT/ML IV SOLN
500.0000 [IU] | Freq: Once | INTRAVENOUS | Status: AC
  Administered 2016-03-10: 500 [IU] via INTRAVENOUS
  Filled 2016-03-10: qty 5

## 2016-03-10 NOTE — Patient Instructions (Signed)
[place] at [facility] Discharge Instructions  RECOMMENDATIONS MADE BY THE CONSULTANT AND ANY TEST RESULTS WILL BE SENT TO YOUR REFERRING PHYSICIAN.  Your port a cath was flushed today Follow up as scheduled.  Thank you for choosing [place] at [facility] to provide your oncology and hematology care.  To afford each patient quality time with our provider, please arrive at least 15 minutes before your scheduled appointment time.    If you have a lab appointment with the [place] please come in thru the  Main Entrance and check in at the main information desk  You need to re-schedule your appointment should you arrive 10 or more minutes late.  We strive to give you quality time with our providers, and arriving late affects you and other patients whose appointments are after yours.  Also, if you no show three or more times for appointments you may be dismissed from the clinic at the providers discretion.     Again, thank you for choosing [facility].  Our hope is that these requests will decrease the amount of time that you wait before being seen by our physicians.       _____________________________________________________________  Should you have questions after your visit to [facility], please contact our office at [phone] between the hours of 8:30 a.m. and 4:30 p.m.  Voicemails left after 4:30 p.m. will not be returned until the following business day.  For prescription refill requests, have your pharmacy contact our office.       Resources For Cancer Patients and their Caregivers ? American Cancer Society: Can assist with transportation, wigs, general needs, runs Look Good Feel Better.        ([phone] ? Cancer Care: Provides financial assistance, online support groups, medication/co-pay assistance.  1-800-813-HOPE (704)629-0188) ? [place] Assists [place] cancer  patients and their families through emotional , educational and financial support.  2816792810 ? [place] DSS Where to apply for food stamps, Medicaid and utility assistance. 610-192-2173 ? RCATS: Transportation to medical appointments. 262-150-2083 ? Social Security Administration: May apply for disability if have a Stage IV cancer. [phone])201-6053 ? [employer], Disability and Transit Services: Assists with nutrition, care and transit needs. [place] Support Programs: '@10RELATIVEDAYS'$ @ > Cancer Support Group  2nd Tuesday of the month 1pm-2pm, Journey Room  > Creative Journey  3rd Tuesday of the month 1130am-1pm, Journey Room  > Look Good Feel Better  1st Wednesday of the month 10am-12 noon, Journey Room (Call [place] to register [phone])

## 2016-03-10 NOTE — Progress Notes (Signed)
Gary Nelson presented for Portacath access and flush. Proper placement of portacath confirmed by CXR. Portacath located right chest wall accessed with  H 20 needle. No blood return and port flushed without resistance. Portacath flushed with 53m NS and 500U/522mHeparin and needle removed intact. Procedure without incident. Patient tolerated procedure well. Patient discharged via wheelchair.  Follow up as scheduled.

## 2016-03-14 ENCOUNTER — Ambulatory Visit (HOSPITAL_COMMUNITY)
Admission: RE | Admit: 2016-03-14 | Discharge: 2016-03-14 | Disposition: A | Discharge status: Home / Self Care | Payer: Medicare Other | Source: Ambulatory Visit | Attending: Oncology | Admitting: Oncology

## 2016-03-14 DIAGNOSIS — C189 Malignant neoplasm of colon, unspecified: Secondary | ICD-10-CM | POA: Insufficient documentation

## 2016-03-14 DIAGNOSIS — C3431 Malignant neoplasm of lower lobe, right bronchus or lung: Secondary | ICD-10-CM | POA: Diagnosis not present

## 2016-03-14 DIAGNOSIS — I2510 Atherosclerotic heart disease of native coronary artery without angina pectoris: Secondary | ICD-10-CM | POA: Insufficient documentation

## 2016-03-14 DIAGNOSIS — J439 Emphysema, unspecified: Secondary | ICD-10-CM | POA: Diagnosis not present

## 2016-03-14 DIAGNOSIS — I700 Atherosclerosis of aorta: Secondary | ICD-10-CM | POA: Insufficient documentation

## 2016-03-14 DIAGNOSIS — C7801 Secondary malignant neoplasm of right lung: Secondary | ICD-10-CM | POA: Diagnosis not present

## 2016-03-21 ENCOUNTER — Encounter (HOSPITAL_COMMUNITY): Payer: Medicare Other

## 2016-05-04 DIAGNOSIS — E119 Type 2 diabetes mellitus without complications: Secondary | ICD-10-CM | POA: Diagnosis not present

## 2016-05-04 DIAGNOSIS — I10 Essential (primary) hypertension: Secondary | ICD-10-CM | POA: Diagnosis not present

## 2016-05-10 ENCOUNTER — Encounter (HOSPITAL_COMMUNITY): Payer: Medicare Other | Attending: Oncology

## 2016-05-10 ENCOUNTER — Encounter (HOSPITAL_COMMUNITY): Payer: Medicare Other | Attending: Oncology | Admitting: Oncology

## 2016-05-10 ENCOUNTER — Encounter (HOSPITAL_COMMUNITY): Payer: Self-pay | Admitting: Oncology

## 2016-05-10 ENCOUNTER — Ambulatory Visit (HOSPITAL_COMMUNITY): Payer: Medicare Other | Admitting: Oncology

## 2016-05-10 DIAGNOSIS — C189 Malignant neoplasm of colon, unspecified: Secondary | ICD-10-CM | POA: Diagnosis present

## 2016-05-10 DIAGNOSIS — C7801 Secondary malignant neoplasm of right lung: Secondary | ICD-10-CM | POA: Insufficient documentation

## 2016-05-10 DIAGNOSIS — R938 Abnormal findings on diagnostic imaging of other specified body structures: Secondary | ICD-10-CM | POA: Insufficient documentation

## 2016-05-10 DIAGNOSIS — R911 Solitary pulmonary nodule: Secondary | ICD-10-CM | POA: Insufficient documentation

## 2016-05-10 LAB — CBC WITH DIFFERENTIAL/PLATELET
Basophils Absolute: 0 10*3/uL (ref 0.0–0.1)
Basophils Relative: 1 %
Eosinophils Absolute: 0.1 10*3/uL (ref 0.0–0.7)
Eosinophils Relative: 1 %
HCT: 37.3 % — ABNORMAL LOW (ref 39.0–52.0)
Hemoglobin: 12.7 g/dL — ABNORMAL LOW (ref 13.0–17.0)
Lymphocytes Relative: 20 %
Lymphs Abs: 1.7 10*3/uL (ref 0.7–4.0)
MCH: 31.7 pg (ref 26.0–34.0)
MCHC: 34 g/dL (ref 30.0–36.0)
MCV: 93 fL (ref 78.0–100.0)
Monocytes Absolute: 0.7 10*3/uL (ref 0.1–1.0)
Monocytes Relative: 8 %
Neutro Abs: 6.2 10*3/uL (ref 1.7–7.7)
Neutrophils Relative %: 70 %
Platelets: 290 10*3/uL (ref 150–400)
RBC: 4.01 MIL/uL — ABNORMAL LOW (ref 4.22–5.81)
RDW: 13.7 % (ref 11.5–15.5)
WBC: 8.7 10*3/uL (ref 4.0–10.5)

## 2016-05-10 LAB — COMPREHENSIVE METABOLIC PANEL
ALT: 15 U/L — ABNORMAL LOW (ref 17–63)
AST: 16 U/L (ref 15–41)
Albumin: 3.6 g/dL (ref 3.5–5.0)
Alkaline Phosphatase: 69 U/L (ref 38–126)
Anion gap: 9 (ref 5–15)
BUN: 35 mg/dL — ABNORMAL HIGH (ref 6–20)
CO2: 24 mmol/L (ref 22–32)
Calcium: 9.1 mg/dL (ref 8.9–10.3)
Chloride: 102 mmol/L (ref 101–111)
Creatinine, Ser: 1.4 mg/dL — ABNORMAL HIGH (ref 0.61–1.24)
GFR calc Af Amer: 56 mL/min — ABNORMAL LOW (ref >60)
GFR calc non Af Amer: 48 mL/min — ABNORMAL LOW (ref >60)
Glucose, Bld: 87 mg/dL (ref 65–99)
Potassium: 4.9 mmol/L (ref 3.5–5.1)
Sodium: 135 mmol/L (ref 135–145)
Total Bilirubin: 0.5 mg/dL (ref 0.3–1.2)
Total Protein: 7.5 g/dL (ref 6.5–8.1)

## 2016-05-10 MED ORDER — SODIUM CHLORIDE 0.9% FLUSH
10.0000 mL | INTRAVENOUS | Status: DC | PRN
  Administered 2016-05-10: 10 mL via INTRAVENOUS
  Filled 2016-05-10: qty 10

## 2016-05-10 MED ORDER — HEPARIN SOD (PORK) LOCK FLUSH 100 UNIT/ML IV SOLN
500.0000 [IU] | Freq: Once | INTRAVENOUS | Status: AC
  Administered 2016-05-10: 500 [IU] via INTRAVENOUS
  Filled 2016-05-10: qty 5

## 2016-05-10 NOTE — Progress Notes (Signed)
Gary Nelson presented for Portacath access and flush.  Portacath located rt chest wall accessed with  H 20 needle. No blood return present. Portacath flushed with 53m NS and 500U/534mHeparin and needle removed intact. Procedure without incident. Patient tolerated procedure well.  RoMelissa Montaneresented for labwork. Labs per MD order drawn via Peripheral Line 23 gauge needle inserted in RT AC  Good blood return present. Procedure without incident.  Needle removed intact. Patient tolerated procedure well.

## 2016-05-10 NOTE — Assessment & Plan Note (Signed)
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 [name] followed by 6 months worth of adjuvant chemotherapy consisting of FOLFOX with a change in treatment to infusional 5-FU for progressive peripheral neuropathy to finish 6 months worth of treatment by Dr. [name].  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 [name].  Oncology history is updated.  Labs today: CBC diff, CMET, CEA.  I personally reviewed and went over laboratory results with the patient.  The results are noted within this dictation.  Labs in 3 months: CBC diff, CMET, CEA.  I personally reviewed and went over radiographic studies with the patient.  The results are noted within this dictation.  CT chest demonstrates ongoing reduction in size of RLL lesion.  No evidence of new or progressive disease within the chest.  He is scheduled for CT CAP in May 2018 for ongoing surveillance in accordance with NCCN guidelines.  Return in 3 months for follow-up.

## 2016-05-10 NOTE — Progress Notes (Signed)
[name], MD [place] [address] [place] [address]  Adenocarcinoma of colon [facility])  CURRENT THERAPY: Surveillance per NCCN guidelines.  INTERVAL HISTORY: Gary Nelson 76 y.o. 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 [name] followed by 6 months worth of adjuvant chemotherapy consisting of FOLFOX with a change in treatment to infusional 5-FU for progressive peripheral neuropathy to finish 6 months worth of treatment by Dr. [name].  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 [name].    Adenocarcinoma of colon ([place])   10/04/2012 Definitive Surgery    Partial collectomy by Dr. [name]      10/04/2012 Pathology Results    4.5 cm poorly differentiated adenocarcinoma, negative margins, 0/7 lymph nodes for metastatic disease.  Oncotype recurrence score of 26 (high)       Chemotherapy    FOLFOX       Adverse Reaction    Progressive peripheral neuropathy       Chemotherapy    Infusional 5 FU.  Completed 6 months worth of adjuvant therapy.      02/23/2014 Procedure    Colonoscopy by Dr. Anthony Sar.      07/01/2015 PET scan    There is malignant range uptake with RLL pulmonary nodule suspicious for metastatic disease or primary pulm neoplasm. Nonspecific focus of uptake in the L femoral neck. No corresponding CT abnormality. Cannot rule out metastasis.      07/06/2015 Imaging    MR C-spine- C5-6 there is a broad-based disc bulge w R paracentral disc protrusion deforming the spinal cord. Moderate R foraminal stenosis. C6-7 there is a central disc protrusion slightly eccentric towards the R and mildly deforming the ventral c-spine       09/16/2015 Procedure    RLL needle  biopsy by IR.      09/17/2015 Pathology Results    Metastatic adenocarcinoma consistent with a primary colorectal adenocarcinoma.      10/12/2015 - 10/22/2015 Radiation Therapy    SBRT by Dr. [name] to RLL pulmonary lesion (biopsy proven to be oligometastasis).      12/09/2015 Imaging    CT chest- 1. Slight interval decrease in size of the right lower lobe pulmonary nodule. Some surrounding radiation changes. 2. No mediastinal or hilar mass or adenopathy. 3. No new pulmonary nodules. 4. Stable underline emphysematous changes. 5. Cirrhotic changes involving the liver but no upper abdominal metastatic disease.      03/14/2016 Imaging    CT chest- 1. Continued mild reduction in the size of the treated right lobe pulmonary nodule. 2. Increased patchy consolidation, reticulation and ground-glass attenuation in the basilar right lower lobe, nonspecific, favor evolving postradiation change, recommend attention on follow-up chest CT. 3. No evidence of new or progressive metastatic disease in the chest. 4. Aortic atherosclerosis. Left main and 3 vessel coronary atherosclerosis. 5. Mild emphysema.       He is doing very well.  He is in a wheelchair today which is normal for him.  He continues to have chronic back pain secondary to previous back surgeries.  His brother was not able to bring them today as he is out of town.  His brother's stepson brings in today.  He denies any new complaints.  He denies any new bowel changes including blood in stool, dark stool, change in frequency or caliber.  He denies any new shortness of breath or hemoptysis.  He denies any chest pain.  Appetite is good and weight is stable  Review of Systems  Constitutional: Negative.  Negative for chills, fever and weight loss.  HENT: Negative.   Eyes: Negative.   Respiratory: Negative.  Negative for cough.   Cardiovascular: Negative.  Negative for chest pain.  Gastrointestinal: Negative.  Negative for blood in  stool, constipation, diarrhea, melena, nausea and vomiting.  Genitourinary: Negative.   Musculoskeletal: Positive for back pain (chronic, stable).  Skin: Negative.   Neurological: Negative.  Negative for weakness.  Endo/Heme/Allergies: Negative.   Psychiatric/Behavioral: Negative.     Past Medical History:  Diagnosis Date  . Adenocarcinoma of colon ([place])    RLL -  Pulmonary nodule (06/2015, concerning for mets vs primary; to see RAD-ONC Dr. [name]); stage 2 adenocarinoma   2014 - Colon Cancer - surgery and chemo  . Blood infection ([place])   . Cystitis   . Diabetes mellitus without complication ([place])    Type II  . DVT (deep venous thrombosis) ([place]) 2014   post op  . Hematuria   . Hypertension   . Neuropathy ([place])   . Prostatitis   . Pulmonary nodule   . Sleep apnea     Past Surgical History:  Procedure Laterality Date  . BACK SURGERY    . CATARACT EXTRACTION     left  . CATARACT EXTRACTION W/PHACO Right 04/11/2012   Procedure: CATARACT EXTRACTION PHACO AND INTRAOCULAR LENS PLACEMENT (IOC);  Surgeon: Tonny Branch, MD;  Location: [facility] ORS;  Service: Ophthalmology;  Laterality: Right;  CDE:62.48  . CERVICAL FUSION     c4-c5  . CHOLECYSTECTOMY    . COLON SURGERY  09/29/12   Colon resection for cancer  . COLONOSCOPY    . EYE SURGERY     cataract  . GIVENS CAPSULE STUDY N/A 03/15/2015   Procedure: GIVENS CAPSULE STUDY;  Surgeon: Rogene Houston, MD;  Location: [facility] ENDO SUITE;  Service: Endoscopy;  Laterality: N/A;  730  . LEG SURGERY    . ORIF FEMUR FRACTURE     right  . POSTERIOR CERVICAL FUSION/FORAMINOTOMY N/A 07/27/2015   Procedure: Cervical four to Cervical seven Laminectomy with Cervical four to Thoracic one Dorsal internal fixation and fusion;  Surgeon: Kevan Ny Ditty, MD;  Location: [place] NEURO ORS;  Service: Neurosurgery;  Laterality: N/A;  C4 to C7 Laminectomy with C4 to T1 Dorsal internal fixation and fusion    Family History  Problem Relation Age of Onset  . Heart  disease Father   . Heart attack Father   . Alzheimer's disease Mother     Social History   Social History  . Marital status: Single    Spouse name: N/A  . Number of children: N/A  . Years of education: N/A   Social History Main Topics  . Smoking status: Former Smoker    Packs/day: 1.00    Years: 55.00    Types: Cigarettes    Start date: 02/07/1955  . Smokeless tobacco: Never Used     Comment: quit 2015  . Alcohol use 1.8 oz/week    3 Cans of beer per week  . Drug use: No  . Sexual activity: Yes    Birth control/ protection: None   Other Topics Concern  . None   Social History Narrative  . None  PHYSICAL EXAMINATION  ECOG PERFORMANCE STATUS: 2 - Symptomatic, <50% confined to bed  There were no vitals filed for this visit.  GENERAL:alert, well nourished, well developed, comfortable, cooperative, obese, smiling and in wheelchair, accompanied by brother's step-son. SKIN: skin color, texture, turgor are normal, no rashes or significant lesions HEAD: Normocephalic, No masses, lesions, tenderness or abnormalities EYES: normal, EOMI, Conjunctiva are pink and non-injected EARS: External ears normal OROPHARYNX:lips, buccal mucosa, and tongue normal and mucous membranes are moist  NECK: supple, no adenopathy LYMPH:  no palpable lymphadenopathy BREAST:not examined LUNGS: clear to auscultation and percussion without wheezes, rales or rhonchi. HEART: regular rate & rhythm, no murmurs, no gallops, S1 normal and S2 normal.  Port a cath in place in right chest wall. ABDOMEN:abdomen soft, non-tender, obese and normal bowel sounds BACK: No CVA tenderness EXTREMITIES:less then 2 second capillary refill, no joint deformities, effusion, or inflammation, no skin discoloration, no cyanosis  NEURO: alert & oriented x 3 with fluent speech, no focal motor/sensory deficits in wheelchair.   LABORATORY DATA: CBC    Component Value Date/Time   WBC 8.7 05/10/2016 1400   RBC 4.01 (L)  05/10/2016 1400   HGB 12.7 (L) 05/10/2016 1400   HCT 37.3 (L) 05/10/2016 1400   PLT 290 05/10/2016 1400   MCV 93.0 05/10/2016 1400   MCH 31.7 05/10/2016 1400   MCHC 34.0 05/10/2016 1400   RDW 13.7 05/10/2016 1400   LYMPHSABS 1.7 05/10/2016 1400   MONOABS 0.7 05/10/2016 1400   EOSABS 0.1 05/10/2016 1400   BASOSABS 0.0 05/10/2016 1400      Chemistry      Component Value Date/Time   NA 136 02/10/2016 1325   K 4.9 02/10/2016 1325   CL 99 (L) 02/10/2016 1325   CO2 28 02/10/2016 1325   BUN 25 (H) 02/10/2016 1325   CREATININE 1.13 02/10/2016 1325      Component Value Date/Time   CALCIUM 9.2 02/10/2016 1325   ALKPHOS 85 02/10/2016 1325   AST 19 02/10/2016 1325   ALT 16 (L) 02/10/2016 1325   BILITOT 0.5 02/10/2016 1325     Lab Results  Component Value Date   CEA 3.8 02/10/2016     PENDING LABS:   RADIOGRAPHIC STUDIES:  No results found.   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 [name] followed by 6 months worth of adjuvant chemotherapy consisting of FOLFOX with a change in treatment to infusional 5-FU for progressive peripheral neuropathy to finish 6 months worth of treatment by Dr. [name].  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 [name].  Oncology history is updated.  Labs today: CBC diff, CMET, CEA.  I personally reviewed and went over laboratory results with the patient.  The results are noted within this dictation.  Labs in 3 months: CBC diff, CMET, CEA.  I personally reviewed and went over radiographic studies with the patient.  The results are noted within this dictation.  CT chest demonstrates ongoing reduction  in size of RLL lesion.  No evidence of new or progressive disease within the chest.  He is scheduled for CT CAP in May 2018 for ongoing surveillance in accordance with NCCN guidelines.  Return in 3 months for follow-up.  ORDERS PLACED FOR THIS ENCOUNTER: No orders of the defined types were placed in this encounter.   MEDICATIONS PRESCRIBED THIS ENCOUNTER: No orders of the defined types were placed in this encounter.   THERAPY PLAN:  NCCN guidelines for surveillance for Colon cancer are as follows (1.2017):  A. Stage I   1. Colonoscopy at year 1    A. If advanced adenoma, repeat in 1 year    B. If no advanced adenoma, repeat in 3 years, and then every 5 years.  B. Stage II, Stage III   1. H+P every 3-6 months x 2 years and then every 6 months for a total of 5 years    2. CEA every 3-6 months x 2 years and then every 6 months for a total of 5 years    3. CT CAP every 6-12 months (category 2B for frequency < 12 months) for a total of 5 years .   4.  Colonoscopy in 1 year except if no preoperative colonoscopy due to obstructing lesion, colonoscopy in 3-6 months.     A. If advanced adenoma, repeat in 1 year    B. If no advanced adenoma, repeat in 3 years, then every 5 years   5. PET/CT scan is not recommended.  C. Stage IV   1. H+P every 3-6 months x 2 years and then every 6 months for a total of 5 years    2. CEA every 3 months x 2 years and then every 6 months for a total of 3- 5 years    3. CT CAP every 3-6 months (category 2B for frequency < 6 months) x 2 years., then every 6-12 months for a total of 5 years .   4. Colonoscopy in 1 year except if no preoperative colonoscopy due to obstructing lesion, colonoscopy in 3-6 months.     A. If advanced adenoma, repeat in 1 year    B. If no advanced adenoma, repeat in 3 years, then every 5 years   All questions were answered. The patient knows to call the clinic with any problems, questions or concerns. We can certainly see the patient  much sooner if necessary.  Patient and plan discussed with Dr. Twana First and she is in agreement with the aforementioned.   This note is electronically signed by: Doy Mince 05/10/2016 2:33 PM

## 2016-05-10 NOTE — Patient Instructions (Signed)
[place] at [facility] Discharge Instructions  RECOMMENDATIONS MADE BY THE CONSULTANT AND ANY TEST RESULTS WILL BE SENT TO YOUR REFERRING PHYSICIAN.  You were seen today by Kirby Crigler PA-C. CT scan in May as scheduled. Return as scheduled for follow up and labs.   Thank you for choosing [place] at [facility] to provide your oncology and hematology care.  To afford each patient quality time with our provider, please arrive at least 15 minutes before your scheduled appointment time.    If you have a lab appointment with the [place] please come in thru the  Main Entrance and check in at the main information desk  You need to re-schedule your appointment should you arrive 10 or more minutes late.  We strive to give you quality time with our providers, and arriving late affects you and other patients whose appointments are after yours.  Also, if you no show three or more times for appointments you may be dismissed from the clinic at the providers discretion.     Again, thank you for choosing [facility].  Our hope is that these requests will decrease the amount of time that you wait before being seen by our physicians.       _____________________________________________________________  Should you have questions after your visit to [facility], please contact our office at [phone] between the hours of 8:30 a.m. and 4:30 p.m.  Voicemails left after 4:30 p.m. will not be returned until the following business day.  For prescription refill requests, have your pharmacy contact our office.       Resources For Cancer Patients and their Caregivers ? American Cancer Society: Can assist with transportation, wigs, general needs, runs Look Good Feel Better.        ([phone] ? Cancer Care: Provides financial assistance, online support groups, medication/co-pay assistance.  1-800-813-HOPE 401 442 9093) ? [place] Assists [place] cancer patients and their families through emotional , educational and financial support.  331-587-9814 ? [place] DSS Where to apply for food stamps, Medicaid and utility assistance. 470-857-5319 ? RCATS: Transportation to medical appointments. 249-208-5477 ? Social Security Administration: May apply for disability if have a Stage IV cancer. [phone] ? [employer], Disability and Transit Services: Assists with nutrition, care and transit needs. [place] Support Programs: '@10RELATIVEDAYS'$ @ > Cancer Support Group  2nd Tuesday of the month 1pm-2pm, Journey Room  > Creative Journey  3rd Tuesday of the month 1130am-1pm, Journey Room  > Look Good Feel Better  1st Wednesday of the month 10am-12 noon, Journey Room (Call [place] to register [phone])

## 2016-05-11 LAB — CEA: CEA: 3.5 ng/mL (ref 0.0–4.7)

## 2016-06-01 DIAGNOSIS — H35033 Hypertensive retinopathy, bilateral: Secondary | ICD-10-CM | POA: Diagnosis not present

## 2016-06-01 DIAGNOSIS — E113293 Type 2 diabetes mellitus with mild nonproliferative diabetic retinopathy without macular edema, bilateral: Secondary | ICD-10-CM | POA: Diagnosis not present

## 2016-06-01 DIAGNOSIS — Z961 Presence of intraocular lens: Secondary | ICD-10-CM | POA: Diagnosis not present

## 2016-06-01 DIAGNOSIS — I10 Essential (primary) hypertension: Secondary | ICD-10-CM | POA: Diagnosis not present

## 2016-06-23 ENCOUNTER — Encounter (HOSPITAL_COMMUNITY): Payer: Medicare Other | Attending: Oncology

## 2016-06-23 ENCOUNTER — Encounter (HOSPITAL_COMMUNITY): Payer: Self-pay

## 2016-06-23 DIAGNOSIS — C189 Malignant neoplasm of colon, unspecified: Secondary | ICD-10-CM

## 2016-06-23 DIAGNOSIS — R911 Solitary pulmonary nodule: Secondary | ICD-10-CM | POA: Insufficient documentation

## 2016-06-23 DIAGNOSIS — Z452 Encounter for adjustment and management of vascular access device: Secondary | ICD-10-CM

## 2016-06-23 DIAGNOSIS — R938 Abnormal findings on diagnostic imaging of other specified body structures: Secondary | ICD-10-CM | POA: Insufficient documentation

## 2016-06-23 MED ORDER — SODIUM CHLORIDE 0.9% FLUSH
10.0000 mL | INTRAVENOUS | Status: DC | PRN
  Administered 2016-06-23: 10 mL via INTRAVENOUS
  Filled 2016-06-23: qty 10

## 2016-06-23 MED ORDER — HEPARIN SOD (PORK) LOCK FLUSH 100 UNIT/ML IV SOLN
500.0000 [IU] | Freq: Once | INTRAVENOUS | Status: AC
  Administered 2016-06-23: 500 [IU] via INTRAVENOUS
  Filled 2016-06-23: qty 5

## 2016-06-23 NOTE — Progress Notes (Signed)
Gary Nelson presented for Portacath access and flush. Portacath located right chest wall accessed with  H 20 needle. No blood return and no resistance met when flushed. Portacath flushed with 12m NS and 500U/515mHeparin and needle removed intact. Procedure without incident. Patient tolerated procedure well. Vitals stable and discharged home from clinic via wheelchair. Follow up as scheduled.

## 2016-06-23 NOTE — Patient Instructions (Signed)
[place] at [facility] Discharge Instructions  RECOMMENDATIONS MADE BY THE CONSULTANT AND ANY TEST RESULTS WILL BE SENT TO YOUR REFERRING PHYSICIAN.  Port flush done  Follow up as scheduled  Thank you for choosing [place] at [facility] to provide your oncology and hematology care.  To afford each patient quality time with our provider, please arrive at least 15 minutes before your scheduled appointment time.    If you have a lab appointment with the [place] please come in thru the  Main Entrance and check in at the main information desk  You need to re-schedule your appointment should you arrive 10 or more minutes late.  We strive to give you quality time with our providers, and arriving late affects you and other patients whose appointments are after yours.  Also, if you no show three or more times for appointments you may be dismissed from the clinic at the providers discretion.     Again, thank you for choosing [facility].  Our hope is that these requests will decrease the amount of time that you wait before being seen by our physicians.       _____________________________________________________________  Should you have questions after your visit to [facility], please contact our office at [phone] between the hours of 8:30 a.m. and 4:30 p.m.  Voicemails left after 4:30 p.m. will not be returned until the following business day.  For prescription refill requests, have your pharmacy contact our office.       Resources For Cancer Patients and their Caregivers ? American Cancer Society: Can assist with transportation, wigs, general needs, runs Look Good Feel Better.        [phone] ? Cancer Care: Provides financial assistance, online support groups, medication/co-pay assistance.  1-800-813-HOPE 308 838 2932) ? [place] Assists [place] cancer patients and their  families through emotional , educational and financial support.  [phone] ? [place] DSS Where to apply for food stamps, Medicaid and utility assistance. (458)047-4524 ? RCATS: Transportation to medical appointments. (315) 674-5670 ? Social Security Administration: May apply for disability if have a Stage IV cancer. ([phone]) 449-2472 308-545-7028 ? [employer], Disability and Transit Services: Assists with nutrition, care and transit needs. [place] Support Programs: '@10RELATIVEDAYS'$ @ > Cancer Support Group  2nd Tuesday of the month 1pm-2pm, Journey Room  > Creative Journey  3rd Tuesday of the month 1130am-1pm, Journey Room  > Look Good Feel Better  1st Wednesday of the month 10am-12 noon, Journey Room (Call [place] to register [phone])

## 2016-06-24 IMAGING — PT NM PET TUM IMG INITIAL (PI) SKULL BASE T - THIGH
1 of 8 series · 1 of 25 positions shown · non-contrast
Comparison: 06/23/2015

CLINICAL DATA: Subsequent Treatment strategy for colon cancer.

EXAM:
NUCLEAR MEDICINE PET SKULL BASE TO THIGH
TECHNIQUE: 13.9 mCi F-18 FDG was injected intravenously. Full-ring PET imaging
was performed from the skull base to thigh after the radiotracer. CT
data was obtained and used for attenuation correction and anatomic
localization.
FASTING BLOOD GLUCOSE:  Value: 131 mg/dl

[Series 4: ct sk_thigh 5.0 hd_fov · axial · 5.0mm · 1.17mm/px · 1 of 239 slices shown]
[im 239/239  brain]
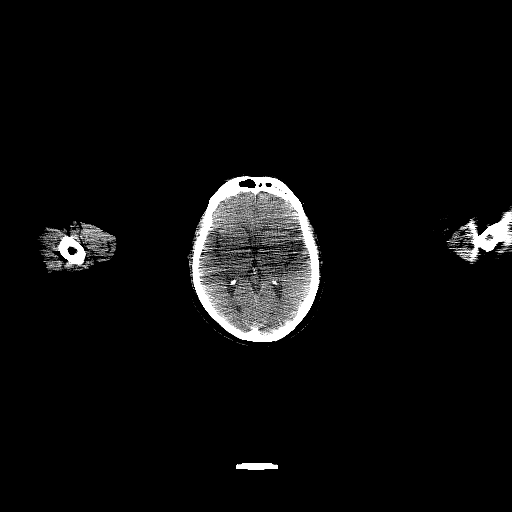

[1 of 25 positions shown; findings below may reference images not displayed]

FINDINGS: NECK

No hypermetabolic lymph nodes in the neck.

CHEST

The heart size is normal. There is aortic atherosclerosis noted.
Calcification within the RCA, LAD and left circumflex coronary
arteries noted. There is no pleural effusion identified. There is a
nodule within the posterior and medial right lower lobe which
measures 6 mm. SUV max equals 5.23. The subpleural nodule referenced
on previous CT is not visualized on today's examination.

ABDOMEN/PELVIS

No abnormal hypermetabolic activity within the liver, pancreas,
adrenal glands, or spleen. No hypermetabolic lymph nodes in the
abdomen or pelvis.

SKELETON

There is a indeterminate focus of increased uptake localizing to the
left femoral neck. SUV max equals 7.24. On the corresponding CT
images no definitive lytic or sclerotic bone lesion identified.
IMPRESSION: 1. There is malignant range FDG uptake associated with the right
lower lobe pulmonary nodule suspicious for either metastatic disease
or primary pulmonary neoplasm.
2. Nonspecific focus of increased uptake localizing to the left
femoral neck. No corresponding CT abnormality. Cannot rule out bone
metastasis. If further imaging is clinically indicated then a MRI
with contrast of the left hip may be helpful for further assessment.
3. Aortic atherosclerosis
4. Multi vessel coronary artery calcification.

## 2016-06-30 ENCOUNTER — Ambulatory Visit (HOSPITAL_COMMUNITY)
Admission: RE | Admit: 2016-06-30 | Discharge: 2016-06-30 | Disposition: A | Discharge status: Home / Self Care | Payer: Medicare Other | Source: Ambulatory Visit | Attending: Oncology | Admitting: Oncology

## 2016-06-30 DIAGNOSIS — R911 Solitary pulmonary nodule: Secondary | ICD-10-CM | POA: Insufficient documentation

## 2016-06-30 DIAGNOSIS — C7801 Secondary malignant neoplasm of right lung: Secondary | ICD-10-CM | POA: Diagnosis not present

## 2016-06-30 DIAGNOSIS — Z85038 Personal history of other malignant neoplasm of large intestine: Secondary | ICD-10-CM | POA: Diagnosis not present

## 2016-06-30 DIAGNOSIS — I2510 Atherosclerotic heart disease of native coronary artery without angina pectoris: Secondary | ICD-10-CM | POA: Insufficient documentation

## 2016-06-30 DIAGNOSIS — I700 Atherosclerosis of aorta: Secondary | ICD-10-CM | POA: Diagnosis not present

## 2016-06-30 DIAGNOSIS — Z9049 Acquired absence of other specified parts of digestive tract: Secondary | ICD-10-CM | POA: Diagnosis not present

## 2016-06-30 DIAGNOSIS — C189 Malignant neoplasm of colon, unspecified: Secondary | ICD-10-CM | POA: Insufficient documentation

## 2016-06-30 LAB — POCT I-STAT CREATININE: Creatinine, Ser: 1.1 mg/dL (ref 0.61–1.24)

## 2016-06-30 MED ORDER — IOPAMIDOL (ISOVUE-300) INJECTION 61%
100.0000 mL | Freq: Once | INTRAVENOUS | Status: AC | PRN
  Administered 2016-06-30: 100 mL via INTRAVENOUS

## 2016-08-16 ENCOUNTER — Encounter (HOSPITAL_COMMUNITY): Payer: Medicare Other | Attending: Oncology | Admitting: Oncology

## 2016-08-16 ENCOUNTER — Encounter (HOSPITAL_COMMUNITY): Payer: Self-pay

## 2016-08-16 ENCOUNTER — Ambulatory Visit (HOSPITAL_COMMUNITY): Payer: Medicare Other | Admitting: Adult Health

## 2016-08-16 ENCOUNTER — Encounter (HOSPITAL_COMMUNITY): Payer: Medicare Other

## 2016-08-16 VITALS — BP 146/51 | Temp 98.0°F | Resp 20 | Ht 65.0 in | Wt 275.0 lb

## 2016-08-16 DIAGNOSIS — C7801 Secondary malignant neoplasm of right lung: Secondary | ICD-10-CM

## 2016-08-16 DIAGNOSIS — R911 Solitary pulmonary nodule: Secondary | ICD-10-CM | POA: Insufficient documentation

## 2016-08-16 DIAGNOSIS — C189 Malignant neoplasm of colon, unspecified: Secondary | ICD-10-CM | POA: Diagnosis not present

## 2016-08-16 DIAGNOSIS — R938 Abnormal findings on diagnostic imaging of other specified body structures: Secondary | ICD-10-CM | POA: Diagnosis not present

## 2016-08-16 DIAGNOSIS — C7800 Secondary malignant neoplasm of unspecified lung: Secondary | ICD-10-CM

## 2016-08-16 LAB — CBC WITH DIFFERENTIAL/PLATELET
Basophils Absolute: 0 10*3/uL (ref 0.0–0.1)
Basophils Relative: 1 %
Eosinophils Absolute: 0.2 10*3/uL (ref 0.0–0.7)
Eosinophils Relative: 2 %
HCT: 36.7 % — ABNORMAL LOW (ref 39.0–52.0)
Hemoglobin: 12.3 g/dL — ABNORMAL LOW (ref 13.0–17.0)
Lymphocytes Relative: 17 %
Lymphs Abs: 1.4 10*3/uL (ref 0.7–4.0)
MCH: 31.9 pg (ref 26.0–34.0)
MCHC: 33.5 g/dL (ref 30.0–36.0)
MCV: 95.1 fL (ref 78.0–100.0)
Monocytes Absolute: 1 10*3/uL (ref 0.1–1.0)
Monocytes Relative: 12 %
Neutro Abs: 5.4 10*3/uL (ref 1.7–7.7)
Neutrophils Relative %: 68 %
Platelets: 260 10*3/uL (ref 150–400)
RBC: 3.86 MIL/uL — ABNORMAL LOW (ref 4.22–5.81)
RDW: 14 % (ref 11.5–15.5)
WBC: 7.9 10*3/uL (ref 4.0–10.5)

## 2016-08-16 LAB — COMPREHENSIVE METABOLIC PANEL
ALT: 14 U/L — ABNORMAL LOW (ref 17–63)
AST: 20 U/L (ref 15–41)
Albumin: 3.5 g/dL (ref 3.5–5.0)
Alkaline Phosphatase: 55 U/L (ref 38–126)
Anion gap: 5 (ref 5–15)
BUN: 26 mg/dL — ABNORMAL HIGH (ref 6–20)
CO2: 26 mmol/L (ref 22–32)
Calcium: 9.3 mg/dL (ref 8.9–10.3)
Chloride: 105 mmol/L (ref 101–111)
Creatinine, Ser: 1.6 mg/dL — ABNORMAL HIGH (ref 0.61–1.24)
GFR calc Af Amer: 47 mL/min — ABNORMAL LOW (ref >60)
GFR calc non Af Amer: 41 mL/min — ABNORMAL LOW (ref >60)
Glucose, Bld: 102 mg/dL — ABNORMAL HIGH (ref 65–99)
Potassium: 5.3 mmol/L — ABNORMAL HIGH (ref 3.5–5.1)
Sodium: 136 mmol/L (ref 135–145)
Total Bilirubin: 0.6 mg/dL (ref 0.3–1.2)
Total Protein: 7 g/dL (ref 6.5–8.1)

## 2016-08-16 NOTE — Progress Notes (Signed)
Gary Nelson., MD [address] [place] [address]  Malignant neoplasm metastatic to lung, unspecified laterality [name]) - Plan: Comprehensive metabolic panel, CBC with Differential, CEA, CT Abdomen Pelvis W Contrast, CT Chest W Contrast  Adenocarcinoma of colon (Gary Nelson) - Plan: Comprehensive metabolic panel, CBC with Differential, CEA, CT Abdomen Pelvis W Contrast, CT Chest W Contrast  CURRENT THERAPY: Surveillance per NCCN guidelines.  INTERVAL HISTORY: Gary Nelson 76 y.o. male returns for followup of Stage IV adenocarcinoma with a positive biopsy for adenocarcinoma of colorectal primary of RLL pulmonary nodule with a history of Stage II colorectal adenocarcinoma having finished treatment with curative intent in 2014-2015 consisting of partial colectomy on 10/04/2012 by Gary Nelson followed by 6 months worth of adjuvant chemotherapy consisting of FOLFOX with a change in treatment to infusional 5-FU for progressive peripheral neuropathy to finish 6 months worth of treatment by Gary Nelson.  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    Adenocarcinoma of colon (Gary Nelson)   10/04/2012 Definitive Surgery    Partial collectomy by Gary Nelson      10/04/2012 Pathology Results    4.5 cm poorly differentiated adenocarcinoma, negative margins, 0/7 lymph nodes for metastatic disease.  Oncotype recurrence score of 26 (high)       Chemotherapy    FOLFOX       Adverse Reaction    Progressive peripheral neuropathy       Chemotherapy    Infusional 5 FU.  Completed 6 months worth of adjuvant therapy.      02/23/2014 Procedure    Colonoscopy by Gary Nelson.      07/01/2015 PET scan    There is malignant range uptake with RLL pulmonary nodule suspicious for metastatic disease or primary pulm neoplasm. Nonspecific focus of uptake in the L femoral neck. No corresponding CT abnormality. Cannot rule out metastasis.      07/06/2015  Imaging    MR C-spine- C5-6 there is a broad-based disc bulge w R paracentral disc protrusion deforming the spinal cord. Moderate R foraminal stenosis. C6-7 there is a central disc protrusion slightly eccentric towards the R and mildly deforming the ventral c-spine       09/16/2015 Procedure    RLL needle biopsy by IR.      09/17/2015 Pathology Results    Metastatic adenocarcinoma consistent with a primary colorectal adenocarcinoma.      10/12/2015 - 10/22/2015 Radiation Therapy    SBRT by Gary Nelson to RLL pulmonary lesion (biopsy proven to be oligometastasis).      12/09/2015 Imaging    CT chest- 1. Slight interval decrease in size of the right lower lobe pulmonary nodule. Some surrounding radiation changes. 2. No mediastinal or hilar mass or adenopathy. 3. No new pulmonary nodules. 4. Stable underline emphysematous changes. 5. Cirrhotic changes involving the liver but no upper abdominal metastatic disease.      03/14/2016 Imaging    CT chest- 1. Continued mild reduction in the size of the treated right lobe pulmonary nodule. 2. Increased patchy consolidation, reticulation and ground-glass attenuation in the basilar right lower lobe, nonspecific, favor evolving postradiation change, recommend attention on follow-up chest CT. 3. No evidence of new or progressive metastatic disease in the chest. 4. Aortic atherosclerosis. Left main and 3 vessel coronary atherosclerosis. 5. Mild emphysema.      06/30/2016 Imaging    CT CAP- 1. The appearance of the treated right lower  lobe nodule is essentially unchanged, and there are evolving postradiation changes in the base of the right lower lobe, as above. No definite signs of new metastatic disease are noted elsewhere in the chest, abdomen or pelvis. 2. Aortic atherosclerosis, in addition to left main and 3 vessel coronary artery disease. Assessment for potential risk factor modification, dietary therapy or pharmacologic therapy may  be warranted, if clinically indicated. 3. Morphologic changes in the liver suggestive of cirrhosis, as above. 4. Additional incidental findings, similar prior studies, as above.      Patient presents today for continued follow-up for his colon cancer. He states that he has been doing relatively well since last visit. He continues to have chronic shortness of breath and uses oxygen at home. 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 He denies any melena, hematochezia, or changes in bowel movements.   Review of Systems  Constitutional: Negative.  Negative for chills, fever and weight loss.  HENT: Negative.   Eyes: Negative.   Respiratory: Negative.  Negative for cough.        Chronic Shortness of Breath especially leaning forward  Cardiovascular: Negative.  Negative for chest pain.  Gastrointestinal: Negative.  Negative for blood in stool, constipation, diarrhea, melena, nausea and vomiting.  Genitourinary: Negative.   Musculoskeletal: Positive for back pain (chronic, stable).  Skin: Negative.   Neurological: Negative.  Negative for weakness.  Endo/Heme/Allergies: Negative.   Psychiatric/Behavioral: Negative.     Past Medical History:  Diagnosis Date  . Adenocarcinoma of colon ([name])    RLL -  Pulmonary nodule (06/2015, concerning for mets vs primary; to see RAD-ONC Gary Nelson); stage 2 adenocarinoma   2014 - Colon Cancer - surgery and chemo  . Blood infection ([name])   . Cystitis   . Diabetes mellitus without complication ([name])    Type II  . DVT (deep venous thrombosis) ([name]) 2014   post op  . Hematuria   . Hypertension   . Neuropathy   . Prostatitis   . Pulmonary nodule   . Sleep apnea     Past Surgical History:  Procedure Laterality Date  . BACK SURGERY    . CATARACT EXTRACTION     left  . CATARACT EXTRACTION W/PHACO Right 04/11/2012   Procedure: CATARACT EXTRACTION PHACO AND INTRAOCULAR LENS PLACEMENT (IOC);  Surgeon: Tonny Branch, MD;  Location: [facility];  Service:  Ophthalmology;  Laterality: Right;  CDE:62.48  . CERVICAL FUSION     c4-c5  . CHOLECYSTECTOMY    . COLON SURGERY  09/29/12   Colon resection for cancer  . COLONOSCOPY    . EYE SURGERY     cataract  . GIVENS CAPSULE STUDY N/A 03/15/2015   Procedure: GIVENS CAPSULE STUDY;  Surgeon: Rogene Houston, MD;  Location: [facility] SUITE;  Service: Endoscopy;  Laterality: N/A;  730  . LEG SURGERY    . ORIF FEMUR FRACTURE     right  . POSTERIOR CERVICAL FUSION/FORAMINOTOMY N/A 07/27/2015   Procedure: Cervical four to Cervical seven Laminectomy with Cervical four to Thoracic one Dorsal internal fixation and fusion;  Surgeon: Kevan Ny Ditty, MD;  Location: [facility];  Service: Neurosurgery;  Laterality: N/A;  C4 to C7 Laminectomy with C4 to T1 Dorsal internal fixation and fusion    Family History  Problem Relation Age of Onset  . Heart disease Father   . Heart attack Father   . Alzheimer's disease Mother     Social History   Social History  . Marital  status: Single    Spouse name: N/A  . Number of children: N/A  . Years of education: N/A   Social History Main Topics  . Smoking status: Former Smoker    Packs/day: 1.00    Years: 55.00    Types: Cigarettes    Start date: 02/07/1955  . Smokeless tobacco: Never Used     Comment: quit 2015  . Alcohol use 1.8 oz/week    3 Cans of beer per week  . Drug use: No  . Sexual activity: Yes    Birth control/ protection: None   Other Topics Concern  . None   Social History Narrative  . None     PHYSICAL EXAMINATION  ECOG PERFORMANCE STATUS: 2 - Symptomatic, <50% confined to bed  Vitals:   08/16/16 1356  BP: (!) 146/51  Resp: 20  Temp: 98 F (36.7 C)    GENERAL:alert, well nourished, well developed, comfortable, cooperative, obese, smiling and in wheelchair, accompanied by brother's step-son. SKIN: skin color, texture, turgor are normal, no rashes or significant lesions HEAD: Normocephalic, No masses, lesions, tenderness or  abnormalities EYES: normal, EOMI, Conjunctiva are pink and non-injected EARS: External ears normal OROPHARYNX:lips, buccal mucosa, and tongue normal and mucous membranes are moist  NECK: supple, no adenopathy LYMPH:  no palpable lymphadenopathy BREAST:not examined LUNGS: clear to auscultation and percussion without wheezes, rales or rhonchi. HEART: regular rate & rhythm, no murmurs, no gallops, S1 normal and S2 normal.  Port a cath in place in right chest wall. ABDOMEN:abdomen soft, non-tender, obese and normal bowel sounds BACK: No CVA tenderness EXTREMITIES:less then 2 second capillary refill, no joint deformities, effusion, or inflammation, no skin discoloration, no cyanosis  NEURO: alert & oriented x 3 with fluent speech, no focal motor/sensory deficits in wheelchair.   LABORATORY DATA: CBC    Component Value Date/Time   WBC 8.7 05/10/2016 1400   RBC 4.01 (L) 05/10/2016 1400   HGB 12.7 (L) 05/10/2016 1400   HCT 37.3 (L) 05/10/2016 1400   PLT 290 05/10/2016 1400   MCV 93.0 05/10/2016 1400   MCH 31.7 05/10/2016 1400   MCHC 34.0 05/10/2016 1400   RDW 13.7 05/10/2016 1400   LYMPHSABS 1.7 05/10/2016 1400   MONOABS 0.7 05/10/2016 1400   EOSABS 0.1 05/10/2016 1400   BASOSABS 0.0 05/10/2016 1400      Chemistry      Component Value Date/Time   NA 135 05/10/2016 1400   K 4.9 05/10/2016 1400   CL 102 05/10/2016 1400   CO2 24 05/10/2016 1400   BUN 35 (H) 05/10/2016 1400   CREATININE 1.10 06/30/2016 1132      Component Value Date/Time   CALCIUM 9.1 05/10/2016 1400   ALKPHOS 69 05/10/2016 1400   AST 16 05/10/2016 1400   ALT 15 (L) 05/10/2016 1400   BILITOT 0.5 05/10/2016 1400     Lab Results  Component Value Date   CEA 3.5 05/10/2016     PENDING LABS:   RADIOGRAPHIC STUDIES:  No results found.   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  Clinically NED. Labs today and on next visit with CBC, CMP, CEA.  CT chest abdomen and pelvis with contrast 1-2 days prior to next visit for continued surveillance.  Return to clinic in 3 months for follow-up.  ORDERS PLACED FOR THIS ENCOUNTER: Orders Placed This Encounter  Procedures  . CT Abdomen Pelvis W Contrast  . CT Chest W Contrast  . Comprehensive metabolic panel  . CBC with Differential  . CEA     THERAPY PLAN:  NCCN guidelines for surveillance for Colon cancer are as follows (1.2017):  A. Stage I   1. Colonoscopy at year 1    A. If advanced adenoma, repeat in 1 year    B. If no advanced adenoma, repeat in 3 years, and then every 5 years.  B. Stage II, Stage III   1. H+P every 3-6 months x 2 years and then every 6 months for a total of 5 years    2. CEA every 3-6 months x 2 years and then every 6 months for a total of 5 years    3. CT CAP every 6-12 months (category 2B for frequency < 12 months) for a total of 5 years .   4.  Colonoscopy in 1 year except if no preoperative colonoscopy due to obstructing lesion, colonoscopy in 3-6 months.     A. If advanced adenoma, repeat in 1 year    B. If no advanced adenoma, repeat in 3 years, then every 5 years   5. PET/CT scan is not recommended.  C. Stage IV   1. H+P every 3-6 months x 2 years and then every 6 months for a total of 5 years    2. CEA every 3 months x 2 years and then every 6 months for a total of 3- 5 years    3. CT CAP every 3-6 months  (category 2B for frequency < 6 months) x 2 years., then every 6-12 months for a total of 5 years .   4. Colonoscopy in 1 year except if no preoperative colonoscopy due to obstructing lesion, colonoscopy in 3-6 months.     A. If advanced adenoma, repeat in 1 year    B. If no advanced adenoma, repeat in 3 years, then every 5 years   All questions were answered. The patient knows to call the clinic with any problems, questions or concerns. We can certainly see the patient much sooner if necessary.  Patient and plan discussed with Dr. Twana First and she is in agreement with the aforementioned.   This note is electronically signed by: Twana First, MD 08/16/2016 2:21 PM

## 2016-08-17 LAB — CEA: CEA: 4.7 ng/mL (ref 0.0–4.7)

## 2016-08-25 ENCOUNTER — Encounter (HOSPITAL_BASED_OUTPATIENT_CLINIC_OR_DEPARTMENT_OTHER): Payer: Medicare Other

## 2016-08-25 ENCOUNTER — Encounter (HOSPITAL_COMMUNITY): Payer: Self-pay

## 2016-08-25 VITALS — BP 120/43 | HR 94 | Temp 97.5°F | Resp 18

## 2016-08-25 DIAGNOSIS — C189 Malignant neoplasm of colon, unspecified: Secondary | ICD-10-CM | POA: Diagnosis not present

## 2016-08-25 DIAGNOSIS — Z452 Encounter for adjustment and management of vascular access device: Secondary | ICD-10-CM

## 2016-08-25 DIAGNOSIS — Z95828 Presence of other vascular implants and grafts: Secondary | ICD-10-CM

## 2016-08-25 MED ORDER — HEPARIN SOD (PORK) LOCK FLUSH 100 UNIT/ML IV SOLN
500.0000 [IU] | Freq: Once | INTRAVENOUS | Status: AC
  Administered 2016-08-25: 500 [IU] via INTRAVENOUS

## 2016-08-25 MED ORDER — SODIUM CHLORIDE 0.9% FLUSH
10.0000 mL | INTRAVENOUS | Status: DC | PRN
  Administered 2016-08-25: 10 mL via INTRAVENOUS
  Filled 2016-08-25: qty 10

## 2016-08-25 NOTE — Progress Notes (Signed)
Gary Nelson tolerated portacath flush well without complaints or incident. Port accessed with 20 gauge needle with no blood return noted but flushed easily with 10 ml NS and 5 ml Heparin per protocol without complaints or discomfort or noted swelling then de-accessed. VSS Pt discharged via wheelchair in satisfactory condition with family

## 2016-08-25 NOTE — Patient Instructions (Signed)
[facility] at [facility] Discharge Instructions  RECOMMENDATIONS MADE BY THE CONSULTANT AND ANY TEST RESULTS WILL BE SENT TO YOUR REFERRING PHYSICIAN.  Portacath flushed per protocol today. Follow-up as scheduled. Call clinic for any questions or concerns  Thank you for choosing [facility] at [facility] to provide your oncology and hematology care.  To afford each patient quality time with our provider, please arrive at least 15 minutes before your scheduled appointment time.    If you have a lab appointment with the Cancer Center please come in thru the  Main Entrance and check in at the main information desk  You need to re-schedule your appointment should you arrive 10 or more minutes late.  We strive to give you quality time with our providers, and arriving late affects you and other patients whose appointments are after yours.  Also, if you no show three or more times for appointments you may be dismissed from the clinic at the providers discretion.     Again, thank you for choosing [facility].  Our hope is that these requests will decrease the amount of time that you wait before being seen by our physicians.       _____________________________________________________________  Should you have questions after your visit to [facility], please contact our office at [phone] between the hours of 8:30 a.m. and 4:30 p.m.  Voicemails left after 4:30 p.m. will not be returned until the following business day.  For prescription refill requests, have your pharmacy contact our office.       Resources For Cancer Patients and their Caregivers ? American Cancer Society: Can assist with transportation, wigs, general needs, runs Look Good Feel Better.        1-888-227-6333 ? Cancer Care: Provides financial assistance, online support groups, medication/co-pay assistance.  1-800-813-HOPE (4673) ? [facility] Assists [place] cancer patients and their families through emotional , educational and financial support.  336-427-4357 ? [place] DSS Where to apply for food stamps, Medicaid and utility assistance. 336-342-1394 ? RC[facility]: Transportation to medical appointments. 336-347-2287 ? Social Security Administration: May apply for disability if have a Stage IV cancer. 336-342-7796 1-800-772-1213 ? [place] Aging, Disability and Transit Services: Assists with nutrition, care and transit needs. 336-349-2343  Cancer Center Support Programs: @10RELATIVEDAYS@ > Cancer Support Group  2nd Tuesday of the month 1pm-2pm, Journey Room  > Creative Journey  3rd Tuesday of the month 1130am-1pm, Journey Room  > Look Good Feel Better  1st Wednesday of the month 10am-12 noon, Journey Room (Call American Cancer [employer] to register 1-800-395-5775)   

## 2016-08-31 DIAGNOSIS — E119 Type 2 diabetes mellitus without complications: Secondary | ICD-10-CM | POA: Diagnosis not present

## 2016-08-31 DIAGNOSIS — R1312 Dysphagia, oropharyngeal phase: Secondary | ICD-10-CM | POA: Diagnosis not present

## 2016-08-31 DIAGNOSIS — I10 Essential (primary) hypertension: Secondary | ICD-10-CM | POA: Diagnosis not present

## 2016-10-16 ENCOUNTER — Ambulatory Visit (INDEPENDENT_AMBULATORY_CARE_PROVIDER_SITE_OTHER): Payer: Medicare Other | Admitting: Otolaryngology

## 2016-10-16 DIAGNOSIS — R1312 Dysphagia, oropharyngeal phase: Secondary | ICD-10-CM | POA: Diagnosis not present

## 2016-10-16 DIAGNOSIS — K219 Gastro-esophageal reflux disease without esophagitis: Secondary | ICD-10-CM

## 2016-10-17 ENCOUNTER — Other Ambulatory Visit (INDEPENDENT_AMBULATORY_CARE_PROVIDER_SITE_OTHER): Payer: Self-pay | Admitting: Otolaryngology

## 2016-10-17 DIAGNOSIS — R1310 Dysphagia, unspecified: Secondary | ICD-10-CM

## 2016-10-20 ENCOUNTER — Encounter (HOSPITAL_COMMUNITY): Payer: Medicare Other

## 2016-10-23 ENCOUNTER — Encounter (HOSPITAL_COMMUNITY): Payer: Self-pay

## 2016-10-23 ENCOUNTER — Encounter (HOSPITAL_COMMUNITY): Payer: Medicare Other | Attending: Oncology

## 2016-10-23 VITALS — BP 117/43 | HR 92 | Temp 98.4°F | Resp 18

## 2016-10-23 DIAGNOSIS — Z452 Encounter for adjustment and management of vascular access device: Secondary | ICD-10-CM | POA: Diagnosis not present

## 2016-10-23 DIAGNOSIS — R911 Solitary pulmonary nodule: Secondary | ICD-10-CM | POA: Insufficient documentation

## 2016-10-23 DIAGNOSIS — C189 Malignant neoplasm of colon, unspecified: Secondary | ICD-10-CM | POA: Insufficient documentation

## 2016-10-23 DIAGNOSIS — R938 Abnormal findings on diagnostic imaging of other specified body structures: Secondary | ICD-10-CM | POA: Insufficient documentation

## 2016-10-23 DIAGNOSIS — Z95828 Presence of other vascular implants and grafts: Secondary | ICD-10-CM

## 2016-10-23 MED ORDER — HEPARIN SOD (PORK) LOCK FLUSH 100 UNIT/ML IV SOLN
500.0000 [IU] | Freq: Once | INTRAVENOUS | Status: AC
  Administered 2016-10-23: 500 [IU] via INTRAVENOUS
  Filled 2016-10-23: qty 5

## 2016-10-23 MED ORDER — SODIUM CHLORIDE 0.9% FLUSH
10.0000 mL | INTRAVENOUS | Status: DC | PRN
  Administered 2016-10-23: 10 mL via INTRAVENOUS
  Filled 2016-10-23: qty 10

## 2016-10-23 NOTE — Patient Instructions (Signed)
[facility] at [facility] Discharge Instructions  RECOMMENDATIONS MADE BY THE CONSULTANT AND ANY TEST RESULTS WILL BE SENT TO YOUR REFERRING PHYSICIAN.  You had your port flushed today. Continue to get it flushed every 6-8 weeks Follow up as scheduled.  Thank you for choosing [place] at [facility] to provide your oncology and hematology care.  To afford each patient quality time with our provider, please arrive at least 15 minutes before your scheduled appointment time.    If you have a lab appointment with the [facility] please come in thru the  Main Entrance and check in at the main information desk  You need to re-schedule your appointment should you arrive 10 or more minutes late.  We strive to give you quality time with our providers, and arriving late affects you and other patients whose appointments are after yours.  Also, if you no show three or more times for appointments you may be dismissed from the clinic at the providers discretion.     Again, thank you for choosing [facility].  Our hope is that these requests will decrease the amount of time that you wait before being seen by our physicians.       _____________________________________________________________  Should you have questions after your visit to [facility], please contact our office at [phone] between the hours of 8:30 a.m. and 4:30 p.m.  Voicemails left after 4:30 p.m. will not be returned until the following business day.  For prescription refill requests, have your pharmacy contact our office.       Resources For Cancer Patients and their Caregivers ? American Cancer Society: Can assist with transportation, wigs, general needs, runs Look Good Feel Better.        430-762-7639 ? Cancer Care: Provides financial assistance, online support groups, medication/co-pay assistance.  1-800-813-HOPE 5875933317) ? [facility] Assists [place] cancer patients and their families through emotional , educational and financial support.  (717)131-4510 ? [place] DSS Where to apply for food stamps, Medicaid and utility assistance. (434)730-4229 ? RCATS: Transportation to medical appointments. 817-150-1426 ? Social Security Administration: May apply for disability if have a Stage IV cancer. 442-580-4551 (732)311-3752 ? [employer], Disability and Transit Services: Assists with nutrition, care and transit needs. [place] Support Programs: @10RELATIVEDAYS @ > Cancer Support Group  2nd Tuesday of the month 1pm-2pm, Journey Room  > Creative Journey  3rd Tuesday of the month 1130am-1pm, Journey Room  > Look Good Feel Better  1st Wednesday of the month 10am-12 noon, Journey Room (Call [place] to register [phone])

## 2016-10-23 NOTE — Progress Notes (Signed)
[name] presented for Portacath access and flush.  Portacath located right chest wall accessed with  H 20 needle.  No blood return and flushes without difficulty. Portacath flushed with 13ml NS and 500U/53ml Heparin and needle removed intact.  Procedure tolerated well and without incident.   Patient discharged in stable condition via wheelchair with son. Patient to follow up as scheduled.

## 2016-10-26 ENCOUNTER — Ambulatory Visit (HOSPITAL_COMMUNITY)
Admission: RE | Admit: 2016-10-26 | Discharge: 2016-10-26 | Disposition: A | Discharge status: Home / Self Care | Payer: Medicare Other | Source: Ambulatory Visit | Attending: Otolaryngology | Admitting: Otolaryngology

## 2016-10-26 DIAGNOSIS — K449 Diaphragmatic hernia without obstruction or gangrene: Secondary | ICD-10-CM | POA: Diagnosis not present

## 2016-10-26 DIAGNOSIS — R1310 Dysphagia, unspecified: Secondary | ICD-10-CM | POA: Diagnosis not present

## 2016-11-13 ENCOUNTER — Ambulatory Visit (INDEPENDENT_AMBULATORY_CARE_PROVIDER_SITE_OTHER): Payer: Medicare Other | Admitting: Otolaryngology

## 2016-11-13 DIAGNOSIS — K219 Gastro-esophageal reflux disease without esophagitis: Secondary | ICD-10-CM | POA: Diagnosis not present

## 2016-11-13 DIAGNOSIS — R070 Pain in throat: Secondary | ICD-10-CM | POA: Diagnosis not present

## 2016-11-13 DIAGNOSIS — R1312 Dysphagia, oropharyngeal phase: Secondary | ICD-10-CM

## 2016-11-14 ENCOUNTER — Encounter (HOSPITAL_COMMUNITY): Payer: Medicare Other | Attending: Oncology

## 2016-11-14 DIAGNOSIS — C189 Malignant neoplasm of colon, unspecified: Secondary | ICD-10-CM

## 2016-11-14 DIAGNOSIS — C7800 Secondary malignant neoplasm of unspecified lung: Secondary | ICD-10-CM

## 2016-11-14 LAB — COMPREHENSIVE METABOLIC PANEL
ALT: 17 U/L (ref 17–63)
AST: 22 U/L (ref 15–41)
Albumin: 3.5 g/dL (ref 3.5–5.0)
Alkaline Phosphatase: 57 U/L (ref 38–126)
Anion gap: 7 (ref 5–15)
BUN: 21 mg/dL — ABNORMAL HIGH (ref 6–20)
CO2: 24 mmol/L (ref 22–32)
Calcium: 8.9 mg/dL (ref 8.9–10.3)
Chloride: 103 mmol/L (ref 101–111)
Creatinine, Ser: 1.15 mg/dL (ref 0.61–1.24)
GFR calc Af Amer: >60 mL/min (ref >60)
GFR calc non Af Amer: >60 mL/min (ref >60)
Glucose, Bld: 147 mg/dL — ABNORMAL HIGH (ref 65–99)
Potassium: 5.2 mmol/L — ABNORMAL HIGH (ref 3.5–5.1)
Sodium: 134 mmol/L — ABNORMAL LOW (ref 135–145)
Total Bilirubin: 0.6 mg/dL (ref 0.3–1.2)
Total Protein: 6.7 g/dL (ref 6.5–8.1)

## 2016-11-14 LAB — CBC WITH DIFFERENTIAL/PLATELET
Basophils Absolute: 0 10*3/uL (ref 0.0–0.1)
Basophils Relative: 0 %
Eosinophils Absolute: 0.1 10*3/uL (ref 0.0–0.7)
Eosinophils Relative: 2 %
HCT: 37.2 % — ABNORMAL LOW (ref 39.0–52.0)
Hemoglobin: 12.5 g/dL — ABNORMAL LOW (ref 13.0–17.0)
Lymphocytes Relative: 22 %
Lymphs Abs: 1.7 10*3/uL (ref 0.7–4.0)
MCH: 31.8 pg (ref 26.0–34.0)
MCHC: 33.6 g/dL (ref 30.0–36.0)
MCV: 94.7 fL (ref 78.0–100.0)
Monocytes Absolute: 0.7 10*3/uL (ref 0.1–1.0)
Monocytes Relative: 10 %
Neutro Abs: 5.1 10*3/uL (ref 1.7–7.7)
Neutrophils Relative %: 66 %
Platelets: 229 10*3/uL (ref 150–400)
RBC: 3.93 MIL/uL — ABNORMAL LOW (ref 4.22–5.81)
RDW: 13.4 % (ref 11.5–15.5)
WBC: 7.6 10*3/uL (ref 4.0–10.5)

## 2016-11-15 LAB — CEA: CEA: 4 ng/mL (ref 0.0–4.7)

## 2016-11-17 ENCOUNTER — Ambulatory Visit (HOSPITAL_COMMUNITY)
Admission: RE | Admit: 2016-11-17 | Discharge: 2016-11-17 | Disposition: A | Discharge status: Home / Self Care | Payer: Medicare Other | Source: Ambulatory Visit | Attending: Oncology | Admitting: Oncology

## 2016-11-17 DIAGNOSIS — J432 Centrilobular emphysema: Secondary | ICD-10-CM | POA: Diagnosis not present

## 2016-11-17 DIAGNOSIS — J438 Other emphysema: Secondary | ICD-10-CM | POA: Insufficient documentation

## 2016-11-17 DIAGNOSIS — C189 Malignant neoplasm of colon, unspecified: Secondary | ICD-10-CM

## 2016-11-17 DIAGNOSIS — I2510 Atherosclerotic heart disease of native coronary artery without angina pectoris: Secondary | ICD-10-CM | POA: Insufficient documentation

## 2016-11-17 DIAGNOSIS — C7800 Secondary malignant neoplasm of unspecified lung: Secondary | ICD-10-CM | POA: Diagnosis not present

## 2016-11-17 DIAGNOSIS — R918 Other nonspecific abnormal finding of lung field: Secondary | ICD-10-CM | POA: Insufficient documentation

## 2016-11-17 DIAGNOSIS — I700 Atherosclerosis of aorta: Secondary | ICD-10-CM | POA: Diagnosis not present

## 2016-11-17 DIAGNOSIS — C3490 Malignant neoplasm of unspecified part of unspecified bronchus or lung: Secondary | ICD-10-CM | POA: Diagnosis not present

## 2016-11-17 MED ORDER — HEPARIN SOD (PORK) LOCK FLUSH 100 UNIT/ML IV SOLN
INTRAVENOUS | Status: AC
  Administered 2016-11-17: 500 [IU]
  Filled 2016-11-17: qty 5

## 2016-11-17 MED ORDER — IOPAMIDOL (ISOVUE-300) INJECTION 61%
100.0000 mL | Freq: Once | INTRAVENOUS | Status: AC | PRN
  Administered 2016-11-17: 100 mL via INTRAVENOUS

## 2016-11-21 ENCOUNTER — Encounter (HOSPITAL_BASED_OUTPATIENT_CLINIC_OR_DEPARTMENT_OTHER): Payer: Medicare Other | Admitting: Oncology

## 2016-11-21 ENCOUNTER — Encounter (HOSPITAL_COMMUNITY): Payer: Self-pay | Admitting: Oncology

## 2016-11-21 VITALS — BP 116/32 | HR 96 | Resp 16

## 2016-11-21 DIAGNOSIS — C189 Malignant neoplasm of colon, unspecified: Secondary | ICD-10-CM

## 2016-11-21 DIAGNOSIS — G620 Drug-induced polyneuropathy: Secondary | ICD-10-CM | POA: Diagnosis not present

## 2016-11-21 DIAGNOSIS — C7801 Secondary malignant neoplasm of right lung: Secondary | ICD-10-CM | POA: Diagnosis not present

## 2016-11-21 DIAGNOSIS — C7800 Secondary malignant neoplasm of unspecified lung: Secondary | ICD-10-CM

## 2016-11-21 NOTE — Progress Notes (Signed)
[name], MD [address] [place] [address]  Adenocarcinoma of colon [facility]) - Plan: NM PET Image Restag (PS) Skull Base To Thigh  Malignant neoplasm metastatic to lung, unspecified laterality ([place]) - Plan: NM PET Image Restag (PS) Skull Base To Thigh  CURRENT THERAPY: Surveillance per NCCN guidelines.  INTERVAL HISTORY: Gary Nelson 76 y.o. 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 [name] followed by 6 months worth of adjuvant chemotherapy consisting of FOLFOX with a change in treatment to infusional 5-FU for progressive peripheral neuropathy to finish 6 months worth of treatment by Dr. [name].  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 [name].    Adenocarcinoma of colon ([place])   10/04/2012 Definitive Surgery    Partial collectomy by Dr. [name]      10/04/2012 Pathology Results    4.5 cm poorly differentiated adenocarcinoma, negative margins, 0/7 lymph nodes for metastatic disease.  Oncotype recurrence score of 26 (high)       Chemotherapy    FOLFOX       Adverse Reaction    Progressive peripheral neuropathy       Chemotherapy    Infusional 5 FU.  Completed 6 months worth of adjuvant therapy.      02/23/2014 Procedure    Colonoscopy by Dr. Anthony Sar.      07/01/2015 PET scan    There is malignant range uptake with RLL pulmonary nodule suspicious for metastatic disease or primary pulm neoplasm. Nonspecific focus of uptake in the L femoral neck. No corresponding CT abnormality. Cannot rule out metastasis.      07/06/2015 Imaging    MR C-spine- C5-6 there is a broad-based disc bulge w R paracentral disc protrusion deforming the spinal cord. Moderate R  foraminal stenosis. C6-7 there is a central disc protrusion slightly eccentric towards the R and mildly deforming the ventral c-spine       09/16/2015 Procedure    RLL needle biopsy by IR.      09/17/2015 Pathology Results    Metastatic adenocarcinoma consistent with a primary colorectal adenocarcinoma.      10/12/2015 - 10/22/2015 Radiation Therapy    SBRT by Dr. [name] to RLL pulmonary lesion (biopsy proven to be oligometastasis).      12/09/2015 Imaging    CT chest- 1. Slight interval decrease in size of the right lower lobe pulmonary nodule. Some surrounding radiation changes. 2. No mediastinal or hilar mass or adenopathy. 3. No new pulmonary nodules. 4. Stable underline emphysematous changes. 5. Cirrhotic changes involving the liver but no upper abdominal metastatic disease.      03/14/2016 Imaging    CT chest- 1. Continued mild reduction in the size of the treated right lobe pulmonary nodule. 2. Increased patchy consolidation, reticulation and ground-glass attenuation in the basilar right lower lobe, nonspecific, favor evolving postradiation change, recommend attention on follow-up chest CT. 3. No evidence of new or progressive metastatic disease in the chest. 4. Aortic atherosclerosis. Left main and 3 vessel coronary atherosclerosis. 5. Mild emphysema.      06/30/2016 Imaging    CT CAP- 1. The appearance of the treated right lower lobe nodule is essentially unchanged, and there are evolving postradiation changes in the base  of the right lower lobe, as above. No definite signs of new metastatic disease are noted elsewhere in the chest, abdomen or pelvis. 2. Aortic atherosclerosis, in addition to left main and 3 vessel coronary artery disease. Assessment for potential risk factor modification, dietary therapy or pharmacologic therapy may be warranted, if clinically indicated. 3. Morphologic changes in the liver suggestive of cirrhosis, as above. 4. Additional incidental  findings, similar prior studies, as above.      11/17/2016 Imaging    CT CAP Study Result   CLINICAL DATA:  Stage IV colon cancer originally diagnosed in August 2014 status post partial colectomy, with right lower lobe lung metastasis diagnosed August 2017 status post SBRT completed 10/22/2015. Restaging.  EXAM: CT CHEST, ABDOMEN, AND PELVIS WITH CONTRAST  TECHNIQUE: Multidetector CT imaging of the chest, abdomen and pelvis was performed following the standard protocol during bolus administration of intravenous contrast.  CONTRAST:  17mL ISOVUE-300 IOPAMIDOL (ISOVUE-300) INJECTION 61%  COMPARISON:  06/30/2016 CT chest, abdomen and pelvis.  FINDINGS: CT CHEST FINDINGS  Cardiovascular: Normal heart size. No significant pericardial fluid/thickening. Left main, left anterior descending, left circumflex and right coronary atherosclerosis. Atherosclerotic nonaneurysmal thoracic aorta. Normal caliber pulmonary arteries. No central pulmonary emboli. Right internal jugular MediPort terminates at the junction of the right and left brachiocephalic veins.  Mediastinum/Nodes: No discrete thyroid nodules. Unremarkable esophagus. No pathologically enlarged axillary, mediastinal or hilar lymph nodes.  Lungs/Pleura: No pneumothorax. No pleural effusion. There is a nodular 2.7 x 1.6 cm focus of consolidation in the medial basilar right lower lobe (series 3/ image 108), previously 1.6 x 1.1 cm. No appreciable change in patchy subpleural reticulation in both lungs without significant traction bronchiectasis or frank honeycombing. Mild centrilobular and paraseptal emphysema with mild diffuse bronchial wall thickening. No acute consolidative airspace disease or new significant pulmonary nodules.  Musculoskeletal: No aggressive appearing focal osseous lesions. Moderate thoracic spondylosis. Stable chronic left scapular deformity.  CT ABDOMEN PELVIS FINDINGS  Hepatobiliary:  Normal liver with no liver mass. Cholecystectomy. No biliary ductal dilatation.  Pancreas: Normal, with no mass or duct dilation.  Spleen: Normal size. No mass.  Adrenals/Urinary Tract: Normal adrenals. No hydronephrosis. No renal masses. Normal bladder.  Stomach/Bowel: Grossly normal stomach. Normal caliber small bowel with no small bowel wall thickening. Stable appearance of the appendix, within normal limits. Stable postsurgical changes from partial distal colectomy with intact appearing distal colonic anastomosis. Oral contrast transits to the cecum. No large bowel wall thickening or new pericholecystic fat stranding .  Vascular/Lymphatic: Atherosclerotic nonaneurysmal abdominal aorta. Patent portal, splenic, hepatic and renal veins. No pathologically enlarged lymph nodes in the abdomen or pelvis.  Reproductive:  Normal size prostate.  Other: No pneumoperitoneum, ascites or focal fluid collection. Stable subcutaneous calcified granulomas throughout the left lower back and gluteal regions.  Musculoskeletal: No aggressive appearing focal osseous lesions. Marked lumbar spondylosis.  IMPRESSION: 1. Nodular focus of consolidation in the medial basilar right lower lobe is increased in size. While this may represent evolving masslike radiation fibrosis, recurrence of the lung metastasis is not excluded. PET-CT would be useful for further evaluation. Otherwise, continued close chest CT surveillance is advised . 2. No additional potential new sites of metastatic disease in the chest, abdomen or pelvis. 3. No evidence of local tumor recurrence at the distal colonic anastomosis. 4. Left main and 3 vessel coronary atherosclerosis . 5. Aortic Atherosclerosis (ICD10-I70.0) and Emphysema (ICD10-J43.9).         Patient presents today for continued follow-up for his colon cancer.  He continues to have chronic fatigue. He continues to have chronic shortness of breath and uses  oxygen at home. Continues to have chronic hip pain. He denies any melena, hematochezia, or changes in bowel movements. Appetite is great and no weight loss. He has intermittent leg swelling. Bruises easily. Has some intermittent trouble swallowing solids. He has a hiatal hernia.   Review of Systems  Constitutional: Positive for malaise/fatigue. Negative for chills, fever and weight loss.  HENT: Negative.   Eyes: Negative.   Respiratory: Positive for shortness of breath. Negative for cough.        Chronic Shortness of Breath especially leaning forward  Cardiovascular: Negative.  Negative for chest pain.  Gastrointestinal: Negative.  Negative for blood in stool, constipation, diarrhea, melena, nausea and vomiting.  Genitourinary: Negative.   Musculoskeletal: Positive for joint pain (hips). Negative for back pain.  Skin: Negative.   Neurological: Negative.  Negative for weakness.  Endo/Heme/Allergies: Negative.   Psychiatric/Behavioral: Negative.     Past Medical History:  Diagnosis Date  . Adenocarcinoma of colon ([place])    RLL -  Pulmonary nodule (06/2015, concerning for mets vs primary; to see [facility] Dr. [name]); stage 2 adenocarinoma   2014 - Colon Cancer - surgery and chemo  . Blood infection ([place])   . Cystitis   . Diabetes mellitus without complication ([place])    Type II  . DVT (deep venous thrombosis) ([place]) 2014   post op  . Hematuria   . Hypertension   . Neuropathy   . Prostatitis   . Pulmonary nodule   . Sleep apnea     Past Surgical History:  Procedure Laterality Date  . BACK SURGERY    . CATARACT EXTRACTION     left  . CATARACT EXTRACTION W/PHACO Right 04/11/2012   Procedure: CATARACT EXTRACTION PHACO AND INTRAOCULAR LENS PLACEMENT (IOC);  Surgeon: Tonny Branch, MD;  Location: [facility] ORS;  Service: Ophthalmology;  Laterality: Right;  CDE:62.48  . CERVICAL FUSION     c4-c5  . CHOLECYSTECTOMY    . COLON SURGERY  09/29/12   Colon resection for cancer  . COLONOSCOPY    . EYE  SURGERY     cataract  . GIVENS CAPSULE STUDY N/A 03/15/2015   Procedure: GIVENS CAPSULE STUDY;  Surgeon: Rogene Houston, MD;  Location: [facility] ENDO SUITE;  Service: Endoscopy;  Laterality: N/A;  730  . LEG SURGERY    . ORIF FEMUR FRACTURE     right  . POSTERIOR CERVICAL FUSION/FORAMINOTOMY N/A 07/27/2015   Procedure: Cervical four to Cervical seven Laminectomy with Cervical four to Thoracic one Dorsal internal fixation and fusion;  Surgeon: Kevan Ny Ditty, MD;  Location: [place] [facility] OR[facility];  Service: Neurosurgery;  Laterality: N/A;  C4 to C7 Laminectomy with C4 to T1 Dorsal internal fixation and fusion    Family History  Problem Relation Age of Onset  . Heart disease Father   . Heart attack Father   . Alzheimer's disease Mother     Social History   Social History  . Marital status: Single    Spouse name: N/A  . Number of children: N/A  . Years of education: N/A   Social History Main Topics  . Smoking status: Former Smoker    Packs/day: 1.00    Years: 55.00    Types: Cigarettes    Start date: 02/07/1955  . Smokeless tobacco: Never Used     Comment: quit 2015  . Alcohol use 1.8 oz/week    3 Cans of  beer per week  . Drug use: No  . Sexual activity: Yes    Birth control/ protection: None   Other Topics Concern  . None   Social History Narrative  . None     PHYSICAL EXAMINATION  ECOG PERFORMANCE STATUS: 2 - Symptomatic, <50% confined to bed  Vitals:   11/21/16 1345  BP: (!) 116/32  Pulse: 96  Resp: 16  SpO2: 96%    Constitutional: Obese man in wheelchair, and in no distress.   HENT:  Head: Normocephalic and atraumatic.  Mouth/Throat: No oropharyngeal exudate. Mucosa moist. Eyes: Pupils are equal, round, and reactive to light. Conjunctivae are normal. No scleral icterus.  Neck: Normal range of motion. Neck supple. No JVD present.  Cardiovascular: Normal rate, regular rhythm and normal heart sounds.  Exam reveals no gallop and no friction rub.   No murmur  heard. Pulmonary/Chest: Effort normal and breath sounds normal. No respiratory distress. No wheezes.No rales.  Abdominal: Soft. Bowel sounds are normal. No distension. There is no tenderness. There is no guarding.  Musculoskeletal: No edema or tenderness.  Lymphadenopathy:    No cervical or supraclavicular adenopathy.  Neurological: Alert and oriented to person, place, and time. No cranial nerve deficit.  Skin: Skin is warm and dry. No rash noted. No erythema. No pallor.  Psychiatric: Affect and judgment normal.     LABORATORY DATA: CBC    Component Value Date/Time   WBC 7.6 11/14/2016 1045   RBC 3.93 (L) 11/14/2016 1045   HGB 12.5 (L) 11/14/2016 1045   HCT 37.2 (L) 11/14/2016 1045   PLT 229 11/14/2016 1045   MCV 94.7 11/14/2016 1045   MCH 31.8 11/14/2016 1045   MCHC 33.6 11/14/2016 1045   RDW 13.4 11/14/2016 1045   LYMPHSABS 1.7 11/14/2016 1045   MONOABS 0.7 11/14/2016 1045   EOSABS 0.1 11/14/2016 1045   BASOSABS 0.0 11/14/2016 1045      Chemistry      Component Value Date/Time   NA 134 (L) 11/14/2016 1045   K 5.2 (H) 11/14/2016 1045   CL 103 11/14/2016 1045   CO2 24 11/14/2016 1045   BUN 21 (H) 11/14/2016 1045   CREATININE 1.15 11/14/2016 1045      Component Value Date/Time   CALCIUM 8.9 11/14/2016 1045   ALKPHOS 57 11/14/2016 1045   AST 22 11/14/2016 1045   ALT 17 11/14/2016 1045   BILITOT 0.6 11/14/2016 1045     Lab Results  Component Value Date   CEA 3.5 05/10/2016     PENDING LABS:   RADIOGRAPHIC STUDIES:  Ct Chest W Contrast  Result Date: 11/17/2016 CLINICAL DATA:  Stage IV colon cancer originally diagnosed in August 2014 status post partial colectomy, with right lower lobe lung metastasis diagnosed August 2017 status post SBRT completed 10/22/2015. Restaging. EXAM: CT CHEST, ABDOMEN, AND PELVIS WITH CONTRAST TECHNIQUE: Multidetector CT imaging of the chest, abdomen and pelvis was performed following the standard protocol during bolus  administration of intravenous contrast. CONTRAST:  144mL ISOVUE-300 IOPAMIDOL (ISOVUE-300) INJECTION 61% COMPARISON:  06/30/2016 CT chest, abdomen and pelvis. FINDINGS: CT CHEST FINDINGS Cardiovascular: Normal heart size. No significant pericardial fluid/thickening. Left main, left anterior descending, left circumflex and right coronary atherosclerosis. Atherosclerotic nonaneurysmal thoracic aorta. Normal caliber pulmonary arteries. No central pulmonary emboli. Right internal jugular MediPort terminates at the junction of the right and left brachiocephalic veins. Mediastinum/Nodes: No discrete thyroid nodules. Unremarkable esophagus. No pathologically enlarged axillary, mediastinal or hilar lymph nodes. Lungs/Pleura: No pneumothorax. No pleural effusion. There  is a nodular 2.7 x 1.6 cm focus of consolidation in the medial basilar right lower lobe (series 3/ image 108), previously 1.6 x 1.1 cm. No appreciable change in patchy subpleural reticulation in both lungs without significant traction bronchiectasis or frank honeycombing. Mild centrilobular and paraseptal emphysema with mild diffuse bronchial wall thickening. No acute consolidative airspace disease or new significant pulmonary nodules. Musculoskeletal: No aggressive appearing focal osseous lesions. Moderate thoracic spondylosis. Stable chronic left scapular deformity. CT ABDOMEN PELVIS FINDINGS Hepatobiliary: Normal liver with no liver mass. Cholecystectomy. No biliary ductal dilatation. Pancreas: Normal, with no mass or duct dilation. Spleen: Normal size. No mass. Adrenals/Urinary Tract: Normal adrenals. No hydronephrosis. No renal masses. Normal bladder. Stomach/Bowel: Grossly normal stomach. Normal caliber small bowel with no small bowel wall thickening. Stable appearance of the appendix, within normal limits. Stable postsurgical changes from partial distal colectomy with intact appearing distal colonic anastomosis. Oral contrast transits to the cecum. No  large bowel wall thickening or new pericholecystic fat stranding . Vascular/Lymphatic: Atherosclerotic nonaneurysmal abdominal aorta. Patent portal, splenic, hepatic and renal veins. No pathologically enlarged lymph nodes in the abdomen or pelvis. Reproductive:  Normal size prostate. Other: No pneumoperitoneum, ascites or focal fluid collection. Stable subcutaneous calcified granulomas throughout the left lower back and gluteal regions. Musculoskeletal: No aggressive appearing focal osseous lesions. Marked lumbar spondylosis. IMPRESSION: 1. Nodular focus of consolidation in the medial basilar right lower lobe is increased in size. While this may represent evolving masslike radiation fibrosis, recurrence of the lung metastasis is not excluded. PET-CT would be useful for further evaluation. Otherwise, continued close chest CT surveillance is advised . 2. No additional potential new sites of metastatic disease in the chest, abdomen or pelvis. 3. No evidence of local tumor recurrence at the distal colonic anastomosis. 4. Left main and 3 vessel coronary atherosclerosis . 5. Aortic Atherosclerosis (ICD10-I70.0) and Emphysema (ICD10-J43.9). Electronically Signed   By: Ilona Sorrel M.D.   On: 11/17/2016 13:41   Ct Abdomen Pelvis W Contrast  Result Date: 11/17/2016 CLINICAL DATA:  Stage IV colon cancer originally diagnosed in August 2014 status post partial colectomy, with right lower lobe lung metastasis diagnosed August 2017 status post SBRT completed 10/22/2015. Restaging. EXAM: CT CHEST, ABDOMEN, AND PELVIS WITH CONTRAST TECHNIQUE: Multidetector CT imaging of the chest, abdomen and pelvis was performed following the standard protocol during bolus administration of intravenous contrast. CONTRAST:  165mL ISOVUE-300 IOPAMIDOL (ISOVUE-300) INJECTION 61% COMPARISON:  06/30/2016 CT chest, abdomen and pelvis. FINDINGS: CT CHEST FINDINGS Cardiovascular: Normal heart size. No significant pericardial fluid/thickening. Left  main, left anterior descending, left circumflex and right coronary atherosclerosis. Atherosclerotic nonaneurysmal thoracic aorta. Normal caliber pulmonary arteries. No central pulmonary emboli. Right internal jugular MediPort terminates at the junction of the right and left brachiocephalic veins. Mediastinum/Nodes: No discrete thyroid nodules. Unremarkable esophagus. No pathologically enlarged axillary, mediastinal or hilar lymph nodes. Lungs/Pleura: No pneumothorax. No pleural effusion. There is a nodular 2.7 x 1.6 cm focus of consolidation in the medial basilar right lower lobe (series 3/ image 108), previously 1.6 x 1.1 cm. No appreciable change in patchy subpleural reticulation in both lungs without significant traction bronchiectasis or frank honeycombing. Mild centrilobular and paraseptal emphysema with mild diffuse bronchial wall thickening. No acute consolidative airspace disease or new significant pulmonary nodules. Musculoskeletal: No aggressive appearing focal osseous lesions. Moderate thoracic spondylosis. Stable chronic left scapular deformity. CT ABDOMEN PELVIS FINDINGS Hepatobiliary: Normal liver with no liver mass. Cholecystectomy. No biliary ductal dilatation. Pancreas: Normal, with no mass or duct dilation.  Spleen: Normal size. No mass. Adrenals/Urinary Tract: Normal adrenals. No hydronephrosis. No renal masses. Normal bladder. Stomach/Bowel: Grossly normal stomach. Normal caliber small bowel with no small bowel wall thickening. Stable appearance of the appendix, within normal limits. Stable postsurgical changes from partial distal colectomy with intact appearing distal colonic anastomosis. Oral contrast transits to the cecum. No large bowel wall thickening or new pericholecystic fat stranding . Vascular/Lymphatic: Atherosclerotic nonaneurysmal abdominal aorta. Patent portal, splenic, hepatic and renal veins. No pathologically enlarged lymph nodes in the abdomen or pelvis. Reproductive:  Normal  size prostate. Other: No pneumoperitoneum, ascites or focal fluid collection. Stable subcutaneous calcified granulomas throughout the left lower back and gluteal regions. Musculoskeletal: No aggressive appearing focal osseous lesions. Marked lumbar spondylosis. IMPRESSION: 1. Nodular focus of consolidation in the medial basilar right lower lobe is increased in size. While this may represent evolving masslike radiation fibrosis, recurrence of the lung metastasis is not excluded. PET-CT would be useful for further evaluation. Otherwise, continued close chest CT surveillance is advised . 2. No additional potential new sites of metastatic disease in the chest, abdomen or pelvis. 3. No evidence of local tumor recurrence at the distal colonic anastomosis. 4. Left main and 3 vessel coronary atherosclerosis . 5. Aortic Atherosclerosis (ICD10-I70.0) and Emphysema (ICD10-J43.9). Electronically Signed   By: Ilona Sorrel M.D.   On: 11/17/2016 13:41   Dg Esophagus  Result Date: 10/26/2016 CLINICAL DATA:  Dysphagia. EXAM: ESOPHOGRAM/BARIUM SWALLOW TECHNIQUE: Single contrast examination was performed using  thin barium. FLUOROSCOPY TIME:  Fluoroscopy Time:  1 minutes 0 seconds Radiation Exposure Index (if provided by the fluoroscopic device): 58.9 mGy Number of Acquired Spot Images: 9 COMPARISON:  CT 06/30/2016 FINDINGS: Cervical and thoracic esophagus are widely patent. Small sliding hiatal hernia. No reflux. Peristalsis normal. Standardized barium tablet passes normally. IMPRESSION: Small sliding hiatal hernia, otherwise negative exam. 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 [name] followed by 6 months worth of adjuvant chemotherapy consisting of FOLFOX with a change in treatment to infusional 5-FU for progressive peripheral neuropathy to finish 6 months worth of treatment by Dr. [name].  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 [name].  I have reviewed patient's CT CAP in detail with him. There has been enlargement in the nodular focus of consolidation in the RLL, unclear whether this is evolving masslike radiation fibrosis vs. Recurrence of lung cancer. I will get a PET scan as recommended by radiology.   Return to clinic 1-2 days after PET to review results and to discuss the next plan of care.  ORDERS PLACED FOR THIS ENCOUNTER: Orders Placed This Encounter  Procedures  . NM PET Image Restag (PS) Skull Base To Thigh     THERAPY PLAN:  NCCN guidelines for surveillance for Colon cancer are as follows (1.2017):  A. Stage I   1. Colonoscopy at year 1    A. If advanced adenoma, repeat in 1 year    B. If no advanced adenoma, repeat in 3 years, and then every 5 years.  B. Stage II, Stage III   1. H+P every 3-6 months x 2 years and then every 6 months for  a total of 5 years    2. CEA every 3-6 months x 2 years and then every 6 months for a total of 5 years    3. CT CAP every 6-12 months (category 2B for frequency < 12 months) for a total of 5 years .   4.  Colonoscopy in 1 year except if no preoperative colonoscopy due to obstructing lesion, colonoscopy in 3-6 months.     A. If advanced adenoma, repeat in 1 year    B. If no advanced adenoma, repeat in 3 years, then every 5 years   5. PET/CT scan is not recommended.  C. Stage IV   1. H+P every 3-6 months x 2 years and then every 6 months for a total of 5 years    2. CEA every 3 months x 2 years and then every 6 months for a total of 3- 5 years    3. CT CAP every 3-6 months (category 2B for frequency < 6 months) x 2 years., then every 6-12 months for a total of 5  years .   4. Colonoscopy in 1 year except if no preoperative colonoscopy due to obstructing lesion, colonoscopy in 3-6 months.     A. If advanced adenoma, repeat in 1 year    B. If no advanced adenoma, repeat in 3 years, then every 5 years   All questions were answered. The patient knows to call the clinic with any problems, questions or concerns. We can certainly see the patient much sooner if necessary.  This note is electronically signed by: Twana First, MD 11/21/2016 3:11 PM

## 2016-11-30 ENCOUNTER — Ambulatory Visit (HOSPITAL_COMMUNITY)
Admission: RE | Admit: 2016-11-30 | Discharge: 2016-11-30 | Disposition: A | Discharge status: Home / Self Care | Payer: Medicare Other | Source: Ambulatory Visit | Attending: Oncology | Admitting: Oncology

## 2016-11-30 DIAGNOSIS — E1140 Type 2 diabetes mellitus with diabetic neuropathy, unspecified: Secondary | ICD-10-CM | POA: Diagnosis not present

## 2016-11-30 DIAGNOSIS — Z9221 Personal history of antineoplastic chemotherapy: Secondary | ICD-10-CM | POA: Insufficient documentation

## 2016-11-30 DIAGNOSIS — C189 Malignant neoplasm of colon, unspecified: Secondary | ICD-10-CM | POA: Diagnosis not present

## 2016-11-30 DIAGNOSIS — R911 Solitary pulmonary nodule: Secondary | ICD-10-CM | POA: Insufficient documentation

## 2016-11-30 DIAGNOSIS — I10 Essential (primary) hypertension: Secondary | ICD-10-CM | POA: Diagnosis not present

## 2016-11-30 DIAGNOSIS — Z86718 Personal history of other venous thrombosis and embolism: Secondary | ICD-10-CM | POA: Insufficient documentation

## 2016-11-30 DIAGNOSIS — Z87891 Personal history of nicotine dependence: Secondary | ICD-10-CM | POA: Insufficient documentation

## 2016-11-30 DIAGNOSIS — G4730 Sleep apnea, unspecified: Secondary | ICD-10-CM | POA: Diagnosis not present

## 2016-11-30 DIAGNOSIS — Z8249 Family history of ischemic heart disease and other diseases of the circulatory system: Secondary | ICD-10-CM | POA: Diagnosis not present

## 2016-11-30 DIAGNOSIS — Z9049 Acquired absence of other specified parts of digestive tract: Secondary | ICD-10-CM | POA: Diagnosis not present

## 2016-11-30 DIAGNOSIS — C7800 Secondary malignant neoplasm of unspecified lung: Secondary | ICD-10-CM | POA: Insufficient documentation

## 2016-11-30 DIAGNOSIS — Z923 Personal history of irradiation: Secondary | ICD-10-CM | POA: Insufficient documentation

## 2016-11-30 LAB — GLUCOSE, CAPILLARY: Glucose-Capillary: 165 mg/dL — ABNORMAL HIGH (ref 65–99)

## 2016-11-30 MED ORDER — FLUDEOXYGLUCOSE F - 18 (FDG) INJECTION
13.8000 | Freq: Once | INTRAVENOUS | Status: AC | PRN
  Administered 2016-11-30: 13.8 via INTRAVENOUS

## 2016-12-04 ENCOUNTER — Encounter (HOSPITAL_BASED_OUTPATIENT_CLINIC_OR_DEPARTMENT_OTHER): Payer: Medicare Other | Admitting: Oncology

## 2016-12-04 ENCOUNTER — Encounter (HOSPITAL_COMMUNITY): Payer: Self-pay

## 2016-12-04 VITALS — BP 127/51 | HR 93 | Temp 97.5°F | Resp 18

## 2016-12-04 DIAGNOSIS — Z23 Encounter for immunization: Secondary | ICD-10-CM | POA: Diagnosis not present

## 2016-12-04 DIAGNOSIS — C7801 Secondary malignant neoplasm of right lung: Secondary | ICD-10-CM

## 2016-12-04 DIAGNOSIS — G620 Drug-induced polyneuropathy: Secondary | ICD-10-CM

## 2016-12-04 DIAGNOSIS — C189 Malignant neoplasm of colon, unspecified: Secondary | ICD-10-CM | POA: Diagnosis not present

## 2016-12-04 MED ORDER — INFLUENZA VAC SPLIT HIGH-DOSE 0.5 ML IM SUSY
0.5000 mL | PREFILLED_SYRINGE | Freq: Once | INTRAMUSCULAR | Status: AC
  Administered 2016-12-04: 0.5 mL via INTRAMUSCULAR
  Filled 2016-12-04: qty 0.5

## 2016-12-04 NOTE — Progress Notes (Signed)
Gary Nelson., MD [address] [place] [address]  No diagnosis found.  CURRENT THERAPY: Surveillance per NCCN guidelines.  INTERVAL HISTORY: Gary Nelson 76 y.o. 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 [name] followed by 6 months worth of adjuvant chemotherapy consisting of FOLFOX with a change in treatment to infusional 5-FU for progressive peripheral neuropathy to finish 6 months worth of treatment by Dr. [name].  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 [name].    Adenocarcinoma of colon ([place])   10/04/2012 Definitive Surgery    Partial collectomy by Dr. [name]      10/04/2012 Pathology Results    4.5 cm poorly differentiated adenocarcinoma, negative margins, 0/7 lymph nodes for metastatic disease.  Oncotype recurrence score of 26 (high)       Chemotherapy    FOLFOX       Adverse Reaction    Progressive peripheral neuropathy       Chemotherapy    Infusional 5 FU.  Completed 6 months worth of adjuvant therapy.      02/23/2014 Procedure    Colonoscopy by Dr. Anthony Sar.      07/01/2015 PET scan    There is malignant range uptake with RLL pulmonary nodule suspicious for metastatic disease or primary pulm neoplasm. Nonspecific focus of uptake in the L femoral neck. No corresponding CT abnormality. Cannot rule out metastasis.      07/06/2015 Imaging    MR C-spine- C5-6 there is a broad-based disc bulge w R paracentral disc protrusion deforming the spinal cord. Moderate R foraminal stenosis. C6-7 there is a central disc protrusion slightly eccentric towards the R and mildly deforming the ventral c-spine       09/16/2015 Procedure    RLL needle biopsy by  IR.      09/17/2015 Pathology Results    Metastatic adenocarcinoma consistent with a primary colorectal adenocarcinoma.      10/12/2015 - 10/22/2015 Radiation Therapy    SBRT by Dr. [name] to RLL pulmonary lesion (biopsy proven to be oligometastasis).      12/09/2015 Imaging    CT chest- 1. Slight interval decrease in size of the right lower lobe pulmonary nodule. Some surrounding radiation changes. 2. No mediastinal or hilar mass or adenopathy. 3. No new pulmonary nodules. 4. Stable underline emphysematous changes. 5. Cirrhotic changes involving the liver but no upper abdominal metastatic disease.      03/14/2016 Imaging    CT chest- 1. Continued mild reduction in the size of the treated right lobe pulmonary nodule. 2. Increased patchy consolidation, reticulation and ground-glass attenuation in the basilar right lower lobe, nonspecific, favor evolving postradiation change, recommend attention on follow-up chest CT. 3. No evidence of new or progressive metastatic disease in the chest. 4. Aortic atherosclerosis. Left main and 3 vessel coronary atherosclerosis. 5. Mild emphysema.      06/30/2016 Imaging    CT CAP- 1. The appearance of the treated right lower lobe nodule is essentially unchanged, and there are evolving postradiation changes in the base of the right lower lobe, as above. No definite signs of new metastatic disease are noted elsewhere in the chest, abdomen or pelvis. 2. Aortic atherosclerosis, in addition to left main and  3 vessel coronary artery disease. Assessment for potential risk factor modification, dietary therapy or pharmacologic therapy may be warranted, if clinically indicated. 3. Morphologic changes in the liver suggestive of cirrhosis, as above. 4. Additional incidental findings, similar prior studies, as above.      11/17/2016 Imaging    CT CAP Study Result   CLINICAL DATA:  Stage IV colon cancer originally diagnosed in August 2014 status post  partial colectomy, with right lower lobe lung metastasis diagnosed August 2017 status post SBRT completed 10/22/2015. Restaging.  EXAM: CT CHEST, ABDOMEN, AND PELVIS WITH CONTRAST  TECHNIQUE: Multidetector CT imaging of the chest, abdomen and pelvis was performed following the standard protocol during bolus administration of intravenous contrast.  CONTRAST:  138mL ISOVUE-300 IOPAMIDOL (ISOVUE-300) INJECTION 61%  COMPARISON:  06/30/2016 CT chest, abdomen and pelvis.  FINDINGS: CT CHEST FINDINGS  Cardiovascular: Normal heart size. No significant pericardial fluid/thickening. Left main, left anterior descending, left circumflex and right coronary atherosclerosis. Atherosclerotic nonaneurysmal thoracic aorta. Normal caliber pulmonary arteries. No central pulmonary emboli. Right internal jugular MediPort terminates at the junction of the right and left brachiocephalic veins.  Mediastinum/Nodes: No discrete thyroid nodules. Unremarkable esophagus. No pathologically enlarged axillary, mediastinal or hilar lymph nodes.  Lungs/Pleura: No pneumothorax. No pleural effusion. There is a nodular 2.7 x 1.6 cm focus of consolidation in the medial basilar right lower lobe (series 3/ image 108), previously 1.6 x 1.1 cm. No appreciable change in patchy subpleural reticulation in both lungs without significant traction bronchiectasis or frank honeycombing. Mild centrilobular and paraseptal emphysema with mild diffuse bronchial wall thickening. No acute consolidative airspace disease or new significant pulmonary nodules.  Musculoskeletal: No aggressive appearing focal osseous lesions. Moderate thoracic spondylosis. Stable chronic left scapular deformity.  CT ABDOMEN PELVIS FINDINGS  Hepatobiliary: Normal liver with no liver mass. Cholecystectomy. No biliary ductal dilatation.  Pancreas: Normal, with no mass or duct dilation.  Spleen: Normal size. No  mass.  Adrenals/Urinary Tract: Normal adrenals. No hydronephrosis. No renal masses. Normal bladder.  Stomach/Bowel: Grossly normal stomach. Normal caliber small bowel with no small bowel wall thickening. Stable appearance of the appendix, within normal limits. Stable postsurgical changes from partial distal colectomy with intact appearing distal colonic anastomosis. Oral contrast transits to the cecum. No large bowel wall thickening or new pericholecystic fat stranding .  Vascular/Lymphatic: Atherosclerotic nonaneurysmal abdominal aorta. Patent portal, splenic, hepatic and renal veins. No pathologically enlarged lymph nodes in the abdomen or pelvis.  Reproductive:  Normal size prostate.  Other: No pneumoperitoneum, ascites or focal fluid collection. Stable subcutaneous calcified granulomas throughout the left lower back and gluteal regions.  Musculoskeletal: No aggressive appearing focal osseous lesions. Marked lumbar spondylosis.  IMPRESSION: 1. Nodular focus of consolidation in the medial basilar right lower lobe is increased in size. While this may represent evolving masslike radiation fibrosis, recurrence of the lung metastasis is not excluded. PET-CT would be useful for further evaluation. Otherwise, continued close chest CT surveillance is advised . 2. No additional potential new sites of metastatic disease in the chest, abdomen or pelvis. 3. No evidence of local tumor recurrence at the distal colonic anastomosis. 4. Left main and 3 vessel coronary atherosclerosis . 5. Aortic Atherosclerosis (ICD10-I70.0) and Emphysema (ICD10-J43.9).         11/30/2016 Relapse/Recurrence    PET: IMPRESSION: 1. Enlarging right lower lobe pulmonary nodule and slight increase in the SUV max suggesting residual neoplasm. No new pulmonary lesions. 2. No findings for local recurrence or abdominal/pelvic metastatic disease.  Patient presents today to review his PET scan  results.  PET scan performed on 11/30/2016 does show that the right lower lobe nodular lesion is hypermetabolic with SUV max of 6.1 and it had increased in size from 18 x 12 mm on the prior study to 26 x 16 mm on this study.  Patient continues to have his chronic issues of chronic shortness of breath, pain in his hips, with dysphagia to solids due to his hiatal hernia.  Otherwise he has no new complaints today.  Review of Systems  Constitutional: Negative for chills, fever, malaise/fatigue and weight loss.  HENT: Negative.   Eyes: Negative.   Respiratory: Positive for shortness of breath (chronic). Negative for cough.        Chronic Shortness of Breath especially leaning forward  Cardiovascular: Negative.  Negative for chest pain.  Gastrointestinal: Negative.  Negative for blood in stool, constipation, diarrhea, melena, nausea and vomiting.  Genitourinary: Negative.   Musculoskeletal: Positive for joint pain (hips). Negative for back pain.  Skin: Negative.   Neurological: Negative.  Negative for weakness.  Endo/Heme/Allergies: Negative.   Psychiatric/Behavioral: Negative.     Past Medical History:  Diagnosis Date  . Adenocarcinoma of colon ([facility])    RLL -  Pulmonary nodule (06/2015, concerning for mets vs primary; to see [facility] Dr. [name]); stage 2 adenocarinoma   2014 - Colon Cancer - surgery and chemo  . Blood infection ([facility])   . Cystitis   . 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 DVT (deep venous thrombosis) ([facility]) 2014   post op  . Hematuria   . Hypertension   . Neuropathy   . Prostatitis   . Pulmonary nodule   . Sleep apnea     Past Surgical History:  Procedure Laterality Date  . BACK SURGERY    . CATARACT EXTRACTION     left  . CATARACT EXTRACTION W/PHACO Right 04/11/2012   Procedure: CATARACT EXTRACTION PHACO AND INTRAOCULAR LENS PLACEMENT (IOC);  Surgeon: Tonny Branch, MD;  Location: [facility];  Service: Ophthalmology;  Laterality: Right;  CDE:62.48  .  CERVICAL FUSION     c4-c5  . CHOLECYSTECTOMY    . COLON SURGERY  09/29/12   Colon resection for cancer  . COLONOSCOPY    . EYE SURGERY     cataract  . GIVENS CAPSULE STUDY N/A 03/15/2015   Procedure: GIVENS CAPSULE STUDY;  Surgeon: Rogene Houston, MD;  Location: [facility];  Service: Endoscopy;  Laterality: N/A;  730  . LEG SURGERY    . ORIF FEMUR FRACTURE     right  . POSTERIOR CERVICAL FUSION/FORAMINOTOMY N/A 07/27/2015   Procedure: Cervical four to Cervical seven Laminectomy with Cervical four to Thoracic one Dorsal internal fixation and fusion;  Surgeon: Kevan Ny Ditty, MD;  Location: [facility];  Service: Neurosurgery;  Laterality: N/A;  C4 to C7 Laminectomy with C4 to T1 Dorsal internal fixation and fusion    Family History  Problem Relation Age of Onset  . Heart disease Father   . Heart attack Father   . Alzheimer's disease Mother     Social History   Social History  . Marital status: Single    Spouse name: N/A  . Number of children: N/A  . Years of education: N/A   Social History Main Topics  . Smoking status: Former Smoker    Packs/day: 1.00    Years: 55.00    Types: Cigarettes    Start date:  02/07/1955  . Smokeless tobacco: Never Used     Comment: quit 2015  . Alcohol use 1.8 oz/week    3 Cans of beer per week  . Drug use: No  . Sexual activity: Yes    Birth control/ protection: None   Other Topics Concern  . Not on file   Social History Narrative  . No narrative on file     PHYSICAL EXAMINATION  ECOG PERFORMANCE STATUS: 2 - Symptomatic, <50% confined to bed  Vitals:   12/04/16 0931  BP: (!) 127/51  Pulse: 93  Resp: 18  Temp: (!) 97.5 F (36.4 C)  SpO2: 97%    Constitutional: Obese man in wheelchair, and in no distress.   HENT:  Head: Normocephalic and atraumatic.  Mouth/Throat: No oropharyngeal exudate. Mucosa moist. Eyes: Pupils are equal, round, and reactive to light. Conjunctivae are normal. No scleral icterus.  Neck: Normal  range of motion. Neck supple. No JVD present.  Cardiovascular: Normal rate, regular rhythm and normal heart sounds.  Exam reveals no gallop and no friction rub.   No murmur heard. Pulmonary/Chest: Effort normal and breath sounds normal. No respiratory distress. No wheezes.No rales.  Abdominal: Soft. Bowel sounds are normal. No distension. There is no tenderness. There is no guarding.  Musculoskeletal: No edema or tenderness.  Lymphadenopathy:    No cervical or supraclavicular adenopathy.  Neurological: Alert and oriented to person, place, and time. No cranial nerve deficit.  Skin: Skin is warm and dry. No rash noted. No erythema. No pallor.  Psychiatric: Affect and judgment normal.     LABORATORY DATA: CBC    Component Value Date/Time   WBC 7.6 11/14/2016 1045   RBC 3.93 (L) 11/14/2016 1045   HGB 12.5 (L) 11/14/2016 1045   HCT 37.2 (L) 11/14/2016 1045   PLT 229 11/14/2016 1045   MCV 94.7 11/14/2016 1045   MCH 31.8 11/14/2016 1045   MCHC 33.6 11/14/2016 1045   RDW 13.4 11/14/2016 1045   LYMPHSABS 1.7 11/14/2016 1045   MONOABS 0.7 11/14/2016 1045   EOSABS 0.1 11/14/2016 1045   BASOSABS 0.0 11/14/2016 1045      Chemistry      Component Value Date/Time   NA 134 (L) 11/14/2016 1045   K 5.2 (H) 11/14/2016 1045   CL 103 11/14/2016 1045   CO2 24 11/14/2016 1045   BUN 21 (H) 11/14/2016 1045   CREATININE 1.15 11/14/2016 1045      Component Value Date/Time   CALCIUM 8.9 11/14/2016 1045   ALKPHOS 57 11/14/2016 1045   AST 22 11/14/2016 1045   ALT 17 11/14/2016 1045   BILITOT 0.6 11/14/2016 1045     Lab Results  Component Value Date   CEA 3.5 05/10/2016     PENDING LABS:   RADIOGRAPHIC STUDIES:  Ct Chest W Contrast  Result Date: 11/17/2016 CLINICAL DATA:  Stage IV colon cancer originally diagnosed in August 2014 status post partial colectomy, with right lower lobe lung metastasis diagnosed August 2017 status post SBRT completed 10/22/2015. Restaging. EXAM: CT CHEST,  ABDOMEN, AND PELVIS WITH CONTRAST TECHNIQUE: Multidetector CT imaging of the chest, abdomen and pelvis was performed following the standard protocol during bolus administration of intravenous contrast. CONTRAST:  162mL ISOVUE-300 IOPAMIDOL (ISOVUE-300) INJECTION 61% COMPARISON:  06/30/2016 CT chest, abdomen and pelvis. FINDINGS: CT CHEST FINDINGS Cardiovascular: Normal heart size. No significant pericardial fluid/thickening. Left main, left anterior descending, left circumflex and right coronary atherosclerosis. Atherosclerotic nonaneurysmal thoracic aorta. Normal caliber pulmonary arteries. No central pulmonary emboli.  Right internal jugular MediPort terminates at the junction of the right and left brachiocephalic veins. Mediastinum/Nodes: No discrete thyroid nodules. Unremarkable esophagus. No pathologically enlarged axillary, mediastinal or hilar lymph nodes. Lungs/Pleura: No pneumothorax. No pleural effusion. There is a nodular 2.7 x 1.6 cm focus of consolidation in the medial basilar right lower lobe (series 3/ image 108), previously 1.6 x 1.1 cm. No appreciable change in patchy subpleural reticulation in both lungs without significant traction bronchiectasis or frank honeycombing. Mild centrilobular and paraseptal emphysema with mild diffuse bronchial wall thickening. No acute consolidative airspace disease or new significant pulmonary nodules. Musculoskeletal: No aggressive appearing focal osseous lesions. Moderate thoracic spondylosis. Stable chronic left scapular deformity. CT ABDOMEN PELVIS FINDINGS Hepatobiliary: Normal liver with no liver mass. Cholecystectomy. No biliary ductal dilatation. Pancreas: Normal, with no mass or duct dilation. Spleen: Normal size. No mass. Adrenals/Urinary Tract: Normal adrenals. No hydronephrosis. No renal masses. Normal bladder. Stomach/Bowel: Grossly normal stomach. Normal caliber small bowel with no small bowel wall thickening. Stable appearance of the appendix, within  normal limits. Stable postsurgical changes from partial distal colectomy with intact appearing distal colonic anastomosis. Oral contrast transits to the cecum. No large bowel wall thickening or new pericholecystic fat stranding . Vascular/Lymphatic: Atherosclerotic nonaneurysmal abdominal aorta. Patent portal, splenic, hepatic and renal veins. No pathologically enlarged lymph nodes in the abdomen or pelvis. Reproductive:  Normal size prostate. Other: No pneumoperitoneum, ascites or focal fluid collection. Stable subcutaneous calcified granulomas throughout the left lower back and gluteal regions. Musculoskeletal: No aggressive appearing focal osseous lesions. Marked lumbar spondylosis. IMPRESSION: 1. Nodular focus of consolidation in the medial basilar right lower lobe is increased in size. While this may represent evolving masslike radiation fibrosis, recurrence of the lung metastasis is not excluded. PET-CT would be useful for further evaluation. Otherwise, continued close chest CT surveillance is advised . 2. No additional potential new sites of metastatic disease in the chest, abdomen or pelvis. 3. No evidence of local tumor recurrence at the distal colonic anastomosis. 4. Left main and 3 vessel coronary atherosclerosis . 5. Aortic Atherosclerosis (ICD10-I70.0) and Emphysema (ICD10-J43.9). Electronically Signed   By: Ilona Sorrel M.D.   On: 11/17/2016 13:41   Ct Abdomen Pelvis W Contrast  Result Date: 11/17/2016 CLINICAL DATA:  Stage IV colon cancer originally diagnosed in August 2014 status post partial colectomy, with right lower lobe lung metastasis diagnosed August 2017 status post SBRT completed 10/22/2015. Restaging. EXAM: CT CHEST, ABDOMEN, AND PELVIS WITH CONTRAST TECHNIQUE: Multidetector CT imaging of the chest, abdomen and pelvis was performed following the standard protocol during bolus administration of intravenous contrast. CONTRAST:  144mL ISOVUE-300 IOPAMIDOL (ISOVUE-300) INJECTION 61%  COMPARISON:  06/30/2016 CT chest, abdomen and pelvis. FINDINGS: CT CHEST FINDINGS Cardiovascular: Normal heart size. No significant pericardial fluid/thickening. Left main, left anterior descending, left circumflex and right coronary atherosclerosis. Atherosclerotic nonaneurysmal thoracic aorta. Normal caliber pulmonary arteries. No central pulmonary emboli. Right internal jugular MediPort terminates at the junction of the right and left brachiocephalic veins. Mediastinum/Nodes: No discrete thyroid nodules. Unremarkable esophagus. No pathologically enlarged axillary, mediastinal or hilar lymph nodes. Lungs/Pleura: No pneumothorax. No pleural effusion. There is a nodular 2.7 x 1.6 cm focus of consolidation in the medial basilar right lower lobe (series 3/ image 108), previously 1.6 x 1.1 cm. No appreciable change in patchy subpleural reticulation in both lungs without significant traction bronchiectasis or frank honeycombing. Mild centrilobular and paraseptal emphysema with mild diffuse bronchial wall thickening. No acute consolidative airspace disease or new significant pulmonary nodules. Musculoskeletal:  No aggressive appearing focal osseous lesions. Moderate thoracic spondylosis. Stable chronic left scapular deformity. CT ABDOMEN PELVIS FINDINGS Hepatobiliary: Normal liver with no liver mass. Cholecystectomy. No biliary ductal dilatation. Pancreas: Normal, with no mass or duct dilation. Spleen: Normal size. No mass. Adrenals/Urinary Tract: Normal adrenals. No hydronephrosis. No renal masses. Normal bladder. Stomach/Bowel: Grossly normal stomach. Normal caliber small bowel with no small bowel wall thickening. Stable appearance of the appendix, within normal limits. Stable postsurgical changes from partial distal colectomy with intact appearing distal colonic anastomosis. Oral contrast transits to the cecum. No large bowel wall thickening or new pericholecystic fat stranding . Vascular/Lymphatic: Atherosclerotic  nonaneurysmal abdominal aorta. Patent portal, splenic, hepatic and renal veins. No pathologically enlarged lymph nodes in the abdomen or pelvis. Reproductive:  Normal size prostate. Other: No pneumoperitoneum, ascites or focal fluid collection. Stable subcutaneous calcified granulomas throughout the left lower back and gluteal regions. Musculoskeletal: No aggressive appearing focal osseous lesions. Marked lumbar spondylosis. IMPRESSION: 1. Nodular focus of consolidation in the medial basilar right lower lobe is increased in size. While this may represent evolving masslike radiation fibrosis, recurrence of the lung metastasis is not excluded. PET-CT would be useful for further evaluation. Otherwise, continued close chest CT surveillance is advised . 2. No additional potential new sites of metastatic disease in the chest, abdomen or pelvis. 3. No evidence of local tumor recurrence at the distal colonic anastomosis. 4. Left main and 3 vessel coronary atherosclerosis . 5. Aortic Atherosclerosis (ICD10-I70.0) and Emphysema (ICD10-J43.9). Electronically Signed   By: Ilona Sorrel M.D.   On: 11/17/2016 13:41   Nm Pet Image Restag (ps) Skull Base To Thigh  Result Date: 11/30/2016 CLINICAL DATA:  Subsequent treatment strategy for metastatic colon cancer. EXAM: NUCLEAR MEDICINE PET SKULL BASE TO THIGH TECHNIQUE: 13.8 mCi F-18 FDG was injected intravenously. Full-ring PET imaging was performed from the skull base to thigh after the radiotracer. CT data was obtained and used for attenuation correction and anatomic localization. FASTING BLOOD GLUCOSE:  Value: 165 mg/dl COMPARISON:  PET-CT 07/01/2015 and CT scans 11/17/2016. FINDINGS: NECK: No hypermetabolic lymph nodes in the neck. CHEST: No hypermetabolic mediastinal or hilar nodes. 26 x 16 mm right lower lobe nodular lesion is hypermetabolic with SUV max of 6.1. This was also metabolically active on the prior examination with SUV max of 5.2. It measured 18 x 12 mm on the  prior study. No new pulmonary lesions.  No acute pulmonary findings. Stable advanced three-vessel coronary artery calcifications. ABDOMEN/PELVIS: No abnormal hypermetabolic activity within the liver, pancreas, adrenal glands, or spleen. No hypermetabolic lymph nodes in the abdomen or pelvis. SKELETON: No focal hypermetabolic activity to suggest skeletal metastasis. IMPRESSION: 1. Enlarging right lower lobe pulmonary nodule and slight increase in the SUV max suggesting residual neoplasm. No new pulmonary lesions. 2. No findings for local recurrence or abdominal/pelvic metastatic disease. Electronically Signed   By: Marijo Sanes M.D.   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 [name] followed by 6 months worth of adjuvant chemotherapy consisting of FOLFOX with a change in treatment to infusional 5-FU for progressive peripheral neuropathy to finish 6 months worth of treatment by Dr. [name].  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  Moody.  I have reviewed patient's PET scan in detail with him today. His RLL mass is enlarging and is hypermetabolic. This is the only site of recurrent disease that he has. I will refer him back to Dr. [name] to see if he is a candidate for SBRT to that same area again.   RTC in 6 weeks for follow up. If he is not a candidate for radiation, then RTC sooner to discuss the next plan of care.     THERAPY PLAN:  NCCN guidelines for surveillance for Colon cancer are as follows (1.2017):  A. Stage I   1. Colonoscopy at year 1    A. If advanced adenoma, repeat in 1 year    B. If no advanced adenoma, repeat in 3 years, and then every 5  years.  B. Stage II, Stage III   1. H+P every 3-6 months x 2 years and then every 6 months for a total of 5 years    2. CEA every 3-6 months x 2 years and then every 6 months for a total of 5 years    3. CT CAP every 6-12 months (category 2B for frequency < 12 months) for a total of 5 years .   4.  Colonoscopy in 1 year except if no preoperative colonoscopy due to obstructing lesion, colonoscopy in 3-6 months.     A. If advanced adenoma, repeat in 1 year    B. If no advanced adenoma, repeat in 3 years, then every 5 years   5. PET/CT scan is not recommended.  C. Stage IV   1. H+P every 3-6 months x 2 years and then every 6 months for a total of 5 years    2. CEA every 3 months x 2 years and then every 6 months for a total of 3- 5 years    3. CT CAP every 3-6 months (category 2B for frequency < 6 months) x 2 years., then every 6-12 months for a total of 5 years .   4. Colonoscopy in 1 year except if no preoperative colonoscopy due to obstructing lesion, colonoscopy in 3-6 months.     A. If advanced adenoma, repeat in 1 year    B. If no advanced adenoma, repeat in 3 years, then every 5 years   All questions were answered. The patient knows to call the clinic with any problems, questions or concerns. We can certainly see the patient much sooner if necessary.  This note is electronically signed by: Twana First, MD 12/04/2016 9:28 AM

## 2016-12-07 ENCOUNTER — Encounter: Payer: Self-pay | Admitting: Radiation Oncology

## 2016-12-08 NOTE — Progress Notes (Addendum)
Thoracic Location of Tumor / Histology: Recurrence of right lung oligometastatic disease   Patient presented  months ago with symptoms of:11-30-16 Chronic shortness of breath , pain in his hips, with dysphagia to solids due to his hiatal hernia, emphysema  IMPRESSION: 11-30-16 1. Enlarging right lower lobe pulmonary nodule and slight increase in the SUV max suggesting residual neoplasm. No new pulmonary lesions. 2. No findings for local recurrence or abdominal/pelvic metastatic disease.  11-17-16  CT chest w contrast  IMPRESSION: 1. Nodular focus of consolidation in the medial basilar right lower lobe is increased in size. While this may represent evolving masslike radiation fibrosis, recurrence of the lung metastasis is not excluded. PET-CT would be useful for further evaluation. Otherwise, continued close chest CT surveillance is advised . 2. No additional potential new sites of metastatic disease in the chest, abdomen or pelvis. 3. No evidence of local tumor recurrence at the distal colonic anastomosis. 4. Left main and 3 vessel coronary atherosclerosis . 5. Aortic Atherosclerosis (ICD10-I70.0) and Emphysema (ICD10-J43.9).   IMPRESSION: 11-17-16  CT Abdomen Pelvis w contrast 1. Nodular focus of consolidation in the medial basilar right lower lobe is increased in size. While this may represent evolving masslike radiation fibrosis, recurrence of the lung metastasis is not excluded. PET-CT would be useful for further evaluation. Otherwise, continued close chest CT surveillance is advised . 2. No additional potential new sites of metastatic disease in the chest, abdomen or pelvis. 3. No evidence of local tumor recurrence at the distal colonic anastomosis. 4. Left main and 3 vessel coronary atherosclerosis . 5. Aortic Atherosclerosis (ICD10-I70.0) and Emphysema (ICD10-J43.9).  Biopsies of  (if applicable) revealed: N/A  Tobacco/Marijuana/Snuff/ETOH use:Former smoker 1 PD x 55  years, Drinks beer 3 a month, no drug usage  Past/Anticipated interventions by cardiothoracic surgery, if any: No  Past/Anticipated interventions by medical oncology, if any:Dr. Zhou 11-30-16 PET scan, 11-17-16 CT chest w contrast and CT abdomen pelvis w contrast Using O2 at 2 L/min at night. Signs/Symptoms Weight changes, if any: Wt Readings from Last 3 Encounters:  12/12/16 270 lb (122.5 kg)  08/16/16 275 lb (124.7 kg)  03/10/16 262 lb 11.2 oz (119.2 kg)      Respiratory complaints, if any: Chronic shortness of breath ,emphysema,coughing clear secretion  Hemoptysis, if any: No  Pain issues, if any:  5/10 bilateral hips taking Tylenol  500 mg to 650 mg  SAFETY ISSUES:  Prior radiation?:10-12-15-10-22-15 RLL  Pacemaker/ICD? :No  Possible current pregnancy? : No  Is the patient on methotrexate? :No  Current Complaints / other details: 76 y.o. male with a history of Stage IV adenocarcinoma with a positive biopsy for adenocarcinoma of colorectal primary of RLL pulmonary nodule with a history of Stage II colorectal adenocarcinoma having finished treatment with curative intent in 2014-2015 consisting of partial colectomy on 10/04/2012 by Dr. [name] followed by 6 months worth of adjuvant chemotherapy consisting of FOLFOX with a change in treatment to infusional 5-FU for  progressive peripheral neuropathy to finish 6 months worth of treatment by Dr. [name].  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 [name]. BP (!) 140/54   Pulse 72   Temp 97.6 F (36.4 C) (Oral)   Resp 20   Ht 5\' 5"  (1.651 m)   Wt 270 lb (122.5 kg)   SpO2 100%   BMI 44.93 kg/m

## 2016-12-12 ENCOUNTER — Ambulatory Visit
Admission: RE | Admit: 2016-12-12 | Discharge: 2016-12-12 | Disposition: A | Discharge status: Home / Self Care | Payer: Medicare Other | Source: Ambulatory Visit | Attending: Radiation Oncology | Admitting: Radiation Oncology

## 2016-12-12 ENCOUNTER — Encounter: Payer: Self-pay | Admitting: Radiation Oncology

## 2016-12-12 VITALS — BP 140/54 | HR 72 | Temp 97.6°F | Resp 20 | Ht 65.0 in | Wt 270.0 lb

## 2016-12-12 DIAGNOSIS — I10 Essential (primary) hypertension: Secondary | ICD-10-CM | POA: Diagnosis not present

## 2016-12-12 DIAGNOSIS — C7801 Secondary malignant neoplasm of right lung: Secondary | ICD-10-CM

## 2016-12-12 DIAGNOSIS — Z86718 Personal history of other venous thrombosis and embolism: Secondary | ICD-10-CM | POA: Diagnosis not present

## 2016-12-12 DIAGNOSIS — C189 Malignant neoplasm of colon, unspecified: Secondary | ICD-10-CM

## 2016-12-12 DIAGNOSIS — Z9889 Other specified postprocedural states: Secondary | ICD-10-CM | POA: Insufficient documentation

## 2016-12-12 DIAGNOSIS — Z820 Family history of epilepsy and other diseases of the nervous system: Secondary | ICD-10-CM | POA: Insufficient documentation

## 2016-12-12 DIAGNOSIS — Z510 Encounter for antineoplastic radiation therapy: Secondary | ICD-10-CM | POA: Diagnosis not present

## 2016-12-12 DIAGNOSIS — Z85038 Personal history of other malignant neoplasm of large intestine: Secondary | ICD-10-CM | POA: Diagnosis not present

## 2016-12-12 DIAGNOSIS — Z7982 Long term (current) use of aspirin: Secondary | ICD-10-CM | POA: Diagnosis not present

## 2016-12-12 DIAGNOSIS — Z794 Long term (current) use of insulin: Secondary | ICD-10-CM | POA: Diagnosis not present

## 2016-12-12 DIAGNOSIS — Z79899 Other long term (current) drug therapy: Secondary | ICD-10-CM | POA: Diagnosis not present

## 2016-12-12 DIAGNOSIS — G4730 Sleep apnea, unspecified: Secondary | ICD-10-CM | POA: Insufficient documentation

## 2016-12-12 DIAGNOSIS — Z8249 Family history of ischemic heart disease and other diseases of the circulatory system: Secondary | ICD-10-CM | POA: Insufficient documentation

## 2016-12-12 DIAGNOSIS — Z87891 Personal history of nicotine dependence: Secondary | ICD-10-CM | POA: Diagnosis not present

## 2016-12-12 DIAGNOSIS — E119 Type 2 diabetes mellitus without complications: Secondary | ICD-10-CM | POA: Diagnosis not present

## 2016-12-12 DIAGNOSIS — Z923 Personal history of irradiation: Secondary | ICD-10-CM | POA: Diagnosis not present

## 2016-12-12 DIAGNOSIS — Z9049 Acquired absence of other specified parts of digestive tract: Secondary | ICD-10-CM | POA: Insufficient documentation

## 2016-12-12 DIAGNOSIS — Z85048 Personal history of other malignant neoplasm of rectum, rectosigmoid junction, and anus: Secondary | ICD-10-CM | POA: Diagnosis not present

## 2016-12-12 NOTE — Progress Notes (Signed)
Radiation Oncology         [phone] ________________________________  Name: Gary Nelson MRN: [id number]  Date: 12/12/2016  DOB: 03-Feb-1941  Post Treatment Note  CC: [name], MD  Twana First, MD  Diagnosis:   Stage II (T3N0M0)- high risk- adenocarcinoma of colon  Interval Since Last Radiation: 13 months  10/12/2015 through 10/22/2015 SBRT: The tumor in the right lung was treated with a course of stereotactic body radiation treatment. The patient received 50 Gy In 5 fractions at 10 G per fraction.  Narrative:  Gary Nelson is a pleasant 76 y.o. patient who was originally cared for by  Dr. [name] after undergoing definitive surgery by Dr. [name] on 10/04/2012 with subsequent recurrence of disease in a pulmonary nodule. He had a biopsy scheduled however did not pursue this, and a PET scan on 07/01/15 revealed a right lower lobe nodule and nonspecific focus of hypermetabolism in the left femoral neck. He went on to receive SBRT with Dr. [name]. He has been followed with serial imaging and his scan in November 2017, February 2018, and May 2018 were all stable scans in the site of his prior right lower lobe treatment. In October 2018 the lesion was noted to have enlarged to 27 x 16 mm, and had previously been 1.6 x 1.1 cm. He underwent PET scan which revealed hypermetabolism in the area of prior treatment in the RLL with an SUV of 6.1. Prior to treatment in 2017 the SUV had been 5.2. He comes today to discuss options of re-irradiating this site.  Past Medical History:  Past Medical History:  Diagnosis Date  . Adenocarcinoma of colon ([place])    RLL -  Pulmonary nodule (06/2015, concerning for mets vs primary; to see [facility] Dr. [name]); stage 2 adenocarinoma   2014 - Colon Cancer - surgery and chemo  . Blood infection ([place])   . Cystitis   . Diabetes mellitus without complication ([place])    Type II  . DVT (deep venous thrombosis) ([place]) 2014   post op  . Hematuria   . Hypertension   .  Neuropathy   . Prostatitis   . Pulmonary nodule   . Sleep apnea     Past Surgical History: Past Surgical History:  Procedure Laterality Date  . BACK SURGERY    . CATARACT EXTRACTION     left  . CERVICAL FUSION     c4-c5  . CHOLECYSTECTOMY    . COLON SURGERY  09/29/12   Colon resection for cancer  . COLONOSCOPY    . EYE SURGERY     cataract  . LEG SURGERY    . ORIF FEMUR FRACTURE     right    Social History:  Social History   Socioeconomic History  . Marital status: Single    Spouse name: Not on file  . Number of children: Not on file  . Years of education: Not on file  . Highest education level: Not on file  Social Needs  . Financial resource strain: Not on file  . Food insecurity - worry: Not on file  . Food insecurity - inability: Not on file  . Transportation needs - medical: Not on file  . Transportation needs - non-medical: Not on file  Occupational History  . Not on file  Tobacco Use  . Smoking status: Former Smoker    Packs/day: 1.00    Years: 55.00    Pack years: 55.00    Types: Cigarettes    Start  date: 02/07/1955  . Smokeless tobacco: Never Used  . Tobacco comment: quit 2015  Substance and Sexual Activity  . Alcohol use: Yes    Alcohol/week: 1.8 oz    Types: 3 Cans of beer per week  . Drug use: No  . Sexual activity: Yes    Birth control/protection: None  Other Topics Concern  . Not on file  Social History Narrative  . Not on file  The patient is single and is accompanied by his brother.   Family History: Family History  Problem Relation Age of Onset  . Heart disease Father   . Heart attack Father   . Alzheimer's disease Mother      ALLERGIES:  has No Known Allergies.to go back  Meds: Current Outpatient Medications  Medication Sig Dispense Refill  . acetaminophen (TYLENOL) 500 MG tablet Take 500 mg every 6 (six) hours as needed by mouth.    Marland Kitchen acetaminophen (TYLENOL) 650 MG CR tablet Take 650 mg every 8 (eight) hours as needed by  mouth for pain.    Marland Kitchen aspirin EC 81 MG tablet Take 81 mg by mouth daily.    . clotrimazole (LOTRIMIN) 1 % cream Apply 1 application topically daily as needed (rash).     . furosemide (LASIX) 20 MG tablet Take 20 mg by mouth daily.    Marland Kitchen gabapentin (NEURONTIN) 300 MG capsule Take 2 capsules (600 mg total) by mouth 3 (three) times daily. (Patient taking differently: Take 600 mg by mouth 2 (two) times daily. ) 180 capsule 2  . insulin NPH (HUMULIN N,NOVOLIN N) 100 UNIT/ML injection Inject 15-30 Units into the skin See admin instructions. 25 units every morning and 12 units in the evening    . lisinopril (PRINIVIL,ZESTRIL) 40 MG tablet Take 40 mg by mouth daily.    . OXYGEN Inhale 2 L into the lungs continuous. At night time only    . potassium chloride SA (K-DUR,KLOR-CON) 20 MEQ tablet Take 20 mEq by mouth daily.      No current facility-administered medications for this encounter.     Physical Findings:  height is 5\' 5"  (1.651 m) and weight is 270 lb (122.5 kg). His oral temperature is 97.6 F (36.4 C). His blood pressure is 140/54 (abnormal) and his pulse is 72. His respiration is 20 and oxygen saturation is 100%.  In general this is a well appearing caucasian male in no acute distress. He is alert and oriented x4 and appropriate throughout the examination. HEENT reveals that the patient is normocephalic, atraumatic. EOMs are intact. PERRLA. Skin is intact without any evidence of gross lesions. Cardiovascular exam reveals a regular rate and rhythm, no clicks rubs or murmurs are auscultated. Chest is clear to auscultation bilaterally. Lymphatic assessment is performed and does not reveal any adenopathy in the cervical, supraclavicular, axillary, or inguinal chains. Abdomen has active bowel sounds in all quadrants and is intact. The abdomen is soft, non tender, non distended. Lower extremities are negative for pretibial pitting edema, deep calf tenderness, cyanosis or clubbing.   Lab Findings: Lab  Results  Component Value Date   WBC 7.6 11/14/2016   HGB 12.5 (L) 11/14/2016   HCT 37.2 (L) 11/14/2016   MCV 94.7 11/14/2016   PLT 229 11/14/2016     Radiographic Findings: Ct Chest W Contrast  Result Date: 11/17/2016 CLINICAL DATA:  Stage IV colon cancer originally diagnosed in August 2014 status post partial colectomy, with right lower lobe lung metastasis diagnosed August 2017 status post SBRT completed  10/22/2015. Restaging. EXAM: CT CHEST, ABDOMEN, AND PELVIS WITH CONTRAST TECHNIQUE: Multidetector CT imaging of the chest, abdomen and pelvis was performed following the standard protocol during bolus administration of intravenous contrast. CONTRAST:  166mL ISOVUE-300 IOPAMIDOL (ISOVUE-300) INJECTION 61% COMPARISON:  06/30/2016 CT chest, abdomen and pelvis. FINDINGS: CT CHEST FINDINGS Cardiovascular: Normal heart size. No significant pericardial fluid/thickening. Left main, left anterior descending, left circumflex and right coronary atherosclerosis. Atherosclerotic nonaneurysmal thoracic aorta. Normal caliber pulmonary arteries. No central pulmonary emboli. Right internal jugular MediPort terminates at the junction of the right and left brachiocephalic veins. Mediastinum/Nodes: No discrete thyroid nodules. Unremarkable esophagus. No pathologically enlarged axillary, mediastinal or hilar lymph nodes. Lungs/Pleura: No pneumothorax. No pleural effusion. There is a nodular 2.7 x 1.6 cm focus of consolidation in the medial basilar right lower lobe (series 3/ image 108), previously 1.6 x 1.1 cm. No appreciable change in patchy subpleural reticulation in both lungs without significant traction bronchiectasis or frank honeycombing. Mild centrilobular and paraseptal emphysema with mild diffuse bronchial wall thickening. No acute consolidative airspace disease or new significant pulmonary nodules. Musculoskeletal: No aggressive appearing focal osseous lesions. Moderate thoracic spondylosis. Stable chronic  left scapular deformity. CT ABDOMEN PELVIS FINDINGS Hepatobiliary: Normal liver with no liver mass. Cholecystectomy. No biliary ductal dilatation. Pancreas: Normal, with no mass or duct dilation. Spleen: Normal size. No mass. Adrenals/Urinary Tract: Normal adrenals. No hydronephrosis. No renal masses. Normal bladder. Stomach/Bowel: Grossly normal stomach. Normal caliber small bowel with no small bowel wall thickening. Stable appearance of the appendix, within normal limits. Stable postsurgical changes from partial distal colectomy with intact appearing distal colonic anastomosis. Oral contrast transits to the cecum. No large bowel wall thickening or new pericholecystic fat stranding . Vascular/Lymphatic: Atherosclerotic nonaneurysmal abdominal aorta. Patent portal, splenic, hepatic and renal veins. No pathologically enlarged lymph nodes in the abdomen or pelvis. Reproductive:  Normal size prostate. Other: No pneumoperitoneum, ascites or focal fluid collection. Stable subcutaneous calcified granulomas throughout the left lower back and gluteal regions. Musculoskeletal: No aggressive appearing focal osseous lesions. Marked lumbar spondylosis. IMPRESSION: 1. Nodular focus of consolidation in the medial basilar right lower lobe is increased in size. While this may represent evolving masslike radiation fibrosis, recurrence of the lung metastasis is not excluded. PET-CT would be useful for further evaluation. Otherwise, continued close chest CT surveillance is advised . 2. No additional potential new sites of metastatic disease in the chest, abdomen or pelvis. 3. No evidence of local tumor recurrence at the distal colonic anastomosis. 4. Left main and 3 vessel coronary atherosclerosis . 5. Aortic Atherosclerosis (ICD10-I70.0) and Emphysema (ICD10-J43.9). Electronically Signed   By: Ilona Sorrel M.D.   On: 11/17/2016 13:41   Ct Abdomen Pelvis W Contrast  Result Date: 11/17/2016 CLINICAL DATA:  Stage IV colon cancer  originally diagnosed in August 2014 status post partial colectomy, with right lower lobe lung metastasis diagnosed August 2017 status post SBRT completed 10/22/2015. Restaging. EXAM: CT CHEST, ABDOMEN, AND PELVIS WITH CONTRAST TECHNIQUE: Multidetector CT imaging of the chest, abdomen and pelvis was performed following the standard protocol during bolus administration of intravenous contrast. CONTRAST:  116mL ISOVUE-300 IOPAMIDOL (ISOVUE-300) INJECTION 61% COMPARISON:  06/30/2016 CT chest, abdomen and pelvis. FINDINGS: CT CHEST FINDINGS Cardiovascular: Normal heart size. No significant pericardial fluid/thickening. Left main, left anterior descending, left circumflex and right coronary atherosclerosis. Atherosclerotic nonaneurysmal thoracic aorta. Normal caliber pulmonary arteries. No central pulmonary emboli. Right internal jugular MediPort terminates at the junction of the right and left brachiocephalic veins. Mediastinum/Nodes: No discrete thyroid nodules.  Unremarkable esophagus. No pathologically enlarged axillary, mediastinal or hilar lymph nodes. Lungs/Pleura: No pneumothorax. No pleural effusion. There is a nodular 2.7 x 1.6 cm focus of consolidation in the medial basilar right lower lobe (series 3/ image 108), previously 1.6 x 1.1 cm. No appreciable change in patchy subpleural reticulation in both lungs without significant traction bronchiectasis or frank honeycombing. Mild centrilobular and paraseptal emphysema with mild diffuse bronchial wall thickening. No acute consolidative airspace disease or new significant pulmonary nodules. Musculoskeletal: No aggressive appearing focal osseous lesions. Moderate thoracic spondylosis. Stable chronic left scapular deformity. CT ABDOMEN PELVIS FINDINGS Hepatobiliary: Normal liver with no liver mass. Cholecystectomy. No biliary ductal dilatation. Pancreas: Normal, with no mass or duct dilation. Spleen: Normal size. No mass. Adrenals/Urinary Tract: Normal adrenals. No  hydronephrosis. No renal masses. Normal bladder. Stomach/Bowel: Grossly normal stomach. Normal caliber small bowel with no small bowel wall thickening. Stable appearance of the appendix, within normal limits. Stable postsurgical changes from partial distal colectomy with intact appearing distal colonic anastomosis. Oral contrast transits to the cecum. No large bowel wall thickening or new pericholecystic fat stranding . Vascular/Lymphatic: Atherosclerotic nonaneurysmal abdominal aorta. Patent portal, splenic, hepatic and renal veins. No pathologically enlarged lymph nodes in the abdomen or pelvis. Reproductive:  Normal size prostate. Other: No pneumoperitoneum, ascites or focal fluid collection. Stable subcutaneous calcified granulomas throughout the left lower back and gluteal regions. Musculoskeletal: No aggressive appearing focal osseous lesions. Marked lumbar spondylosis. IMPRESSION: 1. Nodular focus of consolidation in the medial basilar right lower lobe is increased in size. While this may represent evolving masslike radiation fibrosis, recurrence of the lung metastasis is not excluded. PET-CT would be useful for further evaluation. Otherwise, continued close chest CT surveillance is advised . 2. No additional potential new sites of metastatic disease in the chest, abdomen or pelvis. 3. No evidence of local tumor recurrence at the distal colonic anastomosis. 4. Left main and 3 vessel coronary atherosclerosis . 5. Aortic Atherosclerosis (ICD10-I70.0) and Emphysema (ICD10-J43.9). Electronically Signed   By: Ilona Sorrel M.D.   On: 11/17/2016 13:41   Nm Pet Image Restag (ps) Skull Base To Thigh  Result Date: 11/30/2016 CLINICAL DATA:  Subsequent treatment strategy for metastatic colon cancer. EXAM: NUCLEAR MEDICINE PET SKULL BASE TO THIGH TECHNIQUE: 13.8 mCi F-18 FDG was injected intravenously. Full-ring PET imaging was performed from the skull base to thigh after the radiotracer. CT data was obtained and  used for attenuation correction and anatomic localization. FASTING BLOOD GLUCOSE:  Value: 165 mg/dl COMPARISON:  PET-CT 07/01/2015 and CT scans 11/17/2016. FINDINGS: NECK: No hypermetabolic lymph nodes in the neck. CHEST: No hypermetabolic mediastinal or hilar nodes. 26 x 16 mm right lower lobe nodular lesion is hypermetabolic with SUV max of 6.1. This was also metabolically active on the prior examination with SUV max of 5.2. It measured 18 x 12 mm on the prior study. No new pulmonary lesions.  No acute pulmonary findings. Stable advanced three-vessel coronary artery calcifications. ABDOMEN/PELVIS: No abnormal hypermetabolic activity within the liver, pancreas, adrenal glands, or spleen. No hypermetabolic lymph nodes in the abdomen or pelvis. SKELETON: No focal hypermetabolic activity to suggest skeletal metastasis. IMPRESSION: 1. Enlarging right lower lobe pulmonary nodule and slight increase in the SUV max suggesting residual neoplasm. No new pulmonary lesions. 2. No findings for local recurrence or abdominal/pelvic metastatic disease. Electronically Signed   By: Marijo Sanes M.D.   On: 11/30/2016 15:01    Impression/Plan: 1. Recurrent Stage II (T3N0M0)- high risk- adenocarcinoma of colon with metastatic  disease to the right lower lobe of the lung. The patient is counseled on the radiographic findings of persistent/residual disease following SBRT. Dr. [name] discusses the data regarding this clinical scenario and reviews the literature that supports consideration of re-irradiation. We discussed that the course of treatment which would be offered would be to pursue additional SBRT over the course of 5 fractions. We discussed the risks, benefits, short, and long term effects of therapy. The patient's tumor is fortunately in a favorable location, and as a result we would anticipate less long term sequela of reirradiating this site. The patient is in agreement. Written consent is obtained and placed in the chart,  a copy was provided to the patient. We will coordinate for simulation later this week.   In a visit lasting 45 minutes, greater than 50% of the time was spent face to face discussing the options and potential consequences of re-irradiation, and coordinating the patient's care.     Carola Rhine, PAC

## 2016-12-15 ENCOUNTER — Ambulatory Visit
Admission: RE | Admit: 2016-12-15 | Discharge: 2016-12-15 | Disposition: A | Discharge status: Home / Self Care | Payer: Medicare Other | Source: Ambulatory Visit | Attending: Radiation Oncology | Admitting: Radiation Oncology

## 2016-12-15 DIAGNOSIS — C3431 Malignant neoplasm of lower lobe, right bronchus or lung: Secondary | ICD-10-CM | POA: Diagnosis not present

## 2016-12-15 DIAGNOSIS — Z86718 Personal history of other venous thrombosis and embolism: Secondary | ICD-10-CM | POA: Diagnosis not present

## 2016-12-15 DIAGNOSIS — Z510 Encounter for antineoplastic radiation therapy: Secondary | ICD-10-CM | POA: Diagnosis not present

## 2016-12-15 DIAGNOSIS — C7801 Secondary malignant neoplasm of right lung: Secondary | ICD-10-CM

## 2016-12-15 DIAGNOSIS — Z85038 Personal history of other malignant neoplasm of large intestine: Secondary | ICD-10-CM | POA: Diagnosis not present

## 2016-12-15 DIAGNOSIS — Z9889 Other specified postprocedural states: Secondary | ICD-10-CM | POA: Diagnosis not present

## 2016-12-15 DIAGNOSIS — E119 Type 2 diabetes mellitus without complications: Secondary | ICD-10-CM | POA: Diagnosis not present

## 2016-12-18 ENCOUNTER — Encounter (HOSPITAL_COMMUNITY): Payer: Self-pay

## 2016-12-18 ENCOUNTER — Encounter (HOSPITAL_COMMUNITY): Payer: Medicare Other | Attending: Oncology

## 2016-12-18 DIAGNOSIS — Z452 Encounter for adjustment and management of vascular access device: Secondary | ICD-10-CM | POA: Diagnosis not present

## 2016-12-18 DIAGNOSIS — C189 Malignant neoplasm of colon, unspecified: Secondary | ICD-10-CM

## 2016-12-18 MED ORDER — HEPARIN SOD (PORK) LOCK FLUSH 100 UNIT/ML IV SOLN
INTRAVENOUS | Status: AC
  Filled 2016-12-18: qty 5

## 2016-12-18 MED ORDER — HEPARIN SOD (PORK) LOCK FLUSH 100 UNIT/ML IV SOLN
500.0000 [IU] | Freq: Once | INTRAVENOUS | Status: AC
  Administered 2016-12-18: 500 [IU] via INTRAVENOUS

## 2016-12-18 MED ORDER — SODIUM CHLORIDE 0.9% FLUSH
10.0000 mL | INTRAVENOUS | Status: DC | PRN
  Administered 2016-12-18: 10 mL via INTRAVENOUS
  Filled 2016-12-18: qty 10

## 2016-12-18 NOTE — Patient Instructions (Signed)
[facility] at [facility] Discharge Instructions  RECOMMENDATIONS MADE BY THE CONSULTANT AND ANY TEST RESULTS WILL BE SENT TO YOUR REFERRING PHYSICIAN.  Portacath flushed per protocol today. Follow-up as scheduled. Call clinic for any questions or concerns  Thank you for choosing [facility] at [facility] to provide your oncology and hematology care.  To afford each patient quality time with our provider, please arrive at least 15 minutes before your scheduled appointment time.    If you have a lab appointment with the Cancer Center please come in thru the  Main Entrance and check in at the main information desk  You need to re-schedule your appointment should you arrive 10 or more minutes late.  We strive to give you quality time with our providers, and arriving late affects you and other patients whose appointments are after yours.  Also, if you no show three or more times for appointments you may be dismissed from the clinic at the providers discretion.     Again, thank you for choosing [facility].  Our hope is that these requests will decrease the amount of time that you wait before being seen by our physicians.       _____________________________________________________________  Should you have questions after your visit to [facility], please contact our office at [phone] between the hours of 8:30 a.m. and 4:30 p.m.  Voicemails left after 4:30 p.m. will not be returned until the following business day.  For prescription refill requests, have your pharmacy contact our office.       Resources For Cancer Patients and their Caregivers ? American Cancer Society: Can assist with transportation, wigs, general needs, runs Look Good Feel Better.        1-888-227-6333 ? Cancer Care: Provides financial assistance, online support groups, medication/co-pay assistance.  1-800-813-HOPE (4673) ? [facility] Assists [place] cancer patients and their families through emotional , educational and financial support.  336-427-4357 ? [place] DSS Where to apply for food stamps, Medicaid and utility assistance. 336-342-1394 ? [facility]: Transportation to medical appointments. 336-347-2287 ? Social Security Administration: May apply for disability if have a Stage IV cancer. 336-342-7796 1-800-772-1213 ? [place] Aging, Disability and Transit Services: Assists with nutrition, care and transit needs. 336-349-2343  Cancer Center Support Programs: @[email or web address]@ > Cancer Support Group  2nd Tuesday of the month 1pm-2pm, Journey Room  > Creative Journey  3rd Tuesday of the month 1130am-1pm, Journey Room  > Look Good Feel Better  1st Wednesday of the month 10am-12 noon, Journey Room (Call American Cancer Society to register 1-800-395-5775)   

## 2016-12-18 NOTE — Progress Notes (Signed)
Gary Nelson tolerated portacath flush well without complaints or incident. Port accessed with 20 gauge needle without blood return noted but flushed easily with 10 ml NS and 5 ml Heparin then de-accessed Pt denied any discomfort and no swelling noted as port was being flushed. VSS Pt discharged via wheelchair in satisfactory condition accompanied by a family member

## 2016-12-22 DIAGNOSIS — Z86718 Personal history of other venous thrombosis and embolism: Secondary | ICD-10-CM | POA: Diagnosis not present

## 2016-12-22 DIAGNOSIS — Z510 Encounter for antineoplastic radiation therapy: Secondary | ICD-10-CM | POA: Diagnosis not present

## 2016-12-22 DIAGNOSIS — Z85038 Personal history of other malignant neoplasm of large intestine: Secondary | ICD-10-CM | POA: Diagnosis not present

## 2016-12-22 DIAGNOSIS — C3431 Malignant neoplasm of lower lobe, right bronchus or lung: Secondary | ICD-10-CM | POA: Diagnosis not present

## 2016-12-22 DIAGNOSIS — Z9889 Other specified postprocedural states: Secondary | ICD-10-CM | POA: Diagnosis not present

## 2016-12-22 DIAGNOSIS — E119 Type 2 diabetes mellitus without complications: Secondary | ICD-10-CM | POA: Diagnosis not present

## 2016-12-22 DIAGNOSIS — C7801 Secondary malignant neoplasm of right lung: Secondary | ICD-10-CM | POA: Diagnosis not present

## 2017-01-01 ENCOUNTER — Ambulatory Visit: Payer: Medicare Other | Admitting: Radiation Oncology

## 2017-01-01 DIAGNOSIS — M25552 Pain in left hip: Secondary | ICD-10-CM | POA: Diagnosis not present

## 2017-01-02 ENCOUNTER — Ambulatory Visit
Admission: RE | Admit: 2017-01-02 | Discharge: 2017-01-02 | Disposition: A | Discharge status: Home / Self Care | Payer: Medicare Other | Source: Ambulatory Visit | Attending: Radiation Oncology | Admitting: Radiation Oncology

## 2017-01-02 DIAGNOSIS — E119 Type 2 diabetes mellitus without complications: Secondary | ICD-10-CM | POA: Diagnosis not present

## 2017-01-02 DIAGNOSIS — Z9889 Other specified postprocedural states: Secondary | ICD-10-CM | POA: Diagnosis not present

## 2017-01-02 DIAGNOSIS — Z510 Encounter for antineoplastic radiation therapy: Secondary | ICD-10-CM | POA: Diagnosis not present

## 2017-01-02 DIAGNOSIS — C7801 Secondary malignant neoplasm of right lung: Secondary | ICD-10-CM | POA: Diagnosis not present

## 2017-01-02 DIAGNOSIS — Z85038 Personal history of other malignant neoplasm of large intestine: Secondary | ICD-10-CM | POA: Diagnosis not present

## 2017-01-02 DIAGNOSIS — Z86718 Personal history of other venous thrombosis and embolism: Secondary | ICD-10-CM | POA: Diagnosis not present

## 2017-01-03 ENCOUNTER — Ambulatory Visit: Payer: Medicare Other | Admitting: Radiation Oncology

## 2017-01-04 ENCOUNTER — Ambulatory Visit
Admission: RE | Admit: 2017-01-04 | Discharge: 2017-01-04 | Disposition: A | Discharge status: Home / Self Care | Payer: Medicare Other | Source: Ambulatory Visit | Attending: Radiation Oncology | Admitting: Radiation Oncology

## 2017-01-04 DIAGNOSIS — Z9889 Other specified postprocedural states: Secondary | ICD-10-CM | POA: Diagnosis not present

## 2017-01-04 DIAGNOSIS — Z510 Encounter for antineoplastic radiation therapy: Secondary | ICD-10-CM | POA: Diagnosis not present

## 2017-01-04 DIAGNOSIS — E119 Type 2 diabetes mellitus without complications: Secondary | ICD-10-CM | POA: Diagnosis not present

## 2017-01-04 DIAGNOSIS — C7801 Secondary malignant neoplasm of right lung: Secondary | ICD-10-CM | POA: Diagnosis not present

## 2017-01-04 DIAGNOSIS — Z85038 Personal history of other malignant neoplasm of large intestine: Secondary | ICD-10-CM | POA: Diagnosis not present

## 2017-01-04 DIAGNOSIS — Z86718 Personal history of other venous thrombosis and embolism: Secondary | ICD-10-CM | POA: Diagnosis not present

## 2017-01-05 ENCOUNTER — Ambulatory Visit: Payer: Medicare Other | Admitting: Radiation Oncology

## 2017-01-08 ENCOUNTER — Ambulatory Visit
Admission: RE | Admit: 2017-01-08 | Discharge: 2017-01-08 | Disposition: A | Discharge status: Home / Self Care | Payer: Medicare Other | Source: Ambulatory Visit | Attending: Radiation Oncology | Admitting: Radiation Oncology

## 2017-01-08 DIAGNOSIS — E119 Type 2 diabetes mellitus without complications: Secondary | ICD-10-CM | POA: Diagnosis not present

## 2017-01-08 DIAGNOSIS — Z9889 Other specified postprocedural states: Secondary | ICD-10-CM | POA: Diagnosis not present

## 2017-01-08 DIAGNOSIS — Z510 Encounter for antineoplastic radiation therapy: Secondary | ICD-10-CM | POA: Diagnosis not present

## 2017-01-08 DIAGNOSIS — Z86718 Personal history of other venous thrombosis and embolism: Secondary | ICD-10-CM | POA: Diagnosis not present

## 2017-01-08 DIAGNOSIS — Z85038 Personal history of other malignant neoplasm of large intestine: Secondary | ICD-10-CM | POA: Diagnosis not present

## 2017-01-08 DIAGNOSIS — C7801 Secondary malignant neoplasm of right lung: Secondary | ICD-10-CM | POA: Diagnosis not present

## 2017-01-10 ENCOUNTER — Ambulatory Visit
Admission: RE | Admit: 2017-01-10 | Discharge: 2017-01-10 | Disposition: A | Discharge status: Home / Self Care | Payer: Medicare Other | Source: Ambulatory Visit | Attending: Radiation Oncology | Admitting: Radiation Oncology

## 2017-01-10 DIAGNOSIS — Z86718 Personal history of other venous thrombosis and embolism: Secondary | ICD-10-CM | POA: Diagnosis not present

## 2017-01-10 DIAGNOSIS — Z85038 Personal history of other malignant neoplasm of large intestine: Secondary | ICD-10-CM | POA: Diagnosis not present

## 2017-01-10 DIAGNOSIS — E119 Type 2 diabetes mellitus without complications: Secondary | ICD-10-CM | POA: Diagnosis not present

## 2017-01-10 DIAGNOSIS — Z510 Encounter for antineoplastic radiation therapy: Secondary | ICD-10-CM | POA: Diagnosis not present

## 2017-01-10 DIAGNOSIS — Z9889 Other specified postprocedural states: Secondary | ICD-10-CM | POA: Diagnosis not present

## 2017-01-10 DIAGNOSIS — C7801 Secondary malignant neoplasm of right lung: Secondary | ICD-10-CM | POA: Diagnosis not present

## 2017-01-12 ENCOUNTER — Ambulatory Visit
Admission: RE | Admit: 2017-01-12 | Discharge: 2017-01-12 | Disposition: A | Discharge status: Home / Self Care | Payer: Medicare Other | Source: Ambulatory Visit | Attending: Radiation Oncology | Admitting: Radiation Oncology

## 2017-01-12 ENCOUNTER — Encounter: Payer: Self-pay | Admitting: Radiation Oncology

## 2017-01-12 DIAGNOSIS — Z9889 Other specified postprocedural states: Secondary | ICD-10-CM | POA: Diagnosis not present

## 2017-01-12 DIAGNOSIS — Z86718 Personal history of other venous thrombosis and embolism: Secondary | ICD-10-CM | POA: Diagnosis not present

## 2017-01-12 DIAGNOSIS — C7801 Secondary malignant neoplasm of right lung: Secondary | ICD-10-CM | POA: Diagnosis not present

## 2017-01-12 DIAGNOSIS — Z510 Encounter for antineoplastic radiation therapy: Secondary | ICD-10-CM | POA: Diagnosis not present

## 2017-01-12 DIAGNOSIS — C3431 Malignant neoplasm of lower lobe, right bronchus or lung: Secondary | ICD-10-CM | POA: Diagnosis not present

## 2017-01-12 DIAGNOSIS — Z85038 Personal history of other malignant neoplasm of large intestine: Secondary | ICD-10-CM | POA: Diagnosis not present

## 2017-01-12 DIAGNOSIS — E119 Type 2 diabetes mellitus without complications: Secondary | ICD-10-CM | POA: Diagnosis not present

## 2017-01-12 NOTE — Progress Notes (Addendum)
  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 The patient received 50 Gy In 5 fractions at 10 Gy per fraction.  Narrative: The patient tolerated radiation treatment relatively well.   The patient did not have any signs of acute toxicity during treatment.  Plan: The patient has completed radiation treatment. The patient will return to radiation oncology clinic for routine followup in one month. I advised the patient to call or return sooner if they have any questions or concerns related to their recovery or treatment.   ------------------------------------------------  Jodelle Gross, MD, PhD  This document serves as a record of services personally performed by Kyung Rudd, MD. It was created on his behalf by Rae Lips, a trained medical scribe. The creation of this record is based on the scribe's personal observations and the provider's statements to them. This document has been checked and approved by the attending provider.

## 2017-01-15 ENCOUNTER — Ambulatory Visit (HOSPITAL_COMMUNITY): Payer: Medicare Other

## 2017-01-18 ENCOUNTER — Encounter: Payer: Self-pay | Admitting: Radiation Oncology

## 2017-01-18 ENCOUNTER — Ambulatory Visit (HOSPITAL_COMMUNITY): Payer: Medicare Other

## 2017-01-19 ENCOUNTER — Ambulatory Visit (HOSPITAL_COMMUNITY): Payer: Medicare Other

## 2017-01-19 NOTE — Progress Notes (Signed)
  Radiation Oncology         [phone]) [phone] ________________________________  Name: Gary Nelson MRN: [id number]  Date: 12/15/2016  DOB: March 30, 1940  RESPIRATORY MOTION MANAGEMENT SIMULATION  NARRATIVE:  In order to account for effect of respiratory motion on target structures and other organs in the planning and delivery of radiotherapy, this patient underwent respiratory motion management simulation.  To accomplish this, when the patient was brought to the CT simulation planning suite, 4D respiratoy motion management CT images were obtained.  The CT images were loaded into the planning software.  Then, using a variety of tools including Cine, MIP, and standard views, the target volume and planning target volumes (PTV) were delineated.  Avoidance structures were contoured.  Treatment planning then occurred.  Dose volume histograms were generated and reviewed for each of the requested structure.  The resulting plan was carefully reviewed and approved today.  ------------------------------------------------  Jodelle Gross, MD, PhD

## 2017-01-19 NOTE — Progress Notes (Signed)
[facility] Simulation and Treatment Planning Note   Name:  MERIL DRAY MRN: [id number]   Date: 01/19/2017  DOB: 07-Mar-1940  Status:outpatient    DIAGNOSIS:    ICD-10-CM   1. 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   NARRATIVE:The patient was brought to the [facility].  Identity was confirmed.  All relevant records and images related to the planned course of therapy were reviewed.  Then, the patient was positioned in a stable reproducible clinical set-up for radiation therapy. Abdominal compression was applied by me.  4D CT images were obtained and reproducible breathing pattern was confirmed. Free breathing CT images were obtained.  Skin markings were placed.  The CT images were loaded into the planning software where the target and avoidance structures were contoured.  The radiation prescription was entered and confirmed.    TREATMENT PLANNING NOTE:  Treatment planning then occurred. I have requested : MLC's, isodose plan, basic dose calculation.  3 dimensional simulation is performed and dose volume histogram of the gross tumor volume, planning tumor volume and criticial normal structures including the spinal cord and lungs were analyzed and requested.  Special treatment procedure was performed due to high dose per fraction.  The patient will be monitored for increased risk of toxicity.  Daily imaging using cone beam CT will be used for target localization.  I anticipate that the patient will receive 50 Gy in 5 fractions to target volume. Further adjustments will be made based on the planning process is necessary.  ------------------------------------------------  Jodelle Gross, MD, PhD

## 2017-02-01 ENCOUNTER — Ambulatory Visit (INDEPENDENT_AMBULATORY_CARE_PROVIDER_SITE_OTHER): Payer: Medicare Other | Admitting: Otolaryngology

## 2017-02-01 DIAGNOSIS — K219 Gastro-esophageal reflux disease without esophagitis: Secondary | ICD-10-CM | POA: Diagnosis not present

## 2017-02-01 DIAGNOSIS — R070 Pain in throat: Secondary | ICD-10-CM

## 2017-02-05 ENCOUNTER — Encounter (HOSPITAL_COMMUNITY): Payer: Self-pay | Admitting: Oncology

## 2017-02-05 ENCOUNTER — Encounter (HOSPITAL_COMMUNITY): Payer: Medicare Other | Attending: Oncology | Admitting: Oncology

## 2017-02-05 ENCOUNTER — Encounter (HOSPITAL_COMMUNITY): Payer: Medicare Other | Attending: Hematology and Oncology

## 2017-02-05 VITALS — BP 158/55 | HR 92 | Temp 97.8°F | Resp 18 | Wt 272.0 lb

## 2017-02-05 DIAGNOSIS — C189 Malignant neoplasm of colon, unspecified: Secondary | ICD-10-CM

## 2017-02-05 DIAGNOSIS — C7801 Secondary malignant neoplasm of right lung: Secondary | ICD-10-CM

## 2017-02-05 DIAGNOSIS — M25559 Pain in unspecified hip: Secondary | ICD-10-CM

## 2017-02-05 DIAGNOSIS — G620 Drug-induced polyneuropathy: Secondary | ICD-10-CM | POA: Diagnosis not present

## 2017-02-05 MED ORDER — SODIUM CHLORIDE 0.9% FLUSH
10.0000 mL | INTRAVENOUS | Status: DC | PRN
  Administered 2017-02-05: 10 mL
  Filled 2017-02-05: qty 10

## 2017-02-05 MED ORDER — HEPARIN SOD (PORK) LOCK FLUSH 100 UNIT/ML IV SOLN
500.0000 [IU] | Freq: Once | INTRAVENOUS | Status: AC
  Administered 2017-02-05: 500 [IU] via INTRAVENOUS

## 2017-02-05 NOTE — Progress Notes (Signed)
Gary Nelson presented for Portacath access and flush. Portacath located right chest wall accessed with  H 20 needle. No blood return and no resistance met Portacath flushed with 50m NS and 500U/513mHeparin and needle removed intact. Procedure without incident. Patient tolerated procedure well.  Treatment given per orders. Patient tolerated it well without problems. Vitals stable and discharged home from clinic ambulatory. Follow up as scheduled.

## 2017-02-05 NOTE — Progress Notes (Signed)
Gary Nelson., MD [address] [place] [address]  Malignant neoplasm metastatic to right lung [facility]) - Plan: CT Abdomen Pelvis W Contrast, I-Stat Creatinine  CURRENT THERAPY: Surveillance per NCCN guidelines.  INTERVAL HISTORY: Gary Nelson 76 y.o. 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 [name] followed by 6 months worth of adjuvant chemotherapy consisting of FOLFOX with a change in treatment to infusional 5-FU for progressive peripheral neuropathy to finish 6 months worth of treatment by Dr. [name].  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 [name]. PET scan from 11/30/16 showed enlarging right lower lobe pulmonary nodule and slight increase in the SUV max suggesting residual neoplasm. SBRT with Dr. [name] from 01/02/17-01/12/17.     Adenocarcinoma of colon ([place])   10/04/2012 Definitive Surgery    Partial collectomy by Dr. [name]      10/04/2012 Pathology Results    4.5 cm poorly differentiated adenocarcinoma, negative margins, 0/7 lymph nodes for metastatic disease.  Oncotype recurrence score of 26 (high)       Chemotherapy    FOLFOX       Adverse Reaction    Progressive peripheral neuropathy       Chemotherapy    Infusional 5 FU.  Completed 6 months worth of adjuvant therapy.      02/23/2014 Procedure    Colonoscopy by Dr. Anthony Sar.      07/01/2015 PET scan    There is malignant range uptake with RLL pulmonary nodule suspicious for metastatic disease or primary pulm neoplasm. Nonspecific focus of uptake in the L femoral neck. No corresponding CT abnormality. Cannot rule out metastasis.      07/06/2015 Imaging    MR C-spine- C5-6 there is a broad-based  disc bulge w R paracentral disc protrusion deforming the spinal cord. Moderate R foraminal stenosis. C6-7 there is a central disc protrusion slightly eccentric towards the R and mildly deforming the ventral c-spine       09/16/2015 Procedure    RLL needle biopsy by IR.      09/17/2015 Pathology Results    Metastatic adenocarcinoma consistent with a primary colorectal adenocarcinoma.      10/12/2015 - 10/22/2015 Radiation Therapy    SBRT by Dr. [name] to RLL pulmonary lesion (biopsy proven to be oligometastasis).      12/09/2015 Imaging    CT chest- 1. Slight interval decrease in size of the right lower lobe pulmonary nodule. Some surrounding radiation changes. 2. No mediastinal or hilar mass or adenopathy. 3. No new pulmonary nodules. 4. Stable underline emphysematous changes. 5. Cirrhotic changes involving the liver but no upper abdominal metastatic disease.      03/14/2016 Imaging    CT chest- 1. Continued mild reduction in the size of the treated right lobe pulmonary nodule. 2. Increased patchy consolidation, reticulation and ground-glass attenuation in the basilar right lower lobe, nonspecific, favor evolving postradiation change, recommend attention on follow-up chest CT. 3. No evidence of new or progressive metastatic disease in the chest. 4. Aortic atherosclerosis. Left main and 3 vessel coronary atherosclerosis. 5. Mild emphysema.      06/30/2016 Imaging    CT CAP- 1. The appearance of the treated right lower lobe nodule is essentially unchanged,  and there are evolving postradiation changes in the base of the right lower lobe, as above. No definite signs of new metastatic disease are noted elsewhere in the chest, abdomen or pelvis. 2. Aortic atherosclerosis, in addition to left main and 3 vessel coronary artery disease. Assessment for potential risk factor modification, dietary therapy or pharmacologic therapy may be warranted, if clinically indicated. 3. Morphologic  changes in the liver suggestive of cirrhosis, as above. 4. Additional incidental findings, similar prior studies, as above.      11/17/2016 Imaging    CT CAP Study Result   CLINICAL DATA:  Stage IV colon cancer originally diagnosed in August 2014 status post partial colectomy, with right lower lobe lung metastasis diagnosed August 2017 status post SBRT completed 10/22/2015. Restaging.  EXAM: CT CHEST, ABDOMEN, AND PELVIS WITH CONTRAST  TECHNIQUE: Multidetector CT imaging of the chest, abdomen and pelvis was performed following the standard protocol during bolus administration of intravenous contrast.  CONTRAST:  182mL ISOVUE-300 IOPAMIDOL (ISOVUE-300) INJECTION 61%  COMPARISON:  06/30/2016 CT chest, abdomen and pelvis.  FINDINGS: CT CHEST FINDINGS  Cardiovascular: Normal heart size. No significant pericardial fluid/thickening. Left main, left anterior descending, left circumflex and right coronary atherosclerosis. Atherosclerotic nonaneurysmal thoracic aorta. Normal caliber pulmonary arteries. No central pulmonary emboli. Right internal jugular MediPort terminates at the junction of the right and left brachiocephalic veins.  Mediastinum/Nodes: No discrete thyroid nodules. Unremarkable esophagus. No pathologically enlarged axillary, mediastinal or hilar lymph nodes.  Lungs/Pleura: No pneumothorax. No pleural effusion. There is a nodular 2.7 x 1.6 cm focus of consolidation in the medial basilar right lower lobe (series 3/ image 108), previously 1.6 x 1.1 cm. No appreciable change in patchy subpleural reticulation in both lungs without significant traction bronchiectasis or frank honeycombing. Mild centrilobular and paraseptal emphysema with mild diffuse bronchial wall thickening. No acute consolidative airspace disease or new significant pulmonary nodules.  Musculoskeletal: No aggressive appearing focal osseous lesions. Moderate thoracic spondylosis. Stable  chronic left scapular deformity.  CT ABDOMEN PELVIS FINDINGS  Hepatobiliary: Normal liver with no liver mass. Cholecystectomy. No biliary ductal dilatation.  Pancreas: Normal, with no mass or duct dilation.  Spleen: Normal size. No mass.  Adrenals/Urinary Tract: Normal adrenals. No hydronephrosis. No renal masses. Normal bladder.  Stomach/Bowel: Grossly normal stomach. Normal caliber small bowel with no small bowel wall thickening. Stable appearance of the appendix, within normal limits. Stable postsurgical changes from partial distal colectomy with intact appearing distal colonic anastomosis. Oral contrast transits to the cecum. No large bowel wall thickening or new pericholecystic fat stranding .  Vascular/Lymphatic: Atherosclerotic nonaneurysmal abdominal aorta. Patent portal, splenic, hepatic and renal veins. No pathologically enlarged lymph nodes in the abdomen or pelvis.  Reproductive:  Normal size prostate.  Other: No pneumoperitoneum, ascites or focal fluid collection. Stable subcutaneous calcified granulomas throughout the left lower back and gluteal regions.  Musculoskeletal: No aggressive appearing focal osseous lesions. Marked lumbar spondylosis.  IMPRESSION: 1. Nodular focus of consolidation in the medial basilar right lower lobe is increased in size. While this may represent evolving masslike radiation fibrosis, recurrence of the lung metastasis is not excluded. PET-CT would be useful for further evaluation. Otherwise, continued close chest CT surveillance is advised . 2. No additional potential new sites of metastatic disease in the chest, abdomen or pelvis. 3. No evidence of local tumor recurrence at the distal colonic anastomosis. 4. Left main and 3 vessel coronary atherosclerosis . 5. Aortic Atherosclerosis (ICD10-I70.0) and Emphysema (ICD10-J43.9).         11/30/2016  Relapse/Recurrence    PET: IMPRESSION: 1. Enlarging right lower  lobe pulmonary nodule and slight increase in the SUV max suggesting residual neoplasm. No new pulmonary lesions. 2. No findings for local recurrence or abdominal/pelvic metastatic disease.      01/02/2017 - 01/12/2017 Radiation Therapy     The patient saw Dr. [name] for SBRT radiation treatment. The tumor in the right lung was treated with a course of stereotactic body radiation treatment. The patient received 50 Gy In 5 fractions at 10 Gy per fraction.          Patient presents today for follow-up.  Patient states he is feeling well post SBRT which was completed on 01/12/2017 by Dr. [name].  He continues to complain of chronic fatigue and uses a wheelchair and walker to get around at home.  He has chronic shortness of breath and has not noticed a change in his breathing since radiation.  Complains of bilateral leg swelling that is relieved with elevation and continued trouble swallowing due to hiatal hernia and GERD like symptoms.  He states "as long as I chew my food I do not have any problems".  He complains of dermatitis between bilateral groin skin folds.  He continues to have numbness and tingling in bilateral feet.  He has noticed easy bruising.  PET scan performed on 11/30/2016  showed that the right lower lobe nodular lesion is hypermetabolic with SUV max of 6.1 and it had increased in size from 18 x 12 mm on the prior study to 26 x 16 mm on this study.    Review of Systems  Constitutional: Positive for malaise/fatigue. Negative for chills, fever and weight loss.  HENT: Negative.   Eyes: Negative.   Respiratory: Positive for shortness of breath (chronic). Negative for cough.        Chronic Shortness of Breath especially leaning forward  Cardiovascular: Positive for leg swelling. Negative for chest pain.  Gastrointestinal: Negative.  Negative for blood in stool, constipation, diarrhea, melena, nausea and vomiting.  Genitourinary: Negative.   Musculoskeletal: Positive for joint pain  (hips). Negative for back pain.  Skin: Positive for rash.       Bilateral groin skin folds.  Neurological: Positive for weakness.  Endo/Heme/Allergies: Bruises/bleeds easily.  Psychiatric/Behavioral: Negative.     Past Medical History:  Diagnosis Date  . Adenocarcinoma of colon ([place])    RLL -  Pulmonary nodule (06/2015, concerning for mets vs primary; to see RAD-ONC Dr. [name]); stage 2 adenocarinoma   2014 - Colon Cancer - surgery and chemo  . Blood infection ([place])   . Cystitis   . Diabetes mellitus without complication ([place])    Type II  . DVT (deep venous thrombosis) ([place]) 2014   post op  . Hematuria   . Hypertension   . Neuropathy   . Prostatitis   . Pulmonary nodule   . Sleep apnea     Past Surgical History:  Procedure Laterality Date  . BACK SURGERY    . CATARACT EXTRACTION     left  . CATARACT EXTRACTION W/PHACO Right 04/11/2012   Procedure: CATARACT EXTRACTION PHACO AND INTRAOCULAR LENS PLACEMENT (IOC);  Surgeon: Tonny Branch, MD;  Location: [facility] ORS;  Service: Ophthalmology;  Laterality: Right;  CDE:62.48  . CERVICAL FUSION     c4-c5  . CHOLECYSTECTOMY    . COLON SURGERY  09/29/12   Colon resection for cancer  . COLONOSCOPY    . EYE SURGERY     cataract  . GIVENS CAPSULE  STUDY N/A 03/15/2015   Procedure: GIVENS CAPSULE STUDY;  Surgeon: Rogene Houston, MD;  Location: [facility] END[facility] SUITE;  Service: Endoscopy;  Laterality: N/A;  730  . LEG SURGERY    . ORIF FEMUR FRACTURE     right  . POSTERIOR CERVICAL FUSION/FORAMINOTOMY N/A 07/27/2015   Procedure: Cervical four to Cervical seven Laminectomy with Cervical four to Thoracic one Dorsal internal fixation and fusion;  Surgeon: Kevan Ny Ditty, MD;  Location: [place] [facility] OR[facility];  Service: Neurosurgery;  Laterality: N/A;  C4 to C7 Laminectomy with C4 to T1 Dorsal internal fixation and fusion    Family History  Problem Relation Age of Onset  . Heart disease Father   . Heart attack Father   . Alzheimer's disease Mother     Social  History   Socioeconomic History  . Marital status: Single    Spouse name: None  . Number of children: None  . Years of education: None  . Highest education level: None  Social Needs  . Financial resource strain: None  . Food insecurity - worry: None  . Food insecurity - inability: None  . Transportation needs - medical: None  . Transportation needs - non-medical: None  Occupational History  . None  Tobacco Use  . Smoking status: Former Smoker    Packs/day: 1.00    Years: 55.00    Pack years: 55.00    Types: Cigarettes    Start date: 02/07/1955  . Smokeless tobacco: Never Used  . Tobacco comment: quit 2015  Substance and Sexual Activity  . Alcohol use: Yes    Alcohol/week: 1.8 oz    Types: 3 Cans of beer per week  . Drug use: No  . Sexual activity: Yes    Birth control/protection: None  Other Topics Concern  . None  Social History Narrative  . None     PHYSICAL EXAMINATION  ECOG PERFORMANCE STATUS: 2 - Symptomatic, <50% confined to bed  Vitals:   02/05/17 1013  BP: (!) 158/55  Pulse: 92  Resp: 18  Temp: 97.8 F (36.6 C)  SpO2: 96%    Constitutional: Obese man in wheelchair, and in no distress.   HENT:  Head: Normocephalic and atraumatic.  Mouth/Throat: No oropharyngeal exudate. Mucosa moist. Eyes: Pupils are equal, round, and reactive to light. Conjunctivae are normal. No scleral icterus.  Neck: Normal range of motion. Neck supple. No JVD present.  Cardiovascular: Normal rate, regular rhythm and normal heart sounds.  Exam reveals no gallop and no friction rub.   No murmur heard. Pulmonary/Chest: Effort normal and breath sounds normal. No respiratory distress. No wheezes.No rales.  Abdominal: Soft. Bowel sounds are normal. No distension. There is no tenderness. There is no guarding.  Musculoskeletal:  bilateral lower extremity edema +1 pitting Lymphadenopathy:    No cervical or supraclavicular adenopathy.  Neurological: Alert and oriented to person, place,  and time. No cranial nerve deficit.  Skin: Skin is warm and dry. No erythema. No pallor.  Yeast noted in bilateral groin fold. Psychiatric: Affect and judgment normal.     LABORATORY DATA: CBC    Component Value Date/Time   WBC 7.6 11/14/2016 1045   RBC 3.93 (L) 11/14/2016 1045   HGB 12.5 (L) 11/14/2016 1045   HCT 37.2 (L) 11/14/2016 1045   PLT 229 11/14/2016 1045   MCV 94.7 11/14/2016 1045   MCH 31.8 11/14/2016 1045   MCHC 33.6 11/14/2016 1045   RDW 13.4 11/14/2016 1045   LYMPHSABS 1.7 11/14/2016 1045  MONOABS 0.7 11/14/2016 1045   EOSABS 0.1 11/14/2016 1045   BASOSABS 0.0 11/14/2016 1045      Chemistry      Component Value Date/Time   NA 134 (L) 11/14/2016 1045   K 5.2 (H) 11/14/2016 1045   CL 103 11/14/2016 1045   CO2 24 11/14/2016 1045   BUN 21 (H) 11/14/2016 1045   CREATININE 1.15 11/14/2016 1045      Component Value Date/Time   CALCIUM 8.9 11/14/2016 1045   ALKPHOS 57 11/14/2016 1045   AST 22 11/14/2016 1045   ALT 17 11/14/2016 1045   BILITOT 0.6 11/14/2016 1045     Lab Results  Component Value Date   CEA 3.5 05/10/2016     PENDING LABS:   RADIOGRAPHIC STUDIES:  No results found.   PATHOLOGY:    ASSESSMENT AND PLAN:  Stage IV adenocarcinoma with a positive biopsy for adenocarcinoma of colorectal primary of RLL pulmonary nodule (oligometastasis) with a history of Stage II colorectal adenocarcinoma having finished curative treatment in 2014-2015 consisting of partial colectomy on 10/04/2012 by Dr. [name] followed by 6 months worth of adjuvant chemotherapy consisting of FOLFOX with a change in treatment to infusional 5-FU for progressive peripheral neuropathy to finish 6 months worth of treatment by Dr. [name].  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 [name]. 2nd round of SBRT completed on 01/12/17.   Dispo:  1. Patient has appointment with Dr. [name] at the end of January.  Patient will need repeat  imaging status post SBRT.  Will have patient follow-up with med oncology after CT scans and appointment with Dr. [name] for further treatment planning. 2. Intregenious dermatitis/itching: Patient has Nystatin powder and lotrimin cream at home. Will re-fill in needed. Keep dry. 3. BLE edema: Continue to elevate. TED hose. Does not appear to have a current cardiologist. On several cardiac meds.  4. Shortness of Breath: This appears to be chronic. Oxygen saturation 96%. Clear on assessment. Continue 2 L at night PRN. 5. Dysphagia: This is better per patient.  6. Numbness/burning BLE: Continue Gabapentin. Stable. 7. Easy Bruising: Will add labs.  8. Hip pain: Continue pain medication as prescribed. Encouraged frequent ambulation.   THERAPY PLAN:  NCCN guidelines for surveillance for Colon cancer are as follows (1.2017):  A. Stage I   1. Colonoscopy at year 1    A. If advanced adenoma, repeat in 1 year    B. If no advanced adenoma, repeat in 3 years, and then every 5 years.  B. Stage II, Stage III   1. H+P every 3-6 months x 2 years and then every 6 months for a total of 5 years    2. CEA every 3-6 months x 2 years and then every 6 months for a total of 5 years    3. CT CAP every 6-12 months (category 2B for frequency < 12 months) for a total of 5 years .   4.  Colonoscopy in 1 year except if no preoperative colonoscopy due to obstructing lesion, colonoscopy in 3-6 months.     A. If advanced adenoma, repeat in 1 year    B. If no advanced adenoma, repeat in 3 years, then every 5 years   5. PET/CT scan is not recommended.  C. Stage IV   1. H+P every 3-6 months x 2 years and then every 6 months for a total of 5 years    2. CEA every 3 months x 2 years and  then every 6 months for a total of 3- 5 years    3. CT CAP every 3-6 months (category 2B for frequency < 6 months) x 2 years., then every 6-12 months for a total of 5 years .   4. Colonoscopy in 1 year except if no preoperative colonoscopy due to  obstructing lesion, colonoscopy in 3-6 months.     A. If advanced adenoma, repeat in 1 year    B. If no advanced adenoma, repeat in 3 years, then every 5 years   All questions were answered. The patient knows to call the clinic with any problems, questions or concerns. We can certainly see the patient much sooner if necessary.  This note is electronically signed by: Jacquelin Hawking, NP 02/06/2017 8:21 AM

## 2017-02-23 ENCOUNTER — Ambulatory Visit (HOSPITAL_COMMUNITY)
Admission: RE | Admit: 2017-02-23 | Discharge: 2017-02-23 | Disposition: A | Discharge status: Home / Self Care | Payer: Medicare Other | Source: Ambulatory Visit | Attending: Oncology | Admitting: Oncology

## 2017-02-23 DIAGNOSIS — I2510 Atherosclerotic heart disease of native coronary artery without angina pectoris: Secondary | ICD-10-CM | POA: Insufficient documentation

## 2017-02-23 DIAGNOSIS — C801 Malignant (primary) neoplasm, unspecified: Secondary | ICD-10-CM | POA: Insufficient documentation

## 2017-02-23 DIAGNOSIS — R918 Other nonspecific abnormal finding of lung field: Secondary | ICD-10-CM | POA: Insufficient documentation

## 2017-02-23 DIAGNOSIS — K7460 Unspecified cirrhosis of liver: Secondary | ICD-10-CM | POA: Diagnosis not present

## 2017-02-23 DIAGNOSIS — R911 Solitary pulmonary nodule: Secondary | ICD-10-CM | POA: Diagnosis not present

## 2017-02-23 DIAGNOSIS — I700 Atherosclerosis of aorta: Secondary | ICD-10-CM | POA: Insufficient documentation

## 2017-02-23 DIAGNOSIS — C7801 Secondary malignant neoplasm of right lung: Secondary | ICD-10-CM | POA: Diagnosis not present

## 2017-02-23 DIAGNOSIS — C189 Malignant neoplasm of colon, unspecified: Secondary | ICD-10-CM | POA: Diagnosis not present

## 2017-02-23 LAB — POCT I-STAT CREATININE: Creatinine, Ser: 1.2 mg/dL (ref 0.61–1.24)

## 2017-02-23 MED ORDER — IOPAMIDOL (ISOVUE-300) INJECTION 61%
100.0000 mL | Freq: Once | INTRAVENOUS | Status: AC | PRN
  Administered 2017-02-23: 100 mL via INTRAVENOUS

## 2017-02-27 ENCOUNTER — Encounter: Payer: Self-pay | Admitting: Radiation Oncology

## 2017-02-27 ENCOUNTER — Other Ambulatory Visit: Payer: Self-pay

## 2017-02-27 ENCOUNTER — Ambulatory Visit
Admission: RE | Admit: 2017-02-27 | Discharge: 2017-02-27 | Disposition: A | Discharge status: Home / Self Care | Payer: Medicare Other | Source: Ambulatory Visit | Attending: Radiation Oncology | Admitting: Radiation Oncology

## 2017-02-27 VITALS — BP 147/76 | HR 93 | Temp 97.6°F | Resp 20 | Ht 65.0 in | Wt 269.6 lb

## 2017-02-27 DIAGNOSIS — C7801 Secondary malignant neoplasm of right lung: Secondary | ICD-10-CM

## 2017-02-27 DIAGNOSIS — Z8249 Family history of ischemic heart disease and other diseases of the circulatory system: Secondary | ICD-10-CM | POA: Diagnosis not present

## 2017-02-27 DIAGNOSIS — Z85038 Personal history of other malignant neoplasm of large intestine: Secondary | ICD-10-CM | POA: Diagnosis not present

## 2017-02-27 DIAGNOSIS — Z510 Encounter for antineoplastic radiation therapy: Secondary | ICD-10-CM | POA: Diagnosis not present

## 2017-02-27 DIAGNOSIS — Z9049 Acquired absence of other specified parts of digestive tract: Secondary | ICD-10-CM | POA: Diagnosis not present

## 2017-02-27 DIAGNOSIS — Z9889 Other specified postprocedural states: Secondary | ICD-10-CM | POA: Diagnosis not present

## 2017-02-27 DIAGNOSIS — Z820 Family history of epilepsy and other diseases of the nervous system: Secondary | ICD-10-CM | POA: Diagnosis not present

## 2017-02-27 DIAGNOSIS — Z794 Long term (current) use of insulin: Secondary | ICD-10-CM | POA: Diagnosis not present

## 2017-02-27 DIAGNOSIS — Z87891 Personal history of nicotine dependence: Secondary | ICD-10-CM | POA: Diagnosis not present

## 2017-02-27 DIAGNOSIS — E119 Type 2 diabetes mellitus without complications: Secondary | ICD-10-CM | POA: Diagnosis not present

## 2017-02-27 DIAGNOSIS — Z86718 Personal history of other venous thrombosis and embolism: Secondary | ICD-10-CM | POA: Diagnosis not present

## 2017-02-27 DIAGNOSIS — Z7982 Long term (current) use of aspirin: Secondary | ICD-10-CM | POA: Diagnosis not present

## 2017-02-27 DIAGNOSIS — Z79899 Other long term (current) drug therapy: Secondary | ICD-10-CM | POA: Diagnosis not present

## 2017-02-27 DIAGNOSIS — G4730 Sleep apnea, unspecified: Secondary | ICD-10-CM | POA: Diagnosis not present

## 2017-02-27 DIAGNOSIS — I10 Essential (primary) hypertension: Secondary | ICD-10-CM | POA: Diagnosis not present

## 2017-02-27 NOTE — Progress Notes (Signed)
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 The patient received 50 Gy In 5 fractions at 10 Gy per fraction.    10/12/2015 through 10/22/2015 SBRT: The tumor in the right lungwas treated with a course of stereotactic body radiation treatment. The patient received 50Gy In 41fractions at 10G per fraction.    Narrative:  The patient returns today for routine follow-up. In summary, Gary Nelson is a pleasant 77 y.o. patient who was originally cared for by  Gary Nelson after undergoing definitive surgery by Gary Nelson on 10/04/2012 with subsequent recurrence of disease in a pulmonary nodule. He had a biopsy scheduled however did not pursue this, and a PET scan on 07/01/15 revealed a right lower lobe nodule and nonspecific focus of hypermetabolism in the left femoral neck. He went on to receive SBRT with Gary Nelson. He has been followed with serial imaging and his scan in November 2017, February 2018, and May 2018 were all stable scans in the site of his prior right lower lobe treatment. In October 2018 the lesion was noted to have enlarged to 27 x 16 mm, and had previously been 1.6 x 1.1 cm. He underwent PET scan which revealed hypermetabolism in the area of prior treatment in the RLL with an SUV of 6.1. Prior to treatment in 2017 the SUV had been 5.2. And he proceeded with SBRT to this site and completed treatment about 7 weeks ago.                            On review of systems, the patient states he's doing well. He denies any chest pain, shortness of  breath, fevers, or chills. No other complaints are noted.  ALLERGIES:  has No Known Allergies.  Meds: Current Outpatient Medications  Medication Sig Dispense Refill  . aspirin EC 81 MG tablet Take 81 mg by mouth daily.    . clotrimazole (LOTRIMIN) 1 % cream Apply 1 application topically daily as needed (rash).     . furosemide (LASIX) 20 MG tablet Take 20 mg by mouth daily.    Marland Kitchen gabapentin (NEURONTIN) 300 MG capsule Take 2 capsules (600 mg total) by mouth 3 (three) times daily. (Patient taking differently: Take 600 mg by mouth 2 (two) times daily. ) 180 capsule 2  . insulin NPH (HUMULIN N,NOVOLIN N) 100 UNIT/ML injection Inject 15-30 Units into the skin See admin instructions. 25 units every morning and 12 units in the evening    . lisinopril (PRINIVIL,ZESTRIL) 40 MG tablet Take 40 mg by mouth daily.    . OXYGEN Inhale 2 L into the lungs continuous. At night time only    . potassium chloride SA (K-DUR,KLOR-CON) 20 MEQ tablet Take 10 mEq by mouth daily.     Marland Kitchen acetaminophen (TYLENOL) 500 MG tablet Take 500 mg every 6 (six) hours as needed by mouth.    Marland Kitchen acetaminophen (TYLENOL) 650 MG CR tablet Take 650 mg every 8 (eight) hours as needed by mouth for  pain.     No current facility-administered medications for this encounter.     Physical Findings:  height is 5\' 5"  (1.651 m) and weight is 269 lb 9.6 oz (122.3 kg). His oral temperature is 97.6 F (36.4 C). His blood pressure is 147/76 (abnormal) and his pulse is 93. His respiration is 20 and oxygen saturation is 97%.  Pain Assessment Pain Score: 4  Pain Loc: Hip(Bilateral hips)/10 In general this is a well appearing caucasian male in no acute distress. He's alert and oriented x4 and appropriate throughout the examination. Cardiopulmonary assessment is negative for acute distress and he exhibits normal effort. Chest is clear to auscultation bilaterally.  Lab Findings: Lab Results  Component Value Date   WBC 7.6 11/14/2016   HGB 12.5 (L)  11/14/2016   HCT 37.2 (L) 11/14/2016   MCV 94.7 11/14/2016   PLT 229 11/14/2016     Radiographic Findings: Ct Chest W Contrast  Result Date: 02/23/2017 CLINICAL DATA:  Metastatic colon cancer. Restaging after radiation therapy. Right lower lobe lung metastasis diagnosed in August 2017 with SBRT. EXAM: CT CHEST, ABDOMEN, AND PELVIS WITH CONTRAST TECHNIQUE: Multidetector CT imaging of the chest, abdomen and pelvis was performed following the standard protocol during bolus administration of intravenous contrast. CONTRAST:  161mL ISOVUE-300 IOPAMIDOL (ISOVUE-300) INJECTION 61% COMPARISON:  PET of 11/30/2016. Chest abdomen and pelvic CTs of 11/17/2016. FINDINGS: CT CHEST FINDINGS Cardiovascular: Right-sided Port-A-Cath terminates at the high SVC. Aortic and branch vessel atherosclerosis. Tortuous thoracic aorta. Normal heart size with lipomatous hypertrophy of the interatrial septum. Multivessel coronary artery atherosclerosis. No central pulmonary embolism, on this non-dedicated study. Mediastinum/Nodes: No supraclavicular adenopathy. No mediastinal or hilar adenopathy. Lungs/Pleura: No pleural fluid. Mild centrilobular and paraseptal emphysema. Right lower lobe pleural-based soft tissue density measures 2.8 x 1.6 by 2.5 cm on image 123/series 3 and image 124/series 4. Felt to be similar to 2.7 x 1.6 by 2.5 cm on the prior. Mild surrounding interstitial thickening. Musculoskeletal: No acute osseous abnormality. Suspect remote trauma to the inferior left scapula. Lower cervical and upper thoracic spine fixation. CT ABDOMEN PELVIS FINDINGS Hepatobiliary: Caudate and lateral segment left liver lobe enlargement with mild medial left liver lobe atrophy. No focal liver lesion. Cholecystectomy, without biliary ductal dilatation. Pancreas: Normal, without mass or ductal dilatation. Spleen: Normal in size, without focal abnormality. Adrenals/Urinary Tract: Normal adrenal glands. Too small to characterize left renal  lesions. Normal right kidney, without hydronephrosis . Normal urinary bladder. Stomach/Bowel: Normal stomach, without wall thickening. Scattered colonic diverticula. Normal terminal ileum. Ventral upper pelvic wall laxity containing nonobstructive small bowel, similar. Vascular/Lymphatic: Advanced aortic and branch vessel atherosclerosis. No abdominopelvic adenopathy. Reproductive: Normal prostate. Other: No significant free fluid. No abdominal ascites. A right pericolonic/omental nodule measures 8 mm on image 80/series 2 and is similar to on the most recent CT (when remeasured). This is similar in size back to 06/23/2015, favoring a benign etiology. Musculoskeletal: Heterotopic ossification about the soft tissues lateral to the left hip. Advanced lumbosacral spondylosis. IMPRESSION: CT CHEST IMPRESSION 1. Similar soft tissue density within the subpleural right lower lobe, likely due to treated metastasis. 2. No new sites of metastatic disease in the chest. 3. Coronary artery atherosclerosis. Aortic Atherosclerosis (ICD10-I70.0). CT ABDOMEN AND PELVIS IMPRESSION 1. No acute process or evidence of metastatic disease in the abdomen or pelvis. 2. Mild to moderate cirrhosis. 3. Nodularity within the anterior right abdomen has been present back to 2017, favoring a benign etiology. 4.  Aortic Atherosclerosis (ICD10-I70.0). Electronically Signed  By: Abigail Miyamoto M.D.   On: 02/23/2017 12:57   Ct Abdomen Pelvis W Contrast  Result Date: 02/23/2017 CLINICAL DATA:  Metastatic colon cancer. Restaging after radiation therapy. Right lower lobe lung metastasis diagnosed in August 2017 with SBRT. EXAM: CT CHEST, ABDOMEN, AND PELVIS WITH CONTRAST TECHNIQUE: Multidetector CT imaging of the chest, abdomen and pelvis was performed following the standard protocol during bolus administration of intravenous contrast. CONTRAST:  171mL ISOVUE-300 IOPAMIDOL (ISOVUE-300) INJECTION 61% COMPARISON:  PET of 11/30/2016. Chest abdomen and  pelvic CTs of 11/17/2016. FINDINGS: CT CHEST FINDINGS Cardiovascular: Right-sided Port-A-Cath terminates at the high SVC. Aortic and branch vessel atherosclerosis. Tortuous thoracic aorta. Normal heart size with lipomatous hypertrophy of the interatrial septum. Multivessel coronary artery atherosclerosis. No central pulmonary embolism, on this non-dedicated study. Mediastinum/Nodes: No supraclavicular adenopathy. No mediastinal or hilar adenopathy. Lungs/Pleura: No pleural fluid. Mild centrilobular and paraseptal emphysema. Right lower lobe pleural-based soft tissue density measures 2.8 x 1.6 by 2.5 cm on image 123/series 3 and image 124/series 4. Felt to be similar to 2.7 x 1.6 by 2.5 cm on the prior. Mild surrounding interstitial thickening. Musculoskeletal: No acute osseous abnormality. Suspect remote trauma to the inferior left scapula. Lower cervical and upper thoracic spine fixation. CT ABDOMEN PELVIS FINDINGS Hepatobiliary: Caudate and lateral segment left liver lobe enlargement with mild medial left liver lobe atrophy. No focal liver lesion. Cholecystectomy, without biliary ductal dilatation. Pancreas: Normal, without mass or ductal dilatation. Spleen: Normal in size, without focal abnormality. Adrenals/Urinary Tract: Normal adrenal glands. Too small to characterize left renal lesions. Normal right kidney, without hydronephrosis . Normal urinary bladder. Stomach/Bowel: Normal stomach, without wall thickening. Scattered colonic diverticula. Normal terminal ileum. Ventral upper pelvic wall laxity containing nonobstructive small bowel, similar. Vascular/Lymphatic: Advanced aortic and branch vessel atherosclerosis. No abdominopelvic adenopathy. Reproductive: Normal prostate. Other: No significant free fluid. No abdominal ascites. A right pericolonic/omental nodule measures 8 mm on image 80/series 2 and is similar to on the most recent CT (when remeasured). This is similar in size back to 06/23/2015, favoring a  benign etiology. Musculoskeletal: Heterotopic ossification about the soft tissues lateral to the left hip. Advanced lumbosacral spondylosis. IMPRESSION: CT CHEST IMPRESSION 1. Similar soft tissue density within the subpleural right lower lobe, likely due to treated metastasis. 2. No new sites of metastatic disease in the chest. 3. Coronary artery atherosclerosis. Aortic Atherosclerosis (ICD10-I70.0). CT ABDOMEN AND PELVIS IMPRESSION 1. No acute process or evidence of metastatic disease in the abdomen or pelvis. 2. Mild to moderate cirrhosis. 3. Nodularity within the anterior right abdomen has been present back to 2017, favoring a benign etiology. 4.  Aortic Atherosclerosis (ICD10-I70.0). Electronically Signed   By: Abigail Miyamoto M.D.   On: 02/23/2017 12:57    Impression/Plan: 1.  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 The patient's recent CT imaging was reviewed of the chest, and he will follow up with medical oncology next week for discussion of his abd/pelvic imaging to follow along with his history of colon cancer. He is interested in consolidating his follow up with medical oncology in [place]. I will reach out to the providers there to make sure they're happy with this plan as well.  2. History of metastatic colon cancer. As above he will follow up with medical oncology in [place].        Carola Rhine, PAC

## 2017-03-06 ENCOUNTER — Inpatient Hospital Stay (HOSPITAL_COMMUNITY): Payer: Medicare Other | Attending: Internal Medicine | Admitting: Adult Health

## 2017-03-06 ENCOUNTER — Encounter (HOSPITAL_COMMUNITY): Payer: Self-pay | Admitting: Adult Health

## 2017-03-06 ENCOUNTER — Other Ambulatory Visit: Payer: Self-pay

## 2017-03-06 VITALS — BP 146/52 | HR 96 | Temp 98.2°F | Resp 20 | Ht 65.0 in | Wt 269.0 lb

## 2017-03-06 DIAGNOSIS — G8929 Other chronic pain: Secondary | ICD-10-CM | POA: Diagnosis not present

## 2017-03-06 DIAGNOSIS — Z923 Personal history of irradiation: Secondary | ICD-10-CM | POA: Insufficient documentation

## 2017-03-06 DIAGNOSIS — C7801 Secondary malignant neoplasm of right lung: Secondary | ICD-10-CM

## 2017-03-06 DIAGNOSIS — Z87891 Personal history of nicotine dependence: Secondary | ICD-10-CM | POA: Insufficient documentation

## 2017-03-06 DIAGNOSIS — G620 Drug-induced polyneuropathy: Secondary | ICD-10-CM

## 2017-03-06 DIAGNOSIS — M25559 Pain in unspecified hip: Secondary | ICD-10-CM | POA: Insufficient documentation

## 2017-03-06 DIAGNOSIS — C189 Malignant neoplasm of colon, unspecified: Secondary | ICD-10-CM | POA: Diagnosis not present

## 2017-03-06 NOTE — Progress Notes (Signed)
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 [name]      10/04/2012 Pathology Results    4.5 cm poorly differentiated adenocarcinoma, negative margins, 0/7 lymph nodes for metastatic disease.  Oncotype recurrence score of 26 (high)       Chemotherapy    FOLFOX       Adverse Reaction    Progressive peripheral neuropathy       Chemotherapy    Infusional 5 FU.  Completed 6 months worth of adjuvant therapy.      02/23/2014 Procedure    Colonoscopy by Dr. Anthony Sar.      07/01/2015 PET scan    There is malignant range uptake with RLL pulmonary nodule suspicious for metastatic disease or primary pulm neoplasm. Nonspecific focus of uptake in the L femoral neck. No corresponding CT abnormality. Cannot rule out metastasis.      07/06/2015 Imaging    MR C-spine- C5-6 there is a broad-based disc bulge w R paracentral disc protrusion deforming the spinal cord. Moderate R foraminal stenosis. C6-7 there is a central disc protrusion slightly eccentric towards the R and mildly deforming the ventral c-spine       09/16/2015 Procedure    RLL needle biopsy by IR.      09/17/2015 Pathology Results    Metastatic adenocarcinoma consistent with a primary colorectal adenocarcinoma.      10/12/2015 - 10/22/2015 Radiation Therapy    SBRT by Dr. [name] to RLL pulmonary lesion (biopsy proven to be oligometastasis).      12/09/2015 Imaging    CT chest- 1. Slight interval decrease in size of the right lower lobe pulmonary nodule. Some surrounding radiation changes. 2. No mediastinal or hilar  mass or adenopathy. 3. No new pulmonary nodules. 4. Stable underline emphysematous changes. 5. Cirrhotic changes involving the liver but no upper abdominal metastatic disease.      03/14/2016 Imaging    CT chest- 1. Continued mild reduction in the size of the treated right lobe pulmonary nodule. 2. Increased patchy consolidation, reticulation and ground-glass attenuation in the basilar right lower lobe, nonspecific, favor evolving postradiation change, recommend attention on follow-up chest CT. 3. No evidence of new or progressive metastatic disease in the chest. 4. Aortic atherosclerosis. Left main and 3 vessel coronary atherosclerosis. 5. Mild emphysema.      06/30/2016 Imaging    CT CAP- 1. The appearance of the treated right lower lobe nodule is essentially unchanged, and there are evolving postradiation changes in the base of the right lower lobe, as above. No definite signs of new metastatic disease are noted elsewhere in the chest, abdomen or pelvis. 2. Aortic atherosclerosis, in addition to left main and 3 vessel coronary artery disease. Assessment for potential risk factor modification, dietary therapy or pharmacologic therapy may be warranted, if clinically indicated. 3. Morphologic changes in the liver suggestive of cirrhosis, as above. 4. Additional incidental findings, similar prior studies, as above.      11/17/2016 Imaging    CT CAP Study Result   CLINICAL DATA:  Stage IV colon cancer originally diagnosed in August 2014  status post partial colectomy, with right lower lobe lung metastasis diagnosed August 2017 status post SBRT completed 10/22/2015. Restaging.  EXAM: CT CHEST, ABDOMEN, AND PELVIS WITH CONTRAST  TECHNIQUE: Multidetector CT imaging of the chest, abdomen and pelvis was performed following the standard protocol during bolus administration of intravenous contrast.  CONTRAST:  138mL ISOVUE-300 IOPAMIDOL (ISOVUE-300) INJECTION  61%  COMPARISON:  06/30/2016 CT chest, abdomen and pelvis.  FINDINGS: CT CHEST FINDINGS  Cardiovascular: Normal heart size. No significant pericardial fluid/thickening. Left main, left anterior descending, left circumflex and right coronary atherosclerosis. Atherosclerotic nonaneurysmal thoracic aorta. Normal caliber pulmonary arteries. No central pulmonary emboli. Right internal jugular MediPort terminates at the junction of the right and left brachiocephalic veins.  Mediastinum/Nodes: No discrete thyroid nodules. Unremarkable esophagus. No pathologically enlarged axillary, mediastinal or hilar lymph nodes.  Lungs/Pleura: No pneumothorax. No pleural effusion. There is a nodular 2.7 x 1.6 cm focus of consolidation in the medial basilar right lower lobe (series 3/ image 108), previously 1.6 x 1.1 cm. No appreciable change in patchy subpleural reticulation in both lungs without significant traction bronchiectasis or frank honeycombing. Mild centrilobular and paraseptal emphysema with mild diffuse bronchial wall thickening. No acute consolidative airspace disease or new significant pulmonary nodules.  Musculoskeletal: No aggressive appearing focal osseous lesions. Moderate thoracic spondylosis. Stable chronic left scapular deformity.  CT ABDOMEN PELVIS FINDINGS  Hepatobiliary: Normal liver with no liver mass. Cholecystectomy. No biliary ductal dilatation.  Pancreas: Normal, with no mass or duct dilation.  Spleen: Normal size. No mass.  Adrenals/Urinary Tract: Normal adrenals. No hydronephrosis. No renal masses. Normal bladder.  Stomach/Bowel: Grossly normal stomach. Normal caliber small bowel with no small bowel wall thickening. Stable appearance of the appendix, within normal limits. Stable postsurgical changes from partial distal colectomy with intact appearing distal colonic anastomosis. Oral contrast transits to the cecum. No large bowel wall thickening or  new pericholecystic fat stranding .  Vascular/Lymphatic: Atherosclerotic nonaneurysmal abdominal aorta. Patent portal, splenic, hepatic and renal veins. No pathologically enlarged lymph nodes in the abdomen or pelvis.  Reproductive:  Normal size prostate.  Other: No pneumoperitoneum, ascites or focal fluid collection. Stable subcutaneous calcified granulomas throughout the left lower back and gluteal regions.  Musculoskeletal: No aggressive appearing focal osseous lesions. Marked lumbar spondylosis.  IMPRESSION: 1. Nodular focus of consolidation in the medial basilar right lower lobe is increased in size. While this may represent evolving masslike radiation fibrosis, recurrence of the lung metastasis is not excluded. PET-CT would be useful for further evaluation. Otherwise, continued close chest CT surveillance is advised . 2. No additional potential new sites of metastatic disease in the chest, abdomen or pelvis. 3. No evidence of local tumor recurrence at the distal colonic anastomosis. 4. Left main and 3 vessel coronary atherosclerosis . 5. Aortic Atherosclerosis (ICD10-I70.0) and Emphysema (ICD10-J43.9).         11/30/2016 Relapse/Recurrence    PET: IMPRESSION: 1. Enlarging right lower lobe pulmonary nodule and slight increase in the SUV max suggesting residual neoplasm. No new pulmonary lesions. 2. No findings for local recurrence or abdominal/pelvic metastatic disease.      01/02/2017 - 01/12/2017 Radiation Therapy     The patient saw Dr. [name] for SBRT radiation treatment. The tumor in the right lung was treated with a course of stereotactic body radiation treatment. The patient received 50 Gy In 5 fractions at 10 Gy per fraction.           HISTORY OF PRESENT ILLNESS:  (From Rulon Abide, NP's note on 02/05/17)  INTERVAL HISTORY:  Mr. Cavanagh 77 y.o. male returns for routine follow-up for metastatic lung cancer.   Here today with his  brother.   Overall, he tells me he has been feeling "okay." Appetite 100%; energy levels 50%.  He has chronic bilateral hip pain, for which he takes naproxen; this is managed by an outside provider.    Continues to have chronic shortness of breath, which is largely stable.  He tells me that his breathing is worse whenever he bends forward at the waist.  When asked if he has dyspnea on exertion, he states, "well, I don't do a whole lot of walking." He continues to use O2 at night as needed.  His O2 requirement has not changed or increased. Denies any headaches or dizziness.  Other than his hip pain, denies any other focal bone pain.  He has occasional trouble swallowing; he tells me he has seen Dr. Benjamine Mola with ENT in the past and "he ran that scope down my throat."  Mr. Adebayo tells me that he was "given some medicine for my throat" by Dr. Benjamine Mola.  Denies any choking sensation with swallowing solids or liquids.   He recently had CT scans and would like to review those results together today.     REVIEW OF SYSTEMS:  Review of Systems  Constitutional: Positive for fatigue. Negative for chills and fever.  HENT:   Positive for trouble swallowing.   Eyes: Negative.   Respiratory: Positive for cough and shortness of breath.   Cardiovascular: Positive for leg swelling.  Gastrointestinal: Negative.   Endocrine: Negative.   Genitourinary: Negative.    Musculoskeletal: Positive for arthralgias.  Skin: Positive for rash ("rash under my belly. I've got some cream I put on it." ).  Neurological: Negative.   Hematological: Bruises/bleeds easily.  Psychiatric/Behavioral: Negative.      PAST MEDICAL/SURGICAL HISTORY:  Past Medical History:  Diagnosis Date  . Adenocarcinoma of colon ([place])    RLL -  Pulmonary nodule (06/2015, concerning for mets vs primary; to see [facility] Dr. [name]); stage 2 adenocarinoma   2014 - Colon Cancer - surgery and chemo  . Blood infection ([place])   . Cystitis   . Diabetes mellitus  without complication ([place])    Type II  . DVT (deep venous thrombosis) ([place]) 2014   post op  . Hematuria   . Hypertension   . Neuropathy   . Prostatitis   . Pulmonary nodule   . Sleep apnea    Past Surgical History:  Procedure Laterality Date  . BACK SURGERY    . CATARACT EXTRACTION     left  . CATARACT EXTRACTION W/PHACO Right 04/11/2012   Procedure: CATARACT EXTRACTION PHACO AND INTRAOCULAR LENS PLACEMENT (IOC);  Surgeon: Tonny Branch, MD;  Location: [facility];  Service: Ophthalmology;  Laterality: Right;  CDE:62.48  . CERVICAL FUSION     c4-c5  . CHOLECYSTECTOMY    . COLON SURGERY  09/29/12   Colon resection for cancer  . COLONOSCOPY    . EYE SURGERY     cataract  . GIVENS CAPSULE STUDY N/A 03/15/2015   Procedure: GIVENS CAPSULE STUDY;  Surgeon: Rogene Houston, MD;  Location: [facility] SUITE;  Service: Endoscopy;  Laterality: N/A;  730  . LEG SURGERY    . ORIF FEMUR FRACTURE     right  . POSTERIOR CERVICAL FUSION/FORAMINOTOMY N/A 07/27/2015   Procedure: Cervical four to Cervical seven Laminectomy with Cervical four to Thoracic one Dorsal internal fixation and fusion;  Surgeon: Kevan Ny Ditty, MD;  Location: [facility];  Service: Neurosurgery;  Laterality: N/A;  C4 to C7 Laminectomy with C4 to T1 Dorsal internal fixation and fusion     SOCIAL HISTORY:  Social History   Socioeconomic History  . Marital status: Single    Spouse name: Not on file  . Number of children: Not on file  . Years of education: Not on file  . Highest education level: Not on file  Social Needs  . Financial resource strain: Not on file  . Food insecurity - worry: Not on file  . Food insecurity - inability: Not on file  . Transportation needs - medical: Not on file  . Transportation needs - non-medical: Not on file  Occupational History  . Not on file  Tobacco Use  . Smoking status: Former Smoker    Packs/day: 1.00    Years: 55.00    Pack years: 55.00    Types: Cigarettes    Start date:  02/07/1955  . Smokeless tobacco: Never Used  . Tobacco comment: quit 2015  Substance and Sexual Activity  . Alcohol use: Yes    Alcohol/week: 1.8 oz    Types: 3 Cans of beer per week  . Drug use: No  . Sexual activity: Yes    Birth control/protection: None  Other Topics Concern  . Not on file  Social History Narrative  . Not on file    FAMILY HISTORY:  Family History  Problem Relation Age of Onset  . Heart disease Father   . Heart attack Father   . Alzheimer's disease Mother     CURRENT MEDICATIONS:  Outpatient Encounter Medications as of 03/06/2017  Medication Sig Note  . acetaminophen (TYLENOL) 500 MG tablet Take 500 mg every 6 (six) hours as needed by mouth.   Marland Kitchen acetaminophen (TYLENOL) 650 MG CR tablet Take 650 mg every 8 (eight) hours as needed by mouth for pain.   Marland Kitchen aspirin EC 81 MG tablet Take 81 mg by mouth daily.   . clotrimazole (LOTRIMIN) 1 % cream Apply 1 application topically daily as needed (rash).    . furosemide (LASIX) 20 MG tablet Take 20 mg by mouth daily.   Marland Kitchen gabapentin (NEURONTIN) 300 MG capsule Take 2 capsules (600 mg total) by mouth 3 (three) times daily. (Patient taking differently: Take 600 mg by mouth 2 (two) times daily. )   . insulin NPH (HUMULIN N,NOVOLIN N) 100 UNIT/ML injection Inject 15-30 Units into the skin See admin instructions. 25 units every morning and 12 units in the evening   . lisinopril (PRINIVIL,ZESTRIL) 40 MG tablet Take 40 mg by mouth daily.   . naproxen (NAPROSYN) 500 MG tablet TAKE 1 TABLET BY MOUTH TWICE A DAY WITH MEALS   . nystatin-triamcinolone (MYCOLOG II) cream Apply topically.   . OXYGEN Inhale 2 L into the lungs continuous. At night time only   . potassium chloride SA (K-DUR,KLOR-CON) 20 MEQ tablet Take 10 mEq by mouth daily.  09/14/2015: Not on MAR   No facility-administered encounter medications on file as of 03/06/2017.     ALLERGIES:  No Known Allergies   PHYSICAL EXAM:  ECOG Performance status: 2 - Symptomatic;  requires occasional assistance   Vitals:   03/06/17 0956  BP: (!) 146/52  Pulse: 96  Resp: 20  Temp: 98.2 F (36.8 C)  SpO2: 98%   Filed Weights   03/06/17 0956  Weight: 269 lb (122 kg)    Physical Exam  Constitutional: He  is oriented to person, place, and time.  -Chronically ill appearing male in no acute distress Exam done with patient seated in wheelchair   HENT:  Head: Normocephalic.  Mouth/Throat: Oropharynx is clear and moist. No oropharyngeal exudate.  Eyes: Conjunctivae are normal. Pupils are equal, round, and reactive to light. No scleral icterus.  Neck: Normal range of motion. Neck supple.  Cardiovascular: Normal rate and regular rhythm.  Pulmonary/Chest: Effort normal. No respiratory distress.  Diminished breath sounds bilat bases   Abdominal: Soft. Bowel sounds are normal. There is no tenderness.  Musculoskeletal: He exhibits edema (1+ BLE edema ).  Lymphadenopathy:    He has no cervical adenopathy.       Right: No supraclavicular adenopathy present.       Left: No supraclavicular adenopathy present.  Neurological: He is alert and oriented to person, place, and time. No cranial nerve deficit.  Skin: Skin is warm and dry. No rash noted.  Psychiatric: Mood, memory, affect and judgment normal.  Nursing note and vitals reviewed.    LABORATORY DATA:  I have reviewed the labs as listed.  CBC    Component Value Date/Time   WBC 7.6 11/14/2016 1045   RBC 3.93 (L) 11/14/2016 1045   HGB 12.5 (L) 11/14/2016 1045   HCT 37.2 (L) 11/14/2016 1045   PLT 229 11/14/2016 1045   MCV 94.7 11/14/2016 1045   MCH 31.8 11/14/2016 1045   MCHC 33.6 11/14/2016 1045   RDW 13.4 11/14/2016 1045   LYMPHSABS 1.7 11/14/2016 1045   MONOABS 0.7 11/14/2016 1045   EOSABS 0.1 11/14/2016 1045   BASOSABS 0.0 11/14/2016 1045   CMP Latest Ref Rng & Units 02/23/2017 11/14/2016 08/16/2016  Glucose 65 - 99 mg/dL - 147(H) 102(H)  BUN 6 - 20 mg/dL - 21(H) 26(H)  Creatinine 0.61 - 1.24 mg/dL  1.20 1.15 1.60(H)  Sodium 135 - 145 mmol/L - 134(L) 136  Potassium 3.5 - 5.1 mmol/L - 5.2(H) 5.3(H)  Chloride 101 - 111 mmol/L - 103 105  CO2 22 - 32 mmol/L - 24 26  Calcium 8.9 - 10.3 mg/dL - 8.9 9.3  Total Protein 6.5 - 8.1 g/dL - 6.7 7.0  Total Bilirubin 0.3 - 1.2 mg/dL - 0.6 0.6  Alkaline Phos 38 - 126 U/L - 57 55  AST 15 - 41 U/L - 22 20  ALT 17 - 63 U/L - 17 14(L)    PENDING LABS:    DIAGNOSTIC IMAGING:  *The following radiologic images and reports have been reviewed independently and agree with below findings.  CT chest/abd/pelvis: 02/23/17 CLINICAL DATA:  Metastatic colon cancer. Restaging after radiation therapy. Right lower lobe lung metastasis diagnosed in August 2017 with SBRT.  EXAM: CT CHEST, ABDOMEN, AND PELVIS WITH CONTRAST  TECHNIQUE: Multidetector CT imaging of the chest, abdomen and pelvis was performed following the standard protocol during bolus administration of intravenous contrast.  CONTRAST:  114mL ISOVUE-300 IOPAMIDOL (ISOVUE-300) INJECTION 61%  COMPARISON:  PET of 11/30/2016. Chest abdomen and pelvic CTs of 11/17/2016.  FINDINGS: CT CHEST FINDINGS  Cardiovascular: Right-sided Port-A-Cath terminates at the high SVC. Aortic and branch vessel atherosclerosis. Tortuous thoracic aorta. Normal heart size with lipomatous hypertrophy of the interatrial septum. Multivessel coronary artery atherosclerosis. No central pulmonary embolism, on this non-dedicated study.  Mediastinum/Nodes: No supraclavicular adenopathy. No mediastinal or hilar adenopathy.  Lungs/Pleura: No pleural fluid. Mild centrilobular and paraseptal emphysema.  Right lower lobe pleural-based soft tissue density measures 2.8 x 1.6 by 2.5 cm on image 123/series 3 and image  124/series 4. Felt to be similar to 2.7 x 1.6 by 2.5 cm on the prior. Mild surrounding interstitial thickening.  Musculoskeletal: No acute osseous abnormality. Suspect remote trauma to the inferior  left scapula. Lower cervical and upper thoracic spine fixation.  CT ABDOMEN PELVIS FINDINGS  Hepatobiliary: Caudate and lateral segment left liver lobe enlargement with mild medial left liver lobe atrophy. No focal liver lesion. Cholecystectomy, without biliary ductal dilatation.  Pancreas: Normal, without mass or ductal dilatation.  Spleen: Normal in size, without focal abnormality.  Adrenals/Urinary Tract: Normal adrenal glands. Too small to characterize left renal lesions. Normal right kidney, without hydronephrosis . Normal urinary bladder.  Stomach/Bowel: Normal stomach, without wall thickening. Scattered colonic diverticula. Normal terminal ileum. Ventral upper pelvic wall laxity containing nonobstructive small bowel, similar.  Vascular/Lymphatic: Advanced aortic and branch vessel atherosclerosis. No abdominopelvic adenopathy.  Reproductive: Normal prostate.  Other: No significant free fluid. No abdominal ascites. A right pericolonic/omental nodule measures 8 mm on image 80/series 2 and is similar to on the most recent CT (when remeasured). This is similar in size back to 06/23/2015, favoring a benign etiology.  Musculoskeletal: Heterotopic ossification about the soft tissues lateral to the left hip. Advanced lumbosacral spondylosis.  IMPRESSION: CT CHEST IMPRESSION  1. Similar soft tissue density within the subpleural right lower lobe, likely due to treated metastasis. 2. No new sites of metastatic disease in the chest. 3. Coronary artery atherosclerosis. Aortic Atherosclerosis (ICD10-I70.0).  CT ABDOMEN AND PELVIS IMPRESSION  1. No acute process or evidence of metastatic disease in the abdomen or pelvis. 2. Mild to moderate cirrhosis. 3. Nodularity within the anterior right abdomen has been present back to 2017, favoring a benign etiology. 4.  Aortic Atherosclerosis (ICD10-I70.0).   Electronically Signed   By: Abigail Miyamoto M.D.   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 [name].  In 10/2015, was treated with SBRT for biopsy-proven oligometastatic disease to RLL lung in 10/2015 by Dr. [name].  Subsequent restaging imaging in 11/2016 again demonstrated enlarging RLL pulmonary nodule, concerning for residual disease. Pt was treated again with SBRT to lung from 01/02/17-01/12/17 by Dr. [name].    -He is nearly 2 month's out from last SBRT for oligometastatic lung lesion. Recent restaging CT chest/abd/pelvis on 02/23/17 demonstrated similar soft tissue density within the subpleural right lower lobe, likely due to treated metastasis.  No new sites of metastatic disease in the chest.  No acute process or evidence of metastatic disease in the abdomen or pelvis.  These results were reviewed today in detail with the patient.  We reviewed the actual images together today and he was also provided a copy of the radiologic report.  -CEA trend reviewed and stable; last CEA normal at 4 in 11/2016.  -Clinically, he feels pretty well. No evidence of recurrent or metastatic disease on imaging.  Will plan to obtain restaging imaging with CT chest/abd/pelvis in 3 months. He would like to use his port-a-cath for CT scan IV contrast, which is reasonable; we will help coordinate this for our nurses to access/de-access his port for CT imaging.  -Encouraged him to increase  his activity, as tolerated, to potentially help with his breathing/improve lung volume.  -Return to cancer center in 3 months for follow-up with port flush/labs.   Port-a-cath  maintenance:  -Continue port flush every 8 weeks.       Dispo:  -Restaging CT chest/abd/pelvis in 3 months; orders placed today.  Nursing to use access/de-access port-a-cath for IV contrast for CT scan.  -Return to cancer center in 3 months for follow-up with port flush/labs.  -Continue port flush every 8 weeks.    All questions were answered to patient's stated satisfaction. Encouraged patient to call with any new concerns or questions before his next visit to the cancer center and we can certain see him sooner, if needed.      Orders placed this encounter:  Orders Placed This Encounter  Procedures  . CT Chest W Contrast  . CT Abdomen Pelvis W Contrast  . CBC with Differential/Platelet  . Comprehensive metabolic panel  . [place], NP [place] [phone]

## 2017-03-26 DIAGNOSIS — Z794 Long term (current) use of insulin: Secondary | ICD-10-CM | POA: Diagnosis not present

## 2017-03-26 DIAGNOSIS — Z86718 Personal history of other venous thrombosis and embolism: Secondary | ICD-10-CM | POA: Insufficient documentation

## 2017-03-26 DIAGNOSIS — G4762 Sleep related leg cramps: Secondary | ICD-10-CM | POA: Diagnosis not present

## 2017-03-26 DIAGNOSIS — E118 Type 2 diabetes mellitus with unspecified complications: Secondary | ICD-10-CM | POA: Diagnosis not present

## 2017-04-03 ENCOUNTER — Encounter (HOSPITAL_COMMUNITY): Payer: Self-pay

## 2017-04-03 ENCOUNTER — Other Ambulatory Visit: Payer: Self-pay

## 2017-04-03 ENCOUNTER — Inpatient Hospital Stay (HOSPITAL_COMMUNITY): Payer: Medicare Other | Attending: Internal Medicine

## 2017-04-03 DIAGNOSIS — C189 Malignant neoplasm of colon, unspecified: Secondary | ICD-10-CM | POA: Diagnosis not present

## 2017-04-03 DIAGNOSIS — Z452 Encounter for adjustment and management of vascular access device: Secondary | ICD-10-CM | POA: Insufficient documentation

## 2017-04-03 MED ORDER — HEPARIN SOD (PORK) LOCK FLUSH 100 UNIT/ML IV SOLN
500.0000 [IU] | Freq: Once | INTRAVENOUS | Status: AC
  Administered 2017-04-03: 500 [IU] via INTRAVENOUS

## 2017-04-03 MED ORDER — SODIUM CHLORIDE 0.9% FLUSH
10.0000 mL | INTRAVENOUS | Status: DC | PRN
  Administered 2017-04-03: 10 mL via INTRAVENOUS
  Filled 2017-04-03: qty 10

## 2017-04-03 NOTE — Progress Notes (Signed)
Gary Nelson presented for Portacath access and flush. Portacath located right chest wall accessed with  H 20 needle. Good blood return present. Portacath flushed with 27ml NS and 500U/48ml Heparin and needle removed intact. Procedure without incident. Patient tolerated procedure well.  Labs drawn per orders.  Treatment given per orders. Patient tolerated it well without problems. Vitals stable and discharged home from clinic via wheelchair. Follow up as scheduled.

## 2017-04-03 NOTE — Patient Instructions (Signed)
[facility] at [facility] Discharge Instructions  RECOMMENDATIONS MADE BY THE CONSULTANT AND ANY TEST RESULTS WILL BE SENT TO YOUR REFERRING PHYSICIAN.  Port flush done with labs Follow up as scheduled.  Thank you for choosing [facility] at [facility] to provide your oncology and hematology care.  To afford each patient quality time with our provider, please arrive at least 15 minutes before your scheduled appointment time.    If you have a lab appointment with the Cancer Center please come in thru the  Main Entrance and check in at the main information desk  You need to re-schedule your appointment should you arrive 10 or more minutes late.  We strive to give you quality time with our providers, and arriving late affects you and other patients whose appointments are after yours.  Also, if you no show three or more times for appointments you may be dismissed from the clinic at the providers discretion.     Again, thank you for choosing [facility].  Our hope is that these requests will decrease the amount of time that you wait before being seen by our physicians.       _____________________________________________________________  Should you have questions after your visit to [facility], please contact our office at [phone] between the hours of 8:30 a.m. and 4:30 p.m.  Voicemails left after 4:30 p.m. will not be returned until the following business day.  For prescription refill requests, have your pharmacy contact our office.       Resources For Cancer Patients and their Caregivers ? American Cancer Society: Can assist with transportation, wigs, general needs, runs Look Good Feel Better.        1-888-227-6333 ? Cancer Care: Provides financial assistance, online support groups, medication/co-pay assistance.  1-800-813-HOPE (4673) ? [facility] Assists [place] cancer patients  and their families through emotional , educational and financial support.  336-427-4357 ? [place] DSS Where to apply for food stamps, Medicaid and utility assistance. 336-342-1394 ? [facility]: Transportation to medical appointments. 336-347-2287 ? Social Security Administration: May apply for disability if have a Stage IV cancer. 336-342-7796 1-800-772-1213 ? [place] Aging, Disability and Transit Services: Assists with nutrition, care and transit needs. 336-349-2343  Cancer Center Support Programs: [email or web address]@ > Cancer Support Group  2nd Tuesday of the month 1pm-2pm, Journey Room  > Creative Journey  3rd Tuesday of the month 1130am-1pm, Journey Room  > Look Good Feel Better  1st Wednesday of the month 10am-12 noon, Journey Room (Call American Cancer Society to register 1-800-395-5775)   

## 2017-04-05 ENCOUNTER — Ambulatory Visit (INDEPENDENT_AMBULATORY_CARE_PROVIDER_SITE_OTHER): Payer: Medicare Other | Admitting: Otolaryngology

## 2017-04-05 DIAGNOSIS — R070 Pain in throat: Secondary | ICD-10-CM

## 2017-04-05 DIAGNOSIS — K219 Gastro-esophageal reflux disease without esophagitis: Secondary | ICD-10-CM

## 2017-04-05 DIAGNOSIS — R1312 Dysphagia, oropharyngeal phase: Secondary | ICD-10-CM | POA: Diagnosis not present

## 2017-04-17 ENCOUNTER — Encounter (HOSPITAL_COMMUNITY): Payer: Medicare Other

## 2017-04-20 ENCOUNTER — Encounter (HOSPITAL_COMMUNITY): Payer: Medicare Other

## 2017-05-03 DIAGNOSIS — M79676 Pain in unspecified toe(s): Secondary | ICD-10-CM | POA: Diagnosis not present

## 2017-05-03 DIAGNOSIS — L84 Corns and callosities: Secondary | ICD-10-CM | POA: Diagnosis not present

## 2017-05-03 DIAGNOSIS — E1142 Type 2 diabetes mellitus with diabetic polyneuropathy: Secondary | ICD-10-CM | POA: Diagnosis not present

## 2017-05-03 DIAGNOSIS — B351 Tinea unguium: Secondary | ICD-10-CM | POA: Diagnosis not present

## 2017-05-29 ENCOUNTER — Inpatient Hospital Stay (HOSPITAL_COMMUNITY): Payer: Medicare Other

## 2017-05-29 ENCOUNTER — Encounter (HOSPITAL_COMMUNITY): Payer: Self-pay

## 2017-05-29 ENCOUNTER — Ambulatory Visit (HOSPITAL_COMMUNITY)
Admission: RE | Admit: 2017-05-29 | Discharge: 2017-05-29 | Disposition: A | Discharge status: Home / Self Care | Payer: Medicare Other | Source: Ambulatory Visit | Attending: Adult Health | Admitting: Adult Health

## 2017-05-29 ENCOUNTER — Other Ambulatory Visit: Payer: Self-pay

## 2017-05-29 ENCOUNTER — Inpatient Hospital Stay (HOSPITAL_COMMUNITY): Payer: Medicare Other | Attending: Internal Medicine

## 2017-05-29 DIAGNOSIS — C189 Malignant neoplasm of colon, unspecified: Secondary | ICD-10-CM

## 2017-05-29 DIAGNOSIS — I700 Atherosclerosis of aorta: Secondary | ICD-10-CM | POA: Insufficient documentation

## 2017-05-29 DIAGNOSIS — C7801 Secondary malignant neoplasm of right lung: Secondary | ICD-10-CM | POA: Diagnosis not present

## 2017-05-29 DIAGNOSIS — C19 Malignant neoplasm of rectosigmoid junction: Secondary | ICD-10-CM | POA: Diagnosis not present

## 2017-05-29 DIAGNOSIS — J439 Emphysema, unspecified: Secondary | ICD-10-CM | POA: Insufficient documentation

## 2017-05-29 DIAGNOSIS — C7800 Secondary malignant neoplasm of unspecified lung: Secondary | ICD-10-CM | POA: Diagnosis not present

## 2017-05-29 DIAGNOSIS — K769 Liver disease, unspecified: Secondary | ICD-10-CM | POA: Diagnosis not present

## 2017-05-29 DIAGNOSIS — G620 Drug-induced polyneuropathy: Secondary | ICD-10-CM | POA: Diagnosis not present

## 2017-05-29 LAB — CBC WITH DIFFERENTIAL/PLATELET
Basophils Absolute: 0 10*3/uL (ref 0.0–0.1)
Basophils Relative: 1 %
Eosinophils Absolute: 0.1 10*3/uL (ref 0.0–0.7)
Eosinophils Relative: 2 %
HCT: 40.5 % (ref 39.0–52.0)
Hemoglobin: 13.3 g/dL (ref 13.0–17.0)
Lymphocytes Relative: 15 %
Lymphs Abs: 1 10*3/uL (ref 0.7–4.0)
MCH: 31.1 pg (ref 26.0–34.0)
MCHC: 32.8 g/dL (ref 30.0–36.0)
MCV: 94.6 fL (ref 78.0–100.0)
Monocytes Absolute: 1 10*3/uL (ref 0.1–1.0)
Monocytes Relative: 14 %
Neutro Abs: 4.5 10*3/uL (ref 1.7–7.7)
Neutrophils Relative %: 68 %
Platelets: 215 10*3/uL (ref 150–400)
RBC: 4.28 MIL/uL (ref 4.22–5.81)
RDW: 13.5 % (ref 11.5–15.5)
WBC: 6.6 10*3/uL (ref 4.0–10.5)

## 2017-05-29 LAB — COMPREHENSIVE METABOLIC PANEL
ALT: 19 U/L (ref 17–63)
AST: 28 U/L (ref 15–41)
Albumin: 3.5 g/dL (ref 3.5–5.0)
Alkaline Phosphatase: 92 U/L (ref 38–126)
Anion gap: 11 (ref 5–15)
BUN: 18 mg/dL (ref 6–20)
CO2: 25 mmol/L (ref 22–32)
Calcium: 8.9 mg/dL (ref 8.9–10.3)
Chloride: 102 mmol/L (ref 101–111)
Creatinine, Ser: 1.05 mg/dL (ref 0.61–1.24)
GFR calc Af Amer: >60 mL/min (ref >60)
GFR calc non Af Amer: >60 mL/min (ref >60)
Glucose, Bld: 197 mg/dL — ABNORMAL HIGH (ref 65–99)
Potassium: 4.6 mmol/L (ref 3.5–5.1)
Sodium: 138 mmol/L (ref 135–145)
Total Bilirubin: 0.9 mg/dL (ref 0.3–1.2)
Total Protein: 7 g/dL (ref 6.5–8.1)

## 2017-05-29 MED ORDER — IOPAMIDOL (ISOVUE-300) INJECTION 61%
100.0000 mL | Freq: Once | INTRAVENOUS | Status: AC | PRN
  Administered 2017-05-29: 100 mL via INTRAVENOUS

## 2017-05-29 NOTE — Progress Notes (Signed)
[name] presented for Portacath access and flush. Portacath located right chest wall accessed with  H 20 Power Port needle.  No blood return and flushes w/o difficulty - no s/s infiltration at site.   Portacath flushed with 52ml NS and remains accessed for use for upcoming CT scan at 1100.  Radiology RN to deaccess upon completion of scan.

## 2017-05-30 LAB — CEA: CEA: 4 ng/mL (ref 0.0–4.7)

## 2017-06-05 ENCOUNTER — Encounter (HOSPITAL_COMMUNITY): Payer: Self-pay | Admitting: Internal Medicine

## 2017-06-05 ENCOUNTER — Other Ambulatory Visit: Payer: Self-pay

## 2017-06-05 ENCOUNTER — Inpatient Hospital Stay (HOSPITAL_BASED_OUTPATIENT_CLINIC_OR_DEPARTMENT_OTHER): Payer: Medicare Other | Admitting: Internal Medicine

## 2017-06-05 VITALS — BP 161/50 | HR 88 | Resp 20 | Wt 276.0 lb

## 2017-06-05 DIAGNOSIS — C19 Malignant neoplasm of rectosigmoid junction: Secondary | ICD-10-CM

## 2017-06-05 DIAGNOSIS — C189 Malignant neoplasm of colon, unspecified: Secondary | ICD-10-CM

## 2017-06-05 DIAGNOSIS — R911 Solitary pulmonary nodule: Secondary | ICD-10-CM

## 2017-06-05 DIAGNOSIS — G620 Drug-induced polyneuropathy: Secondary | ICD-10-CM | POA: Diagnosis not present

## 2017-06-05 DIAGNOSIS — C7801 Secondary malignant neoplasm of right lung: Secondary | ICD-10-CM

## 2017-06-05 NOTE — Progress Notes (Signed)
Diagnosis Adenocarcinoma of colon ([place]) - Plan: CBC with Differential/Platelet, Comprehensive metabolic panel, Lactate dehydrogenase, CEA  Solitary pulmonary nodule - Plan: NM PET Image Restag (PS) Skull Base To Thigh  Staging Cancer Staging Adenocarcinoma of colon [facility]) Staging form: Colon and Rectum, AJCC 7th Edition - Clinical stage from 10/04/2012: Stage IIA (T3, N0, M0) - Signed by Baird Cancer, PA-C on 07/15/2015 - Pathologic stage from 09/17/2015: Stage IVA (T3, N0, M1a) - Signed by Baird Cancer, PA-C on 10/05/2015   Assessment and Plan:  1.  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 [name] followed by 6 months worth of adjuvant chemotherapy consisting of FOLFOX with a change in treatment to infusional 5-FU for progressive peripheral neuropathy to finish 6 months worth of treatment by Dr. [name].  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 [name].  Pt was last treated with SBRT of right lung from 01/02/2017 to 01/12/2017.    He is here to go over CT scans.  CAP done 05/29/2017 and showed  IMPRESSION: 1. Area of presumed post radiation change in the medial posterior right lower lobe measures minimally larger today although there is some adjacent atelectasis on today's exam and a new tiny right pleural effusion. Continued close attention on follow-up recommended. 2. No new or progressive findings in the abdomen/pelvis to suggest metastatic disease. 3. 11 mm hyperattenuating posterior right liver lesion unchanged. 4. Previously identified right omental nodule is stable. 5.  Aortic Atherosclerois (ICD10-170.0) 6.  Emphysema. (FUX32-T55.9)  Will set up for Pet scan in 08/2017 for close interval  follow-up due to the lung lesion and question of RT changes versus enlarging lesion.  He will RTC at that time to go over results.  Labs WNL, CEA stable at 4.    2.  PAC questions.  Pt was given the option of port study but he does not desire to have that performed.  Continue port flushes as directed.    3.  Right lung lesion.  Pt had CT of chest done 05/29/2017 that showed Medial right lower lobe pleural-based density is similar to prior measuring 3.5 cm long axis today compared to 2.8 previously. No new pulmonary nodule or mass. Tiny right pleural effusion is new in the interval.  He will be set up for PET scan in 08/2017 for short interval evaluation.    4.  HTN.  BP is 161/50.  Follow-up with PCP.    Interval  history:  77 y.o. 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 [name] followed by 6 months worth of adjuvant chemotherapy consisting of FOLFOX with a change in treatment to infusional 5-FU for progressive peripheral neuropathy to finish 6 months worth of treatment by Dr. [name].  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 [name].  Current Status:  Pt is seen today for follow-up.  He is here to go over CT Scan  He is complaining of Port problems in radiology.       Adenocarcinoma of colon ([place])   10/04/2012 Definitive Surgery  Partial collectomy by Dr. [name]      10/04/2012 Pathology Results    4.5 cm poorly differentiated adenocarcinoma, negative margins, 0/7 lymph nodes for metastatic disease.  Oncotype recurrence score of 26 (high)       Chemotherapy    FOLFOX       Adverse Reaction    Progressive peripheral neuropathy       Chemotherapy    Infusional 5 FU.  Completed 6  months worth of adjuvant therapy.      02/23/2014 Procedure    Colonoscopy by Dr. Anthony Sar.      07/01/2015 PET scan    There is malignant range uptake with RLL pulmonary nodule suspicious for metastatic disease or primary pulm neoplasm. Nonspecific focus of uptake in the L femoral neck. No corresponding CT abnormality. Cannot rule out metastasis.      07/06/2015 Imaging    MR C-spine- C5-6 there is a broad-based disc bulge w R paracentral disc protrusion deforming the spinal cord. Moderate R foraminal stenosis. C6-7 there is a central disc protrusion slightly eccentric towards the R and mildly deforming the ventral c-spine       09/16/2015 Procedure    RLL needle biopsy by IR.      09/17/2015 Pathology Results    Metastatic adenocarcinoma consistent with a primary colorectal adenocarcinoma.      10/12/2015 - 10/22/2015 Radiation Therapy    SBRT by Dr. [name] to RLL pulmonary lesion (biopsy proven to be oligometastasis).      12/09/2015 Imaging    CT chest- 1. Slight interval decrease in size of the right lower lobe pulmonary nodule. Some surrounding radiation changes. 2. No mediastinal or hilar mass or adenopathy. 3. No new pulmonary nodules. 4. Stable underline emphysematous changes. 5. Cirrhotic changes involving the liver but no upper abdominal metastatic disease.      03/14/2016 Imaging    CT chest- 1. Continued mild reduction in the size of the treated right lobe pulmonary nodule. 2. Increased patchy consolidation, reticulation and ground-glass attenuation in the basilar right lower lobe, nonspecific, favor evolving postradiation change, recommend attention on follow-up chest CT. 3. No evidence of new or progressive metastatic disease in the chest. 4. Aortic atherosclerosis. Left main and 3 vessel coronary atherosclerosis. 5. Mild emphysema.      06/30/2016 Imaging    CT CAP- 1. The appearance of the treated right lower lobe nodule is essentially unchanged, and there are  evolving postradiation changes in the base of the right lower lobe, as above. No definite signs of new metastatic disease are noted elsewhere in the chest, abdomen or pelvis. 2. Aortic atherosclerosis, in addition to left main and 3 vessel coronary artery disease. Assessment for potential risk factor modification, dietary therapy or pharmacologic therapy may be warranted, if clinically indicated. 3. Morphologic changes in the liver suggestive of cirrhosis, as above. 4. Additional incidental findings, similar prior studies, as above.      11/17/2016 Imaging    CT CAP Study Result   CLINICAL DATA:  Stage IV colon cancer originally diagnosed in August 2014 status post partial colectomy, with right lower lobe lung metastasis diagnosed August 2017 status post SBRT completed 10/22/2015. Restaging.  EXAM: CT CHEST, ABDOMEN, AND PELVIS WITH CONTRAST  TECHNIQUE: Multidetector CT imaging of the chest, abdomen and pelvis was performed following the standard protocol during bolus administration of intravenous contrast.  CONTRAST:  14m ISOVUE-300 IOPAMIDOL (ISOVUE-300) INJECTION 61%  COMPARISON:  06/30/2016 CT chest, abdomen and pelvis.  FINDINGS: CT CHEST FINDINGS  Cardiovascular: Normal heart size. No significant pericardial fluid/thickening. Left main, left anterior descending, left circumflex and right coronary atherosclerosis. Atherosclerotic nonaneurysmal thoracic aorta. Normal caliber pulmonary arteries. No central pulmonary emboli. Right internal jugular MediPort terminates at the junction of the right and left brachiocephalic veins.  Mediastinum/Nodes: No discrete thyroid nodules. Unremarkable esophagus. No pathologically enlarged axillary, mediastinal or hilar lymph nodes.  Lungs/Pleura: No pneumothorax. No pleural effusion. There is a nodular 2.7 x 1.6 cm focus of consolidation in the medial basilar right lower lobe (series 3/ image 108), previously 1.6 x 1.1  cm. No appreciable change in patchy subpleural reticulation in both lungs without significant traction bronchiectasis or frank honeycombing. Mild centrilobular and paraseptal emphysema with mild diffuse bronchial wall thickening. No acute consolidative airspace disease or new significant pulmonary nodules.  Musculoskeletal: No aggressive appearing focal osseous lesions. Moderate thoracic spondylosis. Stable chronic left scapular deformity.  CT ABDOMEN PELVIS FINDINGS  Hepatobiliary: Normal liver with no liver mass. Cholecystectomy. No biliary ductal dilatation.  Pancreas: Normal, with no mass or duct dilation.  Spleen: Normal size. No mass.  Adrenals/Urinary Tract: Normal adrenals. No hydronephrosis. No renal masses. Normal bladder.  Stomach/Bowel: Grossly normal stomach. Normal caliber small bowel with no small bowel wall thickening. Stable appearance of the appendix, within normal limits. Stable postsurgical changes from partial distal colectomy with intact appearing distal colonic anastomosis. Oral contrast transits to the cecum. No large bowel wall thickening or new pericholecystic fat stranding .  Vascular/Lymphatic: Atherosclerotic nonaneurysmal abdominal aorta. Patent portal, splenic, hepatic and renal veins. No pathologically enlarged lymph nodes in the abdomen or pelvis.  Reproductive:  Normal size prostate.  Other: No pneumoperitoneum, ascites or focal fluid collection. Stable subcutaneous calcified granulomas throughout the left lower back and gluteal regions.  Musculoskeletal: No aggressive appearing focal osseous lesions. Marked lumbar spondylosis.  IMPRESSION: 1. Nodular focus of consolidation in the medial basilar right lower lobe is increased in size. While this may represent evolving masslike radiation fibrosis, recurrence of the lung metastasis is not excluded. PET-CT would be useful for further evaluation. Otherwise, continued close chest  CT surveillance is advised . 2. No additional potential new sites of metastatic disease in the chest, abdomen or pelvis. 3. No evidence of local tumor recurrence at the distal colonic anastomosis. 4. Left main and 3 vessel coronary atherosclerosis . 5. Aortic Atherosclerosis (ICD10-I70.0) and Emphysema (ICD10-J43.9).         11/30/2016 Relapse/Recurrence    PET: IMPRESSION: 1. Enlarging right lower lobe pulmonary nodule and slight increase in the SUV max suggesting residual neoplasm. No new pulmonary lesions. 2. No findings for local recurrence or abdominal/pelvic metastatic disease.      01/02/2017 - 01/12/2017 Radiation Therapy     The patient saw Dr. [name] for SBRT radiation treatment. The tumor in the right lung was treated with a course of stereotactic body radiation treatment. The patient received 50 Gy In 5 fractions at 10 Gy per fraction.           Problem List Patient Active Problem List   Diagnosis Date Noted  . Lung metastasis ([place]) [C78.00] 10/20/2015  . 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 Adenocarcinoma of colon ([place]) [C18.9] 07/15/2015  . Sepsis ([place]) [A41.9] 03/19/2013  . CHF, chronic ([place]) [I50.9] 03/19/2013  . Diarrhea [R19.7] 02/19/2013  . Dvt femoral (deep venous thrombosis) ([place]) [I82.419] 02/19/2013  . HTN (hypertension) [I10] 09/09/2012  . Diabetes mellitus ([place]) [E11.9] 09/09/2012  . Morbid obesity ([place]) [E66.01] 09/09/2012  . Sleep apnea [G47.30]  09/09/2012  . CKD (chronic kidney disease) [N18.9] 09/09/2012    Past Medical History Past Medical History:  Diagnosis Date  . Adenocarcinoma of colon ([place])    RLL -  Pulmonary nodule (06/2015, concerning for mets vs primary; to see RAD-ONC Dr. [name]); stage 2 adenocarinoma   2014 - Colon Cancer - surgery and chemo  . Blood infection ([place])   . Cystitis   . Diabetes mellitus without complication ([place])    Type II  . DVT (deep venous thrombosis) ([place]) 2014   post op  .  Hematuria   . Hypertension   . Neuropathy   . Prostatitis   . Pulmonary nodule   . Sleep apnea     Past Surgical History Past Surgical History:  Procedure Laterality Date  . BACK SURGERY    . CATARACT EXTRACTION     left  . CATARACT EXTRACTION W/PHACO Right 04/11/2012   Procedure: CATARACT EXTRACTION PHACO AND INTRAOCULAR LENS PLACEMENT (IOC);  Surgeon: Tonny Branch, MD;  Location: [facility] ORS;  Service: Ophthalmology;  Laterality: Right;  CDE:62.48  . CERVICAL FUSION     c4-c5  . CHOLECYSTECTOMY    . COLON SURGERY  09/29/12   Colon resection for cancer  . COLONOSCOPY    . EYE SURGERY     cataract  . GIVENS CAPSULE STUDY N/A 03/15/2015   Procedure: GIVENS CAPSULE STUDY;  Surgeon: Rogene Houston, MD;  Location: [facility] ENDO SUITE;  Service: Endoscopy;  Laterality: N/A;  730  . LEG SURGERY    . ORIF FEMUR FRACTURE     right  . POSTERIOR CERVICAL FUSION/FORAMINOTOMY N/A 07/27/2015   Procedure: Cervical four to Cervical seven Laminectomy with Cervical four to Thoracic one Dorsal internal fixation and fusion;  Surgeon: Kevan Ny Ditty, MD;  Location: [facility];  Service: Neurosurgery;  Laterality: N/A;  C4 to C7 Laminectomy with C4 to T1 Dorsal internal fixation and fusion    Family History Family History  Problem Relation Age of Onset  . Heart disease Father   . Heart attack Father   . Alzheimer's disease Mother      Social History  reports that he has quit smoking. His smoking use included cigarettes. He started smoking about 62 years ago. He has a 55.00 pack-year smoking history. He has never used smokeless tobacco. He reports that he drinks about 1.8 oz of alcohol per week. He reports that he does not use drugs.  Medications  Current Outpatient Medications:  .  acetaminophen (TYLENOL) 650 MG CR tablet, Take 650 mg every 8 (eight) hours as needed by mouth for pain., Disp: , Rfl:  .  aspirin EC 81 MG tablet, Take 81 mg by mouth daily., Disp: , Rfl:  .  furosemide (LASIX) 20 MG  tablet, Take 20 mg by mouth every other day. , Disp: , Rfl:  .  gabapentin (NEURONTIN) 300 MG capsule, Take 2 capsules (600 mg total) by mouth 3 (three) times daily. (Patient taking differently: Take 600 mg by mouth 2 (two) times daily. ), Disp: 180 capsule, Rfl: 2 .  insulin NPH (HUMULIN N,NOVOLIN N) 100 UNIT/ML injection, Inject 15-30 Units into the skin See admin instructions. 25 units every morning and 12 units in the evening, Disp: , Rfl:  .  lisinopril (PRINIVIL,ZESTRIL) 40 MG tablet, Take 40 mg by mouth daily., Disp: , Rfl:  .  naproxen (NAPROSYN) 500 MG tablet, TAKE 1 TABLET BY MOUTH TWICE A DAY WITH MEALS, Disp: , Rfl:  .  nystatin-triamcinolone (MYCOLOG  II) cream, Apply topically., Disp: , Rfl:  .  OXYGEN, Inhale 2 L into the lungs continuous. At night time only, Disp: , Rfl:   Allergies Patient has no known allergies.  Review of Systems Review of Systems - Oncology ROS as per HPI otherwise 12 point ROS is negative.   Physical Exam  Vitals Wt Readings from Last 3 Encounters:  06/05/17 276 lb (125.2 kg)  05/29/17 277 lb 8 oz (125.9 kg)  03/06/17 269 lb (122 kg)   Temp Readings from Last 3 Encounters:  05/29/17 98 F (36.7 C) (Oral)  04/03/17 97.7 F (36.5 C) (Oral)  03/06/17 98.2 F (36.8 C) (Oral)   BP Readings from Last 3 Encounters:  06/05/17 (!) 161/50  05/29/17 (!) 151/66  04/03/17 (!) 120/46   Pulse Readings from Last 3 Encounters:  06/05/17 88  05/29/17 95  04/03/17 84    Constitutional: Well-developed, well-nourished, and in no distress.   HENT: Head: Normocephalic and atraumatic.  Mouth/Throat: No oropharyngeal exudate. Mucosa moist. Eyes: Pupils are equal, round, and reactive to light. Conjunctivae are normal. No scleral icterus.  Neck: Normal range of motion. Neck supple. No JVD present.  Cardiovascular: Normal rate, regular rhythm and normal heart sounds.  Exam reveals no gallop and no friction rub.   No murmur heard. Pulmonary/Chest: Effort  normal and breath sounds normal. No respiratory distress. No wheezes.No rales.  Abdominal: Soft. Bowel sounds are normal. No distension. There is no tenderness. There is no guarding.  Musculoskeletal: No edema or tenderness.  Lymphadenopathy: No cervical, axillary or supraclavicular adenopathy.  Neurological: Alert and oriented to person, place, and time. No cranial nerve deficit.  Skin: Skin is warm and dry. No rash noted. No erythema. No pallor.  Psychiatric: Affect and judgment normal.   Labs No visits with results within 3 Day(s) from this visit.  Latest known visit with results is:  Infusion on 05/29/2017  Component Date Value Ref Range Status  . CEA 05/29/2017 4.0  0.0 - 4.7 ng/mL Final   Comment: (NOTE)                             Nonsmokers          <3.9                             Smokers             <5.6 [employer] Electrochemiluminescence Immunoassay (ECLIA) Values obtained with different assay methods or kits cannot be used interchangeably.  Results cannot be interpreted as absolute evidence of the presence or absence of malignant disease. Performed At: [facility] [place], [place] [id number] Rush Farmer MD PV:[id number] Performed at [facility], [address] [place], [place] [address]   . Sodium 05/29/2017 138  135 - 145 mmol/L Final  . Potassium 05/29/2017 4.6  3.5 - 5.1 mmol/L Final  . Chloride 05/29/2017 102  101 - 111 mmol/L Final  . CO2 05/29/2017 25  22 - 32 mmol/L Final  . Glucose, Bld 05/29/2017 197* 65 - 99 mg/dL Final  . BUN 05/29/2017 18  6 - 20 mg/dL Final  . Creatinine, Ser 05/29/2017 1.05  0.61 - 1.24 mg/dL Final  . Calcium 05/29/2017 8.9  8.9 - 10.3 mg/dL Final  . Total Protein 05/29/2017 7.0  6.5 - 8.1 g/dL Final  . Albumin 05/29/2017 3.5  3.5 - 5.0  g/dL Final  . AST 05/29/2017 28  15 - 41 U/L Final  . ALT 05/29/2017 19  17 - 63 U/L Final  . Alkaline Phosphatase 05/29/2017 92  38 - 126 U/L Final  . Total  Bilirubin 05/29/2017 0.9  0.3 - 1.2 mg/dL Final  . GFR calc non Af Amer 05/29/2017 >60  >60 mL/min Final  . GFR calc Af Amer 05/29/2017 >60  >60 mL/min Final   Comment: (NOTE) The eGFR has been calculated using the CKD EPI equation. This calculation has not been validated in all clinical situations. eGFR's persistently <60 mL/min signify possible Chronic Kidney Disease.   Georgiann Hahn gap 05/29/2017 11  5 - 15 Final   Performed at [facility], [address] [place], [place] [address]  . WBC 05/29/2017 6.6  4.0 - 10.5 K/uL Final  . RBC 05/29/2017 4.28  4.22 - 5.81 MIL/uL Final  . Hemoglobin 05/29/2017 13.3  13.0 - 17.0 g/dL Final  . HCT 05/29/2017 40.5  39.0 - 52.0 % Final  . MCV 05/29/2017 94.6  78.0 - 100.0 fL Final  . MCH 05/29/2017 31.1  26.0 - 34.0 pg Final  . MCHC 05/29/2017 32.8  30.0 - 36.0 g/dL Final  . RDW 05/29/2017 13.5  11.5 - 15.5 % Final  . Platelets 05/29/2017 215  150 - 400 K/uL Final  . Neutrophils Relative % 05/29/2017 68  % Final  . Neutro Abs 05/29/2017 4.5  1.7 - 7.7 K/uL Final  . Lymphocytes Relative 05/29/2017 15  % Final  . Lymphs Abs 05/29/2017 1.0  0.7 - 4.0 K/uL Final  . Monocytes Relative 05/29/2017 14  % Final  . Monocytes Absolute 05/29/2017 1.0  0.1 - 1.0 K/uL Final  . Eosinophils Relative 05/29/2017 2  % Final  . Eosinophils Absolute 05/29/2017 0.1  0.0 - 0.7 K/uL Final  . Basophils Relative 05/29/2017 1  % Final  . Basophils Absolute 05/29/2017 0.0  0.0 - 0.1 K/uL Final   Performed at [facility], [address] [place], [place] [address]     Pathology Orders Placed This Encounter  Procedures  . NM PET Image Restag (PS) Skull Base To Thigh    Standing Status:   Future    Standing Expiration Date:   06/05/2018    Order Specific Question:   If indicated for the ordered procedure, I authorize the administration of a radiopharmaceutical per Radiology protocol    Answer:   Yes    Order Specific Question:   Preferred imaging location?    Answer:    [facility]    Order Specific Question:   Radiology Contrast Protocol - do NOT remove file path    Answer:   [email or web address]  . CBC with Differential/Platelet    Standing Status:   Future    Standing Expiration Date:   06/06/2018  . Comprehensive metabolic panel    Standing Status:   Future    Standing Expiration Date:   06/06/2018  . Lactate dehydrogenase    Standing Status:   Future    Standing Expiration Date:   06/06/2018  . CEA    Standing Status:   Future    Standing Expiration Date:   06/06/2018       Zoila Shutter MD

## 2017-06-24 IMAGING — CT CT ABD-PELV W/ CM
2 of 4 series · 12 of 36 positions shown, 15 images · IV contrast (Isovue)
Comparison: Chest CT 03/14/2016. PET-CT 07/01/2015. CT the chest,
abdomen and pelvis 06/23/2015.

CLINICAL DATA: 75-year-old male with history of colonic
adenocarcinoma with metastatic disease to the right lower lobe.
Status post partial colectomy, chemotherapy and radiation therapy.
Followup study.

EXAM:
CT CHEST, ABDOMEN, AND PELVIS WITH CONTRAST
TECHNIQUE: Multidetector CT imaging of the chest, abdomen and pelvis was
performed following the standard protocol during bolus
administration of intravenous contrast.
CONTRAST:  100mL [id number] IOPAMIDOL ([id number]) INJECTION 61%

[Series 2: cap with · axial · 0.98mm/px · z∈[+1114,+1639]mm · 9 of 133 slices shown, 12 images]
[im 14/133  mediastinal]
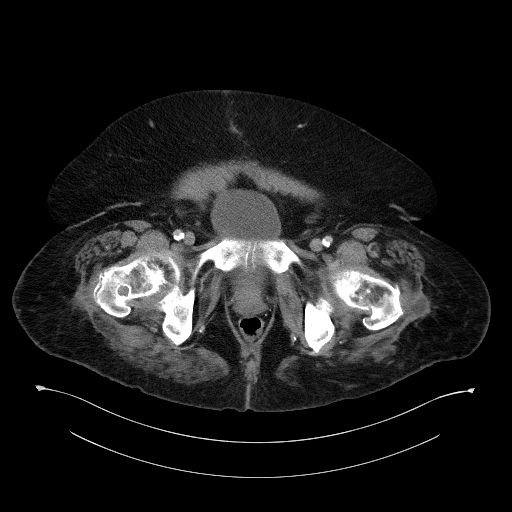
[im 14/133  lung]
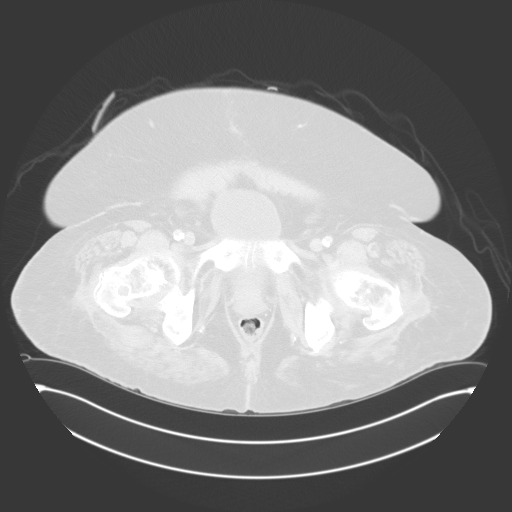
[im 27/133  lung]
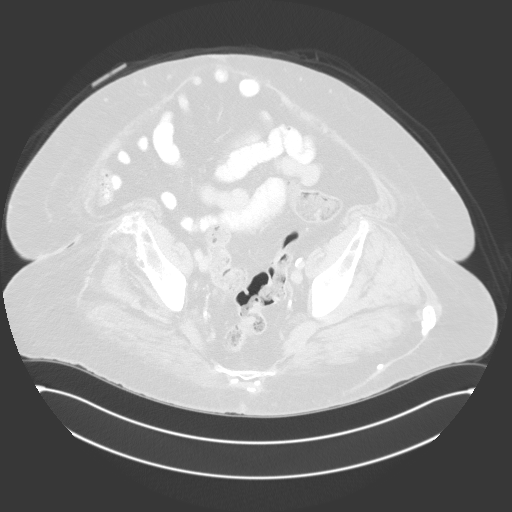
[im 40/133  lung]
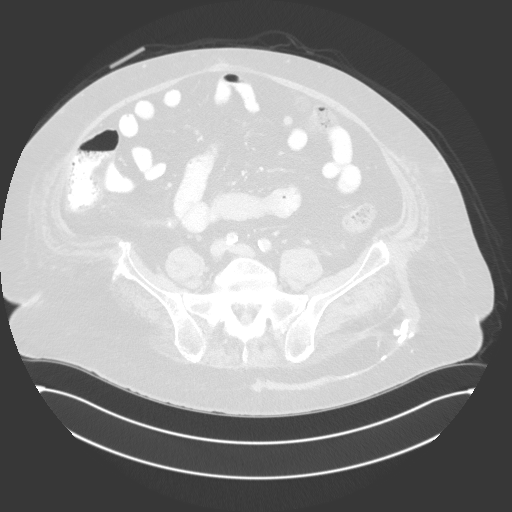
[im 53/133  lung]
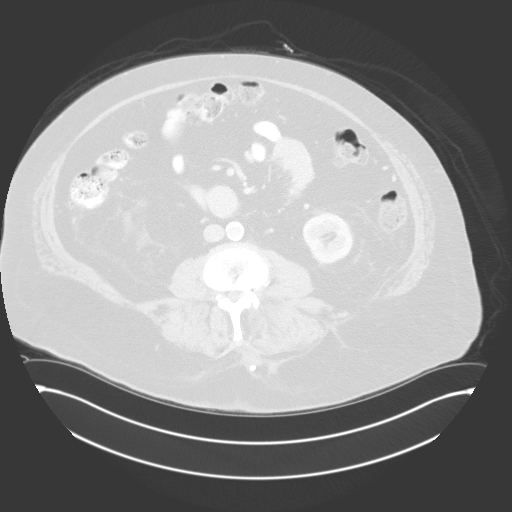
[im 67/133  mediastinal]
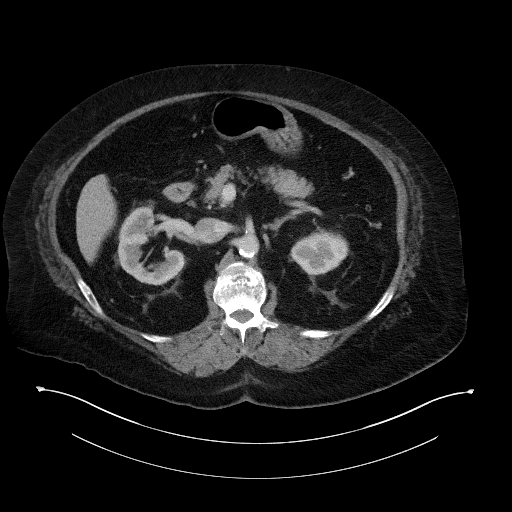
[im 67/133  lung]
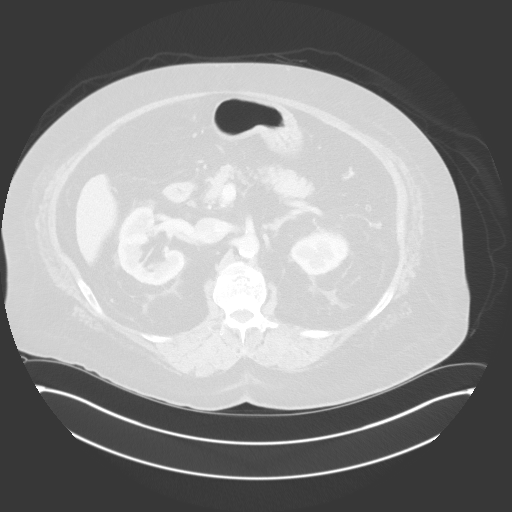
[im 80/133  lung]
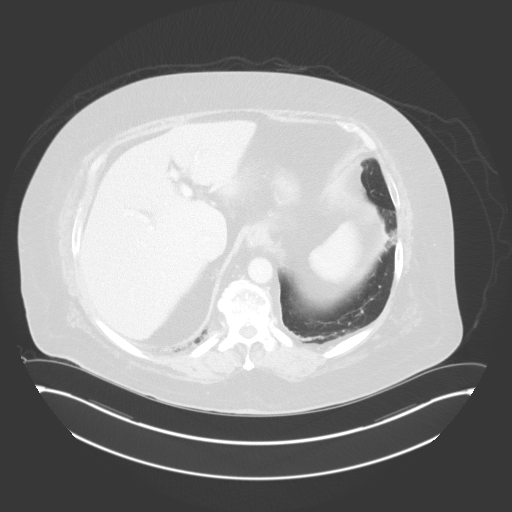
[im 93/133  lung]
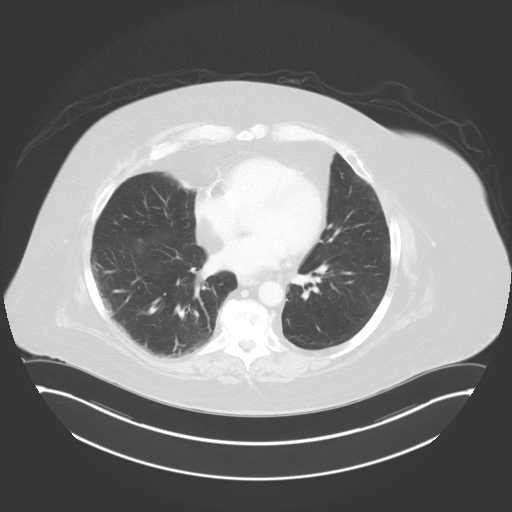
[im 106/133  lung]
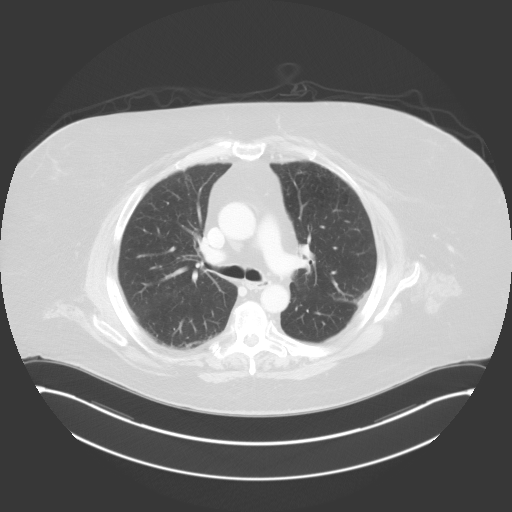
[im 119/133  mediastinal]
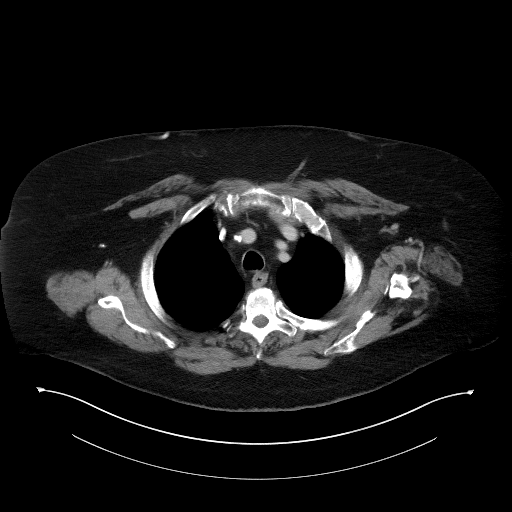
[im 119/133  lung]
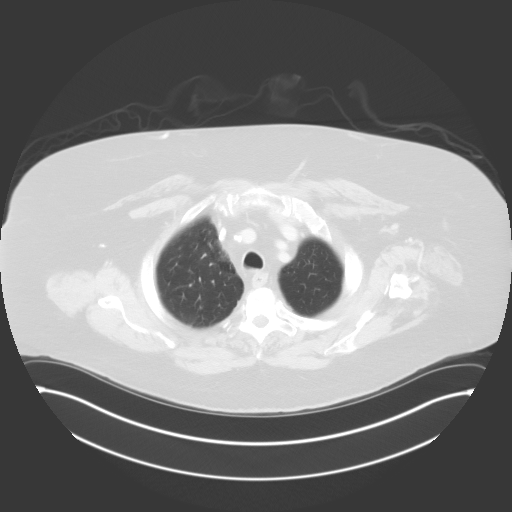

[Series 5: coronals · coronal · 0.82mm/px · 3 of 163 slices shown]
[im 33/163  lung]
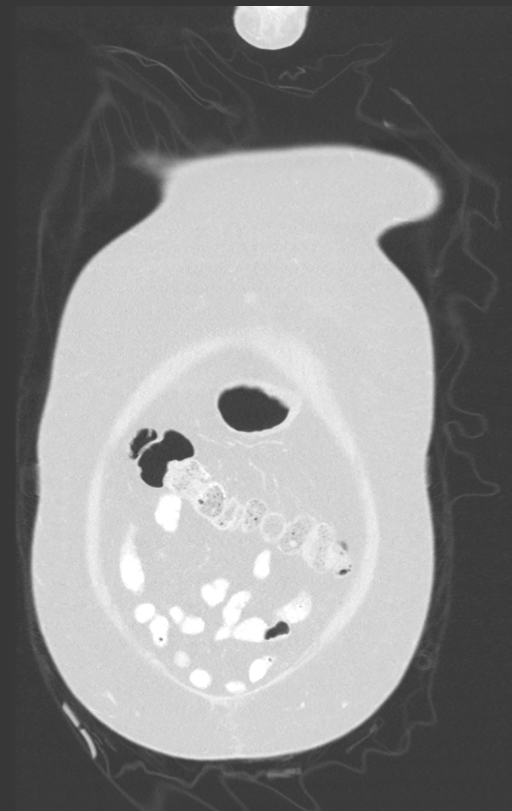
[im 65/163  lung]
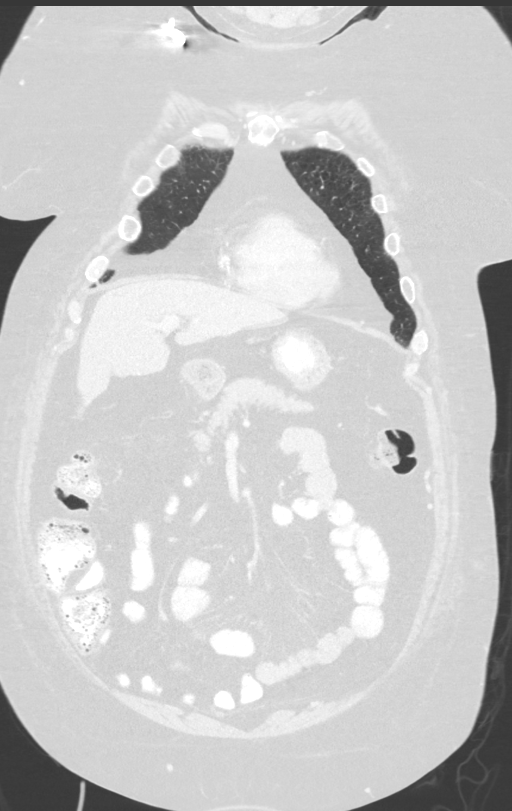
[im 98/163  lung]
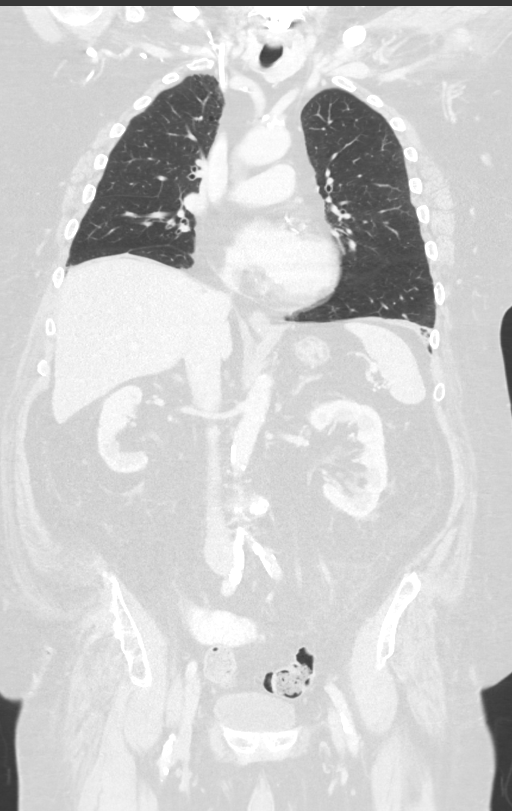

[12 of 36 positions shown; findings below may reference images not displayed]

FINDINGS: CT CHEST FINDINGS

Cardiovascular: Heart size is normal. There is no significant
pericardial fluid, thickening or pericardial calcification. There is
aortic atherosclerosis, as well as atherosclerosis of the great
vessels of the mediastinum and the coronary arteries, including
calcified atherosclerotic plaque in the left main, left anterior
descending, left circumflex and right coronary arteries. Right
internal jugular single-lumen porta cath with tip terminating in the
proximal superior vena cava.

Mediastinum/Nodes: No pathologically enlarged mediastinal or hilar
lymph nodes. Esophagus is unremarkable in appearance. No axillary
lymphadenopathy.

Lungs/Pleura: Previously noted treated nodule in the medial aspect
of the right lower lobe appears very similar to the prior study
measuring 16 x 11 mm on today's study (image 121 of series 4). The
other adjacent nodule in the medial aspect of the right lower lobe
seen on the prior examination (image 110 of series 4 of prior study
03/14/2016) has essentially resolved. There continues to be a
adjacent areas of architectural distortion in the adjacent region of
lung, most compatible with evolving post radiation scarring.
Scattered areas of mild ground-glass attenuation septal thickening
are noted throughout the periphery of the lungs bilaterally. No
acute consolidative airspace disease. No pleural effusions. A few
other tiny pulmonary nodules are also noted, unchanged and
nonspecific.

Musculoskeletal: Orthopedic fixation hardware in the lower cervical
and upper thoracic spine. Old T1 compression fracture with 80% loss
of anterior vertebral body height (unchanged). There are no
aggressive appearing lytic or blastic lesions noted in the
visualized portions of the skeleton.

CT ABDOMEN PELVIS FINDINGS

Hepatobiliary: Liver has a slightly shrunken appearance and nodular
contour, suggestive of underlying cirrhosis. No definite cystic or
solid hepatic lesions. No intra or extrahepatic biliary ductal
dilatation. Status post cholecystectomy.

Pancreas: No pancreatic mass. No pancreatic ductal dilatation. No
pancreatic or peripancreatic fluid or inflammatory changes.

Spleen: Unremarkable.

Adrenals/Urinary Tract: Bilateral kidneys and bilateral adrenal
glands are normal in appearance. No hydroureteronephrosis. Urinary
bladder is normal in appearance.

Stomach/Bowel: Appearance of the stomach is normal. There is no
pathologic dilatation of small bowel or colon. Normal appendix.

Vascular/Lymphatic: Aortic atherosclerosis, without evidence of
aneurysm or dissection in the abdominal or pelvic vasculature. No
lymphadenopathy noted in the abdomen or pelvis.

Reproductive: Prostate gland and seminal vesicles are unremarkable
in appearance.

Other: No significant volume of ascites.  No pneumoperitoneum.

Musculoskeletal: There are no aggressive appearing lytic or blastic
lesions noted in the visualized portions of the skeleton.
IMPRESSION: 1. The appearance of the treated right lower lobe nodule is
essentially unchanged, and there are evolving postradiation changes
in the base of the right lower lobe, as above. No definite signs of
new metastatic disease are noted elsewhere in the chest, abdomen or
pelvis.
2. Aortic atherosclerosis, in addition to left main and 3 vessel
coronary artery disease. Assessment for potential risk factor
modification, dietary therapy or pharmacologic therapy may be
warranted, if clinically indicated.
3. Morphologic changes in the liver suggestive of cirrhosis, as
above.
4. Additional incidental findings, similar prior studies, as above.

## 2017-06-25 DIAGNOSIS — R911 Solitary pulmonary nodule: Secondary | ICD-10-CM | POA: Diagnosis not present

## 2017-06-25 DIAGNOSIS — G4733 Obstructive sleep apnea (adult) (pediatric): Secondary | ICD-10-CM | POA: Diagnosis not present

## 2017-06-25 DIAGNOSIS — Z794 Long term (current) use of insulin: Secondary | ICD-10-CM | POA: Diagnosis not present

## 2017-06-25 DIAGNOSIS — E119 Type 2 diabetes mellitus without complications: Secondary | ICD-10-CM | POA: Diagnosis not present

## 2017-06-25 DIAGNOSIS — Z9989 Dependence on other enabling machines and devices: Secondary | ICD-10-CM | POA: Diagnosis not present

## 2017-06-25 DIAGNOSIS — E876 Hypokalemia: Secondary | ICD-10-CM | POA: Diagnosis not present

## 2017-06-25 DIAGNOSIS — I509 Heart failure, unspecified: Secondary | ICD-10-CM | POA: Diagnosis not present

## 2017-06-25 DIAGNOSIS — I10 Essential (primary) hypertension: Secondary | ICD-10-CM | POA: Diagnosis not present

## 2017-06-25 DIAGNOSIS — H6121 Impacted cerumen, right ear: Secondary | ICD-10-CM | POA: Diagnosis not present

## 2017-06-28 DIAGNOSIS — E113293 Type 2 diabetes mellitus with mild nonproliferative diabetic retinopathy without macular edema, bilateral: Secondary | ICD-10-CM | POA: Diagnosis not present

## 2017-06-28 DIAGNOSIS — H5015 Alternating exotropia: Secondary | ICD-10-CM | POA: Diagnosis not present

## 2017-06-28 DIAGNOSIS — Z961 Presence of intraocular lens: Secondary | ICD-10-CM | POA: Diagnosis not present

## 2017-06-28 DIAGNOSIS — Z9849 Cataract extraction status, unspecified eye: Secondary | ICD-10-CM | POA: Diagnosis not present

## 2017-07-10 DIAGNOSIS — M79676 Pain in unspecified toe(s): Secondary | ICD-10-CM | POA: Diagnosis not present

## 2017-07-10 DIAGNOSIS — L84 Corns and callosities: Secondary | ICD-10-CM | POA: Diagnosis not present

## 2017-07-10 DIAGNOSIS — B351 Tinea unguium: Secondary | ICD-10-CM | POA: Diagnosis not present

## 2017-07-10 DIAGNOSIS — E1142 Type 2 diabetes mellitus with diabetic polyneuropathy: Secondary | ICD-10-CM | POA: Diagnosis not present

## 2017-08-02 ENCOUNTER — Ambulatory Visit (INDEPENDENT_AMBULATORY_CARE_PROVIDER_SITE_OTHER): Payer: Medicare Other | Admitting: Otolaryngology

## 2017-08-02 DIAGNOSIS — K219 Gastro-esophageal reflux disease without esophagitis: Secondary | ICD-10-CM

## 2017-08-02 DIAGNOSIS — R070 Pain in throat: Secondary | ICD-10-CM

## 2017-08-27 ENCOUNTER — Inpatient Hospital Stay (HOSPITAL_COMMUNITY): Payer: Medicare Other | Attending: Internal Medicine

## 2017-08-27 ENCOUNTER — Encounter (HOSPITAL_COMMUNITY)
Admission: RE | Admit: 2017-08-27 | Discharge: 2017-08-27 | Disposition: A | Discharge status: Home / Self Care | Payer: Medicare Other | Source: Ambulatory Visit | Attending: Internal Medicine | Admitting: Internal Medicine

## 2017-08-27 ENCOUNTER — Encounter (HOSPITAL_COMMUNITY): Payer: Self-pay

## 2017-08-27 ENCOUNTER — Inpatient Hospital Stay (HOSPITAL_COMMUNITY): Payer: Medicare Other

## 2017-08-27 DIAGNOSIS — I10 Essential (primary) hypertension: Secondary | ICD-10-CM | POA: Insufficient documentation

## 2017-08-27 DIAGNOSIS — N189 Chronic kidney disease, unspecified: Secondary | ICD-10-CM | POA: Insufficient documentation

## 2017-08-27 DIAGNOSIS — Z9221 Personal history of antineoplastic chemotherapy: Secondary | ICD-10-CM | POA: Insufficient documentation

## 2017-08-27 DIAGNOSIS — Z452 Encounter for adjustment and management of vascular access device: Secondary | ICD-10-CM | POA: Diagnosis not present

## 2017-08-27 DIAGNOSIS — C189 Malignant neoplasm of colon, unspecified: Secondary | ICD-10-CM | POA: Insufficient documentation

## 2017-08-27 DIAGNOSIS — E1122 Type 2 diabetes mellitus with diabetic chronic kidney disease: Secondary | ICD-10-CM | POA: Diagnosis not present

## 2017-08-27 DIAGNOSIS — J90 Pleural effusion, not elsewhere classified: Secondary | ICD-10-CM | POA: Insufficient documentation

## 2017-08-27 DIAGNOSIS — Z923 Personal history of irradiation: Secondary | ICD-10-CM | POA: Diagnosis not present

## 2017-08-27 DIAGNOSIS — Z86718 Personal history of other venous thrombosis and embolism: Secondary | ICD-10-CM | POA: Insufficient documentation

## 2017-08-27 DIAGNOSIS — Z79899 Other long term (current) drug therapy: Secondary | ICD-10-CM | POA: Insufficient documentation

## 2017-08-27 DIAGNOSIS — K769 Liver disease, unspecified: Secondary | ICD-10-CM | POA: Insufficient documentation

## 2017-08-27 DIAGNOSIS — C7801 Secondary malignant neoplasm of right lung: Secondary | ICD-10-CM | POA: Diagnosis not present

## 2017-08-27 DIAGNOSIS — Z87891 Personal history of nicotine dependence: Secondary | ICD-10-CM | POA: Insufficient documentation

## 2017-08-27 DIAGNOSIS — Z7982 Long term (current) use of aspirin: Secondary | ICD-10-CM | POA: Insufficient documentation

## 2017-08-27 DIAGNOSIS — Z9049 Acquired absence of other specified parts of digestive tract: Secondary | ICD-10-CM | POA: Insufficient documentation

## 2017-08-27 DIAGNOSIS — Z794 Long term (current) use of insulin: Secondary | ICD-10-CM | POA: Insufficient documentation

## 2017-08-27 DIAGNOSIS — R911 Solitary pulmonary nodule: Secondary | ICD-10-CM

## 2017-08-27 LAB — COMPREHENSIVE METABOLIC PANEL
ALT: 16 U/L (ref 0–44)
AST: 22 U/L (ref 15–41)
Albumin: 3.4 g/dL — ABNORMAL LOW (ref 3.5–5.0)
Alkaline Phosphatase: 76 U/L (ref 38–126)
Anion gap: 8 (ref 5–15)
BUN: 20 mg/dL (ref 8–23)
CO2: 24 mmol/L (ref 22–32)
Calcium: 8.5 mg/dL — ABNORMAL LOW (ref 8.9–10.3)
Chloride: 108 mmol/L (ref 98–111)
Creatinine, Ser: 1.05 mg/dL (ref 0.61–1.24)
GFR calc Af Amer: >60 mL/min (ref >60)
GFR calc non Af Amer: >60 mL/min (ref >60)
Glucose, Bld: 135 mg/dL — ABNORMAL HIGH (ref 70–99)
Potassium: 4.5 mmol/L (ref 3.5–5.1)
Sodium: 140 mmol/L (ref 135–145)
Total Bilirubin: 0.7 mg/dL (ref 0.3–1.2)
Total Protein: 6.8 g/dL (ref 6.5–8.1)

## 2017-08-27 LAB — CBC WITH DIFFERENTIAL/PLATELET
Basophils Absolute: 0 10*3/uL (ref 0.0–0.1)
Basophils Relative: 1 %
Eosinophils Absolute: 0.1 10*3/uL (ref 0.0–0.7)
Eosinophils Relative: 2 %
HCT: 39.4 % (ref 39.0–52.0)
Hemoglobin: 13.3 g/dL (ref 13.0–17.0)
Lymphocytes Relative: 18 %
Lymphs Abs: 1.2 10*3/uL (ref 0.7–4.0)
MCH: 31.8 pg (ref 26.0–34.0)
MCHC: 33.8 g/dL (ref 30.0–36.0)
MCV: 94.3 fL (ref 78.0–100.0)
Monocytes Absolute: 0.7 10*3/uL (ref 0.1–1.0)
Monocytes Relative: 10 %
Neutro Abs: 4.7 10*3/uL (ref 1.7–7.7)
Neutrophils Relative %: 69 %
Platelets: 192 10*3/uL (ref 150–400)
RBC: 4.18 MIL/uL — ABNORMAL LOW (ref 4.22–5.81)
RDW: 13.5 % (ref 11.5–15.5)
WBC: 6.8 10*3/uL (ref 4.0–10.5)

## 2017-08-27 LAB — LACTATE DEHYDROGENASE: LDH: 168 U/L (ref 98–192)

## 2017-08-27 MED ORDER — HEPARIN SOD (PORK) LOCK FLUSH 100 UNIT/ML IV SOLN
500.0000 [IU] | Freq: Once | INTRAVENOUS | Status: AC
  Administered 2017-08-27: 500 [IU] via INTRAVENOUS

## 2017-08-27 MED ORDER — SODIUM CHLORIDE 0.9% FLUSH
10.0000 mL | INTRAVENOUS | Status: DC | PRN
  Administered 2017-08-27: 10 mL via INTRAVENOUS
  Filled 2017-08-27: qty 10

## 2017-08-27 MED ORDER — FLUDEOXYGLUCOSE F - 18 (FDG) INJECTION
15.0000 | Freq: Once | INTRAVENOUS | Status: AC | PRN
  Administered 2017-08-27: 14.94 via INTRAVENOUS

## 2017-08-27 MED ORDER — HEPARIN SOD (PORK) LOCK FLUSH 100 UNIT/ML IV SOLN
INTRAVENOUS | Status: AC
  Filled 2017-08-27: qty 5

## 2017-08-27 NOTE — Progress Notes (Signed)
Gary Nelson tolerated portacath flush well without complaints or incident. Port accessed with 20 gauge power port needle without blood return noted but flushed easily with NS without swelling or discomfort and left accessed for PET scan then after scan completed flushed with 10 ml NS and 5 ml Heparin easily per protocol then de-accessed. VSS Pt discharged via wheelchair in satisfactory condition accompanied by family member

## 2017-08-27 NOTE — Patient Instructions (Signed)
[place] at [facility] Discharge Instructions  Portacath accessed for PET scan then flushed and de-accessed per protocol. Follow-up as scheduled. Call clinic for any questions or concerns   Thank you for choosing [place] at [facility] to provide your oncology and hematology care.  To afford each patient quality time with our provider, please arrive at least 15 minutes before your scheduled appointment time.   If you have a lab appointment with the [facility] please come in thru the  Main Entrance and check in at the main information desk  You need to re-schedule your appointment should you arrive 10 or more minutes late.  We strive to give you quality time with our providers, and arriving late affects you and other patients whose appointments are after yours.  Also, if you no show three or more times for appointments you may be dismissed from the clinic at the providers discretion.     Again, thank you for choosing [facility].  Our hope is that these requests will decrease the amount of time that you wait before being seen by our physicians.       _____________________________________________________________  Should you have questions after your visit to [facility], please contact our office at [phone] between the hours of 8:30 a.m. and 4:30 p.m.  Voicemails left after 4:30 p.m. will not be returned until the following business day.  For prescription refill requests, have your pharmacy contact our office.       Resources For Cancer Patients and their Caregivers ? American Cancer Society: Can assist with transportation, wigs, general needs, runs Look Good Feel Better.        (510)786-1405 ? Cancer Care: Provides financial assistance, online support groups, medication/co-pay assistance.  1-800-813-HOPE 3433655324) ? [place] Assists [place] cancer patients and their  families through emotional , educational and financial support.  (256)725-7872 ? [place] DSS Where to apply for food stamps, Medicaid and utility assistance. 631-689-9605 ? [employer]: Transportation to medical appointments. (253) 817-4331 ? Social Security Administration: May apply for disability if have a Stage IV cancer. (909)515-5059 872 766 8683 ? [employer], Disability and Transit Services: Assists with nutrition, care and transit needs. [place] Support Programs:   > Cancer Support Group  2nd Tuesday of the month 1pm-2pm, Journey Room   > Creative Journey  3rd Tuesday of the month 1130am-1pm, Journey Room

## 2017-08-28 LAB — CEA: CEA: 3.9 ng/mL (ref 0.0–4.7)

## 2017-09-04 ENCOUNTER — Inpatient Hospital Stay (HOSPITAL_COMMUNITY): Payer: Medicare Other

## 2017-09-04 ENCOUNTER — Other Ambulatory Visit: Payer: Self-pay

## 2017-09-04 ENCOUNTER — Inpatient Hospital Stay (HOSPITAL_BASED_OUTPATIENT_CLINIC_OR_DEPARTMENT_OTHER): Payer: Medicare Other | Admitting: Internal Medicine

## 2017-09-04 ENCOUNTER — Encounter (HOSPITAL_COMMUNITY): Payer: Medicare Other

## 2017-09-04 ENCOUNTER — Encounter (HOSPITAL_COMMUNITY): Payer: Self-pay | Admitting: Internal Medicine

## 2017-09-04 VITALS — BP 139/38 | HR 73 | Temp 97.7°F | Resp 20 | Wt 268.5 lb

## 2017-09-04 DIAGNOSIS — I10 Essential (primary) hypertension: Secondary | ICD-10-CM | POA: Diagnosis not present

## 2017-09-04 DIAGNOSIS — E1122 Type 2 diabetes mellitus with diabetic chronic kidney disease: Secondary | ICD-10-CM

## 2017-09-04 DIAGNOSIS — N189 Chronic kidney disease, unspecified: Secondary | ICD-10-CM

## 2017-09-04 DIAGNOSIS — Z87891 Personal history of nicotine dependence: Secondary | ICD-10-CM

## 2017-09-04 DIAGNOSIS — K769 Liver disease, unspecified: Secondary | ICD-10-CM

## 2017-09-04 DIAGNOSIS — Z79899 Other long term (current) drug therapy: Secondary | ICD-10-CM | POA: Diagnosis not present

## 2017-09-04 DIAGNOSIS — Z9221 Personal history of antineoplastic chemotherapy: Secondary | ICD-10-CM

## 2017-09-04 DIAGNOSIS — C7801 Secondary malignant neoplasm of right lung: Secondary | ICD-10-CM

## 2017-09-04 DIAGNOSIS — C189 Malignant neoplasm of colon, unspecified: Secondary | ICD-10-CM | POA: Diagnosis not present

## 2017-09-04 DIAGNOSIS — Z923 Personal history of irradiation: Secondary | ICD-10-CM

## 2017-09-04 DIAGNOSIS — Z86718 Personal history of other venous thrombosis and embolism: Secondary | ICD-10-CM

## 2017-09-04 DIAGNOSIS — Z7982 Long term (current) use of aspirin: Secondary | ICD-10-CM | POA: Diagnosis not present

## 2017-09-04 DIAGNOSIS — J90 Pleural effusion, not elsewhere classified: Secondary | ICD-10-CM | POA: Diagnosis not present

## 2017-09-04 DIAGNOSIS — Z794 Long term (current) use of insulin: Secondary | ICD-10-CM

## 2017-09-04 NOTE — Patient Instructions (Signed)
[facility] at [facility] Discharge Instructions  Today you saw Dr. Higgs   Thank you for choosing [facility] at [facility] to provide your oncology and hematology care.  To afford each patient quality time with our provider, please arrive at least 15 minutes before your scheduled appointment time.   If you have a lab appointment with the Cancer Center please come in thru the  Main Entrance and check in at the main information desk  You need to re-schedule your appointment should you arrive 10 or more minutes late.  We strive to give you quality time with our providers, and arriving late affects you and other patients whose appointments are after yours.  Also, if you no show three or more times for appointments you may be dismissed from the clinic at the providers discretion.     Again, thank you for choosing [facility].  Our hope is that these requests will decrease the amount of time that you wait before being seen by our physicians.       _____________________________________________________________  Should you have questions after your visit to [facility], please contact our office at [phone] between the hours of 8:00 a.m. and 4:30 p.m.  Voicemails left after 4:00 p.m. will not be returned until the following business day.  For prescription refill requests, have your pharmacy contact our office and allow 72 hours.    Cancer Center Support Programs:   > Cancer Support Group  2nd Tuesday of the month 1pm-2pm, Journey Room   

## 2017-09-04 NOTE — Progress Notes (Addendum)
Chemotherapy teaching pulled together. Patient taught today during his office visit.  Patient knows that his medication will be sent to a specialty pharmacy.  He is to call our office when he receives the medication.

## 2017-09-04 NOTE — Patient Instructions (Addendum)
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 We are going to be treating you with the intent of controlling your disease.  This means that you are treatable but not curable.  You will be taking Xeloda (Capecitabine).  This is a pill that you will be taking daily.  It will come from a specialized pharmacy and will be delivered to your home.  You will see the doctor regularly throughout treatment.  We monitor your lab work prior to every treatment. The doctor monitors your response to treatment by the way you are feeling, your blood work, and scans periodically.   Capecitabine (Xeloda)  About This Drug Capecitabine is used to treat cancer. It is given orally (by mouth).  Possible Side Effects . Decrease in red blood cells. This may make you feel more tired. . Nausea and throwing up (vomiting) . Pain in your abdomen . Diarrhea (loose bowel movements) . Tiredness and weakness . Increased total bilirubin in your blood. This may mean that you have changes in your liver function. . Hand-foot syndrome. The palms of your hands or soles of your feet may tingle, become numb, painful, swollen, or red. Note: Each of the side effects above was reported in 30% or greater of patients treated with capecitabine. Not all possible side effects are included above.  Warnings and Precautions . Abnormal bleeding if you are taking blood thinners such as warfarin - symptoms may be coughing up blood, throwing up blood (may look like coffee grounds), red or black tarry bowel movements, abnormally heavy menstrual flow, nosebleeds or any other unusual bleeding. . Severe diarrhea . Changes in the tissue of the heart and/or heart attack. Some changes may happen that can cause your heart to have less ability to pump blood. . Increase risk of severe side effects if you have a known dihydropyrimidine dehydrogenase deficiency. . Dehydration (lack of water in the  body from losing too much fluid), which may affect how your kidneys work which can be life-threatening. . Severe allergic skin reaction. You may develop blisters on your skin that are filled with fluid or a severe red rash all over your body that may be painful. . Decrease in the number of white blood cells, red blood cells, and platelets. This may raise your risk of infection, make you tired and weak (fatigue), and raise your risk of bleeding. . Patients greater than 51 years of age are at increased risk of severe and life-threatening side effects. . Changes in your liver function, which can cause liver failure. Note: Some of the side effects above are very rare. If you have concerns and/or questions, please discuss them with your medical team.  How to Take Your Medication . Swallow the medicine whole with water within 30 minutes after a meal. Do not break or crush it. . Missed dose: If you vomit or miss a dose, take your next dose at the regular time, and contact your doctor. Do not take 2 doses at the same time and do not double up on the next dose. Marland Kitchen Handling: Wash your hands after handling your medicine, your caretakers should not handle your medicine with bare hands and should wear latex gloves. . This drug may be present in the saliva, tears, sweat, urine, stool, vomit, semen, and vaginal secretions. Talk to your doctor and/or your nurse about the necessary precautions to take during this time. . Storage: Store this medicine in the original container at room  temperature. Keep lid tightly closed. . Disposal of unused medicine: Do not flush any expired and/or unused medicine down the toilet or drain unless you are specifically instructed to do so on the medication label. Some facilities have take-back programs and/or other options. If you do not have a take-back program in your area, then please discuss with your nurse or your doctor how to dispose of unused medicine.  Treating Side  Effects . Drink plenty of fluids (a minimum of eight glasses per day is recommended). . If you throw up or have loose bowel movements, you should drink more fluids so that you do not become dehydrated (lack of water in the body from losing too much fluid). . If you have diarrhea, eat low-fiber foods that are high in protein and calories and avoid foods that can irritate your digestive tracts or lead to cramping. . Ask your nurse or doctor about medicine that can lessen or stop your diarrhea. . To help with nausea and vomiting, eat small, frequent meals instead of three large meals a day. Choose foods and drinks that are at room temperature. Ask your nurse or doctor about other helpful tips and medicine that is available to help stop or lessen these symptoms. . Manage tiredness by pacing your activities for the day. . Be sure to include periods of rest between energy-draining activities. . To decrease the risk of infection, wash your hands regularly. . Avoid close contact with people who have a cold, the flu, or other infections. . Take your temperature as your doctor or nurse tells you, and whenever you feel like you may have a fever. . To help decrease the risk of bleeding, use a soft toothbrush. Check with your nurse before using dental floss. . Be very careful when using knives or tools. . Use an electric shaver instead of a razor. Marland Kitchen Keeping your pain under control is important to your well-being. Please tell your doctor or nurse if you are experiencing pain. . Avoid sun exposure and apply sunscreen routinely when outdoors. . If you get a rash do not put anything on it unless your doctor or nurse says you may. Keep the area around the rash clean and dry. Ask your doctor for medicine if your rash bothers you.  Food and Drug Interactions . There are no known interactions of capecitabine with food, however this medication should be taken within 30 minutes after a meal. . Check with your  doctor or pharmacist about all other prescription medicines and over-the-counter medicines and dietary supplements (vitamins, minerals, herbs and others) you are taking before starting this medicine as there are known drug interactions with capecitabine. Also, check with your doctor or pharmacist before starting any new prescription or over-the-counter medicines, or dietary supplements to make sure that there are no interactions. . There are known interactions of capecitabine with blood thinning medicine such as warfarin. Ask your doctor what precautions you should take.  When to Call the Doctor Call your doctor or nurse if you have any of these symptoms and/or any new or unusual symptoms: . Fever of 100.4 F (38 C) or higher . Chills . Trouble breathing . Feeling that your heart is beating in a fast or not normal way (palpitations) . Pain in your chest . Chest pain or symptoms of a heart attack. Most heart attacks involve pain in the center of the chest that lasts more than a few minutes. The pain may go away and come back or it can be  constant. It can feel like pressure, squeezing, fullness, or pain. Sometimes pain is felt in one or both arms, the back, neck, jaw, or stomach. If any of these symptoms last 2 minutes, call 911. . Tiredness that interferes with your daily activities . Feeling dizzy or lightheaded . Easy bleeding or bruising . Blood in your urine, vomit (bright red or coffee-ground) and/or stools ( bright red, or black/tarry) . Coughing up blood . Decreased urine . Nausea that stops you from eating or drinking and/or is not relieved by prescribed medicines . Throwing up more than 3 times a day . Lasting loss of appetite or rapid weight loss of five pounds in a week . Diarrhea, 4 times in one day or diarrhea with lack of strength or a feeling of being dizzy . Pain that does not go away or is not relieved by prescribed medicines

## 2017-09-05 ENCOUNTER — Other Ambulatory Visit (HOSPITAL_COMMUNITY)
Admission: RE | Admit: 2017-09-05 | Discharge: 2017-09-05 | Disposition: A | Discharge status: Home / Self Care | Payer: Medicare Other | Source: Ambulatory Visit | Attending: Internal Medicine | Admitting: Internal Medicine

## 2017-09-05 DIAGNOSIS — C189 Malignant neoplasm of colon, unspecified: Secondary | ICD-10-CM | POA: Insufficient documentation

## 2017-09-05 DIAGNOSIS — K6389 Other specified diseases of intestine: Secondary | ICD-10-CM | POA: Insufficient documentation

## 2017-09-06 ENCOUNTER — Telehealth (HOSPITAL_COMMUNITY): Payer: Self-pay | Admitting: Internal Medicine

## 2017-09-06 ENCOUNTER — Encounter: Payer: Self-pay | Admitting: Radiation Oncology

## 2017-09-06 MED ORDER — CAPECITABINE 500 MG PO TABS: ORAL_TABLET | ORAL | 4 refills | Status: DC

## 2017-09-06 NOTE — Progress Notes (Signed)
Diagnosis No diagnosis found.  Staging Cancer Staging Adenocarcinoma of colon [facility]) Staging form: Colon and Rectum, AJCC 7th Edition - Clinical stage from 10/04/2012: Stage IIA (T3, N0, M0) - Signed by Baird Cancer, PA-C on 07/15/2015 - Pathologic stage from 09/17/2015: Stage IVA (T3, N0, M1a) - Signed by Baird Cancer, PA-C on 10/05/2015   Assessment and Plan:  1.  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 [name] followed by 6 months worth of adjuvant chemotherapy consisting of FOLFOX with a change in treatment to infusional 5-FU for progressive peripheral neuropathy to finish 6 months worth of treatment by Dr. [name].  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 [name].  Pt was last treated with SBRT of right lung from 01/02/2017 to 01/12/2017.    CAP done 05/29/2017 and showed  IMPRESSION: 1. Area of presumed post radiation change in the medial posterior right lower lobe measures minimally larger today although there is some adjacent atelectasis on today's exam and a new tiny right pleural effusion. Continued close attention on follow-up recommended. 2. No new or progressive findings in the abdomen/pelvis to suggest metastatic disease. 3. 11 mm hyperattenuating posterior right liver lesion unchanged. 4. Previously identified right omental nodule is stable. 5.  Aortic Atherosclerois (ICD10-170.0) 6.  Emphysema. (NID78-E42.9)  Pt is here to go over PET scan that was done 08/27/2017 and showed  IMPRESSION: 1. Focal hypermetabolism (max SUV 5.3) within masslike focus of consolidation in the medial basilar right lower lobe, suspicious for residual/recurrent tumor. 2. No additional hypermetabolic sites of  metastatic disease. 3. New tiny 3 mm solid anterior right upper lobe pulmonary nodule, not definitely seen on prior chest CT, below PET resolution. Recommend attention on follow-up chest CT in 3 months. 4. Trace dependent right pleural effusion. 5.  Aortic Atherosclerosis (ICD10-I70.0).  Long talk held with the patient and family member regarding the PET scan findings.  I discussed with them that there is suspicion of residual/recurrent cancer based on the recent PET scan.  There are also new tiny lesions in the RUL that are below PET resolution.  He will be referred back to Dr. [name] of radiation for evaluation to determine if additional radiation will be recommended..  I, however have discussed with him that due to known evidence of metastatic involvement of the lung from colorectal cancer it would be reasonable to place him on systemic therapy.  I have asked the colorectal navigator to send specimen from 2017 for colorectal biomarkers.  I have discussed with him options of initial therapy with Xeloda to assess tolerance of therapy as he has reportedly had problems with chemotherapy in the past and has a decreased performance status.  Side effects of Xeloda reviewed.  Xeloda will be dosed at 1000 mg/m twice a day for 14 days with 1 week off.  Based on results of the Avex  trial, will consider the addition of Avastin dosed at 7.5 mg/kg every 21 days pending tolerance of Xeloda.  He is set up for chemotherapy teaching.  He will be set up for repeat imaging in 12/2017.    2.  Right lung lesion.  Pt had CT of chest done 05/29/2017 that showed Medial right lower lobe pleural-based density is similar to prior measuring 3.5 cm long axis today compared  to 2.8 previously. No new pulmonary nodule or mass. Tiny right pleural effusion is new in the interval.  I have reviewed PET scan  that was done 08/27/2017 and showed  IMPRESSION: 1. Focal hypermetabolism (max SUV 5.3) within masslike focus of consolidation  in the medial basilar right lower lobe, suspicious for residual/recurrent tumor. 2. No additional hypermetabolic sites of metastatic disease. 3. New tiny 3 mm solid anterior right upper lobe pulmonary nodule, not definitely seen on prior chest CT, below PET resolution. Recommend attention on follow-up chest CT in 3 months  in 08/2017 for short interval evaluation.    He will be referred back to Dr. [name] of radiation for evaluation to determine if additional radiation will be recommended..  I, however have discussed with him that due to known evidence of metastatic involvement of the lung from colorectal cancer it would be reasonable to place him on systemic therapy.  I have asked the colorectal navigator to send specimen from 2017 for colorectal biomarkers.  I have discussed with him options of initial therapy with Xeloda to assess tolerance of therapy as he has reportedly had problems with chemotherapy in the past and has a decreased performance status.  Xeloda will be dosed at 1000 mg/m twice a day for 14 days with 1 week off. RX for 2500 mg bid for 14 days sent to pharmacy.   Based on results of the Avex  trial, will consider the addition of Avastin dosed at 7.5 mg/kg every 21 days pending tolerance of Xeloda.  He is set up for chemotherapy teaching.  He will be set up for repeat imaging in 12/2017.    3.  PAC questions.  Continue port flushes as directed.     4.  HTN.  BP is 139/38. Follow-up with PCP.    Interval  history: Historical data obtained from the note dated 06/05/2017.  77 y.o. 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 [name] followed by 6 months worth of adjuvant chemotherapy consisting of FOLFOX with a change in treatment to infusional 5-FU  for progressive peripheral neuropathy to finish 6 months worth of treatment by Dr. [name].  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 [name].  Current Status:  Pt is seen today for follow-up.  He is here to go over PET scan.  CT Scan  He is accompanied by family member.    Adenocarcinoma of colon ([place])   10/04/2012 Definitive Surgery    Partial collectomy by Dr. [name]      10/04/2012 Pathology Results    4.5 cm poorly differentiated adenocarcinoma, negative margins, 0/7 lymph nodes for metastatic disease.  Oncotype recurrence score of 26 (high)       Chemotherapy    FOLFOX       Adverse Reaction    Progressive peripheral neuropathy       Chemotherapy    Infusional 5 FU.  Completed 6 months worth of adjuvant therapy.      02/23/2014 Procedure    Colonoscopy by Dr. Anthony Sar.      07/01/2015 PET scan    There is malignant range uptake with RLL pulmonary nodule suspicious for metastatic disease or primary pulm neoplasm. Nonspecific focus of uptake in the L femoral neck. No corresponding CT abnormality. Cannot rule out  metastasis.      07/06/2015 Imaging    MR C-spine- C5-6 there is a broad-based disc bulge w R paracentral disc protrusion deforming the spinal cord. Moderate R foraminal stenosis. C6-7 there is a central disc protrusion slightly eccentric towards the R and mildly deforming the ventral c-spine       09/16/2015 Procedure    RLL needle biopsy by IR.      09/17/2015 Pathology Results    Metastatic adenocarcinoma consistent with a primary colorectal adenocarcinoma.      10/12/2015 - 10/22/2015 Radiation Therapy    SBRT by Dr. [name] to RLL pulmonary lesion (biopsy proven to be oligometastasis).      12/09/2015 Imaging    CT chest- 1. Slight interval decrease in size of the right lower lobe pulmonary nodule. Some surrounding radiation changes. 2. No mediastinal or hilar mass or adenopathy. 3. No new pulmonary nodules. 4. Stable  underline emphysematous changes. 5. Cirrhotic changes involving the liver but no upper abdominal metastatic disease.      03/14/2016 Imaging    CT chest- 1. Continued mild reduction in the size of the treated right lobe pulmonary nodule. 2. Increased patchy consolidation, reticulation and ground-glass attenuation in the basilar right lower lobe, nonspecific, favor evolving postradiation change, recommend attention on follow-up chest CT. 3. No evidence of new or progressive metastatic disease in the chest. 4. Aortic atherosclerosis. Left main and 3 vessel coronary atherosclerosis. 5. Mild emphysema.      06/30/2016 Imaging    CT CAP- 1. The appearance of the treated right lower lobe nodule is essentially unchanged, and there are evolving postradiation changes in the base of the right lower lobe, as above. No definite signs of new metastatic disease are noted elsewhere in the chest, abdomen or pelvis. 2. Aortic atherosclerosis, in addition to left main and 3 vessel coronary artery disease. Assessment for potential risk factor modification, dietary therapy or pharmacologic therapy may be warranted, if clinically indicated. 3. Morphologic changes in the liver suggestive of cirrhosis, as above. 4. Additional incidental findings, similar prior studies, as above.      11/17/2016 Imaging    CT CAP Study Result   CLINICAL DATA:  Stage IV colon cancer originally diagnosed in August 2014 status post partial colectomy, with right lower lobe lung metastasis diagnosed August 2017 status post SBRT completed 10/22/2015. Restaging.  EXAM: CT CHEST, ABDOMEN, AND PELVIS WITH CONTRAST  TECHNIQUE: Multidetector CT imaging of the chest, abdomen and pelvis was performed following the standard protocol during bolus administration of intravenous contrast.  CONTRAST:  118m ISOVUE-300 IOPAMIDOL (ISOVUE-300) INJECTION 61%  COMPARISON:  06/30/2016 CT chest, abdomen and  pelvis.  FINDINGS: CT CHEST FINDINGS  Cardiovascular: Normal heart size. No significant pericardial fluid/thickening. Left main, left anterior descending, left circumflex and right coronary atherosclerosis. Atherosclerotic nonaneurysmal thoracic aorta. Normal caliber pulmonary arteries. No central pulmonary emboli. Right internal jugular MediPort terminates at the junction of the right and left brachiocephalic veins.  Mediastinum/Nodes: No discrete thyroid nodules. Unremarkable esophagus. No pathologically enlarged axillary, mediastinal or hilar lymph nodes.  Lungs/Pleura: No pneumothorax. No pleural effusion. There is a nodular 2.7 x 1.6 cm focus of consolidation in the medial basilar right lower lobe (series 3/ image 108), previously 1.6 x 1.1 cm. No appreciable change in patchy subpleural reticulation in both lungs without significant traction bronchiectasis or frank honeycombing. Mild centrilobular and paraseptal emphysema with mild diffuse bronchial wall thickening. No acute consolidative airspace disease or new significant pulmonary nodules.  Musculoskeletal: No aggressive appearing  focal osseous lesions. Moderate thoracic spondylosis. Stable chronic left scapular deformity.  CT ABDOMEN PELVIS FINDINGS  Hepatobiliary: Normal liver with no liver mass. Cholecystectomy. No biliary ductal dilatation.  Pancreas: Normal, with no mass or duct dilation.  Spleen: Normal size. No mass.  Adrenals/Urinary Tract: Normal adrenals. No hydronephrosis. No renal masses. Normal bladder.  Stomach/Bowel: Grossly normal stomach. Normal caliber small bowel with no small bowel wall thickening. Stable appearance of the appendix, within normal limits. Stable postsurgical changes from partial distal colectomy with intact appearing distal colonic anastomosis. Oral contrast transits to the cecum. No large bowel wall thickening or new pericholecystic fat stranding  .  Vascular/Lymphatic: Atherosclerotic nonaneurysmal abdominal aorta. Patent portal, splenic, hepatic and renal veins. No pathologically enlarged lymph nodes in the abdomen or pelvis.  Reproductive:  Normal size prostate.  Other: No pneumoperitoneum, ascites or focal fluid collection. Stable subcutaneous calcified granulomas throughout the left lower back and gluteal regions.  Musculoskeletal: No aggressive appearing focal osseous lesions. Marked lumbar spondylosis.  IMPRESSION: 1. Nodular focus of consolidation in the medial basilar right lower lobe is increased in size. While this may represent evolving masslike radiation fibrosis, recurrence of the lung metastasis is not excluded. PET-CT would be useful for further evaluation. Otherwise, continued close chest CT surveillance is advised . 2. No additional potential new sites of metastatic disease in the chest, abdomen or pelvis. 3. No evidence of local tumor recurrence at the distal colonic anastomosis. 4. Left main and 3 vessel coronary atherosclerosis . 5. Aortic Atherosclerosis (ICD10-I70.0) and Emphysema (ICD10-J43.9).         11/30/2016 Relapse/Recurrence    PET: IMPRESSION: 1. Enlarging right lower lobe pulmonary nodule and slight increase in the SUV max suggesting residual neoplasm. No new pulmonary lesions. 2. No findings for local recurrence or abdominal/pelvic metastatic disease.      01/02/2017 - 01/12/2017 Radiation Therapy     The patient saw Dr. [name] for SBRT radiation treatment. The tumor in the right lung was treated with a course of stereotactic body radiation treatment. The patient received 50 Gy In 5 fractions at 10 Gy per fraction.           Problem List Patient Active Problem List   Diagnosis Date Noted  . Lung metastasis ([place]) [C78.00] 10/20/2015  . 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 Adenocarcinoma of colon ([place]) [C18.9] 07/15/2015  . Sepsis ([place]) [A41.9]  03/19/2013  . CHF, chronic ([place]) [I50.9] 03/19/2013  . Diarrhea [R19.7] 02/19/2013  . Dvt femoral (deep venous thrombosis) ([place]) [I82.419] 02/19/2013  . HTN (hypertension) [I10] 09/09/2012  . Diabetes mellitus ([place]) [E11.9] 09/09/2012  . Morbid obesity ([place]) [E66.01] 09/09/2012  . Sleep apnea [G47.30] 09/09/2012  . CKD (chronic kidney disease) [N18.9] 09/09/2012    Past Medical History Past Medical History:  Diagnosis Date  . Adenocarcinoma of colon ([place])    RLL -  Pulmonary nodule (06/2015, concerning for mets vs primary; to see RAD-[facility] Dr. [name]); stage 2 adenocarinoma   2014 - Colon Cancer - surgery and chemo  . Blood infection ([place])   . Cystitis   . Diabetes mellitus without complication ([place])    Type II  . DVT (deep venous thrombosis) ([place]) 2014   post op  . Hematuria   . Hypertension   . Neuropathy   . Prostatitis   . Pulmonary nodule   . Sleep apnea     Past Surgical History Past Surgical History:  Procedure Laterality Date  . BACK SURGERY    .  CATARACT EXTRACTION     left  . CATARACT EXTRACTION W/PHACO Right 04/11/2012   Procedure: CATARACT EXTRACTION PHACO AND INTRAOCULAR LENS PLACEMENT (IOC);  Surgeon: Tonny Branch, MD;  Location: [facility];  Service: Ophthalmology;  Laterality: Right;  CDE:62.48  . CERVICAL FUSION     c4-c5  . CHOLECYSTECTOMY    . COLON SURGERY  09/29/12   Colon resection for cancer  . COLONOSCOPY    . EYE SURGERY     cataract  . GIVENS CAPSULE STUDY N/A 03/15/2015   Procedure: GIVENS CAPSULE STUDY;  Surgeon: Rogene Houston, MD;  Location: [facility] SUITE;  Service: Endoscopy;  Laterality: N/A;  730  . LEG SURGERY    . ORIF FEMUR FRACTURE     right  . POSTERIOR CERVICAL FUSION/FORAMINOTOMY N/A 07/27/2015   Procedure: Cervical four to Cervical seven Laminectomy with Cervical four to Thoracic one Dorsal internal fixation and fusion;  Surgeon: Kevan Ny Ditty, MD;  Location: [facility];  Service: Neurosurgery;  Laterality: N/A;  C4 to C7  Laminectomy with C4 to T1 Dorsal internal fixation and fusion    Family History Family History  Problem Relation Age of Onset  . Heart disease Father   . Heart attack Father   . Alzheimer's disease Mother      Social History  reports that he has quit smoking. His smoking use included cigarettes. He started smoking about 62 years ago. He has a 55.00 pack-year smoking history. He has never used smokeless tobacco. He reports that he drinks about 1.8 oz of alcohol per week. He reports that he does not use drugs.  Medications  Current Outpatient Medications:  .  aspirin EC 81 MG tablet, Take 81 mg by mouth daily., Disp: , Rfl:  .  atorvastatin (LIPITOR) 10 MG tablet, Take 10 mg by mouth daily at 6 PM. , Disp: , Rfl:  .  furosemide (LASIX) 20 MG tablet, Take 20 mg by mouth every other day. , Disp: , Rfl:  .  gabapentin (NEURONTIN) 300 MG capsule, Take 2 capsules (600 mg total) by mouth 3 (three) times daily. (Patient taking differently: Take 600 mg by mouth 2 (two) times daily. ), Disp: 180 capsule, Rfl: 2 .  insulin NPH (HUMULIN N,NOVOLIN N) 100 UNIT/ML injection, Inject 15-30 Units into the skin See admin instructions. 25 units every morning and 14 units in the evening, Disp: , Rfl:  .  lisinopril (PRINIVIL,ZESTRIL) 40 MG tablet, Take 40 mg by mouth daily., Disp: , Rfl:  .  naproxen (NAPROSYN) 500 MG tablet, TAKE 1 TABLET BY MOUTH TWICE A DAY WITH MEALS, Disp: , Rfl:  .  nystatin-triamcinolone (MYCOLOG II) cream, Apply topically., Disp: , Rfl:  .  OXYGEN, Inhale 2 L into the lungs continuous. At night time only, Disp: , Rfl:   Allergies Patient has no known allergies.  Review of Systems Review of Systems - Oncology ROS negative   Physical Exam  Vitals Wt Readings from Last 3 Encounters:  09/04/17 268 lb 8 oz (121.8 kg)  06/05/17 276 lb (125.2 kg)  05/29/17 277 lb 8 oz (125.9 kg)   Temp Readings from Last 3 Encounters:  09/04/17 97.7 F (36.5 C) (Oral)  08/27/17 97.7 F (36.5  C) (Oral)  05/29/17 98 F (36.7 C) (Oral)   BP Readings from Last 3 Encounters:  09/04/17 (!) 139/38  08/27/17 (!) 145/56  06/05/17 (!) 161/50   Pulse Readings from Last 3 Encounters:  09/04/17 73  08/27/17 76  06/05/17 88  Constitutional: Well-developed, well-nourished, and in no distress.  Seated in wheelchair. HENT: Head: Normocephalic and atraumatic.  Mouth/Throat: No oropharyngeal exudate. Mucosa moist. Eyes: Pupils are equal, round, and reactive to light. Conjunctivae are normal. No scleral icterus.  Neck: Normal range of motion. Neck supple. No JVD present.  Cardiovascular: Normal rate, regular rhythm and normal heart sounds.  Exam reveals no gallop and no friction rub.   No murmur heard. Pulmonary/Chest: Effort normal and breath sounds normal. No respiratory distress. No wheezes.No rales.  Abdominal: Soft. Bowel sounds are normal. No distension. There is no tenderness. There is no guarding.  Musculoskeletal: No edema or tenderness.  Lymphadenopathy: No cervical, axillary or supraclavicular adenopathy.  Neurological: Alert and oriented to person, place, and time. No cranial nerve deficit.  Skin: Skin is warm and dry. No rash noted. No erythema. No pallor.  Psychiatric: Affect and judgment normal.   Labs No visits with results within 3 Day(s) from this visit.  Latest known visit with results is:  Appointment on 08/27/2017  Component Date Value Ref Range Status  . WBC 08/27/2017 6.8  4.0 - 10.5 K/uL Final  . RBC 08/27/2017 4.18* 4.22 - 5.81 MIL/uL Final  . Hemoglobin 08/27/2017 13.3  13.0 - 17.0 g/dL Final  . HCT 08/27/2017 39.4  39.0 - 52.0 % Final  . MCV 08/27/2017 94.3  78.0 - 100.0 fL Final  . MCH 08/27/2017 31.8  26.0 - 34.0 pg Final  . MCHC 08/27/2017 33.8  30.0 - 36.0 g/dL Final  . RDW 08/27/2017 13.5  11.5 - 15.5 % Final  . Platelets 08/27/2017 192  150 - 400 K/uL Final  . Neutrophils Relative % 08/27/2017 69  % Final  . Neutro Abs 08/27/2017 4.7  1.7 -  7.7 K/uL Final  . Lymphocytes Relative 08/27/2017 18  % Final  . Lymphs Abs 08/27/2017 1.2  0.7 - 4.0 K/uL Final  . Monocytes Relative 08/27/2017 10  % Final  . Monocytes Absolute 08/27/2017 0.7  0.1 - 1.0 K/uL Final  . Eosinophils Relative 08/27/2017 2  % Final  . Eosinophils Absolute 08/27/2017 0.1  0.0 - 0.7 K/uL Final  . Basophils Relative 08/27/2017 1  % Final  . Basophils Absolute 08/27/2017 0.0  0.0 - 0.1 K/uL Final   Performed at [facility], [address] [place],  [address]  . Sodium 08/27/2017 140  135 - 145 mmol/L Final  . Potassium 08/27/2017 4.5  3.5 - 5.1 mmol/L Final  . Chloride 08/27/2017 108  98 - 111 mmol/L Final  . CO2 08/27/2017 24  22 - 32 mmol/L Final  . Glucose, Bld 08/27/2017 135* 70 - 99 mg/dL Final  . BUN 08/27/2017 20  8 - 23 mg/dL Final  . Creatinine, Ser 08/27/2017 1.05  0.61 - 1.24 mg/dL Final  . Calcium 08/27/2017 8.5* 8.9 - 10.3 mg/dL Final  . Total Protein 08/27/2017 6.8  6.5 - 8.1 g/dL Final  . Albumin 08/27/2017 3.4* 3.5 - 5.0 g/dL Final  . AST 08/27/2017 22  15 - 41 U/L Final  . ALT 08/27/2017 16  0 - 44 U/L Final  . Alkaline Phosphatase 08/27/2017 76  38 - 126 U/L Final  . Total Bilirubin 08/27/2017 0.7  0.3 - 1.2 mg/dL Final  . GFR calc non Af Amer 08/27/2017 >60  >60 mL/min Final  . GFR calc Af Amer 08/27/2017 >60  >60 mL/min Final   Comment: (NOTE) The eGFR has been calculated using the CKD EPI equation. This calculation has not been  validated in all clinical situations. eGFR's persistently <60 mL/min signify possible Chronic Kidney Disease.   Georgiann Hahn gap 08/27/2017 8  5 - 15 Final   Performed at [facility], [address] [place], [place] [address]  . LDH 08/27/2017 168  98 - 192 U/L Final   Performed at [facility], [address], [place], [place] [address]  . CEA 08/27/2017 3.9  0.0 - 4.7 ng/mL Final   Comment: (NOTE)                             Nonsmokers          <3.9                             Smokers              <5.6 [employer] Electrochemiluminescence Immunoassay (ECLIA) Values obtained with different assay methods or kits cannot be used interchangeably.  Results cannot be interpreted as absolute evidence of the presence or absence of malignant disease. Performed At: [facility] [place], [place] [id number] Rush Farmer MD [id number]      Pathology No orders of the defined types were placed in this encounter.      Zoila Shutter MD

## 2017-09-06 NOTE — Telephone Encounter (Signed)
FAXED [place]

## 2017-09-10 ENCOUNTER — Telehealth (HOSPITAL_COMMUNITY): Payer: Self-pay | Admitting: Internal Medicine

## 2017-09-10 NOTE — Progress Notes (Signed)
Thoracic Location of Tumor / Histology: Stage II colorectal adenocarcinoma,New tiny 3 mm solid anterior right upper lobe pulmonary nodule      Patient presented  months ago with symptoms BZ:JIRCVEL shortness of breath,dysphagia to solids due to his hiatal hernia and emphysema  Biopsies of  (if applicable) revealed:   FINAL DIAGNOSIS Diagnosis 09-16-15 Lung, needle/core biopsy(ies), Right lower lobe - METASTATIC ADENOCARCINOMA, SEE COMMENT. Microscopic Comment The morphology is consistent with a primary colorectal adenocarcinoma. The case was called  Tobacco/Marijuana/Snuff/ETOH use: Former smoker,Alcohol 1.8 oz. Per week  Past/Anticipated interventions by cardiothoracic surgery, if any: No  Past/Anticipated interventions by medical oncology, if any: 09-04-17  Dr. Mathis [facility]   I have asked the colorectal navigator to send specimen from 2017 for colorectal biomarkers.  I have discussed with him options of initial therapy with Xeloda to assess tolerance of therapy as he has reportedly had problems with chemotherapy in the past and has a decreased performance status.  Side effects of Xeloda reviewed.  Xeloda will be dosed at 1000 mg/m twice a day for 14 days with 1 week off.  Based on results of the Avex  trial, will consider the addition of Avastin dosed at 7.5 mg/kg every 21 days pending tolerance of Xeloda.  He is set up for chemotherapy teaching.  He will be set up for repeat imaging in 12/2017.     PET scan that was done 08/27/2017 and showed   IMPRESSION:  1. Focal hypermetabolism (max SUV 5.3) within masslike focus of  consolidation in the medial basilar right lower lobe, suspicious for  residual/recurrent tumor.  2. No additional hypermetabolic sites of metastatic disease.  3. New tiny 3 mm solid anterior right upper lobe pulmonary nodule,  not definitely seen on prior chest CT, below PET resolution.  Recommend attention on follow-up chest CT in 3 months.  4.  Trace dependent right pleural effusion.  5.  Aortic Atherosclerosis (ICD10-I70.0).   CAP done 05/29/2017 and showed  IMPRESSION: 1. Area of presumed post radiation change in the medial posterior right lower lobe measures minimally larger today although there is some adjacent atelectasis on today's exam and a new tiny right pleural effusion. Continued close attention on follow-up recommended. 2. No new or progressive findings in the abdomen/pelvis to suggest metastatic disease. 3. 11 mm hyperattenuating posterior right liver lesion unchanged. 4. Previously identified right omental nodule is stable. 5. Aortic Atherosclerois (ICD10-170.0) 6. Emphysema. (FYB01-B51.9)  Referral to Radiation Oncology  Chemotherapy  Folfox 0-29-14 Adverse Reaction      Progressive peripheral neuropathy    Chemotherapy      Infusional 5 FU.  Completed 6 months worth of adjuvant therapy.     PET scan  07-01-15    There is malignant range uptake with RLL pulmonary nodule suspicious for metastatic disease or primary pulm neoplasm. Nonspecific focus of uptake in the L femoral neck. No corresponding CT abnormality. Cannot rule out metastasis     Signs/Symptoms Weight changes, if any:  Wt Readings from Last 3 Encounters:  09/13/17 270 lb (122.5 kg)  09/04/17 268 lb 8 oz (121.8 kg)  06/05/17 276 lb (125.2 kg)    Respiratory complaints, if any: SOB with exertion,coughing clear secretion  Hemoptysis, if any: No  Pain issues, if any:  6/10 right hip taking naproxen  SAFETY ISSUES: Prior radiation? :01/02/2017 - 01/12/2017 Right lung 10-12-15 10-22-15 Right pulmonary lesion  Pacemaker/ICD? :  Possible current pregnancy?: N/A  Is the patient on methotrexate? :No  Current Complaints /  other details:  77 y.o.malewith a history of Stage IV adenocarcinoma with a positive biopsy for adenocarcinoma of colorectal primary of RLL pulmonary nodule with a history of Stage II colorectal  adenocarcinoma having finished treatment with curative intent in 2014-2015 consisting of partial colectomy on 10/04/2012 by Dr. [name] followed by 6 months worth of adjuvant chemotherapy consisting of FOLFOX with a change in treatment to infusional 5-FU for  progressive peripheral neuropathy to finish 6 months worth of treatment by Dr. [name]. S/P SBRT to biopsy proven oligometastasis to RLL lung  BP 134/60 (BP Location: Right Arm)   Pulse 93   Temp 98.4 F (36.9 C) (Oral)   Ht 5\' 5"  (1.651 m)   Wt 270 lb (122.5 kg)   SpO2 96%   BMI 44.93 kg/m

## 2017-09-10 NOTE — Telephone Encounter (Signed)
Pts Xeloda has a $0 copay. [facility] will reach out to him today to set up delivery.

## 2017-09-11 DIAGNOSIS — L84 Corns and callosities: Secondary | ICD-10-CM | POA: Diagnosis not present

## 2017-09-11 DIAGNOSIS — M79676 Pain in unspecified toe(s): Secondary | ICD-10-CM | POA: Diagnosis not present

## 2017-09-11 DIAGNOSIS — E1142 Type 2 diabetes mellitus with diabetic polyneuropathy: Secondary | ICD-10-CM | POA: Diagnosis not present

## 2017-09-11 DIAGNOSIS — B351 Tinea unguium: Secondary | ICD-10-CM | POA: Diagnosis not present

## 2017-09-12 ENCOUNTER — Encounter (HOSPITAL_COMMUNITY): Payer: Self-pay | Admitting: *Deleted

## 2017-09-12 NOTE — Addendum Note (Signed)
Encounter addended by: Kyung Rudd, MD on: 09/12/2017 9:15 PM  Actions taken: Edit attestation on clinical note

## 2017-09-13 ENCOUNTER — Encounter: Payer: Self-pay | Admitting: Radiation Oncology

## 2017-09-13 ENCOUNTER — Ambulatory Visit
Admission: RE | Admit: 2017-09-13 | Discharge: 2017-09-13 | Disposition: A | Discharge status: Home / Self Care | Payer: Medicare Other | Source: Ambulatory Visit | Attending: Radiation Oncology | Admitting: Radiation Oncology

## 2017-09-13 ENCOUNTER — Other Ambulatory Visit: Payer: Self-pay

## 2017-09-13 VITALS — BP 134/60 | HR 93 | Temp 98.4°F | Ht 65.0 in | Wt 270.0 lb

## 2017-09-13 DIAGNOSIS — I10 Essential (primary) hypertension: Secondary | ICD-10-CM | POA: Insufficient documentation

## 2017-09-13 DIAGNOSIS — Z87891 Personal history of nicotine dependence: Secondary | ICD-10-CM | POA: Insufficient documentation

## 2017-09-13 DIAGNOSIS — C799 Secondary malignant neoplasm of unspecified site: Secondary | ICD-10-CM | POA: Diagnosis not present

## 2017-09-13 DIAGNOSIS — Z79899 Other long term (current) drug therapy: Secondary | ICD-10-CM | POA: Insufficient documentation

## 2017-09-13 DIAGNOSIS — Z85038 Personal history of other malignant neoplasm of large intestine: Secondary | ICD-10-CM | POA: Diagnosis not present

## 2017-09-13 DIAGNOSIS — C7801 Secondary malignant neoplasm of right lung: Secondary | ICD-10-CM

## 2017-09-13 DIAGNOSIS — E1140 Type 2 diabetes mellitus with diabetic neuropathy, unspecified: Secondary | ICD-10-CM | POA: Insufficient documentation

## 2017-09-13 DIAGNOSIS — Z7982 Long term (current) use of aspirin: Secondary | ICD-10-CM | POA: Insufficient documentation

## 2017-09-13 DIAGNOSIS — Z794 Long term (current) use of insulin: Secondary | ICD-10-CM | POA: Diagnosis not present

## 2017-09-13 NOTE — Progress Notes (Signed)
Radiation Oncology         [phone] ________________________________  Name: Gary Nelson MRN: [id number]  Date of Service: 09/13/2017 DOB: February 26, 1940  Post Treatment Note  CC: [name], MD  Zoila Shutter, MD  Diagnosis:   Recurrent metastatic colon cancer.    Interval Since Last Radiation:  8 months  01/02/2017-01/12/2017 SBRT Treatment:The tumor in the right lower lobe was treated with a course of stereotactic body radiation treatment. The patient received 50 Gy In 5 fractions at 10 Gy per fraction.  10/12/2015-10/22/2015 SBRT Treatment: The tumor in the right lower lobe was treated with a course of stereotactic body radiation treatment. The patient received 50Gy In 68fractions at 10G per fraction.    Narrative:  The patient returns today for routine follow-up. In summary, Gary Nelson is a pleasant 77 y.o. patient who was originally cared for by  Dr. [name] after undergoing definitive surgery by Dr. [name] on 10/04/2012 with subsequent recurrence of disease in a pulmonary nodule. He had a biopsy scheduled however did not pursue this, and a PET scan on 07/01/15 revealed a right lower lobe nodule and nonspecific focus of hypermetabolism in the left femoral neck. He went on to receive SBRT with Dr. [name]. He has been followed with serial imaging and his scan in November 2017, February 2018, and May 2018 were all stable scans in the site of his prior right lower lobe treatment. In October 2018 the lesion was noted to have enlarged to 27 x 16 mm, and had previously been 1.6 x 1.1 cm. He underwent PET scan which revealed hypermetabolism in the area of prior treatment in the RLL with an SUV of 6.1. Prior to treatment in 2017 the SUV had been 5.2. And he proceeded with SBRT to this site and completed treatment. He continues to be followed by medical oncology and a recent PET scan on 08/27/17 revealed hypermetabolism of up to 5.3 in the central aspect of the RLL lesion that's been  treated twice. No additional hypermetabolic changes were noted. He comes today to discuss if he has more radiotherapy options. He as also just started Xeloda today and will meet back with Dr. Walden Field at the end of the month to determine whether or not to add Avastin. His case was discussed in thoracic conference today.                           On review of systems, the patient reports that he is doing well overall. He denies any chest pain, shortness of breath, cough, fevers, chills, night sweats, unintended weight changes. He denies any bowel or bladder disturbances, and denies abdominal pain, nausea or vomiting. He denies any new musculoskeletal or joint aches or pains, new skin lesions or concerns. A complete review of systems is obtained and is otherwise negative.  Past Medical History:  Past Medical History:  Diagnosis Date  . Adenocarcinoma of colon ([place])    RLL -  Pulmonary nodule (06/2015, concerning for mets vs primary; to see RAD-ON[facility] Dr. [name]); stage 2 adenocarinoma   2014 - Colon Cancer - surgery and chemo  . Blood infection ([place])   . Cystitis   . Diabetes mellitus without complication ([place])    Type II  . DVT (deep venous thrombosis) ([place]) 2014   post op  . Hematuria   . Hypertension   . Neuropathy   . Prostatitis   . Pulmonary nodule   . Sleep apnea  Past Surgical History: Past Surgical History:  Procedure Laterality Date  . BACK SURGERY    . CATARACT EXTRACTION     left  . CATARACT EXTRACTION W/PHACO Right 04/11/2012   Procedure: CATARACT EXTRACTION PHACO AND INTRAOCULAR LENS PLACEMENT (IOC);  Surgeon: Tonny Branch, MD;  Location: [facility];  Service: Ophthalmology;  Laterality: Right;  CDE:62.48  . CERVICAL FUSION     c4-c5  . CHOLECYSTECTOMY    . COLON SURGERY  09/29/12   Colon resection for cancer  . COLONOSCOPY    . EYE SURGERY     cataract  . GIVENS CAPSULE STUDY N/A 03/15/2015   Procedure: GIVENS CAPSULE STUDY;  Surgeon: Rogene Houston, MD;  Location: [facility];  Service: Endoscopy;  Laterality: N/A;  730  . LEG SURGERY    . ORIF FEMUR FRACTURE     right  . POSTERIOR CERVICAL FUSION/FORAMINOTOMY N/A 07/27/2015   Procedure: Cervical four to Cervical seven Laminectomy with Cervical four to Thoracic one Dorsal internal fixation and fusion;  Surgeon: Kevan Ny Ditty, MD;  Location: [facility];  Service: Neurosurgery;  Laterality: N/A;  C4 to C7 Laminectomy with C4 to T1 Dorsal internal fixation and fusion    Social History:  Social History   Socioeconomic History  . Marital status: Single    Spouse name: Not on file  . Number of children: Not on file  . Years of education: Not on file  . Highest education level: Not on file  Occupational History  . Not on file  Social Needs  . Financial resource strain: Not on file  . Food insecurity:    Worry: Not on file    Inability: Not on file  . Transportation needs:    Medical: Not on file    Non-medical: Not on file  Tobacco Use  . Smoking status: Former Smoker    Packs/day: 1.00    Years: 55.00    Pack years: 55.00    Types: Cigarettes    Start date: 02/07/1955  . Smokeless tobacco: Never Used  . Tobacco comment: quit 2015  Substance and Sexual Activity  . Alcohol use: Yes    Alcohol/week: 3.0 standard drinks    Types: 3 Cans of beer per week  . Drug use: No  . Sexual activity: Yes    Birth control/protection: None  Lifestyle  . Physical activity:    Days per week: Not on file    Minutes per session: Not on file  . Stress: Not on file  Relationships  . Social connections:    Talks on phone: Not on file    Gets together: Not on file    Attends religious service: Not on file    Active member of club or organization: Not on file    Attends meetings of clubs or organizations: Not on file    Relationship status: Not on file  . Intimate partner violence:    Fear of current or ex partner: Not on file    Emotionally abused: Not on file    Physically abused: Not on file     Forced sexual activity: Not on file  Other Topics Concern  . Not on file  Social History Narrative  . Not on file    Family History: Family History  Problem Relation Age of Onset  . Heart disease Father   . Heart attack Father   . Alzheimer's disease Mother      ALLERGIES:  has No Known Allergies.  Meds:  Current Outpatient Medications  Medication Sig Dispense Refill  . aspirin EC 81 MG tablet Take 81 mg by mouth daily.    Marland Kitchen atorvastatin (LIPITOR) 10 MG tablet Take 10 mg by mouth daily at 6 PM.     . capecitabine (XELODA) 500 MG tablet 2500 mg bid for 14 days with 7 days off 140 tablet 4  . furosemide (LASIX) 20 MG tablet Take 20 mg by mouth every other day.     . gabapentin (NEURONTIN) 300 MG capsule Take 2 capsules (600 mg total) by mouth 3 (three) times daily. (Patient taking differently: Take 600 mg by mouth 2 (two) times daily. ) 180 capsule 2  . insulin NPH (HUMULIN N,NOVOLIN N) 100 UNIT/ML injection Inject 15-30 Units into the skin See admin instructions. 25 units every morning and 14 units in the evening    . lisinopril (PRINIVIL,ZESTRIL) 40 MG tablet Take 40 mg by mouth daily.    . naproxen (NAPROSYN) 500 MG tablet TAKE 1 TABLET BY MOUTH TWICE A DAY WITH MEALS    . nystatin-triamcinolone (MYCOLOG II) cream Apply topically.    . OXYGEN Inhale 2 L into the lungs continuous. At night time only     No current facility-administered medications for this encounter.     Physical Findings:  height is 5\' 5"  (1.651 m) and weight is 270 lb (122.5 kg). His oral temperature is 98.4 F (36.9 C). His blood pressure is 134/60 and his pulse is 93. His oxygen saturation is 96%.  Pain Assessment Pain Score: 6  Pain Loc: Hip/10 In general this is a well appearing, obese caucasian male in no acute distress. He's alert and oriented x4 and appropriate throughout the examination. Cardiopulmonary assessment is negative for acute distress and he exhibits normal effort.   Lab Findings: Lab  Results  Component Value Date   WBC 6.8 08/27/2017   HGB 13.3 08/27/2017   HCT 39.4 08/27/2017   MCV 94.3 08/27/2017   PLT 192 08/27/2017     Radiographic Findings: Nm Pet Image Restag (ps) Skull Base To Thigh  Result Date: 08/28/2017 CLINICAL DATA:  Subsequent treatment strategy for stage IV colon cancer with oligometastatic disease to right lower lung lobe status post SBRT initially completed 10/22/2015 with additional SBRT completed 01/12/2017. EXAM: NUCLEAR MEDICINE PET SKULL BASE TO THIGH TECHNIQUE: 14.9 mCi F-18 FDG was injected intravenously. Full-ring PET imaging was performed from the skull base to thigh after the radiotracer. CT data was obtained and used for attenuation correction and anatomic localization. Fasting blood glucose: 117 mg/dl COMPARISON:  11/30/2016 PET-CT. 05/29/2017 CT chest abdomen and pelvis. FINDINGS: Mediastinal blood pool activity: SUV max 3.7 NECK: No hypermetabolic lymph nodes in the neck. Incidental CT findings: none CHEST: There is focal hypermetabolism with max SUV 5.3 within the 3.7 x 1.9 cm masslike focus of consolidation in the medial basilar right lower lobe (series 3/image 129), compared to the previous max SUV 6.1 in this location on 11/30/2016 PET-CT, which measured 4.6 x 1.9 cm on 05/29/2017 chest CT. No additional hypermetabolic pulmonary findings. No hypermetabolic axillary, mediastinal or hilar adenopathy. Incidental CT findings: Right internal jugular MediPort terminates in the upper third of the SVC. Three-vessel coronary atherosclerosis. Atherosclerotic nonaneurysmal thoracic aorta. Trace dependent right pleural effusion. Anterior right upper lobe 3 mm solid pulmonary nodule (series 3/image 95), not definitely seen on prior chest CT, below PET resolution. ABDOMEN/PELVIS: No abnormal hypermetabolic activity within the liver, pancreas, adrenal glands, or spleen. No hypermetabolic lymph nodes in the abdomen or  pelvis. Incidental CT findings: Stable  hyperdense 1.0 cm posterior right liver lobe lesion (series 3/image 143) which is non-hypermetabolic. Cholecystectomy. Continued stability slightly hyperdense 1.0 cm right omental nodule (series 3/image 194), which is non hypermetabolic. Atherosclerotic nonaneurysmal abdominal aorta. SKELETON: No focal hypermetabolic activity to suggest skeletal metastasis. Incidental CT findings: Stable healed deformity in the proximal right femur. Surgical hardware from anterior and posterior cervical spine fusion. Healed deformity in the left scapula. IMPRESSION: 1. Focal hypermetabolism (max SUV 5.3) within masslike focus of consolidation in the medial basilar right lower lobe, suspicious for residual/recurrent tumor. 2. No additional hypermetabolic sites of metastatic disease. 3. New tiny 3 mm solid anterior right upper lobe pulmonary nodule, not definitely seen on prior chest CT, below PET resolution. Recommend attention on follow-up chest CT in 3 months. 4. Trace dependent right pleural effusion. 5.  Aortic Atherosclerosis (ICD10-I70.0). Electronically Signed   By: Ilona Sorrel M.D.   On: 08/28/2017 10:00    Impression/Plan: 1. Recurrent metastatic colon cancer. The last time the patient was seen I had confused the patient as having had a separate lung primary. Rather, his RLL treatments have been for biopsy confirmed colon cancer. After reviewing his case in thoracic conference and by Dr. [name] with his prior treatment plan, and current PET scan, he's not a candidate for additional radiotherapy to the lesion in the RLL. Rather, we agree with systemic Xeloda, and he will see Dr. Walden Field at the end of the month as well to discuss Avastin. Of note Dr. Kathlene Cote in IR did say that if this area enlarged more so in the future, he would consider cryoablation of this.   In a visit lasting 25 minutes, greater than 50% of the time was spent face to face discussing his case, and coordinating the patient's care.    Carola Rhine, PAC

## 2017-09-13 NOTE — Addendum Note (Signed)
Encounter addended by: Malena Edman, RN on: 09/13/2017 1:46 PM  Actions taken: Medication List reviewed, Problem List reviewed, Allergies reviewed

## 2017-09-25 DIAGNOSIS — E119 Type 2 diabetes mellitus without complications: Secondary | ICD-10-CM | POA: Diagnosis not present

## 2017-09-25 DIAGNOSIS — Z794 Long term (current) use of insulin: Secondary | ICD-10-CM | POA: Diagnosis not present

## 2017-09-26 ENCOUNTER — Encounter (HOSPITAL_COMMUNITY): Payer: Self-pay | Admitting: Internal Medicine

## 2017-10-02 ENCOUNTER — Encounter (HOSPITAL_COMMUNITY): Payer: Self-pay | Admitting: Internal Medicine

## 2017-10-02 ENCOUNTER — Inpatient Hospital Stay (HOSPITAL_COMMUNITY): Payer: Medicare Other

## 2017-10-02 ENCOUNTER — Inpatient Hospital Stay (HOSPITAL_COMMUNITY): Payer: Medicare Other | Attending: Internal Medicine | Admitting: Internal Medicine

## 2017-10-02 VITALS — BP 149/55 | HR 93 | Temp 97.6°F | Resp 16 | Wt 269.8 lb

## 2017-10-02 DIAGNOSIS — Z9221 Personal history of antineoplastic chemotherapy: Secondary | ICD-10-CM

## 2017-10-02 DIAGNOSIS — Z5112 Encounter for antineoplastic immunotherapy: Secondary | ICD-10-CM | POA: Insufficient documentation

## 2017-10-02 DIAGNOSIS — J90 Pleural effusion, not elsewhere classified: Secondary | ICD-10-CM | POA: Insufficient documentation

## 2017-10-02 DIAGNOSIS — I10 Essential (primary) hypertension: Secondary | ICD-10-CM | POA: Insufficient documentation

## 2017-10-02 DIAGNOSIS — C7801 Secondary malignant neoplasm of right lung: Secondary | ICD-10-CM

## 2017-10-02 DIAGNOSIS — Z9049 Acquired absence of other specified parts of digestive tract: Secondary | ICD-10-CM | POA: Diagnosis not present

## 2017-10-02 DIAGNOSIS — C189 Malignant neoplasm of colon, unspecified: Secondary | ICD-10-CM | POA: Diagnosis not present

## 2017-10-02 DIAGNOSIS — Z923 Personal history of irradiation: Secondary | ICD-10-CM | POA: Insufficient documentation

## 2017-10-02 LAB — CBC WITH DIFFERENTIAL/PLATELET
Basophils Absolute: 0 10*3/uL (ref 0.0–0.1)
Basophils Relative: 0 %
Eosinophils Absolute: 0.2 10*3/uL (ref 0.0–0.7)
Eosinophils Relative: 2 %
HCT: 33.5 % — ABNORMAL LOW (ref 39.0–52.0)
Hemoglobin: 11.3 g/dL — ABNORMAL LOW (ref 13.0–17.0)
Lymphocytes Relative: 12 %
Lymphs Abs: 1 10*3/uL (ref 0.7–4.0)
MCH: 31 pg (ref 26.0–34.0)
MCHC: 33.7 g/dL (ref 30.0–36.0)
MCV: 91.8 fL (ref 78.0–100.0)
Monocytes Absolute: 0.8 10*3/uL (ref 0.1–1.0)
Monocytes Relative: 10 %
Neutro Abs: 5.8 10*3/uL (ref 1.7–7.7)
Neutrophils Relative %: 76 %
Platelets: 171 10*3/uL (ref 150–400)
RBC: 3.65 MIL/uL — ABNORMAL LOW (ref 4.22–5.81)
RDW: 13 % (ref 11.5–15.5)
WBC: 7.7 10*3/uL (ref 4.0–10.5)

## 2017-10-02 LAB — COMPREHENSIVE METABOLIC PANEL
ALT: 14 U/L (ref 0–44)
AST: 18 U/L (ref 15–41)
Albumin: 3.3 g/dL — ABNORMAL LOW (ref 3.5–5.0)
Alkaline Phosphatase: 74 U/L (ref 38–126)
Anion gap: 7 (ref 5–15)
BUN: 22 mg/dL (ref 8–23)
CO2: 24 mmol/L (ref 22–32)
Calcium: 8 mg/dL — ABNORMAL LOW (ref 8.9–10.3)
Chloride: 103 mmol/L (ref 98–111)
Creatinine, Ser: 1.22 mg/dL (ref 0.61–1.24)
GFR calc Af Amer: >60 mL/min (ref >60)
GFR calc non Af Amer: 56 mL/min — ABNORMAL LOW (ref >60)
Glucose, Bld: 308 mg/dL — ABNORMAL HIGH (ref 70–99)
Potassium: 4.4 mmol/L (ref 3.5–5.1)
Sodium: 134 mmol/L — ABNORMAL LOW (ref 135–145)
Total Bilirubin: 0.7 mg/dL (ref 0.3–1.2)
Total Protein: 6 g/dL — ABNORMAL LOW (ref 6.5–8.1)

## 2017-10-02 NOTE — Progress Notes (Signed)
Patient takes Xeloda for colon cancer. He denies missing any doses and states he has had no side effects except for diarrhea one day. He was able to control the diarrhea with OTC meds .

## 2017-10-02 NOTE — Patient Instructions (Signed)
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 We are going to be treating you with the intent of controlling your disease.  This means that you are treatable but not curable. You are going to be getting Avastin (Bevacizumab) in addition to the chemotherapy pill you are already taking.  You will see the doctor regularly throughout treatment.  We monitor your lab work prior to every treatment. The doctor monitors your response to treatment by the way you are feeling, your blood work, and scans periodically.  There will be wait times while you are here for treatment.  It will take about 30 minutes to 1 hour for your lab work to result.  Then there will be wait times while pharmacy mixes your medications.    Bevacizumab-xxxx (Avastin, MVASI)  About This Drug Bevacizumab-xxxx is used to treat cancer. It is given in the vein (IV).  Possible Side Effects  Teary eyes  Runny/stuffy nose  Nosebleed  Changes in the way food and drinks taste  Headache  Back pain  Protein in your urine  Bleeding in your rectum  Dry skin  A red skin rash which can be peeling or scaling  High blood pressure  Note: Each of the side effects above was reported in 10% or greater of patients treated with bevacizumab-xxxx. Not all possible side effects are included above.  Warnings and Precautions  Perforation or fistula- an abnormal hole in your stomach, intestine, esophagus, or other organ, which can be life-threatening  Slow wound healing, which can be life-threatening  Abnormal bleeding which can be life-threatening - symptoms may be coughing up blood, throwing up blood (may look like coffee grounds), red or black tarry bowel movements, abnormally heavy menstrual flow, nosebleeds or any other unusual bleeding.  Blood clots and events such as stroke and heart attack. A blood clot in your leg may cause your leg to swell, appear red and warm,  and/or cause pain. A blood clot in your lungs may cause trouble breathing, pain when breathing, and/or chest pain.  Severe high blood pressure  Changes in your central nervous system can happen. The central nervous system is made up of your brain and spinal cord. You could feel extreme tiredness, agitation, confusion, hallucinations (see or hear things that are not there), trouble understanding or speaking, loss of control of your bowels or bladder, eyesight changes, numbness or lack of strength to your arms, legs, face, or body, and coma. If you start to have any of these symptoms let your doctor know right away.  Changes to your kidneys, which could cause protein in your urine and kidney failure, which can be life-threatening  While you are getting this drug in your vein (IV), you may have a reaction to the drug. Sometimes you may be given medication to stop or lessen these side effects. Your nurse will check you closely for these signs: fever or shaking chills, flushing, facial swelling, feeling dizzy, headache, trouble breathing, rash, itching, chest tightness, or chest pain. These reactions may happen after your infusion. If this happens, call 911 for emergency care.  In women, changes in your ovaries may happen that may cause menstrual bleeding to become irregular or stop, and may impair fertility  Congestive heart failure - your heart has less ability to pump blood properly.  Note: Some of the side effects above are very rare. If you have concerns and/or questions, please discuss them with your medical team.  Important  Information  Bevacizumab-xxxx may cause slow wound healing. It should not be given for at least 28 days before surgery, and for at least 28 days after surgery and until wound is fully healed. If you must have emergency surgery or have an accident that results in a wound, tell the doctor that you are on bevacizumab-xxxx.  This drug may be present in the saliva,  tears, sweat, urine, stool, vomit, semen, and vaginal secretions. Talk to your doctor and/or your nurse about the necessary precautions to take during this time.  Treating Side Effects  Keeping your pain under control is important to your well-being. Please tell your doctor or nurse if you are experiencing pain.  Taking good care of your mouth may help food taste better and improve your appetite.  If you have a nose bleed, sit with your head tipped slightly forward. Apply pressure by lightly pinching the bridge of your nose between your thumb and forefinger. Call your doctor if you feel dizzy or faint or if the bleeding doesnt stop after 10 to 15 minutes.  To help with dry skin, moisturize your skin several times day.  Avoid sun exposure and apply sunscreen routinely when outdoors.  If you get a rash do not put anything on it unless your doctor or nurse says you may. Keep the area around the rash clean and dry. Ask your doctor for medicine if your rash bothers you.  Infusion reactions may happen after your infusion. If this happens, call 911 for emergency care.  Food and Drug Interactions  There are no known interactions of bevacizumab-xxxx with food.  This drug may interact with other medicines. Tell your doctor and pharmacist about all the medicines and dietary supplements (vitamins, minerals, herbs and others) that you are taking at this time. Also, check with your doctor or pharmacist before starting any new prescription or over-thecounter medicines, or dietary supplements to make sure that there are no interactions.  When to Call the Doctor Call your doctor or nurse if you have any of these symptoms and/or any new or unusual symptoms:  Fever of 100.4 F (38 C) or higher  Chills  Confusion and/or agitation  Hallucinations  Trouble understanding or speaking  Headache that does not go away  Nose bleed that doesnt stop bleeding after 10 -15 minutes  Feeling dizzy  or lightheaded  Blurry vision or changes in your eyesight  Difficulty swallowing  Easy bleeding or bruising  Blood in your urine, vomit (bright red or coffee-ground) and/or stools ( bright red, or black/tarry)  Coughing up blood  Wheezing and/or trouble breathing  Chest pain or symptoms of a heart attack. Most heart attacks involve pain in the center of the chest that lasts more than a few minutes. The pain may go away and come back. It can feel like pressure, squeezing, fullness, or pain. Sometimes pain is felt in one or both arms, the back, neck, jaw, or stomach. If any of these symptoms last 2 minutes, call 911.  Symptoms of a stroke such as sudden numbness or weakness of your face, arm, or leg, mostly on one side of your body; sudden confusion, trouble speaking or understanding; sudden trouble seeing in one or both eyes; sudden trouble walking, feeling dizzy, loss of balance or coordination; or sudden, bad headache with no known cause. If you have any of these symptoms for 2 minutes, call 911.  Numbness or lack of strength to your arms, legs, face, or body  Nausea that stops you  from eating or drinking and/or relieved by prescribed medicine  Throwing up more than 3 times a day  Pain in your abdomen that does not go away  Foamy or bubbly-looking urine  Signs of infusion reaction: fever or shaking chills, flushing, facial swelling, feeling dizzy, headache, trouble breathing, rash, itching, chest tightness, or chest pain.  Pain that does not go away or is not relieved by prescribed medicine  Your leg or arm is swollen, red, warm and/or painful  Swelling of arms, hands, legs and/or feet  Weight gain of 5 pounds in one week (fluid retention)  If you think you may be pregnant  Reproduction Warnings  Pregnancy warning: This drug can have harmful effects on the unborn baby. Women of child bearing potential should use effective methods of birth control during your cancer  treatment and for 6 months after treatment. In women, changes in your ovaries may happen that may cause menstrual bleeding to become irregular or stop, do not assume you cannot get pregnant. Let your doctor know right away if you think you may be pregnant  Breastfeeding warning: Women should not breastfeed during treatment and for 6 months after treatment because this drug could enter the breast milk and cause harm to a breastfeeding baby.  Fertility warning: In women, this drug may affect your ability to have children in the future. Talk with your doctor or nurse if you plan to have children. Ask for information on egg banking.   SELF CARE ACTIVITIES WHILE ON CHEMOTHERAPY:  Hydration Increase your fluid intake 48 hours prior to treatment and drink at least 8 to 12 cups (64 ounces) of water/decaffeinated beverages per day after treatment. You can still have your cup of coffee or soda but these beverages do not count as part of your 8 to 12 cups that you need to drink daily. No alcohol intake.  Medications Continue taking your normal prescription medication as prescribed.  If you start any new herbal or new supplements please let us know first to make sure it is safe.  Mouth Care Have teeth cleaned professionally before starting treatment. Keep dentures and partial plates clean. Use soft toothbrush and do not use mouthwashes that contain alcohol. Biotene is a good mouthwash that is available at most pharmacies or may be ordered by calling [phone]. Use warm salt water gargles (1 teaspoon salt per 1 quart warm water) before and after meals and at bedtime. Or you may rinse with 2 tablespoons of three-percent hydrogen peroxide mixed in eight ounces of water. If you are still having problems with your mouth or sores in your mouth please call the clinic. If you need dental work, please let the doctor know before you go for your appointment so that we can coordinate the best possible time for  you in regards to your chemo regimen. You need to also let your dentist know that you are actively taking chemo. We may need to do labs prior to your dental appointment.  Skin Care Always use sunscreen that has not expired and with SPF (Sun Protection Factor) of 50 or higher. Wear hats to protect your head from the sun. Remember to use sunscreen on your hands, ears, face, & feet.  Use good moisturizing lotions such as udder cream, eucerin, or even Vaseline. Some chemotherapies can cause dry skin, color changes in your skin and nails.     Avoid long, hot showers or baths.  Use gentle, fragrance-free soaps and laundry detergent.  Use moisturizers, preferably creams  or ointments rather than lotions because the thicker consistency is better at preventing skin dehydration. Apply the cream or ointment within 15 minutes of showering. Reapply moisturizer at night, and moisturize your hands every time after you wash them.  Hair Loss (if your doctor says your hair will fall out)   If your doctor says that your hair is likely to fall out, decide before you begin chemo whether you want to wear a wig. You may want to shop before treatment to match your hair color.  Hats, turbans, and scarves can also camouflage hair loss, although some people prefer to leave their heads uncovered. If you go bare-headed outdoors, be sure to use sunscreen on your scalp.  Cut your hair short. It eases the inconvenience of shedding lots of hair, but it also can reduce the emotional impact of watching your hair fall out.  Don't perm or color your hair during chemotherapy. Those chemical treatments are already damaging to hair and can enhance hair loss. Once your chemo treatments are done and your hair has grown back, it's OK to resume dyeing or perming hair. With chemotherapy, hair loss is almost always temporary. But when it grows back, it may be a different color or texture. In older adults who still had hair color before  chemotherapy, the new growth may be completely gray.  Often, new hair is very fine and soft.  Infection Prevention Please wash your hands for at least 30 seconds using warm soapy water. Handwashing is the #1 way to prevent the spread of germs. Stay away from sick people or people who are getting over a cold. If you develop respiratory systems such as green/yellow mucus production or productive cough or persistent cough let us know and we will see if you need an antibiotic. It is a good idea to keep a pair of gloves on when going into grocery stores/[employer] to decrease your risk of coming into contact with germs on the carts, etc. Carry alcohol hand gel with you at all times and use it frequently if out in public. If your temperature reaches 100.5 or higher please call the clinic and let us know.  If it is after hours or on the weekend please go to the ER if your temperature is over 100.5.  Please have your own personal thermometer at home to use.    Sex and bodily fluids If you are going to have sex, a condom must be used to protect the person that isn't taking chemotherapy. Chemo can decrease your libido (sex drive). For a few days after chemotherapy, chemotherapy can be excreted through your bodily fluids.  When using the toilet please close the lid and flush the toilet twice.  Do this for a few day after you have had chemotherapy.   Effects of chemotherapy on your sex life Some changes are simple and won't last long. They won't affect your sex life permanently. Sometimes you may feel:  too tired  not strong enough to be very active  sick or sore   not in the mood  anxious or low Your anxiety might not seem related to sex. For example, you may be worried about the cancer and how your treatment is going. Or you may be worried about money, or about how you family are coping with your illness. These things can cause stress, which can affect your interest in sex. It's important to talk to your  partner about how you feel. Remember - the changes to your sex  life don't usually last long. There's usually no medical reason to stop having sex during chemo. The drugs won't have any long term physical effects on your performance or enjoyment of sex. Cancer can't be passed on to your partner during sex  Contraception It's important to use reliable contraception during treatment. Avoid getting pregnant while you or your partner are having chemotherapy. This is because the drugs may harm the baby. Sometimes chemotherapy drugs can leave a man or woman infertile.  This means you would not be able to have children in the future. You might want to talk to someone about permanent infertility. It can be very difficult to learn that you may no longer be able to have children. Some people find counselling helpful. There might be ways to preserve your fertility, although this is easier for men than for women. You may want to speak to a fertility expert. You can talk about sperm banking or harvesting your eggs. You can also ask about other fertility options, such as donor eggs. If you have or have had breast cancer, your doctor might advise you not to take the contraceptive pill. This is because the hormones in it might affect the cancer.  It is not known for sure whether or not chemotherapy drugs can be passed on through semen or secretions from the vagina. Because of this some doctors advise people to use a barrier method if you have sex during treatment. This applies to vaginal, anal or oral sex. Generally, doctors advise a barrier method only for the time you are actually having the treatment and for about a week after your treatment. Advice like this can be worrying, but this does not mean that you have to avoid being intimate with your partner. You can still have close contact with your partner and continue to enjoy sex.  Animals If you have cats or birds we just ask that you not change the litter or change  the cage.  Please have someone else do this for you while you are on chemotherapy.   Food Safety During and After Cancer Treatment Food safety is important for people both during and after cancer treatment. Cancer and cancer treatments, such as chemotherapy, radiation therapy, and stem cell/bone marrow transplantation, often weaken the immune system. This makes it harder for your body to protect itself from foodborne illness, also called food poisoning. Foodborne illness is caused by eating food that contains harmful bacteria, parasites, or viruses.  Foods to avoid Some foods have a higher risk of becoming tainted with bacteria. These include:  Unwashed fresh fruit and vegetables, especially leafy vegetables that can hide dirt and other contaminants  Raw sprouts, such as alfalfa sprouts  Raw or undercooked beef, especially ground beef, or other raw or undercooked meat and poultry  Fatty, fried, or spicy foods immediately before or after treatment.  These can sit heavy on your stomach and make you feel nauseous.  Raw or undercooked shellfish, such as oysters.  Sushi and sashimi, which often contain raw fish.   Unpasteurized beverages, such as unpasteurized fruit juices, raw milk, raw yogurt, or cider  Undercooked eggs, such as soft boiled, over easy, and poached; raw, unpasteurized eggs; or foods made with raw egg, such as homemade raw cookie dough and homemade mayonnaise Simple steps for food safety Shop smart.  Do not buy food stored or displayed in an unclean area.  Do not buy bruised or damaged fruits or vegetables.  Do not buy cans that have cracks, dents,  or bulges.  Pick up foods that can spoil at the end of your shopping trip and store them in a cooler on the way home. Prepare and clean up foods carefully.  Rinse all fresh fruits and vegetables under running water, and dry them with a clean towel or paper towel.  Clean the top of cans before opening them.  After  preparing food, wash your hands for 20 seconds with hot water and soap. Pay special attention to areas between fingers and under nails.  Clean your utensils and dishes with hot water and soap.  Disinfect your kitchen and cutting boards using 1 teaspoon of liquid, unscented bleach mixed into 1 quart of water.   Dispose of old food.  Eat canned and packaged food before its expiration date (the use by or best before date).  Consume refrigerated leftovers within 3 to 4 days. After that time, throw out the food. Even if the food does not smell or look spoiled, it still may be unsafe. Some bacteria, such as Listeria, can grow even on foods stored in the refrigerator if they are kept for too long. Take precautions when eating out.  At restaurants, avoid buffets and salad bars where food sits out for a long time and comes in contact with many people. Food can become contaminated when someone with a virus, often a norovirus, or another bug handles it.  Put any leftover food in a to-go container yourself, rather than having the server do it. And, refrigerate leftovers as soon as you get home.  Choose restaurants that are clean and that are willing to prepare your food as you order it cooked.   MEDICATIONS:                                                                                                                                                                Compazine/Prochlorperazine 10mg  tablet. Take 1 tablet every 6 hours as needed for nausea/vomiting. (This can make you sleepy)   EMLA cream. Apply a quarter size amount to port site 1 hour prior to chemo. Do not rub in. Cover with plastic wrap.   Over-the-Counter Meds:  Colace - 100 mg capsules - take 2 capsules daily.  If this doesn't help then you can increase to 2 capsules twice daily.  Call us if this does not help your bowels move.   Imodium 2mg  capsule. Take 2 capsules after the 1st loose stool and then 1 capsule every 2  hours until you go a total of 12 hours without having a loose stool. Call the [facility] if loose stools continue. If diarrhea occurs at bedtime, take 2 capsules at bedtime. Then take 2 capsules every 4 hours until morning. Call [facility].    Diarrhea Sheet   If  you are having loose stools/diarrhea, please purchase Imodium and begin taking as outlined:  At the first sign of poorly formed or loose stools you should begin taking Imodium (loperamide) 2 mg capsules.  Take two caplets (4mg ) followed by one caplet (2mg ) every 2 hours until you have had no diarrhea for 12 hours.  During the night take two caplets (4mg ) at bedtime and continue every 4 hours during the night until the morning.  Stop taking Imodium only after there is no sign of diarrhea for 12 hours.    Always call the [facility] if you are having loose stools/diarrhea that you can't get under control.  Loose stools/diarrhea leads to dehydration (loss of water) in your body.  We have other options of trying to get the loose stools/diarrhea to stop but you must let us know!   Constipation Sheet  Colace - 100 mg capsules - take 2 capsules daily.  If this doesn't help then you can increase to 2 capsules twice daily.  Please call if the above does not work for you.   Do not go more than 2 days without a bowel movement.  It is very important that you do not become constipated.  It will make you feel sick to your stomach (nausea) and can cause abdominal pain and vomiting.   Nausea Sheet   Compazine/Prochlorperazine 10mg  tablet. Take 1 tablet every 6 hours as needed for nausea/vomiting. (This can make you sleepy)  If you are having persistent nausea (nausea that does not stop) please call the [facility] and let us know the amount of nausea that you are experiencing.  If you begin to vomit, you need to call the [facility] and if it is the weekend and you have vomited more than one time and can't get it to stop-go to the  Emergency Room.  Persistent nausea/vomiting can lead to dehydration (loss of fluid in your body) and will make you feel terrible.   Ice chips, sips of clear liquids, foods that are @ room temperature, crackers, and toast tend to be better tolerated.   SYMPTOMS TO REPORT AS SOON AS POSSIBLE AFTER TREATMENT:   FEVER GREATER THAN 100.5 F  CHILLS WITH OR WITHOUT FEVER  NAUSEA AND VOMITING THAT IS NOT CONTROLLED WITH YOUR NAUSEA MEDICATION  UNUSUAL SHORTNESS OF BREATH  UNUSUAL BRUISING OR BLEEDING  TENDERNESS IN MOUTH AND THROAT WITH OR WITHOUT PRESENCE OF ULCERS  URINARY PROBLEMS  BOWEL PROBLEMS  UNUSUAL RASH      Wear comfortable clothing and clothing appropriate for easy access to any Portacath or PICC line. Let us know if there is anything that we can do to make your therapy better!    What to do if you need assistance after hours or on the weekends: CALL [phone].  HOLD on the line, do not hang up.  You will hear multiple messages but at the end you will be connected with a nurse triage line.  They will contact the doctor if necessary.  Most of the time they will be able to assist you.  Do not call the hospital operator.      I have been informed and understand all of the instructions given to me and have received a copy. I have been instructed to call the clinic [phone] or my family physician as soon as possible for continued medical care, if indicated. I do not have any more questions at this time but understand that I may call the [facility] or  the Patient Navigator at [phone] during office hours should I have questions or need assistance in obtaining follow-up care.

## 2017-10-02 NOTE — Progress Notes (Signed)
avastin teaching pulled together.

## 2017-10-02 NOTE — Addendum Note (Signed)
Addended by: Donetta Potts on: 10/02/2017 01:27 PM   Modules accepted: Orders

## 2017-10-02 NOTE — Progress Notes (Signed)
Diagnosis Adenocarcinoma of colon ([place]) - Plan: CBC with Differential/Platelet, Comprehensive metabolic panel, Lactate dehydrogenase, Urinalysis, dipstick only, CBC with Differential/Platelet, Comprehensive metabolic panel, Lactate dehydrogenase, Urinalysis, dipstick only  Staging Cancer Staging Adenocarcinoma of colon [facility]) Staging form: Colon and Rectum, AJCC 7th Edition - Clinical stage from 10/04/2012: Stage IIA (T3, N0, M0) - Signed by Baird Cancer, PA-C on 07/15/2015 - Pathologic stage from 09/17/2015: Stage IVA (T3, N0, M1a) - Signed by Baird Cancer, PA-C on 10/05/2015   Assessment and Plan:  1.  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 [name] followed by 6 months worth of adjuvant chemotherapy consisting of FOLFOX with a change in treatment to infusional 5-FU for progressive peripheral neuropathy to finish 6 months worth of treatment by Dr. [name].  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 [name].  Pt was last treated with SBRT of right lung from 01/02/2017 to 01/12/2017.    CAP done 05/29/2017 showed  IMPRESSION: 1. Area of presumed post radiation change in the medial posterior right lower lobe measures minimally larger today although there is some adjacent atelectasis on today's exam and a new tiny right pleural effusion. Continued close attention on follow-up recommended. 2. No new or progressive findings in the abdomen/pelvis to suggest metastatic disease. 3. 11 mm hyperattenuating posterior right liver lesion unchanged. 4. Previously identified right omental nodule is stable. 5.  Aortic Atherosclerois (ICD10-170.0) 6.  Emphysema. ([id number]-K99.9)  PET scan that was done 08/27/2017  showed   IMPRESSION: 1. Focal hypermetabolism (max SUV 5.3) within masslike focus of consolidation in the medial basilar right lower lobe, suspicious for residual/recurrent tumor. 2. No additional hypermetabolic sites of metastatic disease. 3. New tiny 3 mm solid anterior right upper lobe pulmonary nodule, not definitely seen on prior chest CT, below PET resolution. Recommend attention on follow-up chest CT in 3 months. 4. Trace dependent right pleural effusion. 5.  Aortic Atherosclerosis (ICD10-I70.0).  I discussed with him that due to known evidence of metastatic involvement of the lung from colorectal cancer he is recommended for systemic therapy.  Pt has begun Xeloda which is dosed at 1000 mg/m twice a day for 14 days with 1 week off.  Based on results of the Avex  trial, I have spoken with him regarding addition of Avastin dosed at 7.5 mg/kg every 21 days with Xeloda.  He is tolerating Xeloda with minimal diarrhea.    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  He has negative NRAS, KRAS and HRAS.    Labs done 10/02/2017 reviewed and showed WBC 7.7 HB 11.3 plts 171,000.  Chemistries WNL with K+ 4.4 Cr 1.22 normal LFTs.  Side effects of Avastin reviewed with pt and he was given written information.  He is set up for chemotherapy teaching for Avastin.  Marland Kitchen   He will RTC in 3 weeks for labs and evaluation.  He will need UA prior to Avastin to assess for proteinuria.   He will be set up for repeat imaging in 12/2017.    2.  Right lung lesion.  Pt had CT of chest done 05/29/2017 that showed Medial right lower lobe pleural-based density is similar to prior measuring 3.5 cm long axis today compared to 2.8 previously. No new pulmonary nodule or mass. Tiny right pleural effusion  is new in the interval.  PET scan  that was done 08/27/2017 showed  IMPRESSION: 1. Focal hypermetabolism (max SUV 5.3) within masslike focus of consolidation in the medial basilar right lower lobe, suspicious  for residual/recurrent tumor. 2. No additional hypermetabolic sites of metastatic disease. 3. New tiny 3 mm solid anterior right upper lobe pulmonary nodule, not definitely seen on prior chest CT, below PET resolution. Recommend attention on follow-up chest CT in 3 months  in 08/2017 for short interval evaluation.    Case was reviewed at tumor conference and pt is not a candidate for additional RT.  He is recommended for systemic therapy.  Pt will continue Xeloda as directed and will begin Avastin every 3 weeks.    3.  PAC.  Continue port flushes as directed.    4.  HTN.  BP is 149/55.  Follow-up with PCP.    Interval  history: Historical data obtained from the note dated 06/05/2017.  77 y.o. 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 [name] followed by 6 months worth of adjuvant chemotherapy consisting of FOLFOX with a change in treatment to infusional 5-FU for progressive peripheral neuropathy to finish 6 months worth of treatment by Dr. [name].  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 [name].  Current Status:  Pt is seen today for follow-up.  He reports he is tolerating Xeloda.  He reports one episode of diarrhea.  He is accompanied by family member.     Adenocarcinoma of colon ([place])   10/04/2012 Definitive Surgery    Partial collectomy by Dr. [name]    10/04/2012 Pathology Results    4.5 cm poorly differentiated adenocarcinoma, negative margins, 0/7 lymph nodes for metastatic disease.  Oncotype recurrence score of 26 (high)     Chemotherapy    FOLFOX     Adverse Reaction    Progressive peripheral neuropathy     Chemotherapy    Infusional 5 FU.  Completed 6 months worth of adjuvant therapy.     02/23/2014 Procedure    Colonoscopy by Dr. Anthony Sar.    07/01/2015 PET scan    There is malignant range uptake with RLL pulmonary nodule suspicious for metastatic disease or primary pulm neoplasm. Nonspecific focus of uptake in the L femoral neck. No corresponding CT abnormality. Cannot rule out metastasis.    07/06/2015 Imaging    MR C-spine- C5-6 there is a broad-based disc bulge w R paracentral disc protrusion deforming the spinal cord. Moderate R foraminal stenosis. C6-7 there is a central disc protrusion slightly eccentric towards the R and mildly deforming the ventral c-spine     09/16/2015 Procedure    RLL needle biopsy by IR.    09/17/2015 Pathology Results    Metastatic adenocarcinoma consistent with a primary colorectal adenocarcinoma.    10/12/2015 - 10/22/2015 Radiation Therapy    SBRT by Dr. [name] to RLL pulmonary lesion (biopsy proven to be oligometastasis).    12/09/2015 Imaging    CT chest- 1. Slight interval decrease in size of the right lower lobe pulmonary nodule. Some surrounding radiation changes. 2. No mediastinal or hilar mass or adenopathy. 3. No new pulmonary nodules. 4. Stable underline emphysematous changes. 5. Cirrhotic changes involving the liver but no upper abdominal metastatic disease.    03/14/2016 Imaging  CT chest- 1. Continued mild reduction in the size of the treated right lobe pulmonary nodule. 2. Increased patchy consolidation, reticulation and ground-glass attenuation in the basilar right lower lobe, nonspecific, favor evolving postradiation change, recommend attention on follow-up chest CT. 3. No evidence of new or progressive metastatic disease in the chest. 4. Aortic atherosclerosis. Left main and 3 vessel coronary atherosclerosis. 5. Mild emphysema.    06/30/2016 Imaging    CT CAP- 1. The appearance of the treated right lower lobe nodule is essentially unchanged, and there are evolving postradiation changes in the base of the right lower  lobe, as above. No definite signs of new metastatic disease are noted elsewhere in the chest, abdomen or pelvis. 2. Aortic atherosclerosis, in addition to left main and 3 vessel coronary artery disease. Assessment for potential risk factor modification, dietary therapy or pharmacologic therapy may be warranted, if clinically indicated. 3. Morphologic changes in the liver suggestive of cirrhosis, as above. 4. Additional incidental findings, similar prior studies, as above.    11/17/2016 Imaging    CT CAP Study Result   CLINICAL DATA:  Stage IV colon cancer originally diagnosed in August 2014 status post partial colectomy, with right lower lobe lung metastasis diagnosed August 2017 status post SBRT completed 10/22/2015. Restaging.  EXAM: CT CHEST, ABDOMEN, AND PELVIS WITH CONTRAST  TECHNIQUE: Multidetector CT imaging of the chest, abdomen and pelvis was performed following the standard protocol during bolus administration of intravenous contrast.  CONTRAST:  152m ISOVUE-300 IOPAMIDOL (ISOVUE-300) INJECTION 61%  COMPARISON:  06/30/2016 CT chest, abdomen and pelvis.  FINDINGS: CT CHEST FINDINGS  Cardiovascular: Normal heart size. No significant pericardial fluid/thickening. Left main, left anterior descending, left circumflex and right coronary atherosclerosis. Atherosclerotic nonaneurysmal thoracic aorta. Normal caliber pulmonary arteries. No central pulmonary emboli. Right internal jugular MediPort terminates at the junction of the right and left brachiocephalic veins.  Mediastinum/Nodes: No discrete thyroid nodules. Unremarkable esophagus. No pathologically enlarged axillary, mediastinal or hilar lymph nodes.  Lungs/Pleura: No pneumothorax. No pleural effusion. There is a nodular 2.7 x 1.6 cm focus of consolidation in the medial basilar right lower lobe (series 3/ image 108), previously 1.6 x 1.1 cm. No appreciable change in patchy subpleural reticulation in  both lungs without significant traction bronchiectasis or frank honeycombing. Mild centrilobular and paraseptal emphysema with mild diffuse bronchial wall thickening. No acute consolidative airspace disease or new significant pulmonary nodules.  Musculoskeletal: No aggressive appearing focal osseous lesions. Moderate thoracic spondylosis. Stable chronic left scapular deformity.  CT ABDOMEN PELVIS FINDINGS  Hepatobiliary: Normal liver with no liver mass. Cholecystectomy. No biliary ductal dilatation.  Pancreas: Normal, with no mass or duct dilation.  Spleen: Normal size. No mass.  Adrenals/Urinary Tract: Normal adrenals. No hydronephrosis. No renal masses. Normal bladder.  Stomach/Bowel: Grossly normal stomach. Normal caliber small bowel with no small bowel wall thickening. Stable appearance of the appendix, within normal limits. Stable postsurgical changes from partial distal colectomy with intact appearing distal colonic anastomosis. Oral contrast transits to the cecum. No large bowel wall thickening or new pericholecystic fat stranding .  Vascular/Lymphatic: Atherosclerotic nonaneurysmal abdominal aorta. Patent portal, splenic, hepatic and renal veins. No pathologically enlarged lymph nodes in the abdomen or pelvis.  Reproductive:  Normal size prostate.  Other: No pneumoperitoneum, ascites or focal fluid collection. Stable subcutaneous calcified granulomas throughout the left lower back and gluteal regions.  Musculoskeletal: No aggressive appearing focal osseous lesions. Marked lumbar spondylosis.  IMPRESSION: 1. Nodular focus of consolidation in the medial basilar right lower lobe is  increased in size. While this may represent evolving masslike radiation fibrosis, recurrence of the lung metastasis is not excluded. PET-CT would be useful for further evaluation. Otherwise, continued close chest CT surveillance is advised . 2. No additional potential new  sites of metastatic disease in the chest, abdomen or pelvis. 3. No evidence of local tumor recurrence at the distal colonic anastomosis. 4. Left main and 3 vessel coronary atherosclerosis . 5. Aortic Atherosclerosis (ICD10-I70.0) and Emphysema (ICD10-J43.9).       11/30/2016 Relapse/Recurrence    PET: IMPRESSION: 1. Enlarging right lower lobe pulmonary nodule and slight increase in the SUV max suggesting residual neoplasm. No new pulmonary lesions. 2. No findings for local recurrence or abdominal/pelvic metastatic disease.    01/02/2017 - 01/12/2017 Radiation Therapy     The patient saw Dr. [name] for SBRT radiation treatment. The tumor in the right lung was treated with a course of stereotactic body radiation treatment. The patient received 50 Gy In 5 fractions at 10 Gy per fraction.       10/05/2017 -  Chemotherapy    The patient had bevacizumab (AVASTIN) 925 mg in sodium chloride 0.9 % 100 mL chemo infusion, 7.5 mg/kg = 925 mg, Intravenous,  Once, 0 of 4 cycles  for chemotherapy treatment.       Problem List Patient Active Problem List   Diagnosis Date Noted  . Lung metastasis ([place]) [C78.00] 10/20/2015  . 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 Adenocarcinoma of colon ([place]) [C18.9] 07/15/2015  . Sepsis ([place]) [A41.9] 03/19/2013  . CHF, chronic ([place]) [I50.9] 03/19/2013  . Diarrhea [R19.7] 02/19/2013  . Dvt femoral (deep venous thrombosis) ([place]) [I82.419] 02/19/2013  . HTN (hypertension) [I10] 09/09/2012  . Diabetes mellitus ([place]) [E11.9] 09/09/2012  . Morbid obesity ([place]) [E66.01] 09/09/2012  . Sleep apnea [G47.30] 09/09/2012  . CKD (chronic kidney disease) [N18.9] 09/09/2012    Past Medical History Past Medical History:  Diagnosis Date  . Adenocarcinoma of colon ([place])    RLL -  Pulmonary nodule (06/2015, concerning for mets vs primary; to see RAD-ONC Dr. [name]); stage 2 adenocarinoma   2014 - Colon Cancer - surgery and chemo  . Blood infection  ([place])   . Cystitis   . Diabetes mellitus without complication ([place])    Type II  . DVT (deep venous thrombosis) ([place]) 2014   post op  . Hematuria   . Hypertension   . Neuropathy   . Prostatitis   . Pulmonary nodule   . Sleep apnea     Past Surgical History Past Surgical History:  Procedure Laterality Date  . BACK SURGERY    . CATARACT EXTRACTION     left  . CATARACT EXTRACTION W/PHACO Right 04/11/2012   Procedure: CATARACT EXTRACTION PHACO AND INTRAOCULAR LENS PLACEMENT (IOC);  Surgeon: Tonny Branch, MD;  Location: [facility] ORS;  Service: Ophthalmology;  Laterality: Right;  CDE:62.48  . CERVICAL FUSION     c4-c5  . CHOLECYSTECTOMY    . COLON SURGERY  09/29/12   Colon resection for cancer  . COLONOSCOPY    . EYE SURGERY     cataract  . GIVENS CAPSULE STUDY N/A 03/15/2015   Procedure: GIVENS CAPSULE STUDY;  Surgeon: Rogene Houston, MD;  Location: [facility] ENDO SUITE;  Service: Endoscopy;  Laterality: N/A;  730  . LEG SURGERY    . ORIF FEMUR FRACTURE     right  . POSTERIOR CERVICAL FUSION/FORAMINOTOMY N/A 07/27/2015   Procedure: Cervical four to Cervical seven Laminectomy with Cervical  four to Thoracic one Dorsal internal fixation and fusion;  Surgeon: Kevan Ny Ditty, MD;  Location: [facility] NEURO ORS;  Service: Neurosurgery;  Laterality: N/A;  C4 to C7 Laminectomy with C4 to T1 Dorsal internal fixation and fusion    Family History Family History  Problem Relation Age of Onset  . Heart disease Father   . Heart attack Father   . Alzheimer's disease Mother      Social History  reports that he has quit smoking. His smoking use included cigarettes. He started smoking about 62 years ago. He has a 55.00 pack-year smoking history. He has never used smokeless tobacco. He reports that he drinks about 3.0 standard drinks of alcohol per week. He reports that he does not use drugs.  Medications  Current Outpatient Medications:  .  aspirin EC 81 MG tablet, Take 81 mg by mouth daily., Disp: , Rfl:   .  atorvastatin (LIPITOR) 10 MG tablet, Take 10 mg by mouth daily at 6 PM. , Disp: , Rfl:  .  capecitabine (XELODA) 500 MG tablet, 2500 mg bid for 14 days with 7 days off, Disp: 140 tablet, Rfl: 4 .  furosemide (LASIX) 20 MG tablet, Take 20 mg by mouth every other day. , Disp: , Rfl:  .  gabapentin (NEURONTIN) 300 MG capsule, Take 2 capsules (600 mg total) by mouth 3 (three) times daily. (Patient taking differently: Take 600 mg by mouth 2 (two) times daily. ), Disp: 180 capsule, Rfl: 2 .  insulin NPH (HUMULIN N,NOVOLIN N) 100 UNIT/ML injection, Inject 15-30 Units into the skin See admin instructions. 25 units every morning and 14 units in the evening, Disp: , Rfl:  .  lisinopril (PRINIVIL,ZESTRIL) 40 MG tablet, Take 40 mg by mouth daily., Disp: , Rfl:  .  naproxen (NAPROSYN) 500 MG tablet, TAKE 1 TABLET BY MOUTH TWICE A DAY WITH MEALS, Disp: , Rfl:  .  nystatin-triamcinolone (MYCOLOG II) cream, Apply topically., Disp: , Rfl:  .  OXYGEN, Inhale 2 L into the lungs continuous. At night time only, Disp: , Rfl:   Allergies Patient has no known allergies.  Review of Systems Review of Systems - Oncology ROS negative other than lip sore and 1 episode of diarrhea.     Physical Exam  Vitals Wt Readings from Last 3 Encounters:  10/02/17 269 lb 12.8 oz (122.4 kg)  09/13/17 270 lb (122.5 kg)  09/04/17 268 lb 8 oz (121.8 kg)   Temp Readings from Last 3 Encounters:  10/02/17 97.6 F (36.4 C) (Oral)  09/13/17 98.4 F (36.9 C) (Oral)  09/04/17 97.7 F (36.5 C) (Oral)   BP Readings from Last 3 Encounters:  10/02/17 (!) 149/55  09/13/17 134/60  09/04/17 (!) 139/38   Pulse Readings from Last 3 Encounters:  10/02/17 93  09/13/17 93  09/04/17 73   Constitutional: Elderly male seated in wheelchair in NAD.   HENT: Head: Normocephalic and atraumatic.  Mouth/Throat: No oropharyngeal exudate. Mucosa moist. Eyes: Pupils are equal, round, and reactive to light. Conjunctivae are normal. No scleral  icterus.  Neck: Normal range of motion. Neck supple. No JVD present.  Cardiovascular: Normal rate, regular rhythm and normal heart sounds.  Exam reveals no gallop and no friction rub.   No murmur heard. Pulmonary/Chest: Effort normal and breath sounds normal. No respiratory distress. No wheezes.No rales.  Abdominal: Soft. Bowel sounds are normal. No distension. There is no tenderness. There is no guarding.  Musculoskeletal: No edema or tenderness.  Lymphadenopathy: No cervical, axillary  or supraclavicular adenopathy.  Neurological: Alert and oriented to person, place, and time. No cranial nerve deficit.  Skin: Skin is warm and dry. No rash noted. No erythema. No pallor.  Psychiatric: Affect and judgment normal.   Labs Appointment on 10/02/2017  Component Date Value Ref Range Status  . WBC 10/02/2017 7.7  4.0 - 10.5 K/uL Final  . RBC 10/02/2017 3.65* 4.22 - 5.81 MIL/uL Final  . Hemoglobin 10/02/2017 11.3* 13.0 - 17.0 g/dL Final  . HCT 10/02/2017 33.5* 39.0 - 52.0 % Final  . MCV 10/02/2017 91.8  78.0 - 100.0 fL Final  . MCH 10/02/2017 31.0  26.0 - 34.0 pg Final  . MCHC 10/02/2017 33.7  30.0 - 36.0 g/dL Final  . RDW 10/02/2017 13.0  11.5 - 15.5 % Final  . Platelets 10/02/2017 171  150 - 400 K/uL Final  . Neutrophils Relative % 10/02/2017 76  % Final  . Neutro Abs 10/02/2017 5.8  1.7 - 7.7 K/uL Final  . Lymphocytes Relative 10/02/2017 12  % Final  . Lymphs Abs 10/02/2017 1.0  0.7 - 4.0 K/uL Final  . Monocytes Relative 10/02/2017 10  % Final  . Monocytes Absolute 10/02/2017 0.8  0.1 - 1.0 K/uL Final  . Eosinophils Relative 10/02/2017 2  % Final  . Eosinophils Absolute 10/02/2017 0.2  0.0 - 0.7 K/uL Final  . Basophils Relative 10/02/2017 0  % Final  . Basophils Absolute 10/02/2017 0.0  0.0 - 0.1 K/uL Final   Performed at [facility], [address], [place], [place] [address]  . Sodium 10/02/2017 134* 135 - 145 mmol/L Final  . Potassium 10/02/2017 4.4  3.5 - 5.1 mmol/L Final  .  Chloride 10/02/2017 103  98 - 111 mmol/L Final  . CO2 10/02/2017 24  22 - 32 mmol/L Final  . Glucose, Bld 10/02/2017 308* 70 - 99 mg/dL Final  . BUN 10/02/2017 22  8 - 23 mg/dL Final  . Creatinine, Ser 10/02/2017 1.22  0.61 - 1.24 mg/dL Final  . Calcium 10/02/2017 8.0* 8.9 - 10.3 mg/dL Final  . Total Protein 10/02/2017 6.0* 6.5 - 8.1 g/dL Final  . Albumin 10/02/2017 3.3* 3.5 - 5.0 g/dL Final  . AST 10/02/2017 18  15 - 41 U/L Final  . ALT 10/02/2017 14  0 - 44 U/L Final  . Alkaline Phosphatase 10/02/2017 74  38 - 126 U/L Final  . Total Bilirubin 10/02/2017 0.7  0.3 - 1.2 mg/dL Final  . GFR calc non Af Amer 10/02/2017 56* >60 mL/min Final  . GFR calc Af Amer 10/02/2017 >60  >60 mL/min Final   Comment: (NOTE) The eGFR has been calculated using the CKD EPI equation. This calculation has not been validated in all clinical situations. eGFR's persistently <60 mL/min signify possible Chronic Kidney Disease.   Georgiann Hahn gap 10/02/2017 7  5 - 15 Final   Performed at [facility], [address], [place], [place] [address]     Pathology Orders Placed This Encounter  Procedures  . CBC with Differential/Platelet    Standing Status:   Future    Standing Expiration Date:   10/03/2018  . Comprehensive metabolic panel    Standing Status:   Future    Standing Expiration Date:   10/03/2018  . Lactate dehydrogenase    Standing Status:   Future    Standing Expiration Date:   10/03/2018  . Urinalysis, dipstick only    Standing Status:   Future    Standing Expiration Date:   10/03/2018  .  CBC with Differential/Platelet    Standing Status:   Future    Standing Expiration Date:   10/03/2018  . Comprehensive metabolic panel    Standing Status:   Future    Standing Expiration Date:   10/03/2018  . Lactate dehydrogenase    Standing Status:   Future    Standing Expiration Date:   10/03/2018  . Urinalysis, dipstick only    Standing Status:   Future    Standing Expiration Date:   10/03/2018       Zoila Shutter MD

## 2017-10-02 NOTE — Patient Instructions (Signed)
[facility] at [facility] Discharge Instructions  You saw Dr. Higgs today.   Thank you for choosing [facility] at [facility] to provide your oncology and hematology care.  To afford each patient quality time with our provider, please arrive at least 15 minutes before your scheduled appointment time.   If you have a lab appointment with the Cancer Center please come in thru the  Main Entrance and check in at the main information desk  You need to re-schedule your appointment should you arrive 10 or more minutes late.  We strive to give you quality time with our providers, and arriving late affects you and other patients whose appointments are after yours.  Also, if you no show three or more times for appointments you may be dismissed from the clinic at the providers discretion.     Again, thank you for choosing [facility].  Our hope is that these requests will decrease the amount of time that you wait before being seen by our physicians.       _____________________________________________________________  Should you have questions after your visit to [facility], please contact our office at [phone] between the hours of 8:00 a.m. and 4:30 p.m.  Voicemails left after 4:00 p.m. will not be returned until the following business day.  For prescription refill requests, have your pharmacy contact our office and allow 72 hours.    Cancer Center Support Programs:   > Cancer Support Group  2nd Tuesday of the month 1pm-2pm, Journey Room    

## 2017-10-03 LAB — CEA: CEA: 4.6 ng/mL (ref 0.0–4.7)

## 2017-10-05 ENCOUNTER — Inpatient Hospital Stay (HOSPITAL_COMMUNITY): Payer: Medicare Other

## 2017-10-05 ENCOUNTER — Encounter (HOSPITAL_COMMUNITY): Payer: Self-pay

## 2017-10-05 ENCOUNTER — Other Ambulatory Visit: Payer: Self-pay

## 2017-10-05 VITALS — BP 151/59 | HR 100 | Temp 97.9°F | Resp 20 | Wt 272.0 lb

## 2017-10-05 DIAGNOSIS — Z9049 Acquired absence of other specified parts of digestive tract: Secondary | ICD-10-CM | POA: Diagnosis not present

## 2017-10-05 DIAGNOSIS — I10 Essential (primary) hypertension: Secondary | ICD-10-CM | POA: Diagnosis not present

## 2017-10-05 DIAGNOSIS — Z5112 Encounter for antineoplastic immunotherapy: Secondary | ICD-10-CM | POA: Diagnosis not present

## 2017-10-05 DIAGNOSIS — C189 Malignant neoplasm of colon, unspecified: Secondary | ICD-10-CM

## 2017-10-05 DIAGNOSIS — C7801 Secondary malignant neoplasm of right lung: Secondary | ICD-10-CM | POA: Diagnosis not present

## 2017-10-05 DIAGNOSIS — J90 Pleural effusion, not elsewhere classified: Secondary | ICD-10-CM | POA: Diagnosis not present

## 2017-10-05 LAB — URINALYSIS, DIPSTICK ONLY
Bilirubin Urine: NEGATIVE
Glucose, UA: >=500 mg/dL — AB
Hgb urine dipstick: NEGATIVE
Ketones, ur: NEGATIVE mg/dL
Leukocytes, UA: NEGATIVE
Nitrite: NEGATIVE
Protein, ur: NEGATIVE mg/dL
Specific Gravity, Urine: 1.012 (ref 1.005–1.030)
pH: 5 (ref 5.0–8.0)

## 2017-10-05 MED ORDER — SODIUM CHLORIDE 0.9 % IV SOLN
Freq: Once | INTRAVENOUS | Status: AC
  Administered 2017-10-05: 12:00:00 via INTRAVENOUS

## 2017-10-05 MED ORDER — SODIUM CHLORIDE 0.9 % IV SOLN
7.3000 mg/kg | Freq: Once | INTRAVENOUS | Status: AC
  Administered 2017-10-05: 900 mg via INTRAVENOUS
  Filled 2017-10-05: qty 4

## 2017-10-05 MED ORDER — HEPARIN SOD (PORK) LOCK FLUSH 100 UNIT/ML IV SOLN
500.0000 [IU] | Freq: Once | INTRAVENOUS | Status: AC | PRN
  Administered 2017-10-05: 500 [IU]

## 2017-10-05 NOTE — Progress Notes (Signed)
Tolerated infusion w/o adverse reaction.  Alert, in no distress.  Discharged via wheelchair in c/o son.

## 2017-10-09 ENCOUNTER — Encounter (HOSPITAL_COMMUNITY): Payer: Self-pay | Admitting: *Deleted

## 2017-10-09 NOTE — Progress Notes (Signed)
Patient called clinic stating that last week he had one day of diarrhea. He states that his buttock is now raw.  He states it is irritating to wipe when he has a bowel movement.  I advised him to keep himself clean and dry and to apply a barrier cream/ointment.  I also talked with Dr. Walden Field and she recommends him using Tucks wipes.    Patient verbalizes understanding.

## 2017-10-16 ENCOUNTER — Encounter (HOSPITAL_COMMUNITY): Payer: Self-pay | Admitting: *Deleted

## 2017-10-16 NOTE — Progress Notes (Signed)
Patient called clinic stating that his rash on buttock is now much worse.  He describes it as "raw skin from my tail to my crotch and up around my right side".  He states that the recommendations made last week have not helped.  He continues to describe his pain as a burning raw skin pain.    I spoke with Dr. Walden Field and she wants to prescribe nystatin cream for patient to apply to affected area twice daily.  I advised patient of this and he states that he has some of that cream already at home and he will begin using it immediately.    Per Dr. Walden Field patient is to return call to clinic next week to advise if his symptoms are improving. Patient verbalizes understanding.

## 2017-10-17 ENCOUNTER — Other Ambulatory Visit (HOSPITAL_COMMUNITY): Payer: Self-pay | Admitting: *Deleted

## 2017-10-18 ENCOUNTER — Encounter (HOSPITAL_COMMUNITY): Payer: Self-pay | Admitting: *Deleted

## 2017-10-18 DIAGNOSIS — E1122 Type 2 diabetes mellitus with diabetic chronic kidney disease: Secondary | ICD-10-CM | POA: Diagnosis not present

## 2017-10-18 DIAGNOSIS — I700 Atherosclerosis of aorta: Secondary | ICD-10-CM | POA: Diagnosis not present

## 2017-10-18 DIAGNOSIS — G4730 Sleep apnea, unspecified: Secondary | ICD-10-CM | POA: Diagnosis not present

## 2017-10-18 DIAGNOSIS — I509 Heart failure, unspecified: Secondary | ICD-10-CM | POA: Diagnosis not present

## 2017-10-18 DIAGNOSIS — I130 Hypertensive heart and chronic kidney disease with heart failure and stage 1 through stage 4 chronic kidney disease, or unspecified chronic kidney disease: Secondary | ICD-10-CM | POA: Diagnosis not present

## 2017-10-18 DIAGNOSIS — C7801 Secondary malignant neoplasm of right lung: Secondary | ICD-10-CM | POA: Diagnosis not present

## 2017-10-18 DIAGNOSIS — Z794 Long term (current) use of insulin: Secondary | ICD-10-CM | POA: Diagnosis not present

## 2017-10-18 DIAGNOSIS — Z7982 Long term (current) use of aspirin: Secondary | ICD-10-CM | POA: Diagnosis not present

## 2017-10-18 DIAGNOSIS — I2510 Atherosclerotic heart disease of native coronary artery without angina pectoris: Secondary | ICD-10-CM | POA: Diagnosis not present

## 2017-10-18 DIAGNOSIS — E1142 Type 2 diabetes mellitus with diabetic polyneuropathy: Secondary | ICD-10-CM | POA: Diagnosis not present

## 2017-10-18 DIAGNOSIS — Z87891 Personal history of nicotine dependence: Secondary | ICD-10-CM | POA: Diagnosis not present

## 2017-10-18 DIAGNOSIS — J439 Emphysema, unspecified: Secondary | ICD-10-CM | POA: Diagnosis not present

## 2017-10-18 DIAGNOSIS — L304 Erythema intertrigo: Secondary | ICD-10-CM | POA: Diagnosis not present

## 2017-10-18 DIAGNOSIS — C189 Malignant neoplasm of colon, unspecified: Secondary | ICD-10-CM | POA: Diagnosis not present

## 2017-10-18 DIAGNOSIS — Z86718 Personal history of other venous thrombosis and embolism: Secondary | ICD-10-CM | POA: Diagnosis not present

## 2017-10-18 DIAGNOSIS — M4712 Other spondylosis with myelopathy, cervical region: Secondary | ICD-10-CM | POA: Diagnosis not present

## 2017-10-18 DIAGNOSIS — Z6841 Body Mass Index (BMI) 40.0 and over, adult: Secondary | ICD-10-CM | POA: Diagnosis not present

## 2017-10-18 DIAGNOSIS — N189 Chronic kidney disease, unspecified: Secondary | ICD-10-CM | POA: Diagnosis not present

## 2017-10-18 NOTE — Progress Notes (Signed)
I spoke with Otila Kluver, RN with [facility] and she states that she went out to the home today and patient has brown serosanguinous fluid draining from his wound to groin, perineal area and groin and is very painful .  She applied lanoseptic and ABD pads to keep wound dry.  She is going to keep this treatment for 2 weeks.  Dr. Walden Field  Is aware and okay with treatment.

## 2017-10-19 ENCOUNTER — Encounter (HOSPITAL_COMMUNITY): Payer: Self-pay | Admitting: Emergency Medicine

## 2017-10-19 ENCOUNTER — Emergency Department (HOSPITAL_COMMUNITY)
Admission: EM | Admit: 2017-10-19 | Discharge: 2017-10-19 | Disposition: A | Discharge status: Home / Self Care | Payer: Medicare Other | Attending: Emergency Medicine | Admitting: Emergency Medicine

## 2017-10-19 ENCOUNTER — Other Ambulatory Visit: Payer: Self-pay

## 2017-10-19 ENCOUNTER — Telehealth (HOSPITAL_COMMUNITY): Payer: Self-pay | Admitting: Internal Medicine

## 2017-10-19 DIAGNOSIS — L304 Erythema intertrigo: Secondary | ICD-10-CM | POA: Diagnosis not present

## 2017-10-19 DIAGNOSIS — E1122 Type 2 diabetes mellitus with diabetic chronic kidney disease: Secondary | ICD-10-CM | POA: Diagnosis not present

## 2017-10-19 DIAGNOSIS — C7800 Secondary malignant neoplasm of unspecified lung: Secondary | ICD-10-CM | POA: Diagnosis not present

## 2017-10-19 DIAGNOSIS — J439 Emphysema, unspecified: Secondary | ICD-10-CM | POA: Diagnosis not present

## 2017-10-19 DIAGNOSIS — I129 Hypertensive chronic kidney disease with stage 1 through stage 4 chronic kidney disease, or unspecified chronic kidney disease: Secondary | ICD-10-CM | POA: Insufficient documentation

## 2017-10-19 DIAGNOSIS — C7801 Secondary malignant neoplasm of right lung: Secondary | ICD-10-CM | POA: Diagnosis not present

## 2017-10-19 DIAGNOSIS — Z85038 Personal history of other malignant neoplasm of large intestine: Secondary | ICD-10-CM | POA: Insufficient documentation

## 2017-10-19 DIAGNOSIS — R21 Rash and other nonspecific skin eruption: Secondary | ICD-10-CM | POA: Diagnosis present

## 2017-10-19 DIAGNOSIS — M4712 Other spondylosis with myelopathy, cervical region: Secondary | ICD-10-CM | POA: Diagnosis not present

## 2017-10-19 DIAGNOSIS — Z9049 Acquired absence of other specified parts of digestive tract: Secondary | ICD-10-CM | POA: Diagnosis not present

## 2017-10-19 DIAGNOSIS — C189 Malignant neoplasm of colon, unspecified: Secondary | ICD-10-CM | POA: Diagnosis not present

## 2017-10-19 DIAGNOSIS — Z794 Long term (current) use of insulin: Secondary | ICD-10-CM | POA: Diagnosis not present

## 2017-10-19 DIAGNOSIS — Z7982 Long term (current) use of aspirin: Secondary | ICD-10-CM | POA: Insufficient documentation

## 2017-10-19 DIAGNOSIS — Z87891 Personal history of nicotine dependence: Secondary | ICD-10-CM | POA: Insufficient documentation

## 2017-10-19 DIAGNOSIS — Z79899 Other long term (current) drug therapy: Secondary | ICD-10-CM | POA: Diagnosis not present

## 2017-10-19 DIAGNOSIS — N189 Chronic kidney disease, unspecified: Secondary | ICD-10-CM | POA: Insufficient documentation

## 2017-10-19 DIAGNOSIS — E1142 Type 2 diabetes mellitus with diabetic polyneuropathy: Secondary | ICD-10-CM | POA: Diagnosis not present

## 2017-10-19 LAB — I-STAT CG4 LACTIC ACID, ED: Lactic Acid, Venous: 3.27 mmol/L (ref 0.5–1.9)

## 2017-10-19 LAB — COMPREHENSIVE METABOLIC PANEL
ALT: 13 U/L (ref 0–44)
AST: 19 U/L (ref 15–41)
Albumin: 2.8 g/dL — ABNORMAL LOW (ref 3.5–5.0)
Alkaline Phosphatase: 65 U/L (ref 38–126)
Anion gap: 8 (ref 5–15)
BUN: 26 mg/dL — ABNORMAL HIGH (ref 8–23)
CO2: 20 mmol/L — ABNORMAL LOW (ref 22–32)
Calcium: 7.2 mg/dL — ABNORMAL LOW (ref 8.9–10.3)
Chloride: 105 mmol/L (ref 98–111)
Creatinine, Ser: 1.38 mg/dL — ABNORMAL HIGH (ref 0.61–1.24)
GFR calc Af Amer: 56 mL/min — ABNORMAL LOW (ref >60)
GFR calc non Af Amer: 48 mL/min — ABNORMAL LOW (ref >60)
Glucose, Bld: 248 mg/dL — ABNORMAL HIGH (ref 70–99)
Potassium: 4.2 mmol/L (ref 3.5–5.1)
Sodium: 133 mmol/L — ABNORMAL LOW (ref 135–145)
Total Bilirubin: 1.2 mg/dL (ref 0.3–1.2)
Total Protein: 5.5 g/dL — ABNORMAL LOW (ref 6.5–8.1)

## 2017-10-19 LAB — CBC WITH DIFFERENTIAL/PLATELET
Basophils Absolute: 0 10*3/uL (ref 0.0–0.1)
Basophils Relative: 0 %
Eosinophils Absolute: 0 10*3/uL (ref 0.0–0.7)
Eosinophils Relative: 1 %
HCT: 31.4 % — ABNORMAL LOW (ref 39.0–52.0)
Hemoglobin: 10.8 g/dL — ABNORMAL LOW (ref 13.0–17.0)
Lymphocytes Relative: 7 %
Lymphs Abs: 0.5 10*3/uL — ABNORMAL LOW (ref 0.7–4.0)
MCH: 31.7 pg (ref 26.0–34.0)
MCHC: 34.4 g/dL (ref 30.0–36.0)
MCV: 92.1 fL (ref 78.0–100.0)
Monocytes Absolute: 0.4 10*3/uL (ref 0.1–1.0)
Monocytes Relative: 5 %
Neutro Abs: 5.8 10*3/uL (ref 1.7–7.7)
Neutrophils Relative %: 87 %
Platelets: 230 10*3/uL (ref 150–400)
RBC: 3.41 MIL/uL — ABNORMAL LOW (ref 4.22–5.81)
RDW: 14.8 % (ref 11.5–15.5)
WBC: 6.7 10*3/uL (ref 4.0–10.5)

## 2017-10-19 MED ORDER — FLUCONAZOLE 200 MG PO TABS: 200.0000 mg | ORAL_TABLET | Freq: Every day | ORAL | 0 refills | Status: AC

## 2017-10-19 MED ORDER — FLUCONAZOLE 100 MG PO TABS
100.0000 mg | ORAL_TABLET | Freq: Once | ORAL | Status: AC
  Administered 2017-10-19: 100 mg via ORAL
  Filled 2017-10-19: qty 1

## 2017-10-19 MED ORDER — NYSTATIN 100000 UNIT/GM EX OINT: 1.0000 "application " | TOPICAL_OINTMENT | Freq: Two times a day (BID) | CUTANEOUS | 1 refills | Status: AC

## 2017-10-19 MED ORDER — HYDROCODONE-ACETAMINOPHEN 5-325 MG PO TABS
2.0000 | ORAL_TABLET | Freq: Once | ORAL | Status: AC
  Administered 2017-10-19: 2 via ORAL
  Filled 2017-10-19: qty 2

## 2017-10-19 MED ORDER — NYSTATIN 100000 UNIT/GM EX OINT
TOPICAL_OINTMENT | CUTANEOUS | Status: AC
  Filled 2017-10-19: qty 15

## 2017-10-19 MED ORDER — NYSTATIN 100000 UNIT/GM EX OINT
TOPICAL_OINTMENT | Freq: Two times a day (BID) | CUTANEOUS | Status: DC
  Administered 2017-10-19: 1 via TOPICAL
  Filled 2017-10-19: qty 15

## 2017-10-19 NOTE — Discharge Instructions (Signed)
Please take Diflucan 200mg  by mouth daily for 7 days Apply the topical nystatin ointment twice daily - large amounts - to keep these lesions covered ER for fevers, worsening pain or swelling See your doctor in 2 days for recheck.

## 2017-10-19 NOTE — Telephone Encounter (Signed)
Pt called stating he had a rash on his side and buttock. Along with open sores on his inner thighs. Pt was advised to go to the ED.

## 2017-10-19 NOTE — ED Provider Notes (Addendum)
[facility] EMERGENCY DEPARTMENT Provider Note   CSN: [id number] Arrival date & time: 10/19/17  1539     History   Chief Complaint Chief Complaint  Patient presents with  . Wound Infection    HPI Gary Nelson is a 77 y.o. male.  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  He is currently getting chemotherapy both intravenously and oral and is a diabetic as well.  He reports increasing blood sugars over the last couple of days and a progressive rash in his groin she has been present for 3 weeks, started gradually, gradually worsened and is now become severe involving the area underneath his pannus of his abdomen as well as in the groin areas and down around the scrotum bilaterally.  He just in the last 24 hours has started to use a topical medicine, he does not recall the name of it, he got up over-the-counter at the pharmacy under the recommendation of the home health nurse who has been visiting.  The patient denies fevers and states that he does not feel generally bad at all except for the feeling of the severe rash which is burning in nature and severe and very painful to the touch.  Past Medical History:  Diagnosis Date  . Adenocarcinoma of colon ([place])    RLL -  Pulmonary nodule (06/2015, concerning for mets vs primary; to see [facility] Dr. [name]); stage 2 adenocarinoma   2014 - Colon Cancer - surgery and chemo  . Blood infection ([place])   . Cystitis   . Diabetes mellitus without complication ([place])    Type II  . DVT (deep venous thrombosis) ([place]) 2014   post op  . Hematuria   . Hypertension   . Neuropathy   . Prostatitis   . Pulmonary nodule   . Sleep apnea     Patient Active Problem List   Diagnosis Date Noted  . Lung metastasis ([place]) 10/20/2015  . 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 Adenocarcinoma of colon ([place]) 07/15/2015  . Sepsis ([place]) 03/19/2013  . CHF, chronic  ([place]) 03/19/2013  . Diarrhea 02/19/2013  . Dvt femoral (deep venous thrombosis) ([place]) 02/19/2013  . HTN (hypertension) 09/09/2012  . Diabetes mellitus ([place]) 09/09/2012  . Morbid obesity ([place]) 09/09/2012  . Sleep apnea 09/09/2012  . CKD (chronic kidney disease) 09/09/2012    Past Surgical History:  Procedure Laterality Date  . BACK SURGERY    . CATARACT EXTRACTION     left  . CATARACT EXTRACTION W/PHACO Right 04/11/2012   Procedure: CATARACT EXTRACTION PHACO AND INTRAOCULAR LENS PLACEMENT (IOC);  Surgeon: Tonny Branch, MD;  Location: [facility] ORS;  Service: Ophthalmology;  Laterality: Right;  CDE:62.48  . CERVICAL FUSION     c4-c5  . CHOLECYSTECTOMY    . COLON SURGERY  09/29/12   Colon resection for cancer  . COLONOSCOPY    . EYE SURGERY     cataract  . GIVENS CAPSULE STUDY N/A 03/15/2015   Procedure: GIVENS CAPSULE STUDY;  Surgeon: Rogene Houston, MD;  Location: [facility] ENDO SUITE;  Service: Endoscopy;  Laterality: N/A;  730  . LEG SURGERY    . ORIF FEMUR FRACTURE     right  . POSTERIOR CERVICAL FUSION/FORAMINOTOMY N/A 07/27/2015   Procedure: Cervical four to Cervical seven Laminectomy with Cervical four to Thoracic one Dorsal internal fixation and fusion;  Surgeon: Kevan Ny Ditty, MD;  Location: [facility] NEURO ORS;  Service: Neurosurgery;  Laterality: N/A;  C4 to C7 Laminectomy with C4 to T1 Dorsal internal fixation and fusion        Home Medications    Prior to Admission medications   Medication Sig Start Date End Date Taking? Authorizing Provider  aspirin EC 81 MG tablet Take 81 mg by mouth daily.    [provider]  atorvastatin (LIPITOR) 10 MG tablet Take 10 mg by mouth daily at 6 PM.  06/25/17 06/25/18  [provider]  Bevacizumab (AVASTIN IV) Inject into the vein.    [provider]  capecitabine (XELODA) 500 MG tablet 2500 mg bid for 14 days with 7 days off 09/06/17   Higgs, Mathis Dad, MD  fluconazole (DIFLUCAN) 200 MG tablet Take 1 tablet (200 mg total) by  mouth daily for 7 days. 10/19/17 10/26/17  Noemi Chapel, MD  furosemide (LASIX) 20 MG tablet Take 20 mg by mouth every other day.     [provider]  gabapentin (NEURONTIN) 300 MG capsule Take 2 capsules (600 mg total) by mouth 3 (three) times daily. Patient taking differently: Take 600 mg by mouth 2 (two) times daily.  07/31/15   Ditty, Kevan Ny, MD  insulin NPH (HUMULIN N,NOVOLIN N) 100 UNIT/ML injection Inject 15-30 Units into the skin See admin instructions. 25 units every morning and 14 units in the evening    [provider]  lisinopril (PRINIVIL,ZESTRIL) 40 MG tablet Take 40 mg by mouth daily.    [provider]  naproxen (NAPROSYN) 500 MG tablet TAKE 1 TABLET BY MOUTH TWICE A DAY WITH MEALS 03/05/17   [provider]  nystatin-triamcinolone (MYCOLOG II) cream Apply topically.    [provider]  OXYGEN Inhale 2 L into the lungs continuous. At night time only    [provider]    Family History Family History  Problem Relation Age of Onset  . Heart disease Father   . Heart attack Father   . Alzheimer's disease Mother     Social History Social History   Tobacco Use  . Smoking status: Former Smoker    Packs/day: 1.00    Years: 55.00    Pack years: 55.00    Types: Cigarettes    Start date: 02/07/1955  . Smokeless tobacco: Never Used  . Tobacco comment: quit 2015  Substance Use Topics  . Alcohol use: Not Currently    Alcohol/week: 3.0 standard drinks    Types: 3 Cans of beer per week  . Drug use: No     Allergies   Patient has no known allergies.   Review of Systems Review of Systems  All other systems reviewed and are negative.    Physical Exam Updated Vital Signs BP (!) 122/107 (BP Location: Left Arm)   Pulse 90   Temp 97.6 F (36.4 C) (Oral)   Resp 20   Ht 1.6 m (5\' 3" )   Wt 100.2 kg   SpO2 94%   BMI 39.15 kg/m   Physical Exam  Constitutional: He appears well-developed and well-nourished. No  distress.  HENT:  Head: Normocephalic and atraumatic.  Mouth/Throat: Oropharynx is clear and moist. No oropharyngeal exudate.  Eyes: Pupils are equal, round, and reactive to light. Conjunctivae and EOM are normal. Right eye exhibits no discharge. Left eye exhibits no discharge. No scleral icterus.  Neck: Normal range of motion. Neck supple. No JVD present. No thyromegaly present.  Cardiovascular: Regular rhythm, normal heart sounds and intact distal pulses. Exam reveals no gallop and  no friction rub.  No murmur heard. tachycardia  Pulmonary/Chest: Effort normal and breath sounds normal. No respiratory distress. He has no wheezes. He has no rales.  Abdominal: Soft. Bowel sounds are normal. He exhibits no distension and no mass. There is no tenderness.  Musculoskeletal: Normal range of motion. He exhibits no edema or tenderness.  Lymphadenopathy:    He has no cervical adenopathy.  Neurological: He is alert. Coordination normal.  Skin: Skin is warm and dry. Rash noted. There is erythema.  Severe appearing candidal rash with superficial ulcerations surrounding erythema and satellite lesions lining his bilateral inguinal areas into the groin and the para scrotal area as well as underneath the pannus extending from one hip to the other.  There is no surrounding induration, no purulence, these areas are moist  Psychiatric: He has a normal mood and affect. His behavior is normal.  Nursing note and vitals reviewed.    ED Treatments / Results  Labs (all labs ordered are listed, but only abnormal results are displayed) Labs Reviewed  CBC WITH DIFFERENTIAL/PLATELET - Abnormal; Notable for the following components:      Result Value   RBC 3.41 (*)    Hemoglobin 10.8 (*)    HCT 31.4 (*)    Lymphs Abs 0.5 (*)    All other components within normal limits  COMPREHENSIVE METABOLIC PANEL - Abnormal; Notable for the following components:   Sodium 133 (*)    CO2 20 (*)    Glucose, Bld 248 (*)    BUN  26 (*)    Creatinine, Ser 1.38 (*)    Calcium 7.2 (*)    Total Protein 5.5 (*)    Albumin 2.8 (*)    GFR calc non Af Amer 48 (*)    GFR calc Af Amer 56 (*)    All other components within normal limits  I-STAT CG4 LACTIC ACID, ED - Abnormal; Notable for the following components:   Lactic Acid, Venous 3.27 (*)    All other components within normal limits  I-STAT CG4 LACTIC ACID, ED    EKG None  Radiology No results found.  Procedures Procedures (including critical care time)  Medications Ordered in ED Medications  nystatin ointment (MYCOSTATIN) (has no administration in time range)  fluconazole (DIFLUCAN) tablet 100 mg (has no administration in time range)  fluconazole (DIFLUCAN) tablet 100 mg (100 mg Oral Given 10/19/17 1635)  HYDROcodone-acetaminophen (NORCO/VICODIN) 5-325 MG per tablet 2 tablet (2 tablets Oral Given 10/19/17 1635)     Initial Impression / Assessment and Plan / ED Course  I have reviewed the triage vital signs and the nursing notes.  Pertinent labs & imaging results that were available during my care of the patient were reviewed by me and considered in my medical decision making (see chart for details).    There is definitely increased concern for this patient since he is immunocompromised, he is a diabetic on chemotherapy and has had some blood sugars that have been going up over the last couple of days.  This appears to be a candidal intertrigo which has become quite severe and likely will require both topical and oral medications.  I have prescribed him Diflucan here, he will likely need topical nystatin, labs pending to evaluate for immunocompromise state.  No fevers, no hypotension  I have discussed the patient's care with the oncologist, Dr. Walden Field, she recommends that the patient be followed up in the outpatient clinic, given that his lab work here is unremarkable this is  a reasonable thing, he will need to have his vital signs rechecked, he will have  topical nystatin ointment as well as oral Diflucan, I will discuss with the pharmacist regarding dosing.  The patient is in agreement with this plan.  There does not appear to be any bacterial infections, there is no leukocytosis, his lactic acid is slightly elevated though I do not think this is related to sepsis.  He is not hypotensive and he is not febrile.  The oncologist states that they have already set him up with topical nystatin at home health  Vitals:   10/19/17 1544  BP: (!) 109/45  Pulse: (!) 119  Resp: 20  Temp: 97.6 F (36.4 C)  TempSrc: Oral  SpO2: 96%  Weight: 100.2 kg  Height: 1.6 m (5\' 3" )   Discussed with pharmacist re: treatment of the intertrigo - they recommend Diflucan 200 mg by mouth daily for 7 days - will comply with their recommendations and add they topical nystatin to maximize exposure to medicines - the pt is in agreement -     Vitals:   10/19/17 1544 10/19/17 1915  BP: (!) 109/45 (!) 122/107  Pulse: (!) 119 90  Resp: 20 20  Temp: 97.6 F (36.4 C)   TempSrc: Oral Oral  SpO2: 96% 94%  Weight: 100.2 kg   Height: 1.6 m (5\' 3" )           Final Clinical Impressions(s) / ED Diagnoses   Final diagnoses:  [place]    ED Discharge Orders         Ordered    fluconazole (DIFLUCAN) 200 MG tablet  Daily     10/19/17 1923           Noemi Chapel, MD 10/19/17 Kathyrn Drown    Noemi Chapel, MD 10/19/17 1932

## 2017-10-19 NOTE — ED Notes (Signed)
CRITICAL RESULT  10/19/2017 1655  CG4+  Result: 3.27  MD Notified: Hazle Nordmann

## 2017-10-19 NOTE — ED Triage Notes (Signed)
Patient complaining of open wounds to groin and upper leg areas x 1 week.

## 2017-10-19 NOTE — ED Notes (Signed)
ED Provider at bedside. 

## 2017-10-22 ENCOUNTER — Encounter (HOSPITAL_COMMUNITY): Payer: Self-pay | Admitting: *Deleted

## 2017-10-22 DIAGNOSIS — B372 Candidiasis of skin and nail: Secondary | ICD-10-CM | POA: Diagnosis not present

## 2017-10-22 NOTE — Progress Notes (Signed)
I received a call today from Delight Ovens, NP at [facility]. Patient was seeing her today from followup ER discharge.  She was calling to get recommendations from Dr. Walden Field on his treatment.  Per Dr. Walden Field, patient was afebrile, no elevated WBC while in the ER. She wants to keep him out of the hospital for treatment.  She asked that [place] send patient to outpatient wound clinic and have them send [place] records on treatments.  Patient is to hold treatment both Xeloda and Avastin until candidal intertrigo is healed and we get notes from wound clinic.  Patient was still in her office at this time so patient is aware of recommendations.    His appointments have been cancelled until we hear from wound clinic.  Delight Ovens, NP voices understanding.

## 2017-10-23 ENCOUNTER — Encounter (HOSPITAL_COMMUNITY): Payer: Self-pay | Admitting: *Deleted

## 2017-10-23 DIAGNOSIS — L304 Erythema intertrigo: Secondary | ICD-10-CM | POA: Diagnosis not present

## 2017-10-23 DIAGNOSIS — C189 Malignant neoplasm of colon, unspecified: Secondary | ICD-10-CM | POA: Diagnosis not present

## 2017-10-23 DIAGNOSIS — E1142 Type 2 diabetes mellitus with diabetic polyneuropathy: Secondary | ICD-10-CM | POA: Diagnosis not present

## 2017-10-23 DIAGNOSIS — J439 Emphysema, unspecified: Secondary | ICD-10-CM | POA: Diagnosis not present

## 2017-10-23 DIAGNOSIS — M4712 Other spondylosis with myelopathy, cervical region: Secondary | ICD-10-CM | POA: Diagnosis not present

## 2017-10-23 DIAGNOSIS — C7801 Secondary malignant neoplasm of right lung: Secondary | ICD-10-CM | POA: Diagnosis not present

## 2017-10-23 NOTE — Progress Notes (Signed)
I called to check on patient today and to verify that he was not taking his Xeloda. He states that he actually feels somewhat better. He has not been taking the Xeloda for "some time now".  " that stuff is killing me" He states that he has an appointment with the [facility] on Friday morning 9/20 @ 0845.  I told patient that I would call him back to check on him next week and for him to follow the recommendations of the wound care clinic.  He verbalizes understanding.

## 2017-10-25 DIAGNOSIS — J439 Emphysema, unspecified: Secondary | ICD-10-CM | POA: Diagnosis not present

## 2017-10-25 DIAGNOSIS — M4712 Other spondylosis with myelopathy, cervical region: Secondary | ICD-10-CM | POA: Diagnosis not present

## 2017-10-25 DIAGNOSIS — C7801 Secondary malignant neoplasm of right lung: Secondary | ICD-10-CM | POA: Diagnosis not present

## 2017-10-25 DIAGNOSIS — C189 Malignant neoplasm of colon, unspecified: Secondary | ICD-10-CM | POA: Diagnosis not present

## 2017-10-25 DIAGNOSIS — E1142 Type 2 diabetes mellitus with diabetic polyneuropathy: Secondary | ICD-10-CM | POA: Diagnosis not present

## 2017-10-25 DIAGNOSIS — L304 Erythema intertrigo: Secondary | ICD-10-CM | POA: Diagnosis not present

## 2017-10-26 ENCOUNTER — Other Ambulatory Visit (HOSPITAL_COMMUNITY): Payer: Medicare Other

## 2017-10-26 ENCOUNTER — Ambulatory Visit (HOSPITAL_COMMUNITY): Payer: Medicare Other

## 2017-10-26 ENCOUNTER — Encounter (HOSPITAL_COMMUNITY): Payer: Self-pay | Admitting: *Deleted

## 2017-10-26 DIAGNOSIS — Z7982 Long term (current) use of aspirin: Secondary | ICD-10-CM | POA: Diagnosis not present

## 2017-10-26 DIAGNOSIS — M160 Bilateral primary osteoarthritis of hip: Secondary | ICD-10-CM | POA: Diagnosis present

## 2017-10-26 DIAGNOSIS — L03119 Cellulitis of unspecified part of limb: Secondary | ICD-10-CM | POA: Diagnosis not present

## 2017-10-26 DIAGNOSIS — L03818 Cellulitis of other sites: Secondary | ICD-10-CM | POA: Diagnosis not present

## 2017-10-26 DIAGNOSIS — I110 Hypertensive heart disease with heart failure: Secondary | ICD-10-CM | POA: Diagnosis present

## 2017-10-26 DIAGNOSIS — L03315 Cellulitis of perineum: Secondary | ICD-10-CM | POA: Diagnosis not present

## 2017-10-26 DIAGNOSIS — Z980 Intestinal bypass and anastomosis status: Secondary | ICD-10-CM | POA: Diagnosis not present

## 2017-10-26 DIAGNOSIS — I890 Lymphedema, not elsewhere classified: Secondary | ICD-10-CM | POA: Diagnosis not present

## 2017-10-26 DIAGNOSIS — R600 Localized edema: Secondary | ICD-10-CM | POA: Diagnosis present

## 2017-10-26 DIAGNOSIS — R197 Diarrhea, unspecified: Secondary | ICD-10-CM | POA: Diagnosis not present

## 2017-10-26 DIAGNOSIS — G4733 Obstructive sleep apnea (adult) (pediatric): Secondary | ICD-10-CM | POA: Diagnosis present

## 2017-10-26 DIAGNOSIS — I10 Essential (primary) hypertension: Secondary | ICD-10-CM | POA: Diagnosis not present

## 2017-10-26 DIAGNOSIS — R278 Other lack of coordination: Secondary | ICD-10-CM | POA: Diagnosis not present

## 2017-10-26 DIAGNOSIS — L89153 Pressure ulcer of sacral region, stage 3: Secondary | ICD-10-CM | POA: Diagnosis not present

## 2017-10-26 DIAGNOSIS — R1311 Dysphagia, oral phase: Secondary | ICD-10-CM | POA: Diagnosis not present

## 2017-10-26 DIAGNOSIS — L03314 Cellulitis of groin: Secondary | ICD-10-CM | POA: Diagnosis present

## 2017-10-26 DIAGNOSIS — Z8614 Personal history of Methicillin resistant Staphylococcus aureus infection: Secondary | ICD-10-CM | POA: Diagnosis not present

## 2017-10-26 DIAGNOSIS — R339 Retention of urine, unspecified: Secondary | ICD-10-CM | POA: Diagnosis not present

## 2017-10-26 DIAGNOSIS — E860 Dehydration: Secondary | ICD-10-CM | POA: Diagnosis present

## 2017-10-26 DIAGNOSIS — R262 Difficulty in walking, not elsewhere classified: Secondary | ICD-10-CM | POA: Diagnosis not present

## 2017-10-26 DIAGNOSIS — M6281 Muscle weakness (generalized): Secondary | ICD-10-CM | POA: Diagnosis not present

## 2017-10-26 DIAGNOSIS — Z79899 Other long term (current) drug therapy: Secondary | ICD-10-CM | POA: Diagnosis not present

## 2017-10-26 DIAGNOSIS — Z6841 Body Mass Index (BMI) 40.0 and over, adult: Secondary | ICD-10-CM | POA: Diagnosis not present

## 2017-10-26 DIAGNOSIS — L03115 Cellulitis of right lower limb: Secondary | ICD-10-CM | POA: Diagnosis not present

## 2017-10-26 DIAGNOSIS — F339 Major depressive disorder, recurrent, unspecified: Secondary | ICD-10-CM | POA: Diagnosis not present

## 2017-10-26 DIAGNOSIS — B372 Candidiasis of skin and nail: Secondary | ICD-10-CM | POA: Diagnosis present

## 2017-10-26 DIAGNOSIS — I5032 Chronic diastolic (congestive) heart failure: Secondary | ICD-10-CM | POA: Diagnosis present

## 2017-10-26 DIAGNOSIS — A419 Sepsis, unspecified organism: Secondary | ICD-10-CM | POA: Diagnosis present

## 2017-10-26 DIAGNOSIS — Z794 Long term (current) use of insulin: Secondary | ICD-10-CM | POA: Diagnosis not present

## 2017-10-26 DIAGNOSIS — C189 Malignant neoplasm of colon, unspecified: Secondary | ICD-10-CM | POA: Diagnosis present

## 2017-10-26 DIAGNOSIS — N492 Inflammatory disorders of scrotum: Secondary | ICD-10-CM | POA: Diagnosis not present

## 2017-10-26 DIAGNOSIS — N289 Disorder of kidney and ureter, unspecified: Secondary | ICD-10-CM | POA: Diagnosis present

## 2017-10-26 DIAGNOSIS — E119 Type 2 diabetes mellitus without complications: Secondary | ICD-10-CM | POA: Diagnosis not present

## 2017-10-26 DIAGNOSIS — E1140 Type 2 diabetes mellitus with diabetic neuropathy, unspecified: Secondary | ICD-10-CM | POA: Diagnosis present

## 2017-10-26 NOTE — Progress Notes (Signed)
Patient is being seen today at the [facility] in [place]. I have asked that they fax records to [place] in regards to treatment and then once wound is healed so that we can restart treatment.

## 2017-10-29 ENCOUNTER — Encounter (HOSPITAL_COMMUNITY): Payer: Self-pay | Admitting: *Deleted

## 2017-10-29 NOTE — Progress Notes (Signed)
I called to check on patient today and he states that after his visit with the wound care physician on Friday, they admitted him for IV antibiotics.  Patient states that he will be discharged to SNF for further wound care.  I advised patient that I will continue to follow up. He verbalizes appreciation.

## 2017-10-30 DIAGNOSIS — M6281 Muscle weakness (generalized): Secondary | ICD-10-CM | POA: Diagnosis not present

## 2017-10-30 DIAGNOSIS — C189 Malignant neoplasm of colon, unspecified: Secondary | ICD-10-CM | POA: Diagnosis not present

## 2017-10-30 DIAGNOSIS — R1311 Dysphagia, oral phase: Secondary | ICD-10-CM | POA: Diagnosis not present

## 2017-10-30 DIAGNOSIS — I890 Lymphedema, not elsewhere classified: Secondary | ICD-10-CM | POA: Diagnosis not present

## 2017-10-30 DIAGNOSIS — L03315 Cellulitis of perineum: Secondary | ICD-10-CM | POA: Diagnosis not present

## 2017-10-30 DIAGNOSIS — Z23 Encounter for immunization: Secondary | ICD-10-CM | POA: Diagnosis not present

## 2017-10-30 DIAGNOSIS — I5032 Chronic diastolic (congestive) heart failure: Secondary | ICD-10-CM | POA: Diagnosis not present

## 2017-10-30 DIAGNOSIS — Z794 Long term (current) use of insulin: Secondary | ICD-10-CM | POA: Diagnosis not present

## 2017-10-30 DIAGNOSIS — I10 Essential (primary) hypertension: Secondary | ICD-10-CM | POA: Diagnosis not present

## 2017-10-30 DIAGNOSIS — R339 Retention of urine, unspecified: Secondary | ICD-10-CM | POA: Diagnosis not present

## 2017-10-30 DIAGNOSIS — R262 Difficulty in walking, not elsewhere classified: Secondary | ICD-10-CM | POA: Diagnosis not present

## 2017-10-30 DIAGNOSIS — F339 Major depressive disorder, recurrent, unspecified: Secondary | ICD-10-CM | POA: Diagnosis not present

## 2017-10-30 DIAGNOSIS — L03314 Cellulitis of groin: Secondary | ICD-10-CM | POA: Diagnosis not present

## 2017-10-30 DIAGNOSIS — L03115 Cellulitis of right lower limb: Secondary | ICD-10-CM | POA: Diagnosis not present

## 2017-10-30 DIAGNOSIS — B372 Candidiasis of skin and nail: Secondary | ICD-10-CM | POA: Diagnosis not present

## 2017-10-30 DIAGNOSIS — L89153 Pressure ulcer of sacral region, stage 3: Secondary | ICD-10-CM | POA: Diagnosis not present

## 2017-10-30 DIAGNOSIS — R278 Other lack of coordination: Secondary | ICD-10-CM | POA: Diagnosis not present

## 2017-10-30 DIAGNOSIS — N492 Inflammatory disorders of scrotum: Secondary | ICD-10-CM | POA: Diagnosis not present

## 2017-10-30 DIAGNOSIS — E1140 Type 2 diabetes mellitus with diabetic neuropathy, unspecified: Secondary | ICD-10-CM | POA: Diagnosis not present

## 2017-10-31 DIAGNOSIS — L89153 Pressure ulcer of sacral region, stage 3: Secondary | ICD-10-CM | POA: Diagnosis not present

## 2017-10-31 DIAGNOSIS — I10 Essential (primary) hypertension: Secondary | ICD-10-CM | POA: Diagnosis not present

## 2017-10-31 DIAGNOSIS — L03314 Cellulitis of groin: Secondary | ICD-10-CM | POA: Diagnosis not present

## 2017-10-31 DIAGNOSIS — C189 Malignant neoplasm of colon, unspecified: Secondary | ICD-10-CM | POA: Diagnosis not present

## 2017-11-02 ENCOUNTER — Encounter (HOSPITAL_COMMUNITY): Payer: Self-pay | Admitting: *Deleted

## 2017-11-02 NOTE — Progress Notes (Signed)
I called patient today to check on him and his wound healing.  He states that he is now in the nursing home and will be there for "some time".  He states that they have ordered him a pressure reducing bed and are treating his wounds aggressively.  He states that he is feeling better.  I asked patient to call us should he need anything.  Patient verbalizes understanding at this time.

## 2017-11-05 DIAGNOSIS — L03314 Cellulitis of groin: Secondary | ICD-10-CM | POA: Diagnosis not present

## 2017-11-05 DIAGNOSIS — C189 Malignant neoplasm of colon, unspecified: Secondary | ICD-10-CM | POA: Diagnosis not present

## 2017-11-12 ENCOUNTER — Encounter (HOSPITAL_COMMUNITY): Payer: Self-pay | Admitting: *Deleted

## 2017-11-12 NOTE — Progress Notes (Signed)
I called to get an update from patient and he is still in nursing home. He is currently getting physical therapy to try and get him up and walking. Patient reports that his wounds are healing but he still "has a raw  Tail and stomach".  I asked patient to keep [place] updated on his treatment.  He verbalizes understanding and expresses thanks for [place] calling him today.

## 2017-11-14 DIAGNOSIS — L03314 Cellulitis of groin: Secondary | ICD-10-CM | POA: Diagnosis not present

## 2017-11-16 ENCOUNTER — Other Ambulatory Visit (HOSPITAL_COMMUNITY): Payer: Medicare Other

## 2017-11-16 ENCOUNTER — Ambulatory Visit (HOSPITAL_COMMUNITY): Payer: Medicare Other

## 2017-11-16 ENCOUNTER — Ambulatory Visit (HOSPITAL_COMMUNITY): Payer: Medicare Other | Admitting: Internal Medicine

## 2017-11-22 DIAGNOSIS — C189 Malignant neoplasm of colon, unspecified: Secondary | ICD-10-CM | POA: Diagnosis not present

## 2017-11-22 DIAGNOSIS — L03314 Cellulitis of groin: Secondary | ICD-10-CM | POA: Diagnosis not present

## 2017-11-28 DIAGNOSIS — L03314 Cellulitis of groin: Secondary | ICD-10-CM | POA: Diagnosis not present

## 2017-11-28 DIAGNOSIS — C189 Malignant neoplasm of colon, unspecified: Secondary | ICD-10-CM | POA: Diagnosis not present

## 2017-11-28 DIAGNOSIS — I5032 Chronic diastolic (congestive) heart failure: Secondary | ICD-10-CM | POA: Diagnosis not present

## 2017-11-28 DIAGNOSIS — I10 Essential (primary) hypertension: Secondary | ICD-10-CM | POA: Diagnosis not present

## 2017-12-05 DIAGNOSIS — C189 Malignant neoplasm of colon, unspecified: Secondary | ICD-10-CM | POA: Diagnosis not present

## 2017-12-05 DIAGNOSIS — L03314 Cellulitis of groin: Secondary | ICD-10-CM | POA: Diagnosis not present

## 2017-12-05 DIAGNOSIS — I10 Essential (primary) hypertension: Secondary | ICD-10-CM | POA: Diagnosis not present

## 2017-12-05 DIAGNOSIS — I5032 Chronic diastolic (congestive) heart failure: Secondary | ICD-10-CM | POA: Diagnosis not present

## 2017-12-07 DIAGNOSIS — L304 Erythema intertrigo: Secondary | ICD-10-CM | POA: Diagnosis not present

## 2017-12-07 DIAGNOSIS — M4712 Other spondylosis with myelopathy, cervical region: Secondary | ICD-10-CM | POA: Diagnosis not present

## 2017-12-07 DIAGNOSIS — J439 Emphysema, unspecified: Secondary | ICD-10-CM | POA: Diagnosis not present

## 2017-12-07 DIAGNOSIS — C189 Malignant neoplasm of colon, unspecified: Secondary | ICD-10-CM | POA: Diagnosis not present

## 2017-12-07 DIAGNOSIS — E1142 Type 2 diabetes mellitus with diabetic polyneuropathy: Secondary | ICD-10-CM | POA: Diagnosis not present

## 2017-12-07 DIAGNOSIS — C7801 Secondary malignant neoplasm of right lung: Secondary | ICD-10-CM | POA: Diagnosis not present

## 2017-12-10 DIAGNOSIS — M4712 Other spondylosis with myelopathy, cervical region: Secondary | ICD-10-CM | POA: Diagnosis not present

## 2017-12-10 DIAGNOSIS — C7801 Secondary malignant neoplasm of right lung: Secondary | ICD-10-CM | POA: Diagnosis not present

## 2017-12-10 DIAGNOSIS — E1142 Type 2 diabetes mellitus with diabetic polyneuropathy: Secondary | ICD-10-CM | POA: Diagnosis not present

## 2017-12-10 DIAGNOSIS — C189 Malignant neoplasm of colon, unspecified: Secondary | ICD-10-CM | POA: Diagnosis not present

## 2017-12-10 DIAGNOSIS — J439 Emphysema, unspecified: Secondary | ICD-10-CM | POA: Diagnosis not present

## 2017-12-10 DIAGNOSIS — L304 Erythema intertrigo: Secondary | ICD-10-CM | POA: Diagnosis not present

## 2017-12-12 DIAGNOSIS — J439 Emphysema, unspecified: Secondary | ICD-10-CM | POA: Diagnosis not present

## 2017-12-12 DIAGNOSIS — M4712 Other spondylosis with myelopathy, cervical region: Secondary | ICD-10-CM | POA: Diagnosis not present

## 2017-12-12 DIAGNOSIS — L304 Erythema intertrigo: Secondary | ICD-10-CM | POA: Diagnosis not present

## 2017-12-12 DIAGNOSIS — C7801 Secondary malignant neoplasm of right lung: Secondary | ICD-10-CM | POA: Diagnosis not present

## 2017-12-12 DIAGNOSIS — E1142 Type 2 diabetes mellitus with diabetic polyneuropathy: Secondary | ICD-10-CM | POA: Diagnosis not present

## 2017-12-12 DIAGNOSIS — C189 Malignant neoplasm of colon, unspecified: Secondary | ICD-10-CM | POA: Diagnosis not present

## 2017-12-14 ENCOUNTER — Encounter (HOSPITAL_COMMUNITY): Payer: Self-pay | Admitting: Internal Medicine

## 2017-12-14 ENCOUNTER — Inpatient Hospital Stay (HOSPITAL_COMMUNITY): Payer: Medicare Other | Attending: Internal Medicine | Admitting: Internal Medicine

## 2017-12-14 VITALS — BP 105/44 | HR 105 | Temp 97.9°F | Resp 20

## 2017-12-14 DIAGNOSIS — Z7982 Long term (current) use of aspirin: Secondary | ICD-10-CM | POA: Diagnosis not present

## 2017-12-14 DIAGNOSIS — I10 Essential (primary) hypertension: Secondary | ICD-10-CM

## 2017-12-14 DIAGNOSIS — Z794 Long term (current) use of insulin: Secondary | ICD-10-CM

## 2017-12-14 DIAGNOSIS — M4712 Other spondylosis with myelopathy, cervical region: Secondary | ICD-10-CM | POA: Diagnosis not present

## 2017-12-14 DIAGNOSIS — E119 Type 2 diabetes mellitus without complications: Secondary | ICD-10-CM | POA: Insufficient documentation

## 2017-12-14 DIAGNOSIS — R918 Other nonspecific abnormal finding of lung field: Secondary | ICD-10-CM

## 2017-12-14 DIAGNOSIS — Z923 Personal history of irradiation: Secondary | ICD-10-CM | POA: Insufficient documentation

## 2017-12-14 DIAGNOSIS — Z9221 Personal history of antineoplastic chemotherapy: Secondary | ICD-10-CM

## 2017-12-14 DIAGNOSIS — C189 Malignant neoplasm of colon, unspecified: Secondary | ICD-10-CM | POA: Diagnosis not present

## 2017-12-14 DIAGNOSIS — E1142 Type 2 diabetes mellitus with diabetic polyneuropathy: Secondary | ICD-10-CM | POA: Diagnosis not present

## 2017-12-14 DIAGNOSIS — Z79899 Other long term (current) drug therapy: Secondary | ICD-10-CM | POA: Insufficient documentation

## 2017-12-14 DIAGNOSIS — L304 Erythema intertrigo: Secondary | ICD-10-CM | POA: Diagnosis not present

## 2017-12-14 DIAGNOSIS — C7801 Secondary malignant neoplasm of right lung: Secondary | ICD-10-CM

## 2017-12-14 DIAGNOSIS — J439 Emphysema, unspecified: Secondary | ICD-10-CM | POA: Diagnosis not present

## 2017-12-14 DIAGNOSIS — Z86718 Personal history of other venous thrombosis and embolism: Secondary | ICD-10-CM | POA: Diagnosis not present

## 2017-12-14 DIAGNOSIS — Z87891 Personal history of nicotine dependence: Secondary | ICD-10-CM | POA: Insufficient documentation

## 2017-12-14 DIAGNOSIS — Z9049 Acquired absence of other specified parts of digestive tract: Secondary | ICD-10-CM | POA: Diagnosis not present

## 2017-12-14 NOTE — Progress Notes (Signed)
Diagnosis Adenocarcinoma of colon ([place]) - Plan: CBC with Differential/Platelet, Comprehensive metabolic panel, Lactate dehydrogenase, Ferritin, CEA  Abnormal findings on diagnostic imaging of lung - Plan: NM PET Image Restag (PS) Skull Base To Thigh  Staging Cancer Staging Adenocarcinoma of colon [facility]) Staging form: Colon and Rectum, AJCC 7th Edition - Clinical stage from 10/04/2012: Stage IIA (T3, N0, M0) - Signed by Baird Cancer, PA-C on 07/15/2015 - Pathologic stage from 09/17/2015: Stage IVA (T3, N0, M1a) - Signed by Baird Cancer, PA-C on 10/05/2015   Assessment and Plan:  1.  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 [name] followed by 6 months worth of adjuvant chemotherapy consisting of FOLFOX with a change in treatment to infusional 5-FU for progressive peripheral neuropathy to finish 6 months worth of treatment by Dr. [name].  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 [name].  Pt was last treated with SBRT of right lung from 01/02/2017 to 01/12/2017.    CAP done 05/29/2017 showed  IMPRESSION: 1. Area of presumed post radiation change in the medial posterior right lower lobe measures minimally larger today although there is some adjacent atelectasis on today's exam and a new tiny right pleural effusion. Continued close attention on follow-up recommended. 2. No new or progressive findings in the abdomen/pelvis to suggest metastatic disease. 3. 11 mm hyperattenuating posterior right liver lesion unchanged. 4. Previously identified right omental nodule is stable. 5.  Aortic Atherosclerois (ICD10-170.0) 6.  Emphysema. (POE42-P53.9)  PET scan that was done 08/27/2017  showed  IMPRESSION: 1. Focal  hypermetabolism (max SUV 5.3) within masslike focus of consolidation in the medial basilar right lower lobe, suspicious for residual/recurrent tumor. 2. No additional hypermetabolic sites of metastatic disease. 3. New tiny 3 mm solid anterior right upper lobe pulmonary nodule, not definitely seen on prior chest CT, below PET resolution. Recommend attention on follow-up chest CT in 3 months. 4. Trace dependent right pleural effusion. 5.  Aortic Atherosclerosis (ICD10-I70.0).  I discussed with him that due to known evidence of metastatic involvement of the lung from colorectal cancer he is recommended for systemic therapy.  Pt was treated with Xeloda dosed at 1000 mg/m twice a day for 14 days with 1 week off.  Based on results of the Avex  trial, I have spoken with him regarding addition of Avastin dosed at 7.5 mg/kg every 21 days with Xeloda.  He initially tolerated Xeloda and denied significant diarrhea.    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  He has negative NRAS, KRAS and HRAS.    Pt has been in nursing facility due to extensive rash on bottom area.  He is now being followed by home health.  I have discussed with him treatment will remain on hold for now and he will be set up for repeat imaging with PET scan in 01/2018 for ongoing follow-up of lung lesions.  He will be seen for follow-up at that time to go over results.    2.  Right lung lesion.  Pt had CT of chest done 05/29/2017 that showed Medial right lower lobe pleural-based density is similar to prior measuring 3.5 cm long axis today compared to 2.8 previously. No new pulmonary nodule or mass. Tiny right pleural effusion is new in the interval.  PET scan  that was done 08/27/2017 showed  IMPRESSION: 1. Focal hypermetabolism (max SUV 5.3) within masslike focus of consolidation in the medial basilar right lower lobe, suspicious for residual/recurrent tumor. 2. No additional hypermetabolic sites of metastatic disease. 3. New  tiny 3 mm solid anterior right upper lobe pulmonary nodule, not definitely seen on prior chest CT, below PET resolution. Recommend attention on follow-up chest CT in 3 months  in 08/2017 for short interval evaluation.    Case was reviewed at tumor conference and pt is not a candidate for additional RT.  He is recommended for systemic therapy.  Pt was started on Xeloda and Avastin which is now on hold due to skin breakdown.    3.  Skin breakdown.  Pt is being followed by home health.  Will ask for updated reports concerning pt progress.    4.  PAC.  Continue port flushes as directed.    5.  HTN.  BP is 105/44.   Follow-up with PCP.    25 minutes spent with more than 50% spent in counseling and coordination of care.    Interval  history: Historical data obtained from the note dated 06/05/2017.  77 y.o. 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 [name] followed by 6 months worth of adjuvant chemotherapy consisting of FOLFOX with a change in treatment to infusional 5-FU for progressive peripheral neuropathy to finish 6 months worth of treatment by Dr. [name].  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 [name].  Current Status:  Pt is seen today for follow-up.  He is being followed by home health regarding skin breakdown.  He is accompanied by family member    Adenocarcinoma of colon ([place])   10/04/2012 Definitive Surgery    Partial collectomy by Dr. [name]    10/04/2012 Pathology Results    4.5 cm poorly differentiated adenocarcinoma, negative margins, 0/7 lymph nodes for metastatic disease.  Oncotype recurrence score of 26 (high)     Chemotherapy    FOLFOX     Adverse Reaction    Progressive peripheral  neuropathy     Chemotherapy    Infusional 5 FU.  Completed 6 months worth of adjuvant therapy.    02/23/2014 Procedure    Colonoscopy by Dr. Anthony Sar.    07/01/2015 PET scan    There is malignant range uptake with RLL pulmonary nodule suspicious for metastatic disease or primary pulm neoplasm. Nonspecific focus of uptake in the L femoral neck. No corresponding CT abnormality. Cannot rule out metastasis.    07/06/2015 Imaging    MR C-spine- C5-6 there is a broad-based disc bulge w R paracentral disc protrusion deforming the spinal cord. Moderate R foraminal stenosis. C6-7 there is a central disc protrusion slightly eccentric towards the R and mildly deforming the ventral c-spine     09/16/2015 Procedure    RLL needle biopsy by IR.    09/17/2015 Pathology Results    Metastatic adenocarcinoma consistent with a primary colorectal adenocarcinoma.    10/12/2015 - 10/22/2015 Radiation Therapy    SBRT by Dr. [name] to RLL pulmonary lesion (biopsy proven to be oligometastasis).    12/09/2015 Imaging    CT chest- 1. Slight interval decrease in size of the right lower lobe pulmonary nodule. Some surrounding radiation changes. 2. No mediastinal  or hilar mass or adenopathy. 3. No new pulmonary nodules. 4. Stable underline emphysematous changes. 5. Cirrhotic changes involving the liver but no upper abdominal metastatic disease.    03/14/2016 Imaging    CT chest- 1. Continued mild reduction in the size of the treated right lobe pulmonary nodule. 2. Increased patchy consolidation, reticulation and ground-glass attenuation in the basilar right lower lobe, nonspecific, favor evolving postradiation change, recommend attention on follow-up chest CT. 3. No evidence of new or progressive metastatic disease in the chest. 4. Aortic atherosclerosis. Left main and 3 vessel coronary atherosclerosis. 5. Mild emphysema.    06/30/2016 Imaging    CT CAP- 1. The appearance of the treated right lower lobe nodule  is essentially unchanged, and there are evolving postradiation changes in the base of the right lower lobe, as above. No definite signs of new metastatic disease are noted elsewhere in the chest, abdomen or pelvis. 2. Aortic atherosclerosis, in addition to left main and 3 vessel coronary artery disease. Assessment for potential risk factor modification, dietary therapy or pharmacologic therapy may be warranted, if clinically indicated. 3. Morphologic changes in the liver suggestive of cirrhosis, as above. 4. Additional incidental findings, similar prior studies, as above.    11/17/2016 Imaging    CT CAP Study Result   CLINICAL DATA:  Stage IV colon cancer originally diagnosed in August 2014 status post partial colectomy, with right lower lobe lung metastasis diagnosed August 2017 status post SBRT completed 10/22/2015. Restaging.  EXAM: CT CHEST, ABDOMEN, AND PELVIS WITH CONTRAST  TECHNIQUE: Multidetector CT imaging of the chest, abdomen and pelvis was performed following the standard protocol during bolus administration of intravenous contrast.  CONTRAST:  134m ISOVUE-300 IOPAMIDOL (ISOVUE-300) INJECTION 61%  COMPARISON:  06/30/2016 CT chest, abdomen and pelvis.  FINDINGS: CT CHEST FINDINGS  Cardiovascular: Normal heart size. No significant pericardial fluid/thickening. Left main, left anterior descending, left circumflex and right coronary atherosclerosis. Atherosclerotic nonaneurysmal thoracic aorta. Normal caliber pulmonary arteries. No central pulmonary emboli. Right internal jugular MediPort terminates at the junction of the right and left brachiocephalic veins.  Mediastinum/Nodes: No discrete thyroid nodules. Unremarkable esophagus. No pathologically enlarged axillary, mediastinal or hilar lymph nodes.  Lungs/Pleura: No pneumothorax. No pleural effusion. There is a nodular 2.7 x 1.6 cm focus of consolidation in the medial basilar right lower lobe  (series 3/ image 108), previously 1.6 x 1.1 cm. No appreciable change in patchy subpleural reticulation in both lungs without significant traction bronchiectasis or frank honeycombing. Mild centrilobular and paraseptal emphysema with mild diffuse bronchial wall thickening. No acute consolidative airspace disease or new significant pulmonary nodules.  Musculoskeletal: No aggressive appearing focal osseous lesions. Moderate thoracic spondylosis. Stable chronic left scapular deformity.  CT ABDOMEN PELVIS FINDINGS  Hepatobiliary: Normal liver with no liver mass. Cholecystectomy. No biliary ductal dilatation.  Pancreas: Normal, with no mass or duct dilation.  Spleen: Normal size. No mass.  Adrenals/Urinary Tract: Normal adrenals. No hydronephrosis. No renal masses. Normal bladder.  Stomach/Bowel: Grossly normal stomach. Normal caliber small bowel with no small bowel wall thickening. Stable appearance of the appendix, within normal limits. Stable postsurgical changes from partial distal colectomy with intact appearing distal colonic anastomosis. Oral contrast transits to the cecum. No large bowel wall thickening or new pericholecystic fat stranding .  Vascular/Lymphatic: Atherosclerotic nonaneurysmal abdominal aorta. Patent portal, splenic, hepatic and renal veins. No pathologically enlarged lymph nodes in the abdomen or pelvis.  Reproductive:  Normal size prostate.  Other: No pneumoperitoneum, ascites or focal fluid collection. Stable subcutaneous calcified  granulomas throughout the left lower back and gluteal regions.  Musculoskeletal: No aggressive appearing focal osseous lesions. Marked lumbar spondylosis.  IMPRESSION: 1. Nodular focus of consolidation in the medial basilar right lower lobe is increased in size. While this may represent evolving masslike radiation fibrosis, recurrence of the lung metastasis is not excluded. PET-CT would be useful for further  evaluation. Otherwise, continued close chest CT surveillance is advised . 2. No additional potential new sites of metastatic disease in the chest, abdomen or pelvis. 3. No evidence of local tumor recurrence at the distal colonic anastomosis. 4. Left main and 3 vessel coronary atherosclerosis . 5. Aortic Atherosclerosis (ICD10-I70.0) and Emphysema (ICD10-J43.9).       11/30/2016 Relapse/Recurrence    PET: IMPRESSION: 1. Enlarging right lower lobe pulmonary nodule and slight increase in the SUV max suggesting residual neoplasm. No new pulmonary lesions. 2. No findings for local recurrence or abdominal/pelvic metastatic disease.    01/02/2017 - 01/12/2017 Radiation Therapy     The patient saw Dr. [name] for SBRT radiation treatment. The tumor in the right lung was treated with a course of stereotactic body radiation treatment. The patient received 50 Gy In 5 fractions at 10 Gy per fraction.       10/05/2017 -  Chemotherapy    The patient had bevacizumab (AVASTIN) 900 mg in sodium chloride 0.9 % 100 mL chemo infusion, 7.3 mg/kg = 925 mg, Intravenous,  Once, 1 of 3 cycles Administration: 900 mg (10/05/2017)  for chemotherapy treatment.       Problem List Patient Active Problem List   Diagnosis Date Noted  . Lung metastasis ([place]) [C78.00] 10/20/2015  . 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 Adenocarcinoma of colon ([place]) [C18.9] 07/15/2015  . Sepsis ([place]) [A41.9] 03/19/2013  . CHF, chronic ([place]) [I50.9] 03/19/2013  . Diarrhea [R19.7] 02/19/2013  . Dvt femoral (deep venous thrombosis) ([place]) [I82.419] 02/19/2013  . HTN (hypertension) [I10] 09/09/2012  . Diabetes mellitus ([place]) [E11.9] 09/09/2012  . Morbid obesity ([place]) [E66.01] 09/09/2012  . Sleep apnea [G47.30] 09/09/2012  . CKD (chronic kidney disease) [N18.9] 09/09/2012    Past Medical History Past Medical History:  Diagnosis Date  . Adenocarcinoma of colon ([place])    RLL -  Pulmonary nodule (06/2015,  concerning for mets vs primary; to see RAD-ONC Dr. [name]); stage 2 adenocarinoma   2014 - Colon Cancer - surgery and chemo  . Blood infection ([place])   . Cystitis   . Diabetes mellitus without complication ([place])    Type II  . DVT (deep venous thrombosis) ([place]) 2014   post op  . Hematuria   . Hypertension   . Neuropathy   . Prostatitis   . Pulmonary nodule   . Sleep apnea     Past Surgical History Past Surgical History:  Procedure Laterality Date  . BACK SURGERY    . CATARACT EXTRACTION     left  . CATARACT EXTRACTION W/PHACO Right 04/11/2012   Procedure: CATARACT EXTRACTION PHACO AND INTRAOCULAR LENS PLACEMENT (IOC);  Surgeon: Tonny Branch, MD;  Location: [facility] ORS;  Service: Ophthalmology;  Laterality: Right;  CDE:62.48  . CERVICAL FUSION     c4-c5  . CHOLECYSTECTOMY    . COLON SURGERY  09/29/12   Colon resection for cancer  . COLONOSCOPY    . EYE SURGERY     cataract  . GIVENS CAPSULE STUDY N/A 03/15/2015   Procedure: GIVENS CAPSULE STUDY;  Surgeon: Rogene Houston, MD;  Location: [facility] ENDO SUITE;  Service: Endoscopy;  Laterality: N/A;  730  . LEG SURGERY    . ORIF FEMUR FRACTURE     right  . POSTERIOR CERVICAL FUSION/FORAMINOTOMY N/A 07/27/2015   Procedure: Cervical four to Cervical seven Laminectomy with Cervical four to Thoracic one Dorsal internal fixation and fusion;  Surgeon: Kevan Ny Ditty, MD;  Location: [place] [facility] OR[facility];  Service: Neurosurgery;  Laterality: N/A;  C4 to C7 Laminectomy with C4 to T1 Dorsal internal fixation and fusion    Family History Family History  Problem Relation Age of Onset  . Heart disease Father   . Heart attack Father   . Alzheimer's disease Mother      Social History  reports that he has quit smoking. His smoking use included cigarettes. He started smoking about 62 years ago. He has a 55.00 pack-year smoking history. He has never used smokeless tobacco. He reports that he drank about 3.0 standard drinks of alcohol per week. He reports that he  does not use drugs.  Medications  Current Outpatient Medications:  .  aspirin EC 81 MG tablet, Take 81 mg by mouth daily., Disp: , Rfl:  .  atorvastatin (LIPITOR) 10 MG tablet, Take 10 mg by mouth daily at 6 PM. , Disp: , Rfl:  .  furosemide (LASIX) 20 MG tablet, Take 20 mg by mouth every other day. , Disp: , Rfl:  .  gabapentin (NEURONTIN) 300 MG capsule, Take 2 capsules (600 mg total) by mouth 3 (three) times daily. (Patient taking differently: Take 600 mg by mouth 2 (two) times daily. ), Disp: 180 capsule, Rfl: 2 .  HYDROcodone-acetaminophen (NORCO/VICODIN) 5-325 MG tablet, , Disp: , Rfl:  .  insulin NPH (HUMULIN N,NOVOLIN N) 100 UNIT/ML injection, Inject 15-30 Units into the skin See admin instructions. 25 units every morning and 14 units in the evening, Disp: , Rfl:  .  lisinopril (PRINIVIL,ZESTRIL) 40 MG tablet, Take 40 mg by mouth daily., Disp: , Rfl:  .  naproxen (NAPROSYN) 500 MG tablet, TAKE 1 TABLET BY MOUTH TWICE A DAY WITH MEALS, Disp: , Rfl:  .  nystatin-triamcinolone (MYCOLOG II) cream, Apply topically., Disp: , Rfl:  .  OXYGEN, Inhale 2 L into the lungs continuous. At night time only, Disp: , Rfl:  .  silver sulfADIAZINE (SILVADENE) 1 % cream, , Disp: , Rfl:   Allergies Patient has no known allergies.  Review of Systems Review of Systems - Oncology ROS negative other than skin breakdown improving. Mouth sores improved.    Physical Exam  Vitals Wt Readings from Last 3 Encounters:  10/19/17 221 lb (100.2 kg)  10/05/17 272 lb (123.4 kg)  10/02/17 269 lb 12.8 oz (122.4 kg)   Temp Readings from Last 3 Encounters:  12/14/17 97.9 F (36.6 C) (Oral)  10/19/17 97.6 F (36.4 C) (Oral)  10/05/17 97.9 F (36.6 C) (Oral)   BP Readings from Last 3 Encounters:  12/14/17 (!) 105/44  10/19/17 (!) 102/37  10/05/17 (!) 151/59   Pulse Readings from Last 3 Encounters:  12/14/17 (!) 105  10/19/17 94  10/05/17 100    Constitutional: Well-developed, well-nourished, and in  no distress.  Pt seated in wheelchair HENT: Head: Normocephalic and atraumatic.  Mouth/Throat: No oropharyngeal exudate. Mucosa moist. Eyes: Pupils are equal, round, and reactive to light. Conjunctivae are normal. No scleral icterus.  Neck: Normal range of motion. Neck supple. No JVD present.  Cardiovascular: Normal rate, regular rhythm and normal heart sounds.  Exam reveals no gallop and no friction rub.   No murmur  heard. Pulmonary/Chest: Effort normal and breath sounds normal. No respiratory distress. No wheezes.No rales.  Abdominal: Soft. Bowel sounds are normal. No distension. There is no tenderness. There is no guarding.  Musculoskeletal: No edema or tenderness.  Lymphadenopathy: No cervical, axillary or supraclavicular adenopathy.  Neurological: Alert and oriented to person, place, and time. No cranial nerve deficit.  Skin: Skin is warm and dry. No rash noted. No erythema. No pallor.  Psychiatric: Affect and judgment normal.   Labs No visits with results within 3 Day(s) from this visit.  Latest known visit with results is:  Admission on 10/19/2017, Discharged on 10/19/2017  Component Date Value Ref Range Status  . Lactic Acid, Venous 10/19/2017 3.27* 0.5 - 1.9 mmol/L Final  . Comment 10/19/2017 NOTIFIED PHYSICIAN   Final  . WBC 10/19/2017 6.7  4.0 - 10.5 K/uL Final  . RBC 10/19/2017 3.41* 4.22 - 5.81 MIL/uL Final  . Hemoglobin 10/19/2017 10.8* 13.0 - 17.0 g/dL Final  . HCT 10/19/2017 31.4* 39.0 - 52.0 % Final  . MCV 10/19/2017 92.1  78.0 - 100.0 fL Final  . MCH 10/19/2017 31.7  26.0 - 34.0 pg Final  . MCHC 10/19/2017 34.4  30.0 - 36.0 g/dL Final  . RDW 10/19/2017 14.8  11.5 - 15.5 % Final  . Platelets 10/19/2017 230  150 - 400 K/uL Final  . Neutrophils Relative % 10/19/2017 87  % Final  . Neutro Abs 10/19/2017 5.8  1.7 - 7.7 K/uL Final  . Lymphocytes Relative 10/19/2017 7  % Final  . Lymphs Abs 10/19/2017 0.5* 0.7 - 4.0 K/uL Final  . Monocytes Relative 10/19/2017 5  % Final   . Monocytes Absolute 10/19/2017 0.4  0.1 - 1.0 K/uL Final  . Eosinophils Relative 10/19/2017 1  % Final  . Eosinophils Absolute 10/19/2017 0.0  0.0 - 0.7 K/uL Final  . Basophils Relative 10/19/2017 0  % Final  . Basophils Absolute 10/19/2017 0.0  0.0 - 0.1 K/uL Final   Performed at [name], [address], [place], [place] [address]  . Sodium 10/19/2017 133* 135 - 145 mmol/L Final  . Potassium 10/19/2017 4.2  3.5 - 5.1 mmol/L Final  . Chloride 10/19/2017 105  98 - 111 mmol/L Final  . CO2 10/19/2017 20* 22 - 32 mmol/L Final  . Glucose, Bld 10/19/2017 248* 70 - 99 mg/dL Final  . BUN 10/19/2017 26* 8 - 23 mg/dL Final  . Creatinine, Ser 10/19/2017 1.38* 0.61 - 1.24 mg/dL Final  . Calcium 10/19/2017 7.2* 8.9 - 10.3 mg/dL Final  . Total Protein 10/19/2017 5.5* 6.5 - 8.1 g/dL Final  . Albumin 10/19/2017 2.8* 3.5 - 5.0 g/dL Final  . AST 10/19/2017 19  15 - 41 U/L Final  . ALT 10/19/2017 13  0 - 44 U/L Final  . Alkaline Phosphatase 10/19/2017 65  38 - 126 U/L Final  . Total Bilirubin 10/19/2017 1.2  0.3 - 1.2 mg/dL Final  . GFR calc non Af Amer 10/19/2017 48* >60 mL/min Final  . GFR calc Af Amer 10/19/2017 56* >60 mL/min Final   Comment: (NOTE) The eGFR has been calculated using the CKD EPI equation. This calculation has not been validated in all clinical situations. eGFR's persistently <60 mL/min signify possible Chronic Kidney Disease.   Georgiann Hahn gap 10/19/2017 8  5 - 15 Final   Performed at [facility], [address] [place], [place] [address]     Pathology Orders Placed This Encounter  Procedures  . NM PET Image Restag (PS) Skull  Base To Thigh    Standing Status:   Future    Standing Expiration Date:   12/14/2018    Order Specific Question:   If indicated for the ordered procedure, I authorize the administration of a radiopharmaceutical per Radiology protocol    Answer:   Yes    Order Specific Question:   Preferred imaging location?    Answer:   [facility]    Order  Specific Question:   Radiology Contrast Protocol - do NOT remove file path    Answer:   [email or web address]  . CBC with Differential/Platelet    Standing Status:   Future    Standing Expiration Date:   12/15/2018  . Comprehensive metabolic panel    Standing Status:   Future    Standing Expiration Date:   12/15/2018  . Lactate dehydrogenase    Standing Status:   Future    Standing Expiration Date:   12/15/2018  . Ferritin    Standing Status:   Future    Standing Expiration Date:   12/15/2018  . CEA    Standing Status:   Future    Standing Expiration Date:   12/15/2018       Zoila Shutter MD

## 2017-12-17 DIAGNOSIS — E1142 Type 2 diabetes mellitus with diabetic polyneuropathy: Secondary | ICD-10-CM | POA: Diagnosis not present

## 2017-12-17 DIAGNOSIS — M4712 Other spondylosis with myelopathy, cervical region: Secondary | ICD-10-CM | POA: Diagnosis not present

## 2017-12-17 DIAGNOSIS — I700 Atherosclerosis of aorta: Secondary | ICD-10-CM | POA: Diagnosis not present

## 2017-12-17 DIAGNOSIS — Z87891 Personal history of nicotine dependence: Secondary | ICD-10-CM | POA: Diagnosis not present

## 2017-12-17 DIAGNOSIS — G4730 Sleep apnea, unspecified: Secondary | ICD-10-CM | POA: Diagnosis not present

## 2017-12-17 DIAGNOSIS — I509 Heart failure, unspecified: Secondary | ICD-10-CM | POA: Diagnosis not present

## 2017-12-17 DIAGNOSIS — Z86718 Personal history of other venous thrombosis and embolism: Secondary | ICD-10-CM | POA: Diagnosis not present

## 2017-12-17 DIAGNOSIS — Z4800 Encounter for change or removal of nonsurgical wound dressing: Secondary | ICD-10-CM | POA: Diagnosis not present

## 2017-12-17 DIAGNOSIS — Z6841 Body Mass Index (BMI) 40.0 and over, adult: Secondary | ICD-10-CM | POA: Diagnosis not present

## 2017-12-17 DIAGNOSIS — Z7982 Long term (current) use of aspirin: Secondary | ICD-10-CM | POA: Diagnosis not present

## 2017-12-17 DIAGNOSIS — I130 Hypertensive heart and chronic kidney disease with heart failure and stage 1 through stage 4 chronic kidney disease, or unspecified chronic kidney disease: Secondary | ICD-10-CM | POA: Diagnosis not present

## 2017-12-17 DIAGNOSIS — E1122 Type 2 diabetes mellitus with diabetic chronic kidney disease: Secondary | ICD-10-CM | POA: Diagnosis not present

## 2017-12-17 DIAGNOSIS — Z9181 History of falling: Secondary | ICD-10-CM | POA: Diagnosis not present

## 2017-12-17 DIAGNOSIS — C189 Malignant neoplasm of colon, unspecified: Secondary | ICD-10-CM | POA: Diagnosis not present

## 2017-12-17 DIAGNOSIS — I2510 Atherosclerotic heart disease of native coronary artery without angina pectoris: Secondary | ICD-10-CM | POA: Diagnosis not present

## 2017-12-17 DIAGNOSIS — N189 Chronic kidney disease, unspecified: Secondary | ICD-10-CM | POA: Diagnosis not present

## 2017-12-17 DIAGNOSIS — B372 Candidiasis of skin and nail: Secondary | ICD-10-CM | POA: Diagnosis not present

## 2017-12-17 DIAGNOSIS — Z794 Long term (current) use of insulin: Secondary | ICD-10-CM | POA: Diagnosis not present

## 2017-12-17 DIAGNOSIS — J439 Emphysema, unspecified: Secondary | ICD-10-CM | POA: Diagnosis not present

## 2017-12-17 DIAGNOSIS — L304 Erythema intertrigo: Secondary | ICD-10-CM | POA: Diagnosis not present

## 2017-12-17 DIAGNOSIS — C7801 Secondary malignant neoplasm of right lung: Secondary | ICD-10-CM | POA: Diagnosis not present

## 2017-12-18 DIAGNOSIS — M4712 Other spondylosis with myelopathy, cervical region: Secondary | ICD-10-CM | POA: Diagnosis not present

## 2017-12-18 DIAGNOSIS — J439 Emphysema, unspecified: Secondary | ICD-10-CM | POA: Diagnosis not present

## 2017-12-18 DIAGNOSIS — L304 Erythema intertrigo: Secondary | ICD-10-CM | POA: Diagnosis not present

## 2017-12-18 DIAGNOSIS — C189 Malignant neoplasm of colon, unspecified: Secondary | ICD-10-CM | POA: Diagnosis not present

## 2017-12-18 DIAGNOSIS — C7801 Secondary malignant neoplasm of right lung: Secondary | ICD-10-CM | POA: Diagnosis not present

## 2017-12-18 DIAGNOSIS — E1142 Type 2 diabetes mellitus with diabetic polyneuropathy: Secondary | ICD-10-CM | POA: Diagnosis not present

## 2017-12-19 ENCOUNTER — Encounter (HOSPITAL_COMMUNITY): Payer: Self-pay | Admitting: *Deleted

## 2017-12-19 NOTE — Progress Notes (Signed)
I spoke with Delight Ovens, NP patient's PCP and asked that she give [place] weekly updates on patient and his wound healing, per Dr. Walden Field. She states that she will forward all information that she receives to both myself and Dr. Walden Field.

## 2017-12-20 DIAGNOSIS — C189 Malignant neoplasm of colon, unspecified: Secondary | ICD-10-CM | POA: Diagnosis not present

## 2017-12-20 DIAGNOSIS — M4712 Other spondylosis with myelopathy, cervical region: Secondary | ICD-10-CM | POA: Diagnosis not present

## 2017-12-20 DIAGNOSIS — L304 Erythema intertrigo: Secondary | ICD-10-CM | POA: Diagnosis not present

## 2017-12-20 DIAGNOSIS — J439 Emphysema, unspecified: Secondary | ICD-10-CM | POA: Diagnosis not present

## 2017-12-20 DIAGNOSIS — C7801 Secondary malignant neoplasm of right lung: Secondary | ICD-10-CM | POA: Diagnosis not present

## 2017-12-20 DIAGNOSIS — E1142 Type 2 diabetes mellitus with diabetic polyneuropathy: Secondary | ICD-10-CM | POA: Diagnosis not present

## 2017-12-23 DIAGNOSIS — Z9181 History of falling: Secondary | ICD-10-CM | POA: Insufficient documentation

## 2017-12-24 DIAGNOSIS — M4712 Other spondylosis with myelopathy, cervical region: Secondary | ICD-10-CM | POA: Diagnosis not present

## 2017-12-24 DIAGNOSIS — C189 Malignant neoplasm of colon, unspecified: Secondary | ICD-10-CM | POA: Diagnosis not present

## 2017-12-24 DIAGNOSIS — C7801 Secondary malignant neoplasm of right lung: Secondary | ICD-10-CM | POA: Diagnosis not present

## 2017-12-24 DIAGNOSIS — J439 Emphysema, unspecified: Secondary | ICD-10-CM | POA: Diagnosis not present

## 2017-12-24 DIAGNOSIS — L304 Erythema intertrigo: Secondary | ICD-10-CM | POA: Diagnosis not present

## 2017-12-24 DIAGNOSIS — E1142 Type 2 diabetes mellitus with diabetic polyneuropathy: Secondary | ICD-10-CM | POA: Diagnosis not present

## 2017-12-26 DIAGNOSIS — J439 Emphysema, unspecified: Secondary | ICD-10-CM | POA: Diagnosis not present

## 2017-12-26 DIAGNOSIS — Z794 Long term (current) use of insulin: Secondary | ICD-10-CM | POA: Diagnosis not present

## 2017-12-26 DIAGNOSIS — L304 Erythema intertrigo: Secondary | ICD-10-CM | POA: Diagnosis not present

## 2017-12-26 DIAGNOSIS — E119 Type 2 diabetes mellitus without complications: Secondary | ICD-10-CM | POA: Diagnosis not present

## 2017-12-26 DIAGNOSIS — E1142 Type 2 diabetes mellitus with diabetic polyneuropathy: Secondary | ICD-10-CM | POA: Diagnosis not present

## 2017-12-26 DIAGNOSIS — C189 Malignant neoplasm of colon, unspecified: Secondary | ICD-10-CM | POA: Diagnosis not present

## 2017-12-26 DIAGNOSIS — C7801 Secondary malignant neoplasm of right lung: Secondary | ICD-10-CM | POA: Diagnosis not present

## 2017-12-26 DIAGNOSIS — M4712 Other spondylosis with myelopathy, cervical region: Secondary | ICD-10-CM | POA: Diagnosis not present

## 2017-12-27 DIAGNOSIS — C189 Malignant neoplasm of colon, unspecified: Secondary | ICD-10-CM | POA: Diagnosis not present

## 2017-12-27 DIAGNOSIS — L304 Erythema intertrigo: Secondary | ICD-10-CM | POA: Diagnosis not present

## 2017-12-27 DIAGNOSIS — J439 Emphysema, unspecified: Secondary | ICD-10-CM | POA: Diagnosis not present

## 2017-12-27 DIAGNOSIS — C7801 Secondary malignant neoplasm of right lung: Secondary | ICD-10-CM | POA: Diagnosis not present

## 2017-12-27 DIAGNOSIS — M4712 Other spondylosis with myelopathy, cervical region: Secondary | ICD-10-CM | POA: Diagnosis not present

## 2017-12-27 DIAGNOSIS — E1142 Type 2 diabetes mellitus with diabetic polyneuropathy: Secondary | ICD-10-CM | POA: Diagnosis not present

## 2017-12-28 DIAGNOSIS — E1142 Type 2 diabetes mellitus with diabetic polyneuropathy: Secondary | ICD-10-CM | POA: Diagnosis not present

## 2017-12-28 DIAGNOSIS — L304 Erythema intertrigo: Secondary | ICD-10-CM | POA: Diagnosis not present

## 2017-12-28 DIAGNOSIS — C189 Malignant neoplasm of colon, unspecified: Secondary | ICD-10-CM | POA: Diagnosis not present

## 2017-12-28 DIAGNOSIS — C7801 Secondary malignant neoplasm of right lung: Secondary | ICD-10-CM | POA: Diagnosis not present

## 2017-12-28 DIAGNOSIS — J439 Emphysema, unspecified: Secondary | ICD-10-CM | POA: Diagnosis not present

## 2017-12-28 DIAGNOSIS — M4712 Other spondylosis with myelopathy, cervical region: Secondary | ICD-10-CM | POA: Diagnosis not present

## 2017-12-31 ENCOUNTER — Encounter (HOSPITAL_COMMUNITY): Payer: Self-pay | Admitting: *Deleted

## 2017-12-31 DIAGNOSIS — J439 Emphysema, unspecified: Secondary | ICD-10-CM | POA: Diagnosis not present

## 2017-12-31 DIAGNOSIS — C189 Malignant neoplasm of colon, unspecified: Secondary | ICD-10-CM | POA: Diagnosis not present

## 2017-12-31 DIAGNOSIS — E1142 Type 2 diabetes mellitus with diabetic polyneuropathy: Secondary | ICD-10-CM | POA: Diagnosis not present

## 2017-12-31 DIAGNOSIS — L304 Erythema intertrigo: Secondary | ICD-10-CM | POA: Diagnosis not present

## 2017-12-31 DIAGNOSIS — M4712 Other spondylosis with myelopathy, cervical region: Secondary | ICD-10-CM | POA: Diagnosis not present

## 2017-12-31 DIAGNOSIS — C7801 Secondary malignant neoplasm of right lung: Secondary | ICD-10-CM | POA: Diagnosis not present

## 2017-12-31 NOTE — Progress Notes (Signed)
I spoke with Ellender Hose, RN  at [facility] today and she states that she will forward me a copy of the most recent pictures taken of Mr. Memoli wounds and her last progress note.  I asked again that she forward weekly updates to [place] and she verbalizes intent to do so.

## 2018-01-01 DIAGNOSIS — J439 Emphysema, unspecified: Secondary | ICD-10-CM | POA: Diagnosis not present

## 2018-01-01 DIAGNOSIS — L304 Erythema intertrigo: Secondary | ICD-10-CM | POA: Diagnosis not present

## 2018-01-01 DIAGNOSIS — C7801 Secondary malignant neoplasm of right lung: Secondary | ICD-10-CM | POA: Diagnosis not present

## 2018-01-01 DIAGNOSIS — C189 Malignant neoplasm of colon, unspecified: Secondary | ICD-10-CM | POA: Diagnosis not present

## 2018-01-01 DIAGNOSIS — M4712 Other spondylosis with myelopathy, cervical region: Secondary | ICD-10-CM | POA: Diagnosis not present

## 2018-01-01 DIAGNOSIS — E1142 Type 2 diabetes mellitus with diabetic polyneuropathy: Secondary | ICD-10-CM | POA: Diagnosis not present

## 2018-01-04 DIAGNOSIS — M4712 Other spondylosis with myelopathy, cervical region: Secondary | ICD-10-CM | POA: Diagnosis not present

## 2018-01-04 DIAGNOSIS — E1142 Type 2 diabetes mellitus with diabetic polyneuropathy: Secondary | ICD-10-CM | POA: Diagnosis not present

## 2018-01-04 DIAGNOSIS — J439 Emphysema, unspecified: Secondary | ICD-10-CM | POA: Diagnosis not present

## 2018-01-04 DIAGNOSIS — C189 Malignant neoplasm of colon, unspecified: Secondary | ICD-10-CM | POA: Diagnosis not present

## 2018-01-04 DIAGNOSIS — C7801 Secondary malignant neoplasm of right lung: Secondary | ICD-10-CM | POA: Diagnosis not present

## 2018-01-04 DIAGNOSIS — L304 Erythema intertrigo: Secondary | ICD-10-CM | POA: Diagnosis not present

## 2018-01-07 DIAGNOSIS — M4712 Other spondylosis with myelopathy, cervical region: Secondary | ICD-10-CM | POA: Diagnosis not present

## 2018-01-07 DIAGNOSIS — L304 Erythema intertrigo: Secondary | ICD-10-CM | POA: Diagnosis not present

## 2018-01-07 DIAGNOSIS — E1142 Type 2 diabetes mellitus with diabetic polyneuropathy: Secondary | ICD-10-CM | POA: Diagnosis not present

## 2018-01-07 DIAGNOSIS — C7801 Secondary malignant neoplasm of right lung: Secondary | ICD-10-CM | POA: Diagnosis not present

## 2018-01-07 DIAGNOSIS — C189 Malignant neoplasm of colon, unspecified: Secondary | ICD-10-CM | POA: Diagnosis not present

## 2018-01-07 DIAGNOSIS — J439 Emphysema, unspecified: Secondary | ICD-10-CM | POA: Diagnosis not present

## 2018-01-08 DIAGNOSIS — L84 Corns and callosities: Secondary | ICD-10-CM | POA: Diagnosis not present

## 2018-01-08 DIAGNOSIS — M79676 Pain in unspecified toe(s): Secondary | ICD-10-CM | POA: Diagnosis not present

## 2018-01-08 DIAGNOSIS — E1142 Type 2 diabetes mellitus with diabetic polyneuropathy: Secondary | ICD-10-CM | POA: Diagnosis not present

## 2018-01-08 DIAGNOSIS — B351 Tinea unguium: Secondary | ICD-10-CM | POA: Diagnosis not present

## 2018-01-14 ENCOUNTER — Telehealth (HOSPITAL_COMMUNITY): Payer: Self-pay | Admitting: Internal Medicine

## 2018-01-14 ENCOUNTER — Ambulatory Visit (HOSPITAL_COMMUNITY): Payer: Medicare Other

## 2018-01-14 ENCOUNTER — Other Ambulatory Visit (HOSPITAL_COMMUNITY): Payer: Medicare Other

## 2018-01-15 ENCOUNTER — Ambulatory Visit (HOSPITAL_COMMUNITY)
Admission: RE | Admit: 2018-01-15 | Discharge: 2018-01-15 | Disposition: A | Discharge status: Home / Self Care | Payer: Medicare Other | Source: Ambulatory Visit | Attending: Internal Medicine | Admitting: Internal Medicine

## 2018-01-15 DIAGNOSIS — J439 Emphysema, unspecified: Secondary | ICD-10-CM | POA: Diagnosis not present

## 2018-01-15 DIAGNOSIS — R918 Other nonspecific abnormal finding of lung field: Secondary | ICD-10-CM | POA: Insufficient documentation

## 2018-01-15 DIAGNOSIS — L304 Erythema intertrigo: Secondary | ICD-10-CM | POA: Diagnosis not present

## 2018-01-15 DIAGNOSIS — C189 Malignant neoplasm of colon, unspecified: Secondary | ICD-10-CM | POA: Diagnosis not present

## 2018-01-15 DIAGNOSIS — E1142 Type 2 diabetes mellitus with diabetic polyneuropathy: Secondary | ICD-10-CM | POA: Diagnosis not present

## 2018-01-15 DIAGNOSIS — R911 Solitary pulmonary nodule: Secondary | ICD-10-CM | POA: Diagnosis not present

## 2018-01-15 DIAGNOSIS — M4712 Other spondylosis with myelopathy, cervical region: Secondary | ICD-10-CM | POA: Diagnosis not present

## 2018-01-15 DIAGNOSIS — C7801 Secondary malignant neoplasm of right lung: Secondary | ICD-10-CM | POA: Diagnosis not present

## 2018-01-15 LAB — GLUCOSE, CAPILLARY: Glucose-Capillary: 118 mg/dL — ABNORMAL HIGH (ref 70–99)

## 2018-01-15 MED ORDER — FLUDEOXYGLUCOSE F - 18 (FDG) INJECTION
13.7000 | Freq: Once | INTRAVENOUS | Status: AC
  Administered 2018-01-15: 13.7 via INTRAVENOUS

## 2018-01-16 ENCOUNTER — Other Ambulatory Visit (HOSPITAL_COMMUNITY): Payer: Medicare Other

## 2018-01-16 ENCOUNTER — Encounter (HOSPITAL_COMMUNITY): Payer: Medicare Other

## 2018-01-16 ENCOUNTER — Ambulatory Visit (HOSPITAL_COMMUNITY): Payer: Medicare Other | Admitting: Internal Medicine

## 2018-01-17 ENCOUNTER — Ambulatory Visit (INDEPENDENT_AMBULATORY_CARE_PROVIDER_SITE_OTHER): Payer: Medicare Other | Admitting: Otolaryngology

## 2018-01-17 ENCOUNTER — Inpatient Hospital Stay (HOSPITAL_COMMUNITY): Payer: Medicare Other | Attending: Internal Medicine

## 2018-01-17 DIAGNOSIS — R070 Pain in throat: Secondary | ICD-10-CM

## 2018-01-17 DIAGNOSIS — C189 Malignant neoplasm of colon, unspecified: Secondary | ICD-10-CM | POA: Diagnosis not present

## 2018-01-17 DIAGNOSIS — Z9221 Personal history of antineoplastic chemotherapy: Secondary | ICD-10-CM | POA: Diagnosis not present

## 2018-01-17 DIAGNOSIS — I10 Essential (primary) hypertension: Secondary | ICD-10-CM | POA: Insufficient documentation

## 2018-01-17 DIAGNOSIS — Z794 Long term (current) use of insulin: Secondary | ICD-10-CM | POA: Insufficient documentation

## 2018-01-17 DIAGNOSIS — C7800 Secondary malignant neoplasm of unspecified lung: Secondary | ICD-10-CM | POA: Diagnosis not present

## 2018-01-17 DIAGNOSIS — Z923 Personal history of irradiation: Secondary | ICD-10-CM | POA: Diagnosis not present

## 2018-01-17 DIAGNOSIS — J439 Emphysema, unspecified: Secondary | ICD-10-CM | POA: Insufficient documentation

## 2018-01-17 DIAGNOSIS — Z7982 Long term (current) use of aspirin: Secondary | ICD-10-CM | POA: Diagnosis not present

## 2018-01-17 DIAGNOSIS — Z87891 Personal history of nicotine dependence: Secondary | ICD-10-CM | POA: Diagnosis not present

## 2018-01-17 DIAGNOSIS — Z79899 Other long term (current) drug therapy: Secondary | ICD-10-CM | POA: Insufficient documentation

## 2018-01-17 DIAGNOSIS — K219 Gastro-esophageal reflux disease without esophagitis: Secondary | ICD-10-CM

## 2018-01-17 DIAGNOSIS — E119 Type 2 diabetes mellitus without complications: Secondary | ICD-10-CM | POA: Diagnosis not present

## 2018-01-17 LAB — CBC WITH DIFFERENTIAL/PLATELET
Abs Immature Granulocytes: 0.02 10*3/uL (ref 0.00–0.07)
Basophils Absolute: 0.1 10*3/uL (ref 0.0–0.1)
Basophils Relative: 1 %
Eosinophils Absolute: 0.2 10*3/uL (ref 0.0–0.5)
Eosinophils Relative: 2 %
HCT: 39.5 % (ref 39.0–52.0)
Hemoglobin: 12.2 g/dL — ABNORMAL LOW (ref 13.0–17.0)
Immature Granulocytes: 0 %
Lymphocytes Relative: 13 %
Lymphs Abs: 1.2 10*3/uL (ref 0.7–4.0)
MCH: 31.2 pg (ref 26.0–34.0)
MCHC: 30.9 g/dL (ref 30.0–36.0)
MCV: 101 fL — ABNORMAL HIGH (ref 80.0–100.0)
Monocytes Absolute: 0.8 10*3/uL (ref 0.1–1.0)
Monocytes Relative: 8 %
Neutro Abs: 7.2 10*3/uL (ref 1.7–7.7)
Neutrophils Relative %: 76 %
Platelets: 231 10*3/uL (ref 150–400)
RBC: 3.91 MIL/uL — ABNORMAL LOW (ref 4.22–5.81)
RDW: 13.6 % (ref 11.5–15.5)
WBC: 9.5 10*3/uL (ref 4.0–10.5)
nRBC: 0 % (ref 0.0–0.2)

## 2018-01-17 LAB — COMPREHENSIVE METABOLIC PANEL
ALT: 17 U/L (ref 0–44)
AST: 25 U/L (ref 15–41)
Albumin: 3.2 g/dL — ABNORMAL LOW (ref 3.5–5.0)
Alkaline Phosphatase: 67 U/L (ref 38–126)
Anion gap: 9 (ref 5–15)
BUN: 23 mg/dL (ref 8–23)
CO2: 19 mmol/L — ABNORMAL LOW (ref 22–32)
Calcium: 8.5 mg/dL — ABNORMAL LOW (ref 8.9–10.3)
Chloride: 108 mmol/L (ref 98–111)
Creatinine, Ser: 1.01 mg/dL (ref 0.61–1.24)
GFR calc Af Amer: >60 mL/min (ref >60)
GFR calc non Af Amer: >60 mL/min (ref >60)
Glucose, Bld: 171 mg/dL — ABNORMAL HIGH (ref 70–99)
Potassium: 4.5 mmol/L (ref 3.5–5.1)
Sodium: 136 mmol/L (ref 135–145)
Total Bilirubin: 0.6 mg/dL (ref 0.3–1.2)
Total Protein: 6.7 g/dL (ref 6.5–8.1)

## 2018-01-17 LAB — FERRITIN: Ferritin: 30 ng/mL (ref 24–336)

## 2018-01-17 LAB — LACTATE DEHYDROGENASE: LDH: 187 U/L (ref 98–192)

## 2018-01-18 LAB — CEA: CEA: 3.7 ng/mL (ref 0.0–4.7)

## 2018-01-22 ENCOUNTER — Inpatient Hospital Stay (HOSPITAL_BASED_OUTPATIENT_CLINIC_OR_DEPARTMENT_OTHER): Payer: Medicare Other | Admitting: Internal Medicine

## 2018-01-22 ENCOUNTER — Encounter (HOSPITAL_COMMUNITY): Payer: Self-pay | Admitting: Internal Medicine

## 2018-01-22 VITALS — BP 128/40 | HR 79 | Temp 97.7°F | Resp 18

## 2018-01-22 DIAGNOSIS — J439 Emphysema, unspecified: Secondary | ICD-10-CM | POA: Diagnosis not present

## 2018-01-22 DIAGNOSIS — Z87891 Personal history of nicotine dependence: Secondary | ICD-10-CM | POA: Diagnosis not present

## 2018-01-22 DIAGNOSIS — Z7982 Long term (current) use of aspirin: Secondary | ICD-10-CM | POA: Diagnosis not present

## 2018-01-22 DIAGNOSIS — C189 Malignant neoplasm of colon, unspecified: Secondary | ICD-10-CM | POA: Diagnosis not present

## 2018-01-22 DIAGNOSIS — C7800 Secondary malignant neoplasm of unspecified lung: Secondary | ICD-10-CM

## 2018-01-22 DIAGNOSIS — Z923 Personal history of irradiation: Secondary | ICD-10-CM | POA: Diagnosis not present

## 2018-01-22 DIAGNOSIS — Z79899 Other long term (current) drug therapy: Secondary | ICD-10-CM

## 2018-01-22 DIAGNOSIS — Z9221 Personal history of antineoplastic chemotherapy: Secondary | ICD-10-CM | POA: Diagnosis not present

## 2018-01-22 DIAGNOSIS — I10 Essential (primary) hypertension: Secondary | ICD-10-CM | POA: Diagnosis not present

## 2018-01-22 DIAGNOSIS — Z794 Long term (current) use of insulin: Secondary | ICD-10-CM | POA: Diagnosis not present

## 2018-01-22 DIAGNOSIS — E119 Type 2 diabetes mellitus without complications: Secondary | ICD-10-CM

## 2018-01-22 NOTE — Progress Notes (Signed)
Diagnosis Adenocarcinoma of colon ([place]) - Plan: CBC with Differential/Platelet, Comprehensive metabolic panel, Lactate dehydrogenase, Ferritin  Staging Cancer Staging Adenocarcinoma of colon [facility]) Staging form: Colon and Rectum, AJCC 7th Edition - Clinical stage from 10/04/2012: Stage IIA (T3, N0, M0) - Signed by Baird Cancer, PA-C on 07/15/2015 - Pathologic stage from 09/17/2015: Stage IVA (T3, N0, M1a) - Signed by Baird Cancer, PA-C on 10/05/2015   Assessment and Plan:  1.  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 [name] followed by 6 months worth of adjuvant chemotherapy consisting of FOLFOX with a change in treatment to infusional 5-FU for progressive peripheral neuropathy to finish 6 months worth of treatment by Dr. [name].  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 [name].  Pt was last treated with SBRT of right lung from 01/02/2017 to 01/12/2017.    CAP done 05/29/2017 showed  IMPRESSION: 1. Area of presumed post radiation change in the medial posterior right lower lobe measures minimally larger today although there is some adjacent atelectasis on today's exam and a new tiny right pleural effusion. Continued close attention on follow-up recommended. 2. No new or progressive findings in the abdomen/pelvis to suggest metastatic disease. 3. 11 mm hyperattenuating posterior right liver lesion unchanged. 4. Previously identified right omental nodule is stable. 5.  Aortic Atherosclerois (ICD10-170.0) 6.  Emphysema. (EXH37-J69.9)  PET scan that was done 08/27/2017  showed  IMPRESSION: 1. Focal hypermetabolism (max SUV 5.3) within masslike focus of consolidation in the medial basilar right lower lobe,  suspicious for residual/recurrent tumor. 2. No additional hypermetabolic sites of metastatic disease. 3. New tiny 3 mm solid anterior right upper lobe pulmonary nodule, not definitely seen on prior chest CT, below PET resolution. Recommend attention on follow-up chest CT in 3 months. 4. Trace dependent right pleural effusion. 5.  Aortic Atherosclerosis (ICD10-I70.0).  I discussed with him that due to known evidence of metastatic involvement of the lung from colorectal cancer he is recommended for systemic therapy.  Pt was treated with Xeloda dosed at 1000 mg/m twice a day for 14 days with 1 week off.  Based on results of the Avex  trial, I have spoken with him regarding addition of Avastin dosed at 7.5 mg/kg every 21 days with Xeloda.  He initially tolerated Xeloda and denied significant diarrhea.    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  He has negative NRAS, KRAS and HRAS.    Pt has been in nursing facility due to extensive rash on bottom area.  He is now being followed by home health.  I have discussed with him treatment will remain on hold for now and he was set up for repeat imaging with PET scan that was done 01/15/2018 that was reviewed and showed  IMPRESSION:   1. Similar size and metabolic activity RIGHT lower lobe nodule concerning for residual carcinoma. 2. No evidence of new metastatic disease in the thorax. 3. No evidence of local recurrence or metastasis in the abdomen pelvis.  Pt has stable findings on recent imaging . He has option to defer therapy to allow ongoing recovery and look and beginning therapy in 02/2018.  I have also discussed options of Stivarga with low dose of 80 mg to begin with to assess for tolerance of therapy based  on results of Correct study that showed increase in MS and PFS with Stivarga in pts who had been treated with several lines of prior therapy.  Side effects of the medication reviewed with the patient and he was provided written information.   He will RTC in 02/2018 for follow-up.   2.  Right lung lesion.  Pt had CT of chest done 05/29/2017 that showed Medial right lower lobe pleural-based density is similar to prior measuring 3.5 cm long axis today compared to 2.8 previously. No new pulmonary nodule or mass. Tiny right pleural effusion is new in the interval.  PET scan  that was done 08/27/2017 showed  IMPRESSION: 1. Focal hypermetabolism (max SUV 5.3) within masslike focus of consolidation in the medial basilar right lower lobe, suspicious for residual/recurrent tumor. 2. No additional hypermetabolic sites of metastatic disease. 3. New tiny 3 mm solid anterior right upper lobe pulmonary nodule, not definitely seen on prior chest CT, below PET resolution. Recommend attention on follow-up chest CT in 3 months  in 08/2017 for short interval evaluation.    Case was reviewed at tumor conference and pt is not a candidate for additional RT.  He is recommended for systemic therapy.  Pt was started on Xeloda and Avastin which is now on hold due to skin breakdown.  Recent PET scan done 01/2018 shows stable lung findings.  He will RTC in 02/2018 for follow-up to allow more time for healing.   3.  Skin breakdown.  Pt is being followed by home health.  Home health to continue updated reports concerning pt progress.  Pt will continue to hold therapy to allow more time for healing.    4.  PAC.  Continue port flushes as directed.    5.  HTN.  BP is 128/40.  Follow-up with PCP.    25 minutes spent with more than 50% spent in counseling and coordination of care.    Interval  history: Historical data obtained from the note dated 06/05/2017.  77 y.o. 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 [name] followed  by 6 months worth of adjuvant chemotherapy consisting of FOLFOX with a change in treatment to infusional 5-FU for progressive peripheral neuropathy to finish 6 months worth of treatment by Dr. [name].  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 [name].  Current Status:  Pt is seen today for follow-up.  He is here to go over labs and scans.  He is accompanied by family member    Adenocarcinoma of colon ([place])   10/04/2012 Definitive Surgery    Partial collectomy by Dr. [name]    10/04/2012 Pathology Results    4.5 cm poorly differentiated adenocarcinoma, negative margins, 0/7 lymph nodes for metastatic disease.  Oncotype recurrence score of 26 (high)     Chemotherapy    FOLFOX     Adverse Reaction    Progressive peripheral neuropathy     Chemotherapy    Infusional 5 FU.  Completed 6 months worth of adjuvant therapy.    02/23/2014 Procedure    Colonoscopy by Dr. Anthony Sar.    07/01/2015 PET scan    There is malignant range uptake with RLL pulmonary nodule suspicious for metastatic disease or primary pulm neoplasm. Nonspecific focus of uptake in the L femoral neck.  No corresponding CT abnormality. Cannot rule out metastasis.    07/06/2015 Imaging    MR C-spine- C5-6 there is a broad-based disc bulge w R paracentral disc protrusion deforming the spinal cord. Moderate R foraminal stenosis. C6-7 there is a central disc protrusion slightly eccentric towards the R and mildly deforming the ventral c-spine     09/16/2015 Procedure    RLL needle biopsy by IR.    09/17/2015 Pathology Results    Metastatic adenocarcinoma consistent with a primary colorectal adenocarcinoma.    10/12/2015 - 10/22/2015 Radiation Therapy    SBRT by Dr. [name] to RLL pulmonary lesion (biopsy proven to be oligometastasis).    12/09/2015 Imaging    CT chest- 1. Slight interval decrease in size of the right lower lobe pulmonary nodule. Some surrounding radiation changes. 2. No  mediastinal or hilar mass or adenopathy. 3. No new pulmonary nodules. 4. Stable underline emphysematous changes. 5. Cirrhotic changes involving the liver but no upper abdominal metastatic disease.    03/14/2016 Imaging    CT chest- 1. Continued mild reduction in the size of the treated right lobe pulmonary nodule. 2. Increased patchy consolidation, reticulation and ground-glass attenuation in the basilar right lower lobe, nonspecific, favor evolving postradiation change, recommend attention on follow-up chest CT. 3. No evidence of new or progressive metastatic disease in the chest. 4. Aortic atherosclerosis. Left main and 3 vessel coronary atherosclerosis. 5. Mild emphysema.    06/30/2016 Imaging    CT CAP- 1. The appearance of the treated right lower lobe nodule is essentially unchanged, and there are evolving postradiation changes in the base of the right lower lobe, as above. No definite signs of new metastatic disease are noted elsewhere in the chest, abdomen or pelvis. 2. Aortic atherosclerosis, in addition to left main and 3 vessel coronary artery disease. Assessment for potential risk factor modification, dietary therapy or pharmacologic therapy may be warranted, if clinically indicated. 3. Morphologic changes in the liver suggestive of cirrhosis, as above. 4. Additional incidental findings, similar prior studies, as above.    11/17/2016 Imaging    CT CAP Study Result   CLINICAL DATA:  Stage IV colon cancer originally diagnosed in August 2014 status post partial colectomy, with right lower lobe lung metastasis diagnosed August 2017 status post SBRT completed 10/22/2015. Restaging.  EXAM: CT CHEST, ABDOMEN, AND PELVIS WITH CONTRAST  TECHNIQUE: Multidetector CT imaging of the chest, abdomen and pelvis was performed following the standard protocol during bolus administration of intravenous contrast.  CONTRAST:  159m ISOVUE-300 IOPAMIDOL (ISOVUE-300) INJECTION  61%  COMPARISON:  06/30/2016 CT chest, abdomen and pelvis.  FINDINGS: CT CHEST FINDINGS  Cardiovascular: Normal heart size. No significant pericardial fluid/thickening. Left main, left anterior descending, left circumflex and right coronary atherosclerosis. Atherosclerotic nonaneurysmal thoracic aorta. Normal caliber pulmonary arteries. No central pulmonary emboli. Right internal jugular MediPort terminates at the junction of the right and left brachiocephalic veins.  Mediastinum/Nodes: No discrete thyroid nodules. Unremarkable esophagus. No pathologically enlarged axillary, mediastinal or hilar lymph nodes.  Lungs/Pleura: No pneumothorax. No pleural effusion. There is a nodular 2.7 x 1.6 cm focus of consolidation in the medial basilar right lower lobe (series 3/ image 108), previously 1.6 x 1.1 cm. No appreciable change in patchy subpleural reticulation in both lungs without significant traction bronchiectasis or frank honeycombing. Mild centrilobular and paraseptal emphysema with mild diffuse bronchial wall thickening. No acute consolidative airspace disease or new significant pulmonary nodules.  Musculoskeletal: No aggressive appearing focal osseous lesions. Moderate thoracic spondylosis. Stable chronic left  scapular deformity.  CT ABDOMEN PELVIS FINDINGS  Hepatobiliary: Normal liver with no liver mass. Cholecystectomy. No biliary ductal dilatation.  Pancreas: Normal, with no mass or duct dilation.  Spleen: Normal size. No mass.  Adrenals/Urinary Tract: Normal adrenals. No hydronephrosis. No renal masses. Normal bladder.  Stomach/Bowel: Grossly normal stomach. Normal caliber small bowel with no small bowel wall thickening. Stable appearance of the appendix, within normal limits. Stable postsurgical changes from partial distal colectomy with intact appearing distal colonic anastomosis. Oral contrast transits to the cecum. No large bowel wall thickening or  new pericholecystic fat stranding .  Vascular/Lymphatic: Atherosclerotic nonaneurysmal abdominal aorta. Patent portal, splenic, hepatic and renal veins. No pathologically enlarged lymph nodes in the abdomen or pelvis.  Reproductive:  Normal size prostate.  Other: No pneumoperitoneum, ascites or focal fluid collection. Stable subcutaneous calcified granulomas throughout the left lower back and gluteal regions.  Musculoskeletal: No aggressive appearing focal osseous lesions. Marked lumbar spondylosis.  IMPRESSION: 1. Nodular focus of consolidation in the medial basilar right lower lobe is increased in size. While this may represent evolving masslike radiation fibrosis, recurrence of the lung metastasis is not excluded. PET-CT would be useful for further evaluation. Otherwise, continued close chest CT surveillance is advised . 2. No additional potential new sites of metastatic disease in the chest, abdomen or pelvis. 3. No evidence of local tumor recurrence at the distal colonic anastomosis. 4. Left main and 3 vessel coronary atherosclerosis . 5. Aortic Atherosclerosis (ICD10-I70.0) and Emphysema (ICD10-J43.9).       11/30/2016 Relapse/Recurrence    PET: IMPRESSION: 1. Enlarging right lower lobe pulmonary nodule and slight increase in the SUV max suggesting residual neoplasm. No new pulmonary lesions. 2. No findings for local recurrence or abdominal/pelvic metastatic disease.    01/02/2017 - 01/12/2017 Radiation Therapy     The patient saw Dr. [name] for SBRT radiation treatment. The tumor in the right lung was treated with a course of stereotactic body radiation treatment. The patient received 50 Gy In 5 fractions at 10 Gy per fraction.       10/05/2017 -  Chemotherapy    The patient had bevacizumab (AVASTIN) 900 mg in sodium chloride 0.9 % 100 mL chemo infusion, 7.3 mg/kg = 925 mg, Intravenous,  Once, 1 of 3 cycles Administration: 900 mg (10/05/2017)  for chemotherapy  treatment.       Problem List Patient Active Problem List   Diagnosis Date Noted  . Lung metastasis ([place]) [C78.00] 10/20/2015  . 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 Adenocarcinoma of colon ([place]) [C18.9] 07/15/2015  . Sepsis ([place]) [A41.9] 03/19/2013  . CHF, chronic ([place]) [I50.9] 03/19/2013  . Diarrhea [R19.7] 02/19/2013  . Dvt femoral (deep venous thrombosis) ([place]) [I82.419] 02/19/2013  . HTN (hypertension) [I10] 09/09/2012  . Diabetes mellitus ([place]) [E11.9] 09/09/2012  . Morbid obesity ([place]) [E66.01] 09/09/2012  . Sleep apnea [G47.30] 09/09/2012  . CKD (chronic kidney disease) [N18.9] 09/09/2012    Past Medical History Past Medical History:  Diagnosis Date  . Adenocarcinoma of colon ([place])    RLL -  Pulmonary nodule (06/2015, concerning for mets vs primary; to see RAD-ONC Dr. [name]); stage 2 adenocarinoma   2014 - Colon Cancer - surgery and chemo  . Blood infection ([place])   . Cystitis   . Diabetes mellitus without complication ([place])    Type II  . DVT (deep venous thrombosis) ([place]) 2014   post op  . Hematuria   . Hypertension   . Neuropathy   . Prostatitis   .  Pulmonary nodule   . Sleep apnea     Past Surgical History Past Surgical History:  Procedure Laterality Date  . BACK SURGERY    . CATARACT EXTRACTION     left  . CATARACT EXTRACTION W/PHACO Right 04/11/2012   Procedure: CATARACT EXTRACTION PHACO AND INTRAOCULAR LENS PLACEMENT (IOC);  Surgeon: Tonny Branch, MD;  Location: [facility] ORS;  Service: Ophthalmology;  Laterality: Right;  CDE:62.48  . CERVICAL FUSION     c4-c5  . CHOLECYSTECTOMY    . COLON SURGERY  09/29/12   Colon resection for cancer  . COLONOSCOPY    . EYE SURGERY     cataract  . GIVENS CAPSULE STUDY N/A 03/15/2015   Procedure: GIVENS CAPSULE STUDY;  Surgeon: Rogene Houston, MD;  Location: [facility] ENDO SUITE;  Service: Endoscopy;  Laterality: N/A;  730  . LEG SURGERY    . ORIF FEMUR FRACTURE     right  . POSTERIOR CERVICAL  FUSION/FORAMINOTOMY N/A 07/27/2015   Procedure: Cervical four to Cervical seven Laminectomy with Cervical four to Thoracic one Dorsal internal fixation and fusion;  Surgeon: Kevan Ny Ditty, MD;  Location: [place] [facility] ORS;  Service: Neurosurgery;  Laterality: N/A;  C4 to C7 Laminectomy with C4 to T1 Dorsal internal fixation and fusion    Family History Family History  Problem Relation Age of Onset  . Heart disease Father   . Heart attack Father   . Alzheimer's disease Mother      Social History  reports that he has quit smoking. His smoking use included cigarettes. He started smoking about 63 years ago. He has a 55.00 pack-year smoking history. He has never used smokeless tobacco. He reports previous alcohol use of about 3.0 standard drinks of alcohol per week. He reports that he does not use drugs.  Medications  Current Outpatient Medications:  .  silver sulfADIAZINE (SILVADENE) 1 % cream, Apply topically., Disp: , Rfl:  .  aspirin EC 81 MG tablet, Take 81 mg by mouth daily., Disp: , Rfl:  .  atorvastatin (LIPITOR) 10 MG tablet, Take 10 mg by mouth daily at 6 PM. , Disp: , Rfl:  .  furosemide (LASIX) 20 MG tablet, Take 20 mg by mouth every other day. , Disp: , Rfl:  .  gabapentin (NEURONTIN) 300 MG capsule, Take 2 capsules (600 mg total) by mouth 3 (three) times daily. (Patient taking differently: Take 600 mg by mouth 2 (two) times daily. ), Disp: 180 capsule, Rfl: 2 .  HYDROcodone-acetaminophen (NORCO/VICODIN) 5-325 MG tablet, , Disp: , Rfl:  .  insulin NPH (HUMULIN N,NOVOLIN N) 100 UNIT/ML injection, Inject 15-30 Units into the skin See admin instructions. 25 units every morning and 14 units in the evening, Disp: , Rfl:  .  lisinopril (PRINIVIL,ZESTRIL) 40 MG tablet, Take 40 mg by mouth daily., Disp: , Rfl:  .  naproxen (NAPROSYN) 500 MG tablet, TAKE 1 TABLET BY MOUTH TWICE A DAY WITH MEALS, Disp: , Rfl:  .  nystatin-triamcinolone (MYCOLOG II) cream, Apply topically., Disp: , Rfl:  .   OXYGEN, Inhale 2 L into the lungs continuous. At night time only, Disp: , Rfl:  .  silver sulfADIAZINE (SILVADENE) 1 % cream, , Disp: , Rfl:   Allergies Patient has no known allergies.  Review of Systems Review of Systems - Oncology ROS negative   Physical Exam  Vitals Wt Readings from Last 3 Encounters:  10/19/17 221 lb (100.2 kg)  10/05/17 272 lb (123.4 kg)  10/02/17 269 lb 12.8 oz (  122.4 kg)   Temp Readings from Last 3 Encounters:  01/22/18 97.7 F (36.5 C) (Oral)  12/14/17 97.9 F (36.6 C) (Oral)  10/19/17 97.6 F (36.4 C) (Oral)   BP Readings from Last 3 Encounters:  01/22/18 (!) 128/40  12/14/17 (!) 105/44  10/19/17 (!) 102/37   Pulse Readings from Last 3 Encounters:  01/22/18 79  12/14/17 (!) 105  10/19/17 94   Constitutional: Well-developed, well-nourished, and in no distress.   HENT: Head: Normocephalic and atraumatic.  Mouth/Throat: No oropharyngeal exudate. Mucosa moist. Eyes: Pupils are equal, round, and reactive to light. Conjunctivae are normal. No scleral icterus.  Neck: Normal range of motion. Neck supple. No JVD present.  Cardiovascular: Normal rate, regular rhythm and normal heart sounds.  Exam reveals no gallop and no friction rub.   No murmur heard. Pulmonary/Chest: Effort normal and breath sounds normal. No respiratory distress. No wheezes.No rales.  Abdominal: Soft. Bowel sounds are normal. No distension. There is no tenderness. There is no guarding.  Musculoskeletal: No edema or tenderness.  Lymphadenopathy: No cervical, axillary or supraclavicular adenopathy.  Neurological: Alert and oriented to person, place, and time. No cranial nerve deficit.  Skin: Skin is warm and dry. No rash noted. No erythema. No pallor. Mild skin breakdown on right abdominal area.   Psychiatric: Affect and judgment normal.   Labs No visits with results within 3 Day(s) from this visit.  Latest known visit with results is:  Appointment on 01/17/2018  Component  Date Value Ref Range Status  . WBC 01/17/2018 9.5  4.0 - 10.5 K/uL Final  . RBC 01/17/2018 3.91* 4.22 - 5.81 MIL/uL Final  . Hemoglobin 01/17/2018 12.2* 13.0 - 17.0 g/dL Final  . HCT 01/17/2018 39.5  39.0 - 52.0 % Final  . MCV 01/17/2018 101.0* 80.0 - 100.0 fL Final  . MCH 01/17/2018 31.2  26.0 - 34.0 pg Final  . MCHC 01/17/2018 30.9  30.0 - 36.0 g/dL Final  . RDW 01/17/2018 13.6  11.5 - 15.5 % Final  . Platelets 01/17/2018 231  150 - 400 K/uL Final  . nRBC 01/17/2018 0.0  0.0 - 0.2 % Final  . Neutrophils Relative % 01/17/2018 76  % Final  . Neutro Abs 01/17/2018 7.2  1.7 - 7.7 K/uL Final  . Lymphocytes Relative 01/17/2018 13  % Final  . Lymphs Abs 01/17/2018 1.2  0.7 - 4.0 K/uL Final  . Monocytes Relative 01/17/2018 8  % Final  . Monocytes Absolute 01/17/2018 0.8  0.1 - 1.0 K/uL Final  . Eosinophils Relative 01/17/2018 2  % Final  . Eosinophils Absolute 01/17/2018 0.2  0.0 - 0.5 K/uL Final  . Basophils Relative 01/17/2018 1  % Final  . Basophils Absolute 01/17/2018 0.1  0.0 - 0.1 K/uL Final  . Immature Granulocytes 01/17/2018 0  % Final  . Abs Immature Granulocytes 01/17/2018 0.02  0.00 - 0.07 K/uL Final   Performed at [facility], [address], [place], [place] [address]  . Sodium 01/17/2018 136  135 - 145 mmol/L Final  . Potassium 01/17/2018 4.5  3.5 - 5.1 mmol/L Final  . Chloride 01/17/2018 108  98 - 111 mmol/L Final  . CO2 01/17/2018 19* 22 - 32 mmol/L Final  . Glucose, Bld 01/17/2018 171* 70 - 99 mg/dL Final  . BUN 01/17/2018 23  8 - 23 mg/dL Final  . Creatinine, Ser 01/17/2018 1.01  0.61 - 1.24 mg/dL Final  . Calcium 01/17/2018 8.5* 8.9 - 10.3 mg/dL Final  . Total Protein 01/17/2018  6.7  6.5 - 8.1 g/dL Final  . Albumin 01/17/2018 3.2* 3.5 - 5.0 g/dL Final  . AST 01/17/2018 25  15 - 41 U/L Final  . ALT 01/17/2018 17  0 - 44 U/L Final  . Alkaline Phosphatase 01/17/2018 67  38 - 126 U/L Final  . Total Bilirubin 01/17/2018 0.6  0.3 - 1.2 mg/dL Final  . GFR calc non Af Amer  01/17/2018 >60  >60 mL/min Final  . GFR calc Af Amer 01/17/2018 >60  >60 mL/min Final  . Anion gap 01/17/2018 9  5 - 15 Final   Performed at [facility], [address] [place], [place] [address]  . LDH 01/17/2018 187  98 - 192 U/L Final   Performed at [facility], [address] [place], [place] [address]  . Ferritin 01/17/2018 30  24 - 336 ng/mL Final   Performed at [facility], [address], [place], [place] [address]  . CEA 01/17/2018 3.7  0.0 - 4.7 ng/mL Final   Comment: (NOTE)                             Nonsmokers          <3.9                             Smokers             <5.6 [employer] Electrochemiluminescence Immunoassay (ECLIA) Values obtained with different assay methods or kits cannot be used interchangeably.  Results cannot be interpreted as absolute evidence of the presence or absence of malignant disease. Performed At: [facility] [place], [place] [id number] Rush Farmer MD QN:[id number]      Pathology Orders Placed This Encounter  Procedures  . CBC with Differential/Platelet    Standing Status:   Future    Standing Expiration Date:   01/23/2020  . Comprehensive metabolic panel    Standing Status:   Future    Standing Expiration Date:   01/23/2020  . Lactate dehydrogenase    Standing Status:   Future    Standing Expiration Date:   01/23/2020  . Ferritin    Standing Status:   Future    Standing Expiration Date:   01/23/2020       Zoila Shutter MD

## 2018-01-22 NOTE — Patient Instructions (Signed)
[place] at [facility] Discharge Instructions  You were seen by Dr. Walden Field today   Thank you for choosing [facility] to provide your oncology and hematology care.  To afford each patient quality time with our provider, please arrive at least 15 minutes before your scheduled appointment time.   If you have a lab appointment with the [facility] please come in thru the  Main Entrance and check in at the main information desk  You need to re-schedule your appointment should you arrive 10 or more minutes late.  We strive to give you quality time with our providers, and arriving late affects you and other patients whose appointments are after yours.  Also, if you no show three or more times for appointments you may be dismissed from the clinic at the providers discretion.     Again, thank you for choosing [facility].  Our hope is that these requests will decrease the amount of time that you wait before being seen by our physicians.       _____________________________________________________________  Should you have questions after your visit to [facility], please contact our office at [phone] between the hours of 8:00 a.m. and 4:30 p.m.  Voicemails left after 4:00 p.m. will not be returned until the following business day.  For prescription refill requests, have your pharmacy contact our office and allow 72 hours.    Cancer Center Support Programs:   > Cancer Support Group  2nd Tuesday of the month 1pm-2pm, Journey Room

## 2018-01-23 ENCOUNTER — Other Ambulatory Visit (HOSPITAL_COMMUNITY): Payer: Medicare Other

## 2018-01-23 DIAGNOSIS — J439 Emphysema, unspecified: Secondary | ICD-10-CM | POA: Diagnosis not present

## 2018-01-23 DIAGNOSIS — C189 Malignant neoplasm of colon, unspecified: Secondary | ICD-10-CM | POA: Diagnosis not present

## 2018-01-23 DIAGNOSIS — L304 Erythema intertrigo: Secondary | ICD-10-CM | POA: Diagnosis not present

## 2018-01-23 DIAGNOSIS — M4712 Other spondylosis with myelopathy, cervical region: Secondary | ICD-10-CM | POA: Diagnosis not present

## 2018-01-23 DIAGNOSIS — C7801 Secondary malignant neoplasm of right lung: Secondary | ICD-10-CM | POA: Diagnosis not present

## 2018-01-23 DIAGNOSIS — E1142 Type 2 diabetes mellitus with diabetic polyneuropathy: Secondary | ICD-10-CM | POA: Diagnosis not present

## 2018-01-29 ENCOUNTER — Ambulatory Visit (HOSPITAL_COMMUNITY): Payer: Medicare Other | Admitting: Internal Medicine

## 2018-02-02 DIAGNOSIS — L304 Erythema intertrigo: Secondary | ICD-10-CM | POA: Diagnosis not present

## 2018-02-02 DIAGNOSIS — M4712 Other spondylosis with myelopathy, cervical region: Secondary | ICD-10-CM | POA: Diagnosis not present

## 2018-02-02 DIAGNOSIS — C7801 Secondary malignant neoplasm of right lung: Secondary | ICD-10-CM | POA: Diagnosis not present

## 2018-02-02 DIAGNOSIS — J439 Emphysema, unspecified: Secondary | ICD-10-CM | POA: Diagnosis not present

## 2018-02-02 DIAGNOSIS — C189 Malignant neoplasm of colon, unspecified: Secondary | ICD-10-CM | POA: Diagnosis not present

## 2018-02-02 DIAGNOSIS — E1142 Type 2 diabetes mellitus with diabetic polyneuropathy: Secondary | ICD-10-CM | POA: Diagnosis not present

## 2018-02-08 DIAGNOSIS — M4712 Other spondylosis with myelopathy, cervical region: Secondary | ICD-10-CM | POA: Diagnosis not present

## 2018-02-08 DIAGNOSIS — E1142 Type 2 diabetes mellitus with diabetic polyneuropathy: Secondary | ICD-10-CM | POA: Diagnosis not present

## 2018-02-08 DIAGNOSIS — L304 Erythema intertrigo: Secondary | ICD-10-CM | POA: Diagnosis not present

## 2018-02-08 DIAGNOSIS — J439 Emphysema, unspecified: Secondary | ICD-10-CM | POA: Diagnosis not present

## 2018-02-08 DIAGNOSIS — C7801 Secondary malignant neoplasm of right lung: Secondary | ICD-10-CM | POA: Diagnosis not present

## 2018-02-08 DIAGNOSIS — C189 Malignant neoplasm of colon, unspecified: Secondary | ICD-10-CM | POA: Diagnosis not present

## 2018-02-12 ENCOUNTER — Encounter (HOSPITAL_COMMUNITY): Payer: Self-pay | Admitting: Internal Medicine

## 2018-02-12 ENCOUNTER — Other Ambulatory Visit: Payer: Self-pay

## 2018-02-12 ENCOUNTER — Inpatient Hospital Stay (HOSPITAL_COMMUNITY): Payer: Medicare Other

## 2018-02-12 ENCOUNTER — Inpatient Hospital Stay (HOSPITAL_COMMUNITY): Payer: Medicare Other | Attending: Internal Medicine | Admitting: Internal Medicine

## 2018-02-12 VITALS — BP 123/54 | HR 112 | Temp 97.7°F | Resp 18 | Wt 261.0 lb

## 2018-02-12 DIAGNOSIS — Z923 Personal history of irradiation: Secondary | ICD-10-CM | POA: Diagnosis not present

## 2018-02-12 DIAGNOSIS — R21 Rash and other nonspecific skin eruption: Secondary | ICD-10-CM | POA: Diagnosis not present

## 2018-02-12 DIAGNOSIS — Z791 Long term (current) use of non-steroidal anti-inflammatories (NSAID): Secondary | ICD-10-CM | POA: Diagnosis not present

## 2018-02-12 DIAGNOSIS — E119 Type 2 diabetes mellitus without complications: Secondary | ICD-10-CM | POA: Diagnosis not present

## 2018-02-12 DIAGNOSIS — C189 Malignant neoplasm of colon, unspecified: Secondary | ICD-10-CM

## 2018-02-12 DIAGNOSIS — Z8249 Family history of ischemic heart disease and other diseases of the circulatory system: Secondary | ICD-10-CM | POA: Insufficient documentation

## 2018-02-12 DIAGNOSIS — Z86718 Personal history of other venous thrombosis and embolism: Secondary | ICD-10-CM | POA: Diagnosis not present

## 2018-02-12 DIAGNOSIS — Z7982 Long term (current) use of aspirin: Secondary | ICD-10-CM | POA: Insufficient documentation

## 2018-02-12 DIAGNOSIS — Z79899 Other long term (current) drug therapy: Secondary | ICD-10-CM | POA: Diagnosis not present

## 2018-02-12 DIAGNOSIS — Z794 Long term (current) use of insulin: Secondary | ICD-10-CM | POA: Insufficient documentation

## 2018-02-12 DIAGNOSIS — Z87891 Personal history of nicotine dependence: Secondary | ICD-10-CM | POA: Diagnosis not present

## 2018-02-12 DIAGNOSIS — C7800 Secondary malignant neoplasm of unspecified lung: Secondary | ICD-10-CM | POA: Diagnosis not present

## 2018-02-12 DIAGNOSIS — Z9221 Personal history of antineoplastic chemotherapy: Secondary | ICD-10-CM

## 2018-02-12 LAB — CBC WITH DIFFERENTIAL/PLATELET
Abs Immature Granulocytes: 0.03 10*3/uL (ref 0.00–0.07)
Basophils Absolute: 0.1 10*3/uL (ref 0.0–0.1)
Basophils Relative: 1 %
Eosinophils Absolute: 0.1 10*3/uL (ref 0.0–0.5)
Eosinophils Relative: 2 %
HCT: 40 % (ref 39.0–52.0)
Hemoglobin: 12.7 g/dL — ABNORMAL LOW (ref 13.0–17.0)
Immature Granulocytes: 0 %
Lymphocytes Relative: 13 %
Lymphs Abs: 1.2 10*3/uL (ref 0.7–4.0)
MCH: 30.8 pg (ref 26.0–34.0)
MCHC: 31.8 g/dL (ref 30.0–36.0)
MCV: 97.1 fL (ref 80.0–100.0)
Monocytes Absolute: 0.9 10*3/uL (ref 0.1–1.0)
Monocytes Relative: 9 %
Neutro Abs: 6.8 10*3/uL (ref 1.7–7.7)
Neutrophils Relative %: 75 %
Platelets: 219 10*3/uL (ref 150–400)
RBC: 4.12 MIL/uL — ABNORMAL LOW (ref 4.22–5.81)
RDW: 13.3 % (ref 11.5–15.5)
WBC: 9.1 10*3/uL (ref 4.0–10.5)
nRBC: 0 % (ref 0.0–0.2)

## 2018-02-12 LAB — COMPREHENSIVE METABOLIC PANEL
ALT: 17 U/L (ref 0–44)
AST: 23 U/L (ref 15–41)
Albumin: 3.4 g/dL — ABNORMAL LOW (ref 3.5–5.0)
Alkaline Phosphatase: 65 U/L (ref 38–126)
Anion gap: 7 (ref 5–15)
BUN: 27 mg/dL — ABNORMAL HIGH (ref 8–23)
CO2: 23 mmol/L (ref 22–32)
Calcium: 9.2 mg/dL (ref 8.9–10.3)
Chloride: 107 mmol/L (ref 98–111)
Creatinine, Ser: 0.97 mg/dL (ref 0.61–1.24)
GFR calc Af Amer: >60 mL/min (ref >60)
GFR calc non Af Amer: >60 mL/min (ref >60)
Glucose, Bld: 145 mg/dL — ABNORMAL HIGH (ref 70–99)
Potassium: 5 mmol/L (ref 3.5–5.1)
Sodium: 137 mmol/L (ref 135–145)
Total Bilirubin: 0.6 mg/dL (ref 0.3–1.2)
Total Protein: 7 g/dL (ref 6.5–8.1)

## 2018-02-12 LAB — LACTATE DEHYDROGENASE: LDH: 183 U/L (ref 98–192)

## 2018-02-12 LAB — FERRITIN: Ferritin: 30 ng/mL (ref 24–336)

## 2018-02-12 NOTE — Progress Notes (Signed)
Diagnosis Adenocarcinoma of colon ([place]) - Plan: CBC with Differential/Platelet, Comprehensive metabolic panel, Lactate dehydrogenase, Ferritin, CEA  Staging Cancer Staging Adenocarcinoma of colon [facility]) Staging form: Colon and Rectum, AJCC 7th Edition - Clinical stage from 10/04/2012: Stage IIA (T3, N0, M0) - Signed by Baird Cancer, PA-C on 07/15/2015 - Pathologic stage from 09/17/2015: Stage IVA (T3, N0, M1a) - Signed by Baird Cancer, PA-C on 10/05/2015   Assessment and Plan:  1.  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 [name] followed by 6 months worth of adjuvant chemotherapy consisting of FOLFOX with a change in treatment to infusional 5-FU for progressive peripheral neuropathy to finish 6 months worth of treatment by Dr. [name].  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 [name].  Pt was last treated with SBRT of right lung from 01/02/2017 to 01/12/2017.    CAP done 05/29/2017 showed  IMPRESSION: 1. Area of presumed post radiation change in the medial posterior right lower lobe measures minimally larger today although there is some adjacent atelectasis on today's exam and a new tiny right pleural effusion. Continued close attention on follow-up recommended. 2. No new or progressive findings in the abdomen/pelvis to suggest metastatic disease. 3. 11 mm hyperattenuating posterior right liver lesion unchanged. 4. Previously identified right omental nodule is stable. 5.  Aortic Atherosclerois (ICD10-170.0) 6.  Emphysema. (TAV69-V94.9)  PET scan that was done 08/27/2017  showed  IMPRESSION: 1. Focal hypermetabolism (max SUV 5.3) within masslike focus of consolidation in the medial basilar right lower  lobe, suspicious for residual/recurrent tumor. 2. No additional hypermetabolic sites of metastatic disease. 3. New tiny 3 mm solid anterior right upper lobe pulmonary nodule, not definitely seen on prior chest CT, below PET resolution. Recommend attention on follow-up chest CT in 3 months. 4. Trace dependent right pleural effusion. 5.  Aortic Atherosclerosis (ICD10-I70.0).  I discussed with him that due to known evidence of metastatic involvement of the lung from colorectal cancer he is recommended for systemic therapy.  Pt was treated with Xeloda dosed at 1000 mg/m twice a day for 14 days with 1 week off.  Based on results of the Avex  trial, I have spoken with him regarding addition of Avastin dosed at 7.5 mg/kg every 21 days with Xeloda.  He initially tolerated Xeloda and denied significant diarrhea.    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  He has negative NRAS, KRAS and HRAS.    Pt has been in nursing facility due to extensive rash on bottom area.  He is now being followed by home health.  I have discussed with him treatment will remain on hold for now and he was set up for repeat imaging with PET scan that was done 01/15/2018 that was reviewed and showed  IMPRESSION:   1. Similar size and metabolic activity RIGHT lower lobe nodule concerning for residual carcinoma. 2. No evidence of new metastatic disease in the thorax. 3. No evidence of local recurrence or metastasis in the abdomen pelvis.  Pt has stable findings on recent imaging . He has option to defer therapy to allow ongoing recovery.  Previously, I discussed options of Stivarga with low dose of 80 mg to begin with to assess for tolerance of therapy based on results of Correct study that showed  increase in MS and PFS with Stivarga in pts who had been treated with several lines of prior therapy.  Side effects of the medication reviewed with the patient and he was provided written information.    Pt reports concerns about  area on right groin area that has not closed completely.  Will ask for home health to update on wound and determine if he needs ongoing home health care.  Pending their evaluation, will recommend he begin [place] in January or early February.  He will be seen for follow-up in 04/2018.  Labs done today 02/12/2018 reviewed and WNL.    2.  Right lung lesion.  Pt had CT of chest done 05/29/2017 that showed Medial right lower lobe pleural-based density is similar to prior measuring 3.5 cm long axis today compared to 2.8 previously. No new pulmonary nodule or mass. Tiny right pleural effusion is new in the interval.  PET scan  that was done 08/27/2017 showed  IMPRESSION: 1. Focal hypermetabolism (max SUV 5.3) within masslike focus of consolidation in the medial basilar right lower lobe, suspicious for residual/recurrent tumor. 2. No additional hypermetabolic sites of metastatic disease. 3. New tiny 3 mm solid anterior right upper lobe pulmonary nodule, not definitely seen on prior chest CT, below PET resolution. Recommend attention on follow-up chest CT in 3 months  in 08/2017 for short interval evaluation.    Case was reviewed at tumor conference and pt is not a candidate for additional RT.  He is recommended for systemic therapy.  Pt was started on Xeloda and Avastin which is now on hold due to skin breakdown.  PET scan done 01/2018 shows stable lung findings.   Pt reports concerns about area on right groin area that has not closed completely.  Will ask for home health to update on wound and determine if he needs ongoing home health care.  Pending their evaluation, will recommend he begin [place] in January or early February.  He will be seen for follow-up in 04/2018.  Labs done today 02/12/2018 reviewed and WNL.    3.  Skin breakdown.  Pt is being followed by home health. Pt reports concerns about area on right groin area that has not closed completely.  Will ask for home health to update on wound and  determine if he needs ongoing home health care.    4.  PAC.  Continue port flushes as directed.    5.  HTN.  BP is 123/54.   Follow-up with PCP.    Interval  history: Historical data obtained from the note dated 06/05/2017.  78 y.o. 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 [name] followed by 6 months worth of adjuvant chemotherapy consisting of FOLFOX with a change in treatment to infusional 5-FU for progressive peripheral neuropathy to finish 6 months worth of treatment by Dr. [name].  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 [name].  Current Status:  Pt is seen today for follow-up.  He is here to go over labs.  He is accompanied by family member.  He reports area on right side of groin is still open.      Adenocarcinoma of colon ([place])   10/04/2012 Definitive Surgery    Partial collectomy by Dr. [name]    10/04/2012 Pathology  Results    4.5 cm poorly differentiated adenocarcinoma, negative margins, 0/7 lymph nodes for metastatic disease.  Oncotype recurrence score of 26 (high)     Chemotherapy    FOLFOX     Adverse Reaction    Progressive peripheral neuropathy     Chemotherapy    Infusional 5 FU.  Completed 6 months worth of adjuvant therapy.    02/23/2014 Procedure    Colonoscopy by Dr. Anthony Sar.    07/01/2015 PET scan    There is malignant range uptake with RLL pulmonary nodule suspicious for metastatic disease or primary pulm neoplasm. Nonspecific focus of uptake in the L femoral neck. No corresponding CT abnormality. Cannot rule out metastasis.    07/06/2015 Imaging    MR C-spine- C5-6 there is a broad-based disc bulge w R paracentral disc protrusion deforming the spinal cord. Moderate R foraminal  stenosis. C6-7 there is a central disc protrusion slightly eccentric towards the R and mildly deforming the ventral c-spine     09/16/2015 Procedure    RLL needle biopsy by IR.    09/17/2015 Pathology Results    Metastatic adenocarcinoma consistent with a primary colorectal adenocarcinoma.    10/12/2015 - 10/22/2015 Radiation Therapy    SBRT by Dr. [name] to RLL pulmonary lesion (biopsy proven to be oligometastasis).    12/09/2015 Imaging    CT chest- 1. Slight interval decrease in size of the right lower lobe pulmonary nodule. Some surrounding radiation changes. 2. No mediastinal or hilar mass or adenopathy. 3. No new pulmonary nodules. 4. Stable underline emphysematous changes. 5. Cirrhotic changes involving the liver but no upper abdominal metastatic disease.    03/14/2016 Imaging    CT chest- 1. Continued mild reduction in the size of the treated right lobe pulmonary nodule. 2. Increased patchy consolidation, reticulation and ground-glass attenuation in the basilar right lower lobe, nonspecific, favor evolving postradiation change, recommend attention on follow-up chest CT. 3. No evidence of new or progressive metastatic disease in the chest. 4. Aortic atherosclerosis. Left main and 3 vessel coronary atherosclerosis. 5. Mild emphysema.    06/30/2016 Imaging    CT CAP- 1. The appearance of the treated right lower lobe nodule is essentially unchanged, and there are evolving postradiation changes in the base of the right lower lobe, as above. No definite signs of new metastatic disease are noted elsewhere in the chest, abdomen or pelvis. 2. Aortic atherosclerosis, in addition to left main and 3 vessel coronary artery disease. Assessment for potential risk factor modification, dietary therapy or pharmacologic therapy may be warranted, if clinically indicated. 3. Morphologic changes in the liver suggestive of cirrhosis, as above. 4. Additional incidental findings, similar prior  studies, as above.    11/17/2016 Imaging    CT CAP Study Result   CLINICAL DATA:  Stage IV colon cancer originally diagnosed in August 2014 status post partial colectomy, with right lower lobe lung metastasis diagnosed August 2017 status post SBRT completed 10/22/2015. Restaging.  EXAM: CT CHEST, ABDOMEN, AND PELVIS WITH CONTRAST  TECHNIQUE: Multidetector CT imaging of the chest, abdomen and pelvis was performed following the standard protocol during bolus administration of intravenous contrast.  CONTRAST:  159m ISOVUE-300 IOPAMIDOL (ISOVUE-300) INJECTION 61%  COMPARISON:  06/30/2016 CT chest, abdomen and pelvis.  FINDINGS: CT CHEST FINDINGS  Cardiovascular: Normal heart size. No significant pericardial fluid/thickening. Left main, left anterior descending, left circumflex and right coronary atherosclerosis. Atherosclerotic nonaneurysmal thoracic aorta. Normal caliber pulmonary arteries. No central pulmonary emboli. Right internal jugular MediPort terminates at the  junction of the right and left brachiocephalic veins.  Mediastinum/Nodes: No discrete thyroid nodules. Unremarkable esophagus. No pathologically enlarged axillary, mediastinal or hilar lymph nodes.  Lungs/Pleura: No pneumothorax. No pleural effusion. There is a nodular 2.7 x 1.6 cm focus of consolidation in the medial basilar right lower lobe (series 3/ image 108), previously 1.6 x 1.1 cm. No appreciable change in patchy subpleural reticulation in both lungs without significant traction bronchiectasis or frank honeycombing. Mild centrilobular and paraseptal emphysema with mild diffuse bronchial wall thickening. No acute consolidative airspace disease or new significant pulmonary nodules.  Musculoskeletal: No aggressive appearing focal osseous lesions. Moderate thoracic spondylosis. Stable chronic left scapular deformity.  CT ABDOMEN PELVIS FINDINGS  Hepatobiliary: Normal liver with no liver  mass. Cholecystectomy. No biliary ductal dilatation.  Pancreas: Normal, with no mass or duct dilation.  Spleen: Normal size. No mass.  Adrenals/Urinary Tract: Normal adrenals. No hydronephrosis. No renal masses. Normal bladder.  Stomach/Bowel: Grossly normal stomach. Normal caliber small bowel with no small bowel wall thickening. Stable appearance of the appendix, within normal limits. Stable postsurgical changes from partial distal colectomy with intact appearing distal colonic anastomosis. Oral contrast transits to the cecum. No large bowel wall thickening or new pericholecystic fat stranding .  Vascular/Lymphatic: Atherosclerotic nonaneurysmal abdominal aorta. Patent portal, splenic, hepatic and renal veins. No pathologically enlarged lymph nodes in the abdomen or pelvis.  Reproductive:  Normal size prostate.  Other: No pneumoperitoneum, ascites or focal fluid collection. Stable subcutaneous calcified granulomas throughout the left lower back and gluteal regions.  Musculoskeletal: No aggressive appearing focal osseous lesions. Marked lumbar spondylosis.  IMPRESSION: 1. Nodular focus of consolidation in the medial basilar right lower lobe is increased in size. While this may represent evolving masslike radiation fibrosis, recurrence of the lung metastasis is not excluded. PET-CT would be useful for further evaluation. Otherwise, continued close chest CT surveillance is advised . 2. No additional potential new sites of metastatic disease in the chest, abdomen or pelvis. 3. No evidence of local tumor recurrence at the distal colonic anastomosis. 4. Left main and 3 vessel coronary atherosclerosis . 5. Aortic Atherosclerosis (ICD10-I70.0) and Emphysema (ICD10-J43.9).       11/30/2016 Relapse/Recurrence    PET: IMPRESSION: 1. Enlarging right lower lobe pulmonary nodule and slight increase in the SUV max suggesting residual neoplasm. No new  pulmonary lesions. 2. No findings for local recurrence or abdominal/pelvic metastatic disease.    01/02/2017 - 01/12/2017 Radiation Therapy     The patient saw Dr. [name] for SBRT radiation treatment. The tumor in the right lung was treated with a course of stereotactic body radiation treatment. The patient received 50 Gy In 5 fractions at 10 Gy per fraction.       10/05/2017 -  Chemotherapy    The patient had bevacizumab (AVASTIN) 900 mg in sodium chloride 0.9 % 100 mL chemo infusion, 7.3 mg/kg = 925 mg, Intravenous,  Once, 1 of 3 cycles Administration: 900 mg (10/05/2017)  for chemotherapy treatment.       Problem List Patient Active Problem List   Diagnosis Date Noted  . Lung metastasis ([place]) [C78.00] 10/20/2015  . 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 Adenocarcinoma of colon ([place]) [C18.9] 07/15/2015  . Sepsis ([place]) [A41.9] 03/19/2013  . CHF, chronic ([place]) [I50.9] 03/19/2013  . Diarrhea [R19.7] 02/19/2013  . Dvt femoral (deep venous thrombosis) ([place]) [I82.419] 02/19/2013  . HTN (hypertension) [I10] 09/09/2012  . Diabetes mellitus ([place]) [E11.9] 09/09/2012  . Morbid obesity ([place]) [E66.01] 09/09/2012  .  Sleep apnea [G47.30] 09/09/2012  . CKD (chronic kidney disease) [N18.9] 09/09/2012    Past Medical History Past Medical History:  Diagnosis Date  . Adenocarcinoma of colon ([place])    RLL -  Pulmonary nodule (06/2015, concerning for mets vs primary; to see RAD-ONC Dr. [name]); stage 2 adenocarinoma   2014 - Colon Cancer - surgery and chemo  . Blood infection ([place])   . Cystitis   . Diabetes mellitus without complication ([place])    Type II  . DVT (deep venous thrombosis) ([place]) 2014   post op  . Hematuria   . Hypertension   . Neuropathy   . Prostatitis   . Pulmonary nodule   . Sleep apnea     Past Surgical History Past Surgical History:  Procedure Laterality Date  . BACK SURGERY    . CATARACT EXTRACTION     left  . CATARACT EXTRACTION W/PHACO Right  04/11/2012   Procedure: CATARACT EXTRACTION PHACO AND INTRAOCULAR LENS PLACEMENT (IOC);  Surgeon: Tonny Branch, MD;  Location: [facility] ORS;  Service: Ophthalmology;  Laterality: Right;  CDE:62.48  . CERVICAL FUSION     c4-c5  . CHOLECYSTECTOMY    . COLON SURGERY  09/29/12   Colon resection for cancer  . COLONOSCOPY    . EYE SURGERY     cataract  . GIVENS CAPSULE STUDY N/A 03/15/2015   Procedure: GIVENS CAPSULE STUDY;  Surgeon: Rogene Houston, MD;  Location: [facility] ENDO SUITE;  Service: Endoscopy;  Laterality: N/A;  730  . LEG SURGERY    . ORIF FEMUR FRACTURE     right  . POSTERIOR CERVICAL FUSION/FORAMINOTOMY N/A 07/27/2015   Procedure: Cervical four to Cervical seven Laminectomy with Cervical four to Thoracic one Dorsal internal fixation and fusion;  Surgeon: Kevan Ny Ditty, MD;  Location: [facility] ORS;  Service: Neurosurgery;  Laterality: N/A;  C4 to C7 Laminectomy with C4 to T1 Dorsal internal fixation and fusion    Family History Family History  Problem Relation Age of Onset  . Heart disease Father   . Heart attack Father   . Alzheimer's disease Mother      Social History  reports that he has quit smoking. His smoking use included cigarettes. He started smoking about 63 years ago. He has a 55.00 pack-year smoking history. He has never used smokeless tobacco. He reports previous alcohol use of about 3.0 standard drinks of alcohol per week. He reports that he does not use drugs.  Medications  Current Outpatient Medications:  .  aspirin EC 81 MG tablet, Take 81 mg by mouth daily., Disp: , Rfl:  .  atorvastatin (LIPITOR) 10 MG tablet, Take 10 mg by mouth daily at 6 PM. , Disp: , Rfl:  .  furosemide (LASIX) 20 MG tablet, Take 20 mg by mouth every other day. , Disp: , Rfl:  .  gabapentin (NEURONTIN) 300 MG capsule, Take 2 capsules (600 mg total) by mouth 3 (three) times daily. (Patient taking differently: Take 600 mg by mouth 2 (two) times daily. ), Disp: 180 capsule, Rfl: 2 .  insulin NPH  (HUMULIN N,NOVOLIN N) 100 UNIT/ML injection, Inject 15-30 Units into the skin See admin instructions. 25 units every morning and 14 units in the evening, Disp: , Rfl:  .  lisinopril (PRINIVIL,ZESTRIL) 40 MG tablet, Take 40 mg by mouth daily., Disp: , Rfl:  .  naproxen (NAPROSYN) 500 MG tablet, TAKE 1 TABLET BY MOUTH TWICE A DAY WITH MEALS, Disp: , Rfl:  .  OXYGEN,  Inhale 2 L into the lungs continuous. At night time only, Disp: , Rfl:  .  silver sulfADIAZINE (SILVADENE) 1 % cream, , Disp: , Rfl:  .  HYDROcodone-acetaminophen (NORCO/VICODIN) 5-325 MG tablet, , Disp: , Rfl:  .  nystatin-triamcinolone (MYCOLOG II) cream, Apply topically., Disp: , Rfl:   Allergies Patient has no known allergies.  Review of Systems Review of Systems - Oncology ROS negative   Physical Exam  Vitals Wt Readings from Last 3 Encounters:  02/12/18 261 lb (118.4 kg)  10/19/17 221 lb (100.2 kg)  10/05/17 272 lb (123.4 kg)   Temp Readings from Last 3 Encounters:  02/12/18 97.7 F (36.5 C) (Oral)  01/22/18 97.7 F (36.5 C) (Oral)  12/14/17 97.9 F (36.6 C) (Oral)   BP Readings from Last 3 Encounters:  02/12/18 (!) 123/54  01/22/18 (!) 128/40  12/14/17 (!) 105/44   Pulse Readings from Last 3 Encounters:  02/12/18 (!) 112  01/22/18 79  12/14/17 (!) 105   Constitutional: Well-developed, well-nourished, and in no distress.  Seated in wheelchair.   HENT: Head: Normocephalic and atraumatic.  Mouth/Throat: No oropharyngeal exudate. Mucosa moist. Eyes: Pupils are equal, round, and reactive to light. Conjunctivae are normal. No scleral icterus.  Neck: Normal range of motion. Neck supple. No JVD present.  Cardiovascular: Normal rate, regular rhythm and normal heart sounds.  Exam reveals no gallop and no friction rub.   No murmur heard. Pulmonary/Chest: Effort normal and breath sounds normal. No respiratory distress. No wheezes.No rales.  Abdominal: Soft. Bowel sounds are normal. No distension. There is no  tenderness. There is no guarding.  Musculoskeletal: No edema or tenderness.  Lymphadenopathy: No cervical, axillary or supraclavicular adenopathy.  Neurological: Alert and oriented to person, place, and time. No cranial nerve deficit.  Skin: Skin is warm and dry. No rash noted. No erythema. No pallor.  Psychiatric: Affect and judgment normal.   Labs Appointment on 02/12/2018  Component Date Value Ref Range Status  . WBC 02/12/2018 9.1  4.0 - 10.5 K/uL Final  . RBC 02/12/2018 4.12* 4.22 - 5.81 MIL/uL Final  . Hemoglobin 02/12/2018 12.7* 13.0 - 17.0 g/dL Final  . HCT 02/12/2018 40.0  39.0 - 52.0 % Final  . MCV 02/12/2018 97.1  80.0 - 100.0 fL Final  . MCH 02/12/2018 30.8  26.0 - 34.0 pg Final  . MCHC 02/12/2018 31.8  30.0 - 36.0 g/dL Final  . RDW 02/12/2018 13.3  11.5 - 15.5 % Final  . Platelets 02/12/2018 219  150 - 400 K/uL Final  . nRBC 02/12/2018 0.0  0.0 - 0.2 % Final  . Neutrophils Relative % 02/12/2018 75  % Final  . Neutro Abs 02/12/2018 6.8  1.7 - 7.7 K/uL Final  . Lymphocytes Relative 02/12/2018 13  % Final  . Lymphs Abs 02/12/2018 1.2  0.7 - 4.0 K/uL Final  . Monocytes Relative 02/12/2018 9  % Final  . Monocytes Absolute 02/12/2018 0.9  0.1 - 1.0 K/uL Final  . Eosinophils Relative 02/12/2018 2  % Final  . Eosinophils Absolute 02/12/2018 0.1  0.0 - 0.5 K/uL Final  . Basophils Relative 02/12/2018 1  % Final  . Basophils Absolute 02/12/2018 0.1  0.0 - 0.1 K/uL Final  . Immature Granulocytes 02/12/2018 0  % Final  . Abs Immature Granulocytes 02/12/2018 0.03  0.00 - 0.07 K/uL Final   Performed at [facility], [address] [place], [place] [address]  . Sodium 02/12/2018 137  135 - 145 mmol/L Final  . Potassium  02/12/2018 5.0  3.5 - 5.1 mmol/L Final  . Chloride 02/12/2018 107  98 - 111 mmol/L Final  . CO2 02/12/2018 23  22 - 32 mmol/L Final  . Glucose, Bld 02/12/2018 145* 70 - 99 mg/dL Final  . BUN 02/12/2018 27* 8 - 23 mg/dL Final  . Creatinine, Ser 02/12/2018 0.97  0.61  - 1.24 mg/dL Final  . Calcium 02/12/2018 9.2  8.9 - 10.3 mg/dL Final  . Total Protein 02/12/2018 7.0  6.5 - 8.1 g/dL Final  . Albumin 02/12/2018 3.4* 3.5 - 5.0 g/dL Final  . AST 02/12/2018 23  15 - 41 U/L Final  . ALT 02/12/2018 17  0 - 44 U/L Final  . Alkaline Phosphatase 02/12/2018 65  38 - 126 U/L Final  . Total Bilirubin 02/12/2018 0.6  0.3 - 1.2 mg/dL Final  . GFR calc non Af Amer 02/12/2018 >60  >60 mL/min Final  . GFR calc Af Amer 02/12/2018 >60  >60 mL/min Final  . Anion gap 02/12/2018 7  5 - 15 Final   Performed at [facility], [address], [place], [place] [address]  . LDH 02/12/2018 183  98 - 192 U/L Final   Performed at [facility], [address], [place], [place] [address]  . Ferritin 02/12/2018 30  24 - 336 ng/mL Final   Performed at [facility], [address], [place], [place] [address]     Pathology Orders Placed This Encounter  Procedures  . CBC with Differential/Platelet    Standing Status:   Future    Standing Expiration Date:   02/13/2019  . Comprehensive metabolic panel    Standing Status:   Future    Standing Expiration Date:   02/13/2019  . Lactate dehydrogenase    Standing Status:   Future    Standing Expiration Date:   02/13/2019  . Ferritin    Standing Status:   Future    Standing Expiration Date:   02/13/2019  . CEA    Standing Status:   Future    Standing Expiration Date:   02/13/2019       Zoila Shutter MD

## 2018-02-13 DIAGNOSIS — M4712 Other spondylosis with myelopathy, cervical region: Secondary | ICD-10-CM | POA: Diagnosis not present

## 2018-02-13 DIAGNOSIS — E1142 Type 2 diabetes mellitus with diabetic polyneuropathy: Secondary | ICD-10-CM | POA: Diagnosis not present

## 2018-02-13 DIAGNOSIS — C7801 Secondary malignant neoplasm of right lung: Secondary | ICD-10-CM | POA: Diagnosis not present

## 2018-02-13 DIAGNOSIS — C189 Malignant neoplasm of colon, unspecified: Secondary | ICD-10-CM | POA: Diagnosis not present

## 2018-02-13 DIAGNOSIS — J439 Emphysema, unspecified: Secondary | ICD-10-CM | POA: Diagnosis not present

## 2018-02-13 DIAGNOSIS — L304 Erythema intertrigo: Secondary | ICD-10-CM | POA: Diagnosis not present

## 2018-02-19 ENCOUNTER — Encounter (HOSPITAL_COMMUNITY): Payer: Medicare Other

## 2018-02-27 ENCOUNTER — Encounter (HOSPITAL_COMMUNITY): Payer: Self-pay | Admitting: *Deleted

## 2018-02-27 ENCOUNTER — Other Ambulatory Visit (HOSPITAL_COMMUNITY): Payer: Self-pay | Admitting: Internal Medicine

## 2018-02-27 MED ORDER — REGORAFENIB 40 MG PO TABS: 80.0000 mg | ORAL_TABLET | Freq: Every day | ORAL | 0 refills | Status: DC

## 2018-02-27 NOTE — Progress Notes (Signed)
At the request of Dr. Walden Field, I called patient and asked if he was willing to start new oral chemo that they had talked about. Patient said that he was willing to try it at a low dose but doesn't want to get bad like he did before.    I relayed the message to Dr. Walden Field.

## 2018-03-04 ENCOUNTER — Telehealth (HOSPITAL_COMMUNITY): Payer: Self-pay | Admitting: Pharmacist

## 2018-03-04 DIAGNOSIS — C189 Malignant neoplasm of colon, unspecified: Secondary | ICD-10-CM

## 2018-03-04 MED ORDER — REGORAFENIB 40 MG PO TABS: 80.0000 mg | ORAL_TABLET | Freq: Every day | ORAL | 0 refills | Status: DC

## 2018-03-04 NOTE — Telephone Encounter (Signed)
Oral Chemotherapy Pharmacist Encounter   Spoke with Dr. Walden Field about past therapies for Gary Nelson colon cancer. Reviewed typical IV therapy regimens used prior to Gary Nelson. Per Dr. Walden Field, Gary Nelson is declining further IV therapy due to past intolerance/toxicities. Stivarga PA submitted to insurance, will continue to follow status.    Darl Pikes, PharmD, BCPS, [facility] Hematology/Oncology Clinical Pharmacist [facility] [phone]  03/04/2018 3:52 PM

## 2018-03-04 NOTE — Telephone Encounter (Signed)
Oral Oncology Pharmacist Encounter   Received notification from [employer] that prior authorization for Stivarga is required.   PA submitted on CMM Key [id number] Status is pending   Oral Oncology Clinic will continue to follow.   Darl Pikes, PharmD, BCPS, [facility] Hematology/Oncology Clinical Pharmacist [facility] [place] [facility] [phone]  03/04/2018 3:47 PM

## 2018-03-04 NOTE — Telephone Encounter (Signed)
Oral Oncology Pharmacy Student Encounter  Received new prescription for Stivarga for the treatment of Stage IV adenocarcinoma of colon, planned duration until disease progression or unacceptable toxicity.   Gary Nelson is approved for the treatment of metastatic colorectal cancer in patients previously treated with fluoropyrimidine-, oxaliplatin-, and irinotecan-containing chemotherapy and anti-VEGF and if RAS wild-type, anti-EGFR therapy. 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 Will follow-up pending insurance determination of Stivarga coverage.   CBC from 02/12/18 assessed. Prescription dose and frequency assessed.   Current medication list in Epic reviewed, no DDIs with Stivarga identified.  Prescription has been e-scribed to the [facility] for benefits analysis and approval.  Oral Oncology Clinic will continue to follow for insurance authorization, copayment issues, initial counseling and start date.  [name], PharmD Candidate [facility] [place] [facility] [phone]  03/04/2018 10:07 AM

## 2018-03-05 NOTE — Telephone Encounter (Signed)
Oral Oncology Patient Advocate Encounter  Prior Authorization for Gary Nelson has been approved.    PA# [id number] Effective dates: 03/05/2018 through 02/06/2019  Patients co-pay is $3127.13  Oral Oncology Clinic will continue to follow.   [name] Patient [place] Phone [phone] Fax [phone] 03/05/2018 8:15 AM

## 2018-03-12 ENCOUNTER — Telehealth (HOSPITAL_COMMUNITY): Payer: Self-pay | Admitting: Pharmacy Technician

## 2018-03-12 NOTE — Telephone Encounter (Signed)
Oral Oncology Patient Advocate Encounter  Submitted application for [employer] [place] [employer] in an effort to reduce patient's out of pocket expense for Stivarga to $0.    10/13/7891- Application completed and faxed to [phone].   [employer] patient assistance phone number for follow up is [phone].   This encounter will be updated until final determination.  [place] Patient [place] Phone [phone] Fax [phone]

## 2018-03-12 NOTE — Telephone Encounter (Signed)
Oral Oncology Patient Advocate Encounter  Received notification from [employer] [place] Patient Assistance program that patient has been successfully enrolled into their program to receive Stivarga from the manufacturer at $0 out of pocket until 02/06/2019.   I called [employer] and they said the patient should received his first shipment by Thursday or Friday.   I called and spoke with patient.  Oral Oncology Clinic will continue to follow.  [place] Patient [place] Phone [phone] Fax [phone] 03/12/2018 3:05 PM

## 2018-03-13 NOTE — Telephone Encounter (Signed)
Spoke with patient and informed him that he had been approved for assistance.  I told him to expect a shipment Thursday or Friday and that the pharmacy may contact him before shipping to verify information.  He understood and was very appreciative that we could help him get his medicine.

## 2018-03-19 NOTE — Telephone Encounter (Signed)
Alyson called to speak with patient to make sure he received medication.  He had not received medication so she called [employer] to see what the issue was.  She conferenced Mr Kuhrt in on the call so he could set up delivery of medication.  He is scheduled to receive medication on 2/12.

## 2018-03-21 ENCOUNTER — Encounter (HOSPITAL_COMMUNITY): Payer: Self-pay | Admitting: *Deleted

## 2018-03-21 ENCOUNTER — Telehealth (HOSPITAL_COMMUNITY): Payer: Self-pay | Admitting: Pharmacist

## 2018-03-21 NOTE — Progress Notes (Signed)
I spoke with patient today and he received the Stivarga yesterday. He is going to start taking it today.  Patient advised to call us should he experience any issues.

## 2018-03-21 NOTE — Telephone Encounter (Signed)
Oral Chemotherapy Pharmacist Encounter  Patient Education I spoke with patient for overview of new oral chemotherapy medication: Stivarga (regorefenib) for the treatment of Stage IV adenocarcinoma of the colon, planned duration until disease progression or unacceptable drug toxicity.   Mr. Buist received his medication on 03/20/18 and took his first dose today 03/21/18. He is feeling fine so far.    Counseled patient on administration, dosing, side effects, monitoring, drug-food interactions, safe handling, storage, and disposal. Patient will take 2 tablets (80 mg total) by mouth daily. Take with low fat breakfast. Take for 21days, then hold for 7days.  Side effects include but not limited to: diarrhea, hand-foot syndrome, decreased wbc/hgb/plt, HTN.    Reviewed with patient importance of keeping a medication schedule and plan for any missed doses.  Mr. Mckeehan voiced understanding and appreciation. All questions answered. Medication handout and medication calendar placed in the mail.  Provided patient with [facility] [place] [facility] phone number. Patient knows to call the office with questions or concerns. Oral Chemotherapy Navigation Clinic will continue to follow.  Darl Pikes, PharmD, BCPS, [facility] Hematology/Oncology Clinical Pharmacist [facility] [place] [facility] [phone]  03/21/2018 4:11 PM

## 2018-03-26 DIAGNOSIS — B351 Tinea unguium: Secondary | ICD-10-CM | POA: Diagnosis not present

## 2018-03-26 DIAGNOSIS — E1142 Type 2 diabetes mellitus with diabetic polyneuropathy: Secondary | ICD-10-CM | POA: Diagnosis not present

## 2018-03-26 DIAGNOSIS — L84 Corns and callosities: Secondary | ICD-10-CM | POA: Diagnosis not present

## 2018-03-26 DIAGNOSIS — M79676 Pain in unspecified toe(s): Secondary | ICD-10-CM | POA: Diagnosis not present

## 2018-03-28 DIAGNOSIS — Z0000 Encounter for general adult medical examination without abnormal findings: Secondary | ICD-10-CM | POA: Diagnosis not present

## 2018-04-10 ENCOUNTER — Other Ambulatory Visit: Payer: Self-pay

## 2018-04-10 ENCOUNTER — Inpatient Hospital Stay (HOSPITAL_COMMUNITY): Payer: Medicare Other | Attending: Hematology

## 2018-04-10 ENCOUNTER — Inpatient Hospital Stay (HOSPITAL_COMMUNITY): Payer: Medicare Other

## 2018-04-10 ENCOUNTER — Encounter (HOSPITAL_COMMUNITY): Payer: Self-pay

## 2018-04-10 DIAGNOSIS — C7801 Secondary malignant neoplasm of right lung: Secondary | ICD-10-CM | POA: Insufficient documentation

## 2018-04-10 DIAGNOSIS — C19 Malignant neoplasm of rectosigmoid junction: Secondary | ICD-10-CM | POA: Insufficient documentation

## 2018-04-10 DIAGNOSIS — C189 Malignant neoplasm of colon, unspecified: Secondary | ICD-10-CM

## 2018-04-10 DIAGNOSIS — I10 Essential (primary) hypertension: Secondary | ICD-10-CM | POA: Insufficient documentation

## 2018-04-10 LAB — CBC WITH DIFFERENTIAL/PLATELET
Abs Immature Granulocytes: 0.03 10*3/uL (ref 0.00–0.07)
Basophils Absolute: 0.1 10*3/uL (ref 0.0–0.1)
Basophils Relative: 1 %
Eosinophils Absolute: 0.4 10*3/uL (ref 0.0–0.5)
Eosinophils Relative: 4 %
HCT: 38.7 % — ABNORMAL LOW (ref 39.0–52.0)
Hemoglobin: 12.3 g/dL — ABNORMAL LOW (ref 13.0–17.0)
Immature Granulocytes: 0 %
Lymphocytes Relative: 15 %
Lymphs Abs: 1.4 10*3/uL (ref 0.7–4.0)
MCH: 29.9 pg (ref 26.0–34.0)
MCHC: 31.8 g/dL (ref 30.0–36.0)
MCV: 93.9 fL (ref 80.0–100.0)
Monocytes Absolute: 0.9 10*3/uL (ref 0.1–1.0)
Monocytes Relative: 10 %
Neutro Abs: 6.3 10*3/uL (ref 1.7–7.7)
Neutrophils Relative %: 70 %
Platelets: 186 10*3/uL (ref 150–400)
RBC: 4.12 MIL/uL — ABNORMAL LOW (ref 4.22–5.81)
RDW: 14.5 % (ref 11.5–15.5)
WBC: 9.1 10*3/uL (ref 4.0–10.5)
nRBC: 0 % (ref 0.0–0.2)

## 2018-04-10 LAB — COMPREHENSIVE METABOLIC PANEL
ALT: 27 U/L (ref 0–44)
AST: 28 U/L (ref 15–41)
Albumin: 3.2 g/dL — ABNORMAL LOW (ref 3.5–5.0)
Alkaline Phosphatase: 89 U/L (ref 38–126)
Anion gap: 5 (ref 5–15)
BUN: 21 mg/dL (ref 8–23)
CO2: 23 mmol/L (ref 22–32)
Calcium: 8.1 mg/dL — ABNORMAL LOW (ref 8.9–10.3)
Chloride: 109 mmol/L (ref 98–111)
Creatinine, Ser: 1.03 mg/dL (ref 0.61–1.24)
GFR calc Af Amer: >60 mL/min (ref >60)
GFR calc non Af Amer: >60 mL/min (ref >60)
Glucose, Bld: 128 mg/dL — ABNORMAL HIGH (ref 70–99)
Potassium: 4.7 mmol/L (ref 3.5–5.1)
Sodium: 137 mmol/L (ref 135–145)
Total Bilirubin: 0.9 mg/dL (ref 0.3–1.2)
Total Protein: 6.3 g/dL — ABNORMAL LOW (ref 6.5–8.1)

## 2018-04-10 LAB — LACTATE DEHYDROGENASE: LDH: 233 U/L — ABNORMAL HIGH (ref 98–192)

## 2018-04-10 LAB — FERRITIN: Ferritin: 67 ng/mL (ref 24–336)

## 2018-04-10 MED ORDER — HEPARIN SOD (PORK) LOCK FLUSH 100 UNIT/ML IV SOLN
500.0000 [IU] | Freq: Once | INTRAVENOUS | Status: AC
  Administered 2018-04-10: 500 [IU] via INTRAVENOUS

## 2018-04-10 MED ORDER — SODIUM CHLORIDE 0.9% FLUSH
10.0000 mL | Freq: Once | INTRAVENOUS | Status: AC
  Administered 2018-04-10: 10 mL via INTRAVENOUS

## 2018-04-10 NOTE — Progress Notes (Signed)
Gary Nelson presented for Portacath access and flush. Portacath located right chest wall accessed with  H 20 needle. No blood return and no resistance met when flushing.  Portacath flushed with 50m NS and 500U/524mHeparin and needle removed intact. Procedure without incident. Patient tolerated procedure well. Vitals stable and discharged home from clinic ambulatory. Follow up as scheduled.   Patient is taking stargiva and has not missed any doses and reports no side effects at this time.

## 2018-04-10 NOTE — Patient Instructions (Signed)
[facility]  Discharge Instructions:   _______________________________________________________________  Thank you for choosing [facility] to provide your oncology and hematology care.  To afford each patient quality time with our providers, please arrive at least 15 minutes before your scheduled appointment.  You need to re-schedule your appointment if you arrive 10 or more minutes late.  We strive to give you quality time with our providers, and arriving late affects you and other patients whose appointments are after yours.  Also, if you no show three or more times for appointments you may be dismissed from the clinic.  Again, thank you for choosing  [facility]. Our hope is that these requests will allow you access to exceptional care and in a timely manner. _______________________________________________________________  If you have questions after your visit, please contact our office at [phone] between the hours of 8:30 a.m. and 5:00 p.m. Voicemails left after 4:30 p.m. will not be returned until the following business day. _______________________________________________________________  For prescription refill requests, have your pharmacy contact our office. _______________________________________________________________  Recommendations made by the consultant and any test results will be sent to your referring physician. _______________________________________________________________ 

## 2018-04-11 LAB — CEA: CEA: 4.4 ng/mL (ref 0.0–4.7)

## 2018-04-16 ENCOUNTER — Other Ambulatory Visit: Payer: Self-pay

## 2018-04-16 ENCOUNTER — Inpatient Hospital Stay (HOSPITAL_BASED_OUTPATIENT_CLINIC_OR_DEPARTMENT_OTHER): Payer: Medicare Other | Admitting: Internal Medicine

## 2018-04-16 VITALS — BP 143/73 | HR 101 | Temp 97.5°F | Resp 18 | Wt 257.2 lb

## 2018-04-16 DIAGNOSIS — C7801 Secondary malignant neoplasm of right lung: Secondary | ICD-10-CM | POA: Diagnosis not present

## 2018-04-16 DIAGNOSIS — C189 Malignant neoplasm of colon, unspecified: Secondary | ICD-10-CM

## 2018-04-16 DIAGNOSIS — C19 Malignant neoplasm of rectosigmoid junction: Secondary | ICD-10-CM | POA: Diagnosis not present

## 2018-04-16 DIAGNOSIS — I10 Essential (primary) hypertension: Secondary | ICD-10-CM | POA: Diagnosis not present

## 2018-04-16 NOTE — Progress Notes (Signed)
Diagnosis Adenocarcinoma of colon ([place]) - Plan: CBC with Differential, Comprehensive metabolic panel, Lactate dehydrogenase  Staging Cancer Staging [facility]) Staging form: Colon and Rectum, AJCC 7th Edition - Clinical stage from 10/04/2012: Stage IIA (T3, N0, M0) - Signed by Baird Cancer, PA-C on 07/15/2015 - Pathologic stage from 09/17/2015: Stage IVA (T3, N0, M1a) - Signed by Baird Cancer, PA-C on 10/05/2015   Assessment and Plan:  1.  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 [name] followed by 6 months worth of adjuvant chemotherapy consisting of FOLFOX with a change in treatment to infusional 5-FU for progressive peripheral neuropathy to finish 6 months worth of treatment by Dr. [name].  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 [name].  Pt was last treated with SBRT of right lung from 01/02/2017 to 01/12/2017.    CAP done 05/29/2017 showed  IMPRESSION: 1. Area of presumed post radiation change in the medial posterior right lower lobe measures minimally larger today although there is some adjacent atelectasis on today's exam and a new tiny right pleural effusion. Continued close attention on follow-up recommended. 2. No new or progressive findings in the abdomen/pelvis to suggest metastatic disease. 3. 11 mm hyperattenuating posterior right liver lesion unchanged. 4. Previously identified right omental nodule is stable. 5.  Aortic Atherosclerois (ICD10-170.0) 6.  Emphysema. (WYO37-C58.9)  PET scan that was done 08/27/2017  showed  IMPRESSION: 1. Focal hypermetabolism (max SUV 5.3) within masslike focus of consolidation in the medial basilar right lower lobe, suspicious  for residual/recurrent tumor. 2. No additional hypermetabolic sites of metastatic disease. 3. New tiny 3 mm solid anterior right upper lobe pulmonary nodule, not definitely seen on prior chest CT, below PET resolution. Recommend attention on follow-up chest CT in 3 months. 4. Trace dependent right pleural effusion. 5.  Aortic Atherosclerosis (ICD10-I70.0).  I discussed with him that due to known evidence of metastatic involvement of the lung from colorectal cancer he is recommended for systemic therapy.  Pt was treated with Xeloda dosed at 1000 mg/m twice a day for 14 days with 1 week off.  Based on results of the Avex  trial, I have spoken with him regarding addition of Avastin dosed at 7.5 mg/kg every 21 days with Xeloda.  He initially tolerated Xeloda and denied significant diarrhea.    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  He has negative NRAS, KRAS and HRAS.    Pt has been in nursing facility due to extensive rash on bottom area.  He is now being followed by home health.  I have discussed with him treatment will remain on hold for now and he was set up for repeat imaging with PET scan that was done 01/15/2018 that was reviewed and showed  IMPRESSION:   1. Similar size and metabolic activity RIGHT lower lobe nodule concerning for residual carcinoma. 2. No evidence of new metastatic disease in the thorax. 3. No evidence of local recurrence or metastasis in the abdomen pelvis.  Pt has stable findings on recent imaging . He had option to defer therapy to allow ongoing recovery.  Previously, I discussed options of Stivarga with low dose of 80 mg to begin with to assess for tolerance of therapy based on results of Correct study that showed increase in  MS and PFS with Stivarga in pts who had been treated with several lines of prior therapy.  Side effects of the medication reviewed with the patient and he was provided written information.    Pt has begun Stivarga.  He is tolerating  therapy.  Labs done 04/10/2018 reviewed and WNL.  Ferritin 67 and CEA 4.4.  Pt will continue stivarga at present dose and follow-up in 6 weeks with repeat labs.  Anticipate repeat imaging in 07/2018.    2.  Right lung lesion.  Pt had CT of chest done 05/29/2017 that showed Medial right lower lobe pleural-based density is similar to prior measuring 3.5 cm long axis today compared to 2.8 previously. No new pulmonary nodule or mass. Tiny right pleural effusion is new in the interval.  PET scan  that was done 08/27/2017 showed  IMPRESSION: 1. Focal hypermetabolism (max SUV 5.3) within masslike focus of consolidation in the medial basilar right lower lobe, suspicious for residual/recurrent tumor. 2. No additional hypermetabolic sites of metastatic disease. 3. New tiny 3 mm solid anterior right upper lobe pulmonary nodule, not definitely seen on prior chest CT, below PET resolution. Recommend attention on follow-up chest CT in 3 months  in 08/2017 for short interval evaluation.    Case was reviewed at tumor conference and pt is not a candidate for additional RT.  He is recommended for systemic therapy.  Pt was started on Xeloda and Avastin which is now on hold due to skin breakdown.  PET scan done 01/2018 shows stable lung findings.   Pt has begun Stivarga.  He will have CT chest done 07/2018 for ongoing follow-up.    3.  Skin breakdown.  Pt was followed by home health.  Area is healed.   Pt should notify the office if any change in skin occurs.    4.  PAC.  Continue port flushes as directed.    5.  HTN.  BP is 143/73.    Follow-up with PCP.    Interval  history: Historical data obtained from the note dated 06/05/2017.  78 y.o. 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 [name] followed by 6 months worth of adjuvant chemotherapy consisting of FOLFOX with a change in treatment to infusional 5-FU for progressive peripheral neuropathy to finish 6 months worth of treatment by Dr. [name].  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 [name].  Current Status:  Pt is seen today for follow-up.  He is here to go over labs.  He is accompanied by family member.  He is tolerating Stivarga.  He reports no skin breakdown.      Adenocarcinoma of colon ([place])   10/04/2012 Definitive Surgery    Partial collectomy by Dr. [name]    10/04/2012 Pathology Results    4.5 cm poorly differentiated adenocarcinoma, negative margins, 0/7 lymph nodes for metastatic disease.  Oncotype recurrence score of 26 (high)     Chemotherapy    FOLFOX     Adverse Reaction    Progressive peripheral neuropathy     Chemotherapy    Infusional 5 FU.  Completed 6 months worth of adjuvant therapy.    02/23/2014 Procedure    Colonoscopy by Dr. Anthony Sar.    07/01/2015 PET scan    There is malignant  range uptake with RLL pulmonary nodule suspicious for metastatic disease or primary pulm neoplasm. Nonspecific focus of uptake in the L femoral neck. No corresponding CT abnormality. Cannot rule out metastasis.    07/06/2015 Imaging    MR C-spine- C5-6 there is a broad-based disc bulge w R paracentral disc protrusion deforming the spinal cord. Moderate R foraminal stenosis. C6-7 there is a central disc protrusion slightly eccentric towards the R and mildly deforming the ventral c-spine     09/16/2015 Procedure    RLL needle biopsy by IR.    09/17/2015 Pathology Results    Metastatic adenocarcinoma consistent with a primary colorectal adenocarcinoma.    10/12/2015 - 10/22/2015 Radiation Therapy    SBRT by Dr. [name] to RLL pulmonary lesion (biopsy proven to be oligometastasis).    12/09/2015 Imaging    CT chest- 1. Slight interval decrease in size of the  right lower lobe pulmonary nodule. Some surrounding radiation changes. 2. No mediastinal or hilar mass or adenopathy. 3. No new pulmonary nodules. 4. Stable underline emphysematous changes. 5. Cirrhotic changes involving the liver but no upper abdominal metastatic disease.    03/14/2016 Imaging    CT chest- 1. Continued mild reduction in the size of the treated right lobe pulmonary nodule. 2. Increased patchy consolidation, reticulation and ground-glass attenuation in the basilar right lower lobe, nonspecific, favor evolving postradiation change, recommend attention on follow-up chest CT. 3. No evidence of new or progressive metastatic disease in the chest. 4. Aortic atherosclerosis. Left main and 3 vessel coronary atherosclerosis. 5. Mild emphysema.    06/30/2016 Imaging    CT CAP- 1. The appearance of the treated right lower lobe nodule is essentially unchanged, and there are evolving postradiation changes in the base of the right lower lobe, as above. No definite signs of new metastatic disease are noted elsewhere in the chest, abdomen or pelvis. 2. Aortic atherosclerosis, in addition to left main and 3 vessel coronary artery disease. Assessment for potential risk factor modification, dietary therapy or pharmacologic therapy may be warranted, if clinically indicated. 3. Morphologic changes in the liver suggestive of cirrhosis, as above. 4. Additional incidental findings, similar prior studies, as above.    11/17/2016 Imaging    CT CAP Study Result   CLINICAL DATA:  Stage IV colon cancer originally diagnosed in August 2014 status post partial colectomy, with right lower lobe lung metastasis diagnosed August 2017 status post SBRT completed 10/22/2015. Restaging.  EXAM: CT CHEST, ABDOMEN, AND PELVIS WITH CONTRAST  TECHNIQUE: Multidetector CT imaging of the chest, abdomen and pelvis was performed following the standard protocol during bolus administration of intravenous  contrast.  CONTRAST:  160m ISOVUE-300 IOPAMIDOL (ISOVUE-300) INJECTION 61%  COMPARISON:  06/30/2016 CT chest, abdomen and pelvis.  FINDINGS: CT CHEST FINDINGS  Cardiovascular: Normal heart size. No significant pericardial fluid/thickening. Left main, left anterior descending, left circumflex and right coronary atherosclerosis. Atherosclerotic nonaneurysmal thoracic aorta. Normal caliber pulmonary arteries. No central pulmonary emboli. Right internal jugular MediPort terminates at the junction of the right and left brachiocephalic veins.  Mediastinum/Nodes: No discrete thyroid nodules. Unremarkable esophagus. No pathologically enlarged axillary, mediastinal or hilar lymph nodes.  Lungs/Pleura: No pneumothorax. No pleural effusion. There is a nodular 2.7 x 1.6 cm focus of consolidation in the medial basilar right lower lobe (series 3/ image 108), previously 1.6 x 1.1 cm. No appreciable change in patchy subpleural reticulation in both lungs without significant traction bronchiectasis or frank honeycombing. Mild centrilobular and paraseptal emphysema with mild diffuse bronchial wall thickening. No  acute consolidative airspace disease or new significant pulmonary nodules.  Musculoskeletal: No aggressive appearing focal osseous lesions. Moderate thoracic spondylosis. Stable chronic left scapular deformity.  CT ABDOMEN PELVIS FINDINGS  Hepatobiliary: Normal liver with no liver mass. Cholecystectomy. No biliary ductal dilatation.  Pancreas: Normal, with no mass or duct dilation.  Spleen: Normal size. No mass.  Adrenals/Urinary Tract: Normal adrenals. No hydronephrosis. No renal masses. Normal bladder.  Stomach/Bowel: Grossly normal stomach. Normal caliber small bowel with no small bowel wall thickening. Stable appearance of the appendix, within normal limits. Stable postsurgical changes from partial distal colectomy with intact appearing distal  colonic anastomosis. Oral contrast transits to the cecum. No large bowel wall thickening or new pericholecystic fat stranding .  Vascular/Lymphatic: Atherosclerotic nonaneurysmal abdominal aorta. Patent portal, splenic, hepatic and renal veins. No pathologically enlarged lymph nodes in the abdomen or pelvis.  Reproductive:  Normal size prostate.  Other: No pneumoperitoneum, ascites or focal fluid collection. Stable subcutaneous calcified granulomas throughout the left lower back and gluteal regions.  Musculoskeletal: No aggressive appearing focal osseous lesions. Marked lumbar spondylosis.  IMPRESSION: 1. Nodular focus of consolidation in the medial basilar right lower lobe is increased in size. While this may represent evolving masslike radiation fibrosis, recurrence of the lung metastasis is not excluded. PET-CT would be useful for further evaluation. Otherwise, continued close chest CT surveillance is advised . 2. No additional potential new sites of metastatic disease in the chest, abdomen or pelvis. 3. No evidence of local tumor recurrence at the distal colonic anastomosis. 4. Left main and 3 vessel coronary atherosclerosis . 5. Aortic Atherosclerosis (ICD10-I70.0) and Emphysema (ICD10-J43.9).       11/30/2016 Relapse/Recurrence    PET: IMPRESSION: 1. Enlarging right lower lobe pulmonary nodule and slight increase in the SUV max suggesting residual neoplasm. No new pulmonary lesions. 2. No findings for local recurrence or abdominal/pelvic metastatic disease.    01/02/2017 - 01/12/2017 Radiation Therapy     The patient saw Dr. [name] for SBRT radiation treatment. The tumor in the right lung was treated with a course of stereotactic body radiation treatment. The patient received 50 Gy In 5 fractions at 10 Gy per fraction.       10/05/2017 -  Chemotherapy    The patient had bevacizumab (AVASTIN) 900 mg in sodium chloride 0.9 % 100 mL chemo infusion, 7.3 mg/kg =  925 mg, Intravenous,  Once, 1 of 3 cycles Administration: 900 mg (10/05/2017)  for chemotherapy treatment.       Problem List Patient Active Problem List   Diagnosis Date Noted  . Lung metastasis ([place]) [C78.00] 10/20/2015  . 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 Adenocarcinoma of colon ([place]) [C18.9] 07/15/2015  . Sepsis ([place]) [A41.9] 03/19/2013  . CHF, chronic ([place]) [I50.9] 03/19/2013  . Diarrhea [R19.7] 02/19/2013  . Dvt femoral (deep venous thrombosis) ([place]) [I82.419] 02/19/2013  . HTN (hypertension) [I10] 09/09/2012  . Diabetes mellitus ([place]) [E11.9] 09/09/2012  . Morbid obesity ([place]) [E66.01] 09/09/2012  . Sleep apnea [G47.30] 09/09/2012  . CKD (chronic kidney disease) [N18.9] 09/09/2012    Past Medical History Past Medical History:  Diagnosis Date  . Adenocarcinoma of colon ([place])    RLL -  Pulmonary nodule (06/2015, concerning for mets vs primary; to see RAD-ONC Dr. [name]); stage 2 adenocarinoma   2014 - Colon Cancer - surgery and chemo  . Blood infection ([place])   . Cystitis   . 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 DVT (deep  venous thrombosis) ([place]) 2014   post op  . Hematuria   . Hypertension   . Neuropathy   . Prostatitis   . Pulmonary nodule   . Sleep apnea     Past Surgical History Past Surgical History:  Procedure Laterality Date  . BACK SURGERY    . CATARACT EXTRACTION     left  . CATARACT EXTRACTION W/PHACO Right 04/11/2012   Procedure: CATARACT EXTRACTION PHACO AND INTRAOCULAR LENS PLACEMENT (IOC);  Surgeon: Tonny Branch, MD;  Location: [facility];  Service: Ophthalmology;  Laterality: Right;  CDE:62.48  . CERVICAL FUSION     c4-c5  . CHOLECYSTECTOMY    . COLON SURGERY  09/29/12   Colon resection for cancer  . COLONOSCOPY    . EYE SURGERY     cataract  . GIVENS CAPSULE STUDY N/A 03/15/2015   Procedure: GIVENS CAPSULE STUDY;  Surgeon: Rogene Houston, MD;  Location: [facility]ITE;  Service: Endoscopy;  Laterality:  N/A;  730  . LEG SURGERY    . ORIF FEMUR FRACTURE     right  . POSTERIOR CERVICAL FUSION/FORAMINOTOMY N/A 07/27/2015   Procedure: Cervical four to Cervical seven Laminectomy with Cervical four to Thoracic one Dorsal internal fixation and fusion;  Surgeon: Kevan Ny Ditty, MD;  Location: [facility];  Service: Neurosurgery;  Laterality: N/A;  C4 to C7 Laminectomy with C4 to T1 Dorsal internal fixation and fusion    Family History Family History  Problem Relation Age of Onset  . Heart disease Father   . Heart attack Father   . Alzheimer's disease Mother      Social History  reports that he has quit smoking. His smoking use included cigarettes. He started smoking about 63 years ago. He has a 55.00 pack-year smoking history. He has never used smokeless tobacco. He reports previous alcohol use of about 3.0 standard drinks of alcohol per week. He reports that he does not use drugs.  Medications  Current Outpatient Medications:  .  aspirin EC 81 MG tablet, Take 81 mg by mouth daily., Disp: , Rfl:  .  atorvastatin (LIPITOR) 10 MG tablet, Take 10 mg by mouth daily at 6 PM. , Disp: , Rfl:  .  furosemide (LASIX) 20 MG tablet, Take 20 mg by mouth every other day. , Disp: , Rfl:  .  gabapentin (NEURONTIN) 300 MG capsule, Take 2 capsules (600 mg total) by mouth 3 (three) times daily. (Patient taking differently: Take 600 mg by mouth 2 (two) times daily. ), Disp: 180 capsule, Rfl: 2 .  HYDROcodone-acetaminophen (NORCO/VICODIN) 5-325 MG tablet, , Disp: , Rfl:  .  insulin NPH (HUMULIN N,NOVOLIN N) 100 UNIT/ML injection, Inject 15-30 Units into the skin See admin instructions. 25 units every morning and 14 units in the evening, Disp: , Rfl:  .  lisinopril (PRINIVIL,ZESTRIL) 40 MG tablet, Take 40 mg by mouth daily., Disp: , Rfl:  .  naproxen (NAPROSYN) 500 MG tablet, TAKE 1 TABLET BY MOUTH TWICE A DAY WITH MEALS, Disp: , Rfl:  .  nystatin-triamcinolone (MYCOLOG II) cream, Apply topically., Disp: ,  Rfl:  .  OXYGEN, Inhale 2 L into the lungs continuous. At night time only, Disp: , Rfl:  .  regorafenib (STIVARGA) 40 MG tablet, Take 2 tablets (80 mg total) by mouth daily. Take with low fat breakfast. Take for 21days, then hold for 7days. Take as directed., Disp: 56 tablet, Rfl: 0 .  silver sulfADIAZINE (SILVADENE) 1 % cream, , Disp: , Rfl:  Allergies Patient has no known allergies.  Review of Systems Review of Systems - Oncology ROS negative   Physical Exam  Vitals Wt Readings from Last 3 Encounters:  04/16/18 257 lb 3.2 oz (116.7 kg)  02/12/18 261 lb (118.4 kg)  10/19/17 221 lb (100.2 kg)   Temp Readings from Last 3 Encounters:  04/16/18 (!) 97.5 F (36.4 C) (Oral)  04/10/18 97.6 F (36.4 C) (Oral)  02/12/18 97.7 F (36.5 C) (Oral)   BP Readings from Last 3 Encounters:  04/16/18 (!) 143/73  04/10/18 (!) 130/50  02/12/18 (!) 123/54   Pulse Readings from Last 3 Encounters:  04/16/18 (!) 101  04/10/18 89  02/12/18 (!) 112    Constitutional: Well-developed, well-nourished, and in no distress.  Seated in wheelchair.   HENT: Head: Normocephalic and atraumatic.  Mouth/Throat: No oropharyngeal exudate. Mucosa moist. Eyes: Pupils are equal, round, and reactive to light. Conjunctivae are normal. No scleral icterus.  Neck: Normal range of motion. Neck supple. No JVD present.  Cardiovascular: Normal rate, regular rhythm and normal heart sounds.  Exam reveals no gallop and no friction rub.   No murmur heard. Pulmonary/Chest: Effort normal and breath sounds normal. No respiratory distress. No wheezes.No rales.  Abdominal: Soft. Bowel sounds are normal. No distension. There is no tenderness. There is no guarding.  Musculoskeletal: No edema or tenderness.  Lymphadenopathy: No cervical, axillary or supraclavicular adenopathy.  Neurological: Alert and oriented to person, place, and time. No cranial nerve deficit.  Skin: Skin is warm and dry. No rash noted. No erythema. No  pallor.  Psychiatric: Affect and judgment normal.   Labs No visits with results within 3 Day(s) from this visit.  Latest known visit with results is:  Appointment on 04/10/2018  Component Date Value Ref Range Status  . WBC 04/10/2018 9.1  4.0 - 10.5 K/uL Final  . RBC 04/10/2018 4.12* 4.22 - 5.81 MIL/uL Final  . Hemoglobin 04/10/2018 12.3* 13.0 - 17.0 g/dL Final  . HCT 04/10/2018 38.7* 39.0 - 52.0 % Final  . MCV 04/10/2018 93.9  80.0 - 100.0 fL Final  . MCH 04/10/2018 29.9  26.0 - 34.0 pg Final  . MCHC 04/10/2018 31.8  30.0 - 36.0 g/dL Final  . RDW 04/10/2018 14.5  11.5 - 15.5 % Final  . Platelets 04/10/2018 186  150 - 400 K/uL Final  . nRBC 04/10/2018 0.0  0.0 - 0.2 % Final  . Neutrophils Relative % 04/10/2018 70  % Final  . Neutro Abs 04/10/2018 6.3  1.7 - 7.7 K/uL Final  . Lymphocytes Relative 04/10/2018 15  % Final  . Lymphs Abs 04/10/2018 1.4  0.7 - 4.0 K/uL Final  . Monocytes Relative 04/10/2018 10  % Final  . Monocytes Absolute 04/10/2018 0.9  0.1 - 1.0 K/uL Final  . Eosinophils Relative 04/10/2018 4  % Final  . Eosinophils Absolute 04/10/2018 0.4  0.0 - 0.5 K/uL Final  . Basophils Relative 04/10/2018 1  % Final  . Basophils Absolute 04/10/2018 0.1  0.0 - 0.1 K/uL Final  . Immature Granulocytes 04/10/2018 0  % Final  . Abs Immature Granulocytes 04/10/2018 0.03  0.00 - 0.07 K/uL Final   Performed at [facility], [address], [place], [place] [address]  . Sodium 04/10/2018 137  135 - 145 mmol/L Final  . Potassium 04/10/2018 4.7  3.5 - 5.1 mmol/L Final  . Chloride 04/10/2018 109  98 - 111 mmol/L Final  . CO2 04/10/2018 23  22 - 32 mmol/L Final  .  Glucose, Bld 04/10/2018 128* 70 - 99 mg/dL Final  . BUN 04/10/2018 21  8 - 23 mg/dL Final  . Creatinine, Ser 04/10/2018 1.03  0.61 - 1.24 mg/dL Final  . Calcium 04/10/2018 8.1* 8.9 - 10.3 mg/dL Final  . Total Protein 04/10/2018 6.3* 6.5 - 8.1 g/dL Final  . Albumin 04/10/2018 3.2* 3.5 - 5.0 g/dL Final  . AST 04/10/2018 28  15 -  41 U/L Final  . ALT 04/10/2018 27  0 - 44 U/L Final  . Alkaline Phosphatase 04/10/2018 89  38 - 126 U/L Final  . Total Bilirubin 04/10/2018 0.9  0.3 - 1.2 mg/dL Final  . GFR calc non Af Amer 04/10/2018 >60  >60 mL/min Final  . GFR calc Af Amer 04/10/2018 >60  >60 mL/min Final  . Anion gap 04/10/2018 5  5 - 15 Final   Performed at [facility], [address], [place], [place] [address]  . LDH 04/10/2018 233* 98 - 192 U/L Final   Performed at [facility], [address] [place], [place] [address]  . Ferritin 04/10/2018 67  24 - 336 ng/mL Final   Performed at [facility], [address], [place], [place] [address]  . CEA 04/10/2018 4.4  0.0 - 4.7 ng/mL Final   Comment: (NOTE)                             Nonsmokers          <3.9                             Smokers             <5.6 [employer] Electrochemiluminescence Immunoassay (ECLIA) Values obtained with different assay methods or kits cannot be used interchangeably.  Results cannot be interpreted as absolute evidence of the presence or absence of malignant disease. Performed At: [facility] [place], [place] [id number] Rush Farmer MD YO:[id number]      Pathology Orders Placed This Encounter  Procedures  . CBC with Differential    Standing Status:   Future    Standing Expiration Date:   04/16/2019  . Comprehensive metabolic panel    Standing Status:   Future    Standing Expiration Date:   04/16/2019  . Lactate dehydrogenase    Standing Status:   Future    Standing Expiration Date:   04/16/2019       Zoila Shutter MD

## 2018-04-17 ENCOUNTER — Other Ambulatory Visit (HOSPITAL_COMMUNITY): Payer: Self-pay | Admitting: Pharmacist

## 2018-04-17 DIAGNOSIS — C189 Malignant neoplasm of colon, unspecified: Secondary | ICD-10-CM

## 2018-04-17 MED ORDER — REGORAFENIB 40 MG PO TABS: 80.0000 mg | ORAL_TABLET | Freq: Every day | ORAL | 0 refills | Status: DC

## 2018-04-23 ENCOUNTER — Telehealth (HOSPITAL_COMMUNITY): Payer: Self-pay | Admitting: Pharmacist

## 2018-04-23 NOTE — Telephone Encounter (Signed)
Oral Chemotherapy Pharmacist Encounter   Mr. Gladwin left me a VM stating he needed a refill for his Stivarga. A refill prescription for his Stivarga was sent in on 04/17/2018. I called [employer] patient assistance program REACH to inquire about the refill. REACH needed to speak with Mr. Hinojos to complete refill. I attempted to conference Mr. Milnes into the phone call but the call went to VM. I called Mr. Purdy a little later and he answered. Provided him with the telephone number for the REACH program and the options numbers to select to get to the refill department (tele: [phone], Option 2 then 4). Told Mr. Lewter to give me a call if her has any issues with obtaining the refill.  Darl Pikes, PharmD, BCPS, [facility] Hematology/Oncology Clinical Pharmacist [facility] [phone]  04/23/2018 12:56 PM

## 2018-04-24 ENCOUNTER — Other Ambulatory Visit (HOSPITAL_COMMUNITY): Payer: Self-pay | Admitting: *Deleted

## 2018-04-24 NOTE — Progress Notes (Signed)
Pharmacy called to clarify patient's prescription. I called [facility] to clarify based on Dr. Walden Field last office note and the prescription she sent in on 3/11.  Pharmacy stated that they will expedite him the medication tomorrow. I have notified patient and he verbalizes understanding.

## 2018-05-22 ENCOUNTER — Other Ambulatory Visit (HOSPITAL_COMMUNITY): Payer: Self-pay | Admitting: *Deleted

## 2018-05-22 DIAGNOSIS — C7801 Secondary malignant neoplasm of right lung: Secondary | ICD-10-CM

## 2018-05-22 DIAGNOSIS — C189 Malignant neoplasm of colon, unspecified: Secondary | ICD-10-CM

## 2018-05-23 ENCOUNTER — Inpatient Hospital Stay (HOSPITAL_COMMUNITY): Payer: Medicare Other | Attending: Hematology

## 2018-05-23 ENCOUNTER — Other Ambulatory Visit: Payer: Self-pay

## 2018-05-23 DIAGNOSIS — C189 Malignant neoplasm of colon, unspecified: Secondary | ICD-10-CM

## 2018-05-23 DIAGNOSIS — C7801 Secondary malignant neoplasm of right lung: Secondary | ICD-10-CM | POA: Diagnosis not present

## 2018-05-23 DIAGNOSIS — Z9221 Personal history of antineoplastic chemotherapy: Secondary | ICD-10-CM | POA: Insufficient documentation

## 2018-05-23 LAB — CBC WITH DIFFERENTIAL/PLATELET
Abs Immature Granulocytes: 0.02 10*3/uL (ref 0.00–0.07)
Basophils Absolute: 0.1 10*3/uL (ref 0.0–0.1)
Basophils Relative: 1 %
Eosinophils Absolute: 0.3 10*3/uL (ref 0.0–0.5)
Eosinophils Relative: 4 %
HCT: 37.6 % — ABNORMAL LOW (ref 39.0–52.0)
Hemoglobin: 12.3 g/dL — ABNORMAL LOW (ref 13.0–17.0)
Immature Granulocytes: 0 %
Lymphocytes Relative: 15 %
Lymphs Abs: 1.3 10*3/uL (ref 0.7–4.0)
MCH: 31.8 pg (ref 26.0–34.0)
MCHC: 32.7 g/dL (ref 30.0–36.0)
MCV: 97.2 fL (ref 80.0–100.0)
Monocytes Absolute: 0.9 10*3/uL (ref 0.1–1.0)
Monocytes Relative: 11 %
Neutro Abs: 5.9 10*3/uL (ref 1.7–7.7)
Neutrophils Relative %: 69 %
Platelets: 188 10*3/uL (ref 150–400)
RBC: 3.87 MIL/uL — ABNORMAL LOW (ref 4.22–5.81)
RDW: 14.7 % (ref 11.5–15.5)
WBC: 8.4 10*3/uL (ref 4.0–10.5)
nRBC: 0 % (ref 0.0–0.2)

## 2018-05-23 LAB — COMPREHENSIVE METABOLIC PANEL
ALT: 24 U/L (ref 0–44)
AST: 28 U/L (ref 15–41)
Albumin: 3.5 g/dL (ref 3.5–5.0)
Alkaline Phosphatase: 74 U/L (ref 38–126)
Anion gap: 7 (ref 5–15)
BUN: 18 mg/dL (ref 8–23)
CO2: 24 mmol/L (ref 22–32)
Calcium: 8.5 mg/dL — ABNORMAL LOW (ref 8.9–10.3)
Chloride: 108 mmol/L (ref 98–111)
Creatinine, Ser: 0.88 mg/dL (ref 0.61–1.24)
GFR calc Af Amer: >60 mL/min (ref >60)
GFR calc non Af Amer: >60 mL/min (ref >60)
Glucose, Bld: 105 mg/dL — ABNORMAL HIGH (ref 70–99)
Potassium: 4.5 mmol/L (ref 3.5–5.1)
Sodium: 139 mmol/L (ref 135–145)
Total Bilirubin: 0.9 mg/dL (ref 0.3–1.2)
Total Protein: 6.6 g/dL (ref 6.5–8.1)

## 2018-05-23 LAB — LACTATE DEHYDROGENASE: LDH: 210 U/L — ABNORMAL HIGH (ref 98–192)

## 2018-05-30 ENCOUNTER — Other Ambulatory Visit: Payer: Self-pay

## 2018-05-30 ENCOUNTER — Encounter (HOSPITAL_COMMUNITY): Payer: Self-pay | Admitting: Hematology

## 2018-05-30 ENCOUNTER — Inpatient Hospital Stay (HOSPITAL_BASED_OUTPATIENT_CLINIC_OR_DEPARTMENT_OTHER): Payer: Medicare Other | Admitting: Hematology

## 2018-05-30 DIAGNOSIS — C189 Malignant neoplasm of colon, unspecified: Secondary | ICD-10-CM | POA: Diagnosis not present

## 2018-05-30 DIAGNOSIS — C7801 Secondary malignant neoplasm of right lung: Secondary | ICD-10-CM

## 2018-05-30 NOTE — Progress Notes (Signed)
Virtual Visit via Telephone Note  I connected with Gary Nelson on 05/30/18 at 10:15 AM EDT by telephone and verified that I am speaking with the correct person using two identifiers.   I discussed the limitations, risks, security and privacy concerns of performing an evaluation and management service by telephone and the availability of in person appointments. I also discussed with the patient that there may be a patient responsible charge related to this service. The patient expressed understanding and agreed to proceed.   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 Patient was previously treated with Xeloda and Avastin but could not tolerate it.  Recently started on Stivarga.   Observations/Objective: Cycle 3 Stivarga 80 mg 3 weeks on 1 week off started on 05/16/2018.  He does not report any nausea, vomiting or diarrhea.  He does not have any symptoms of hand-foot skin reaction.  No fevers or infections noted.  His appetite has been good.  No new pains noted.  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 -So far he has tolerated well.  No symptoms of hand-foot skin reaction or GI symptoms.  No severe tiredness noted. -I have reviewed his blood work in detail.  I will see him back in 4 weeks for follow-up with repeat CEA level.  Follow Up Instructions: RTC 4 weeks with labs on same day.   I discussed the assessment and treatment plan with the patient. The patient was provided an opportunity to ask questions and all were answered. The patient agreed with the plan and demonstrated an understanding of the instructions.   The patient was advised to call back or seek an in-person evaluation if the symptoms worsen or if the condition fails to improve as anticipated.  I provided 14 minutes of non-face-to-face time during this encounter.   Derek Jack, MD

## 2018-05-30 NOTE — Progress Notes (Signed)
Pt is taking Stivarga chemo pill. Pt has no complaints of side effects while taking this medication.

## 2018-06-13 DIAGNOSIS — B351 Tinea unguium: Secondary | ICD-10-CM | POA: Diagnosis not present

## 2018-06-13 DIAGNOSIS — M79676 Pain in unspecified toe(s): Secondary | ICD-10-CM | POA: Diagnosis not present

## 2018-06-13 DIAGNOSIS — L84 Corns and callosities: Secondary | ICD-10-CM | POA: Diagnosis not present

## 2018-06-13 DIAGNOSIS — E1142 Type 2 diabetes mellitus with diabetic polyneuropathy: Secondary | ICD-10-CM | POA: Diagnosis not present

## 2018-06-28 ENCOUNTER — Inpatient Hospital Stay (HOSPITAL_COMMUNITY): Payer: Medicare Other | Attending: Hematology

## 2018-06-28 ENCOUNTER — Encounter (HOSPITAL_COMMUNITY): Payer: Self-pay | Admitting: Hematology

## 2018-06-28 ENCOUNTER — Other Ambulatory Visit: Payer: Self-pay

## 2018-06-28 ENCOUNTER — Inpatient Hospital Stay (HOSPITAL_BASED_OUTPATIENT_CLINIC_OR_DEPARTMENT_OTHER): Payer: Medicare Other | Admitting: Hematology

## 2018-06-28 DIAGNOSIS — C7801 Secondary malignant neoplasm of right lung: Secondary | ICD-10-CM

## 2018-06-28 DIAGNOSIS — R0602 Shortness of breath: Secondary | ICD-10-CM | POA: Insufficient documentation

## 2018-06-28 DIAGNOSIS — E119 Type 2 diabetes mellitus without complications: Secondary | ICD-10-CM

## 2018-06-28 DIAGNOSIS — Z7982 Long term (current) use of aspirin: Secondary | ICD-10-CM | POA: Diagnosis not present

## 2018-06-28 DIAGNOSIS — Z923 Personal history of irradiation: Secondary | ICD-10-CM

## 2018-06-28 DIAGNOSIS — Z791 Long term (current) use of non-steroidal anti-inflammatories (NSAID): Secondary | ICD-10-CM | POA: Diagnosis not present

## 2018-06-28 DIAGNOSIS — R200 Anesthesia of skin: Secondary | ICD-10-CM | POA: Diagnosis not present

## 2018-06-28 DIAGNOSIS — Z86718 Personal history of other venous thrombosis and embolism: Secondary | ICD-10-CM

## 2018-06-28 DIAGNOSIS — I10 Essential (primary) hypertension: Secondary | ICD-10-CM

## 2018-06-28 DIAGNOSIS — R197 Diarrhea, unspecified: Secondary | ICD-10-CM | POA: Insufficient documentation

## 2018-06-28 DIAGNOSIS — C189 Malignant neoplasm of colon, unspecified: Secondary | ICD-10-CM | POA: Diagnosis not present

## 2018-06-28 DIAGNOSIS — Z87891 Personal history of nicotine dependence: Secondary | ICD-10-CM | POA: Diagnosis not present

## 2018-06-28 DIAGNOSIS — Z794 Long term (current) use of insulin: Secondary | ICD-10-CM | POA: Insufficient documentation

## 2018-06-28 DIAGNOSIS — Z79899 Other long term (current) drug therapy: Secondary | ICD-10-CM | POA: Insufficient documentation

## 2018-06-28 DIAGNOSIS — Z9221 Personal history of antineoplastic chemotherapy: Secondary | ICD-10-CM

## 2018-06-28 DIAGNOSIS — Z8249 Family history of ischemic heart disease and other diseases of the circulatory system: Secondary | ICD-10-CM | POA: Insufficient documentation

## 2018-06-28 LAB — CBC WITH DIFFERENTIAL/PLATELET
Abs Immature Granulocytes: 0.03 10*3/uL (ref 0.00–0.07)
Basophils Absolute: 0.1 10*3/uL (ref 0.0–0.1)
Basophils Relative: 1 %
Eosinophils Absolute: 0.3 10*3/uL (ref 0.0–0.5)
Eosinophils Relative: 4 %
HCT: 39.2 % (ref 39.0–52.0)
Hemoglobin: 13.1 g/dL (ref 13.0–17.0)
Immature Granulocytes: 0 %
Lymphocytes Relative: 15 %
Lymphs Abs: 1.3 10*3/uL (ref 0.7–4.0)
MCH: 32 pg (ref 26.0–34.0)
MCHC: 33.4 g/dL (ref 30.0–36.0)
MCV: 95.8 fL (ref 80.0–100.0)
Monocytes Absolute: 0.7 10*3/uL (ref 0.1–1.0)
Monocytes Relative: 8 %
Neutro Abs: 6.5 10*3/uL (ref 1.7–7.7)
Neutrophils Relative %: 72 %
Platelets: 206 10*3/uL (ref 150–400)
RBC: 4.09 MIL/uL — ABNORMAL LOW (ref 4.22–5.81)
RDW: 14.2 % (ref 11.5–15.5)
WBC: 9 10*3/uL (ref 4.0–10.5)
nRBC: 0 % (ref 0.0–0.2)

## 2018-06-28 LAB — COMPREHENSIVE METABOLIC PANEL
ALT: 26 U/L (ref 0–44)
AST: 29 U/L (ref 15–41)
Albumin: 3.3 g/dL — ABNORMAL LOW (ref 3.5–5.0)
Alkaline Phosphatase: 67 U/L (ref 38–126)
Anion gap: 6 (ref 5–15)
BUN: 17 mg/dL (ref 8–23)
CO2: 22 mmol/L (ref 22–32)
Calcium: 8.4 mg/dL — ABNORMAL LOW (ref 8.9–10.3)
Chloride: 111 mmol/L (ref 98–111)
Creatinine, Ser: 0.81 mg/dL (ref 0.61–1.24)
GFR calc Af Amer: >60 mL/min (ref >60)
GFR calc non Af Amer: >60 mL/min (ref >60)
Glucose, Bld: 77 mg/dL (ref 70–99)
Potassium: 5.1 mmol/L (ref 3.5–5.1)
Sodium: 139 mmol/L (ref 135–145)
Total Bilirubin: 0.9 mg/dL (ref 0.3–1.2)
Total Protein: 6.3 g/dL — ABNORMAL LOW (ref 6.5–8.1)

## 2018-06-28 LAB — MAGNESIUM: Magnesium: 2.1 mg/dL (ref 1.7–2.4)

## 2018-06-28 NOTE — Assessment & Plan Note (Addendum)
1.  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 - Lung needle biopsy consistent with metastatic adenocarcinoma on 09/16/2015. - BRAF [id number] mutation positive - Status post SBRT to the RLL lung lesion on 10/12/2015 by Dr. [name] -PET scan in July 2019 showed hypermetabolism in the right lower lower lobe. -He was treated with Xeloda in August 2019 and developed severe rash in the groin and buttocks and could not tolerate it.  He also received 1 dose of Avastin. - Last PET scan on 01/15/2018 shows similar size and metabolic activity in the right lower lobe concerning for residual carcinoma.  No other evidence of metastatic disease. - He was started on Stivarga sometime in February 2020.  Cycle 3 of Stivarga 80 mg 3 weeks on 1 week off on 05/16/2018.  Last CEA was 4.4 on 04/10/2018. - He is currently taking Stivarga 80 mg without any major problems.  No signs or symptoms of hand-foot skin reaction.  He will take his last pill on 07/03/2018. -he will restart his next cycle on 07/10/2018.  I will see him back in 4 weeks with repeat CT scan of the chest.  We will also obtain a CEA level.

## 2018-06-28 NOTE — Progress Notes (Signed)
Gary Nelson, [place] [id number]   CLINIC:  Medical Oncology/Hematology  PCP:  [facility], [place] [address] [phone]   REASON FOR VISIT:  Follow-up for metastatic colon cancer to the lungs.  CURRENT THERAPY: Stivarga 80 mg 3 weeks on 1 week off.  BRIEF ONCOLOGIC HISTORY:    Adenocarcinoma of colon ([place])   10/04/2012 Definitive Surgery    Partial collectomy by Dr. [name]    10/04/2012 Pathology Results    4.5 cm poorly differentiated adenocarcinoma, negative margins, 0/7 lymph nodes for metastatic disease.  Oncotype recurrence score of 26 (high)     Chemotherapy    FOLFOX     Adverse Reaction    Progressive peripheral neuropathy     Chemotherapy    Infusional 5 FU.  Completed 6 months worth of adjuvant therapy.    02/23/2014 Procedure    Colonoscopy by Dr. Anthony Sar.    07/01/2015 PET scan    There is malignant range uptake with RLL pulmonary nodule suspicious for metastatic disease or primary pulm neoplasm. Nonspecific focus of uptake in the L femoral neck. No corresponding CT abnormality. Cannot rule out metastasis.    07/06/2015 Imaging    MR C-spine- C5-6 there is a broad-based disc bulge w R paracentral disc protrusion deforming the spinal cord. Moderate R foraminal stenosis. C6-7 there is a central disc protrusion slightly eccentric towards the R and mildly deforming the ventral c-spine     09/16/2015 Procedure    RLL needle biopsy by IR.    09/17/2015 Pathology Results    Metastatic adenocarcinoma consistent with a primary colorectal adenocarcinoma.    10/12/2015 - 10/22/2015 Radiation Therapy    SBRT by Dr. [name] to RLL pulmonary lesion (biopsy proven to be oligometastasis).    12/09/2015 Imaging    CT chest- 1. Slight interval decrease in size of the right lower lobe pulmonary nodule. Some surrounding radiation changes. 2. No mediastinal or hilar mass or adenopathy. 3. No new pulmonary  nodules. 4. Stable underline emphysematous changes. 5. Cirrhotic changes involving the liver but no upper abdominal metastatic disease.    03/14/2016 Imaging    CT chest- 1. Continued mild reduction in the size of the treated right lobe pulmonary nodule. 2. Increased patchy consolidation, reticulation and ground-glass attenuation in the basilar right lower lobe, nonspecific, favor evolving postradiation change, recommend attention on follow-up chest CT. 3. No evidence of new or progressive metastatic disease in the chest. 4. Aortic atherosclerosis. Left main and 3 vessel coronary atherosclerosis. 5. Mild emphysema.    06/30/2016 Imaging    CT CAP- 1. The appearance of the treated right lower lobe nodule is essentially unchanged, and there are evolving postradiation changes in the base of the right lower lobe, as above. No definite signs of new metastatic disease are noted elsewhere in the chest, abdomen or pelvis. 2. Aortic atherosclerosis, in addition to left main and 3 vessel coronary artery disease. Assessment for potential risk factor modification, dietary therapy or pharmacologic therapy may be warranted, if clinically indicated. 3. Morphologic changes in the liver suggestive of cirrhosis, as above. 4. Additional incidental findings, similar prior studies, as above.    11/17/2016 Imaging    CT CAP Study Result   CLINICAL DATA:  Stage IV colon cancer originally diagnosed in August 2014 status post partial colectomy, with right lower lobe lung metastasis diagnosed August 2017 status post SBRT completed 10/22/2015. Restaging.  EXAM: CT CHEST, ABDOMEN, AND  PELVIS WITH CONTRAST  TECHNIQUE: Multidetector CT imaging of the chest, abdomen and pelvis was performed following the standard protocol during bolus administration of intravenous contrast.  CONTRAST:  158m ISOVUE-300 IOPAMIDOL (ISOVUE-300) INJECTION 61%  COMPARISON:  06/30/2016 CT chest, abdomen and  pelvis.  FINDINGS: CT CHEST FINDINGS  Cardiovascular: Normal heart size. No significant pericardial fluid/thickening. Left main, left anterior descending, left circumflex and right coronary atherosclerosis. Atherosclerotic nonaneurysmal thoracic aorta. Normal caliber pulmonary arteries. No central pulmonary emboli. Right internal jugular MediPort terminates at the junction of the right and left brachiocephalic veins.  Mediastinum/Nodes: No discrete thyroid nodules. Unremarkable esophagus. No pathologically enlarged axillary, mediastinal or hilar lymph nodes.  Lungs/Pleura: No pneumothorax. No pleural effusion. There is a nodular 2.7 x 1.6 cm focus of consolidation in the medial basilar right lower lobe (series 3/ image 108), previously 1.6 x 1.1 cm. No appreciable change in patchy subpleural reticulation in both lungs without significant traction bronchiectasis or frank honeycombing. Mild centrilobular and paraseptal emphysema with mild diffuse bronchial wall thickening. No acute consolidative airspace disease or new significant pulmonary nodules.  Musculoskeletal: No aggressive appearing focal osseous lesions. Moderate thoracic spondylosis. Stable chronic left scapular deformity.  CT ABDOMEN PELVIS FINDINGS  Hepatobiliary: Normal liver with no liver mass. Cholecystectomy. No biliary ductal dilatation.  Pancreas: Normal, with no mass or duct dilation.  Spleen: Normal size. No mass.  Adrenals/Urinary Tract: Normal adrenals. No hydronephrosis. No renal masses. Normal bladder.  Stomach/Bowel: Grossly normal stomach. Normal caliber small bowel with no small bowel wall thickening. Stable appearance of the appendix, within normal limits. Stable postsurgical changes from partial distal colectomy with intact appearing distal colonic anastomosis. Oral contrast transits to the cecum. No large bowel wall thickening or new pericholecystic fat stranding  .  Vascular/Lymphatic: Atherosclerotic nonaneurysmal abdominal aorta. Patent portal, splenic, hepatic and renal veins. No pathologically enlarged lymph nodes in the abdomen or pelvis.  Reproductive:  Normal size prostate.  Other: No pneumoperitoneum, ascites or focal fluid collection. Stable subcutaneous calcified granulomas throughout the left lower back and gluteal regions.  Musculoskeletal: No aggressive appearing focal osseous lesions. Marked lumbar spondylosis.  IMPRESSION: 1. Nodular focus of consolidation in the medial basilar right lower lobe is increased in size. While this may represent evolving masslike radiation fibrosis, recurrence of the lung metastasis is not excluded. PET-CT would be useful for further evaluation. Otherwise, continued close chest CT surveillance is advised . 2. No additional potential new sites of metastatic disease in the chest, abdomen or pelvis. 3. No evidence of local tumor recurrence at the distal colonic anastomosis. 4. Left main and 3 vessel coronary atherosclerosis . 5. Aortic Atherosclerosis (ICD10-I70.0) and Emphysema (ICD10-J43.9).       11/30/2016 Relapse/Recurrence    PET: IMPRESSION: 1. Enlarging right lower lobe pulmonary nodule and slight increase in the SUV max suggesting residual neoplasm. No new pulmonary lesions. 2. No findings for local recurrence or abdominal/pelvic metastatic disease.    01/02/2017 - 01/12/2017 Radiation Therapy     The patient saw Dr. MLisbeth Renshawfor SBRT radiation treatment. The tumor in the right lung was treated with a course of stereotactic body radiation treatment. The patient received 50 Gy In 5 fractions at 10 Gy per fraction.       10/05/2017 -  Chemotherapy    The patient had bevacizumab (AVASTIN) 900 mg in sodium chloride 0.9 % 100 mL chemo infusion, 7.3 mg/kg = 925 mg, Intravenous,  Once, 1 of 3 cycles Administration: 900 mg (10/05/2017)  for chemotherapy treatment.  CANCER  STAGING: Cancer Staging Adenocarcinoma of colon [facility]) Staging form: Colon and Rectum, AJCC 7th Edition - Clinical stage from 10/04/2012: Stage IIA (T3, N0, M0) - Signed by Baird Cancer, PA-C on 07/15/2015 - Pathologic stage from 09/17/2015: Stage IVA (T3, N0, M1a) - Signed by Baird Cancer, PA-C on 10/05/2015    INTERVAL HISTORY:  Mr. Nelson 78 y.o. male returns for follow-up of his treatment for colon cancer.  He is taking Stivarga 80 mg 3 weeks on 1 week off.  He has some baseline diarrhea which is stable.  Some shortness of breath on exertion which is also stable.  Numbness in the legs which is also stable.  Denies any mucositis.  Denies any hand-foot skin reaction.  Energy levels are 50% and appetite is 100%.  Denies any nausea vomiting diarrhea or constipation.  Denies any fevers or infections or hospitalizations.    REVIEW OF SYSTEMS:  Review of Systems  Respiratory: Positive for shortness of breath.   Gastrointestinal: Positive for diarrhea.  Neurological: Positive for numbness.  All other systems reviewed and are negative.    PAST MEDICAL/SURGICAL HISTORY:  Past Medical History:  Diagnosis Date   Adenocarcinoma of colon ([place])    RLL -  Pulmonary nodule (06/2015, concerning for mets vs primary; to see [facility] Dr. [name]); stage 2 adenocarinoma   2014 - Colon Cancer - surgery and chemo   Blood infection ([place])    Cystitis    Diabetes mellitus without complication ([place])    Type II   DVT (deep venous thrombosis) ([place]) 2014   post op   Hematuria    Hypertension    Neuropathy    Prostatitis    Pulmonary nodule    Sleep apnea    Past Surgical History:  Procedure Laterality Date   BACK SURGERY     CATARACT EXTRACTION     left   CATARACT EXTRACTION W/PHACO Right 04/11/2012   Procedure: CATARACT EXTRACTION PHACO AND INTRAOCULAR LENS PLACEMENT ([place]);  Surgeon: Tonny Branch, MD;  Location: [facility];  Service: Ophthalmology;  Laterality: Right;  CDE:62.48    CERVICAL FUSION     c4-c5   CHOLECYSTECTOMY     COLON SURGERY  09/29/12   Colon resection for cancer   COLONOSCOPY     EYE SURGERY     cataract   GIVENS CAPSULE STUDY N/A 03/15/2015   Procedure: GIVENS CAPSULE STUDY;  Surgeon: Rogene Houston, MD;  Location: [facility] SUITE;  Service: Endoscopy;  Laterality: N/A;  730   LEG SURGERY     ORIF FEMUR FRACTURE     right   POSTERIOR CERVICAL FUSION/FORAMINOTOMY N/A 07/27/2015   Procedure: Cervical four to Cervical seven Laminectomy with Cervical four to Thoracic one Dorsal internal fixation and fusion;  Surgeon: Kevan Ny Ditty, MD;  Location: [facility];  Service: Neurosurgery;  Laterality: N/A;  C4 to C7 Laminectomy with C4 to T1 Dorsal internal fixation and fusion     SOCIAL HISTORY:  Social History   Socioeconomic History   Marital status: Single    Spouse name: Not on file   Number of children: Not on file   Years of education: Not on file   Highest education level: Not on file  Occupational History   Not on file  Social Needs   Financial resource strain: Not on file   Food insecurity:    Worry: Not on file    Inability: Not on file   Transportation needs:    Medical:  Not on file    Non-medical: Not on file  Tobacco Use   Smoking status: Former Smoker    Packs/day: 1.00    Years: 55.00    Pack years: 55.00    Types: Cigarettes    Start date: 02/07/1955   Smokeless tobacco: Never Used   Tobacco comment: quit 2015  Substance and Sexual Activity   Alcohol use: Not Currently    Alcohol/week: 3.0 standard drinks    Types: 3 Cans of beer per week   Drug use: No   Sexual activity: Yes    Birth control/protection: None  Lifestyle   Physical activity:    Days per week: Not on file    Minutes per session: Not on file   Stress: Not on file  Relationships   Social connections:    Talks on phone: Not on file    Gets together: Not on file    Attends religious service: Not on file    Active  member of club or organization: Not on file    Attends meetings of clubs or organizations: Not on file    Relationship status: Not on file   Intimate partner violence:    Fear of current or ex partner: Not on file    Emotionally abused: Not on file    Physically abused: Not on file    Forced sexual activity: Not on file  Other Topics Concern   Not on file  Social History Narrative   Not on file    FAMILY HISTORY:  Family History  Problem Relation Age of Onset   Heart disease Father    Heart attack Father    Alzheimer's disease Mother     CURRENT MEDICATIONS:  Outpatient Encounter Medications as of 06/28/2018  Medication Sig   aspirin EC 81 MG tablet Take 81 mg by mouth daily.   atorvastatin (LIPITOR) 10 MG tablet Take 10 mg by mouth daily at 6 PM.    furosemide (LASIX) 20 MG tablet Take 20 mg by mouth every other day.    gabapentin (NEURONTIN) 300 MG capsule Take 2 capsules (600 mg total) by mouth 3 (three) times daily. (Patient taking differently: Take 600 mg by mouth 2 (two) times daily. )   insulin NPH (HUMULIN N,NOVOLIN N) 100 UNIT/ML injection Inject 15-30 Units into the skin See admin instructions. 25 units every morning and 14 units in the evening   lisinopril (PRINIVIL,ZESTRIL) 40 MG tablet Take 40 mg by mouth daily.   naproxen (NAPROSYN) 500 MG tablet TAKE 1 TABLET BY MOUTH TWICE A DAY WITH MEALS   OXYGEN Inhale 2 L into the lungs continuous. At night time only   regorafenib (STIVARGA) 40 MG tablet Take 2 tablets (80 mg total) by mouth daily. Take with low fat breakfast. Take for 21days, then hold for 7days. Take as directed.   silver sulfADIAZINE (SILVADENE) 1 % cream    [DISCONTINUED] nystatin-triamcinolone (MYCOLOG II) cream Apply topically.   HYDROcodone-acetaminophen (NORCO/VICODIN) 5-325 MG tablet Take 1 tablet by mouth as needed.    No facility-administered encounter medications on file as of 06/28/2018.     ALLERGIES:  No Known  Allergies   PHYSICAL EXAM:  ECOG Performance status: 2  Vitals:   06/28/18 1200  BP: (!) 158/84  Pulse: 98  Resp: 16  Temp: 98.5 F (36.9 C)  SpO2: 95%   Filed Weights   06/28/18 1200  Weight: 250 lb (113.4 kg)    Physical Exam Vitals signs reviewed.  Constitutional:  Appearance: Normal appearance.  Cardiovascular:     Rate and Rhythm: Normal rate and regular rhythm.     Heart sounds: Normal heart sounds.  Pulmonary:     Effort: Pulmonary effort is normal.     Breath sounds: Normal breath sounds.  Abdominal:     General: There is no distension.     Palpations: Abdomen is soft. There is no mass.  Musculoskeletal:        General: No swelling.  Neurological:     General: No focal deficit present.     Mental Status: He is alert and oriented to person, place, and time.  Psychiatric:        Mood and Affect: Mood normal.        Behavior: Behavior normal.      LABORATORY DATA:  I have reviewed the labs as listed.  CBC    Component Value Date/Time   WBC 9.0 06/28/2018 1116   RBC 4.09 (L) 06/28/2018 1116   HGB 13.1 06/28/2018 1116   HCT 39.2 06/28/2018 1116   PLT 206 06/28/2018 1116   MCV 95.8 06/28/2018 1116   MCH 32.0 06/28/2018 1116   MCHC 33.4 06/28/2018 1116   RDW 14.2 06/28/2018 1116   LYMPHSABS 1.3 06/28/2018 1116   MONOABS 0.7 06/28/2018 1116   EOSABS 0.3 06/28/2018 1116   BASOSABS 0.1 06/28/2018 1116   CMP Latest Ref Rng & Units 06/28/2018 05/23/2018 04/10/2018  Glucose 70 - 99 mg/dL 77 105(H) 128(H)  BUN 8 - 23 mg/dL 17 18 21   Creatinine 0.61 - 1.24 mg/dL 0.81 0.88 1.03  Sodium 135 - 145 mmol/L 139 139 137  Potassium 3.5 - 5.1 mmol/L 5.1 4.5 4.7  Chloride 98 - 111 mmol/L 111 108 109  CO2 22 - 32 mmol/L 22 24 23   Calcium 8.9 - 10.3 mg/dL 8.4(L) 8.5(L) 8.1(L)  Total Protein 6.5 - 8.1 g/dL 6.3(L) 6.6 6.3(L)  Total Bilirubin 0.3 - 1.2 mg/dL 0.9 0.9 0.9  Alkaline Phos 38 - 126 U/L 67 74 89  AST 15 - 41 U/L 29 28 28   ALT 0 - 44 U/L 26 24 27         DIAGNOSTIC IMAGING:  I have independently reviewed the scans and discussed with the patient.      ASSESSMENT & PLAN:   Adenocarcinoma of colon ([place]) 1.  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 - Lung needle biopsy consistent with metastatic adenocarcinoma on 09/16/2015. - BRAF [id number] mutation positive - Status post SBRT to the RLL lung lesion on 10/12/2015 by Dr. [name] -PET scan in July 2019 showed hypermetabolism in the right lower lower lobe. -He was treated with Xeloda in August 2019 and developed severe rash in the groin and buttocks and could not tolerate it.  He also received 1 dose of Avastin. - Last PET scan on 01/15/2018 shows similar size and metabolic activity in the right lower lobe concerning for residual carcinoma.  No other evidence of metastatic disease. - He was started on Stivarga sometime in February 2020.  Cycle 3 of Stivarga 80 mg 3 weeks on 1 week off on 05/16/2018.  Last CEA was 4.4 on 04/10/2018. - He is currently taking Stivarga 80 mg without any major problems.  No signs or symptoms of hand-foot skin reaction.  He will take his last pill on 07/03/2018. -he will restart his next cycle on 07/10/2018.  I will see him back in  4 weeks with repeat CT scan of the chest.  We will also obtain a CEA level.  Total time spent is 25 minutes with more than 50% of the time spent face-to-face discussing treatment plan and coordination of care.    Orders placed this encounter:  Orders Placed This Encounter  Procedures   CT Chest W Contrast   CBC with Differential   Comprehensive metabolic panel   CEA   Magnesium      Derek Jack, MD [place] [phone]

## 2018-06-29 LAB — CEA: CEA: 4.6 ng/mL (ref 0.0–4.7)

## 2018-07-03 DIAGNOSIS — C7800 Secondary malignant neoplasm of unspecified lung: Secondary | ICD-10-CM | POA: Diagnosis not present

## 2018-07-03 DIAGNOSIS — Z794 Long term (current) use of insulin: Secondary | ICD-10-CM | POA: Diagnosis not present

## 2018-07-03 DIAGNOSIS — E119 Type 2 diabetes mellitus without complications: Secondary | ICD-10-CM | POA: Diagnosis not present

## 2018-07-03 DIAGNOSIS — R197 Diarrhea, unspecified: Secondary | ICD-10-CM | POA: Diagnosis not present

## 2018-07-25 ENCOUNTER — Ambulatory Visit (HOSPITAL_COMMUNITY)
Admission: RE | Admit: 2018-07-25 | Discharge: 2018-07-25 | Disposition: A | Discharge status: Home / Self Care | Payer: Medicare Other | Source: Ambulatory Visit | Attending: Hematology | Admitting: Hematology

## 2018-07-25 ENCOUNTER — Other Ambulatory Visit: Payer: Self-pay

## 2018-07-25 ENCOUNTER — Inpatient Hospital Stay (HOSPITAL_COMMUNITY): Payer: Medicare Other | Attending: Hematology

## 2018-07-25 DIAGNOSIS — Z791 Long term (current) use of non-steroidal anti-inflammatories (NSAID): Secondary | ICD-10-CM | POA: Diagnosis not present

## 2018-07-25 DIAGNOSIS — I10 Essential (primary) hypertension: Secondary | ICD-10-CM | POA: Diagnosis not present

## 2018-07-25 DIAGNOSIS — Z923 Personal history of irradiation: Secondary | ICD-10-CM | POA: Diagnosis not present

## 2018-07-25 DIAGNOSIS — C189 Malignant neoplasm of colon, unspecified: Secondary | ICD-10-CM | POA: Diagnosis not present

## 2018-07-25 DIAGNOSIS — R197 Diarrhea, unspecified: Secondary | ICD-10-CM | POA: Insufficient documentation

## 2018-07-25 DIAGNOSIS — C7801 Secondary malignant neoplasm of right lung: Secondary | ICD-10-CM | POA: Diagnosis not present

## 2018-07-25 DIAGNOSIS — Z7982 Long term (current) use of aspirin: Secondary | ICD-10-CM | POA: Insufficient documentation

## 2018-07-25 DIAGNOSIS — Z86718 Personal history of other venous thrombosis and embolism: Secondary | ICD-10-CM | POA: Diagnosis not present

## 2018-07-25 DIAGNOSIS — E1140 Type 2 diabetes mellitus with diabetic neuropathy, unspecified: Secondary | ICD-10-CM | POA: Insufficient documentation

## 2018-07-25 DIAGNOSIS — J90 Pleural effusion, not elsewhere classified: Secondary | ICD-10-CM | POA: Diagnosis not present

## 2018-07-25 DIAGNOSIS — Z87891 Personal history of nicotine dependence: Secondary | ICD-10-CM | POA: Diagnosis not present

## 2018-07-25 DIAGNOSIS — Z8249 Family history of ischemic heart disease and other diseases of the circulatory system: Secondary | ICD-10-CM | POA: Insufficient documentation

## 2018-07-25 DIAGNOSIS — R0602 Shortness of breath: Secondary | ICD-10-CM | POA: Insufficient documentation

## 2018-07-25 DIAGNOSIS — Z794 Long term (current) use of insulin: Secondary | ICD-10-CM | POA: Insufficient documentation

## 2018-07-25 DIAGNOSIS — Z79899 Other long term (current) drug therapy: Secondary | ICD-10-CM | POA: Insufficient documentation

## 2018-07-25 DIAGNOSIS — J439 Emphysema, unspecified: Secondary | ICD-10-CM | POA: Diagnosis not present

## 2018-07-25 DIAGNOSIS — Z9221 Personal history of antineoplastic chemotherapy: Secondary | ICD-10-CM | POA: Insufficient documentation

## 2018-07-25 LAB — COMPREHENSIVE METABOLIC PANEL
ALT: 27 U/L (ref 0–44)
AST: 28 U/L (ref 15–41)
Albumin: 3.4 g/dL — ABNORMAL LOW (ref 3.5–5.0)
Alkaline Phosphatase: 69 U/L (ref 38–126)
Anion gap: 6 (ref 5–15)
BUN: 20 mg/dL (ref 8–23)
CO2: 24 mmol/L (ref 22–32)
Calcium: 8.2 mg/dL — ABNORMAL LOW (ref 8.9–10.3)
Chloride: 109 mmol/L (ref 98–111)
Creatinine, Ser: 0.97 mg/dL (ref 0.61–1.24)
GFR calc Af Amer: >60 mL/min (ref >60)
GFR calc non Af Amer: >60 mL/min (ref >60)
Glucose, Bld: 94 mg/dL (ref 70–99)
Potassium: 4.9 mmol/L (ref 3.5–5.1)
Sodium: 139 mmol/L (ref 135–145)
Total Bilirubin: 0.9 mg/dL (ref 0.3–1.2)
Total Protein: 6.5 g/dL (ref 6.5–8.1)

## 2018-07-25 LAB — CBC WITH DIFFERENTIAL/PLATELET
Abs Immature Granulocytes: 0.04 10*3/uL (ref 0.00–0.07)
Basophils Absolute: 0.1 10*3/uL (ref 0.0–0.1)
Basophils Relative: 1 %
Eosinophils Absolute: 0.3 10*3/uL (ref 0.0–0.5)
Eosinophils Relative: 4 %
HCT: 41.2 % (ref 39.0–52.0)
Hemoglobin: 13.5 g/dL (ref 13.0–17.0)
Immature Granulocytes: 1 %
Lymphocytes Relative: 17 %
Lymphs Abs: 1.4 10*3/uL (ref 0.7–4.0)
MCH: 31.4 pg (ref 26.0–34.0)
MCHC: 32.8 g/dL (ref 30.0–36.0)
MCV: 95.8 fL (ref 80.0–100.0)
Monocytes Absolute: 0.8 10*3/uL (ref 0.1–1.0)
Monocytes Relative: 10 %
Neutro Abs: 5.6 10*3/uL (ref 1.7–7.7)
Neutrophils Relative %: 67 %
Platelets: 220 10*3/uL (ref 150–400)
RBC: 4.3 MIL/uL (ref 4.22–5.81)
RDW: 14.3 % (ref 11.5–15.5)
WBC: 8.2 10*3/uL (ref 4.0–10.5)
nRBC: 0 % (ref 0.0–0.2)

## 2018-07-25 LAB — MAGNESIUM: Magnesium: 2 mg/dL (ref 1.7–2.4)

## 2018-07-25 MED ORDER — IOHEXOL 300 MG/ML  SOLN
75.0000 mL | Freq: Once | INTRAMUSCULAR | Status: AC | PRN
  Administered 2018-07-25: 75 mL via INTRAVENOUS

## 2018-07-26 LAB — CEA: CEA: 4.1 ng/mL (ref 0.0–4.7)

## 2018-07-30 ENCOUNTER — Encounter (HOSPITAL_COMMUNITY): Payer: Self-pay | Admitting: Hematology

## 2018-07-30 ENCOUNTER — Other Ambulatory Visit: Payer: Self-pay

## 2018-07-30 ENCOUNTER — Inpatient Hospital Stay (HOSPITAL_BASED_OUTPATIENT_CLINIC_OR_DEPARTMENT_OTHER): Payer: Medicare Other | Admitting: Hematology

## 2018-07-30 ENCOUNTER — Inpatient Hospital Stay (HOSPITAL_COMMUNITY): Payer: Medicare Other

## 2018-07-30 VITALS — BP 148/59 | HR 96 | Temp 98.3°F | Resp 18 | Wt 246.2 lb

## 2018-07-30 DIAGNOSIS — Z86718 Personal history of other venous thrombosis and embolism: Secondary | ICD-10-CM | POA: Diagnosis not present

## 2018-07-30 DIAGNOSIS — R197 Diarrhea, unspecified: Secondary | ICD-10-CM

## 2018-07-30 DIAGNOSIS — Z7982 Long term (current) use of aspirin: Secondary | ICD-10-CM

## 2018-07-30 DIAGNOSIS — C189 Malignant neoplasm of colon, unspecified: Secondary | ICD-10-CM

## 2018-07-30 DIAGNOSIS — E1140 Type 2 diabetes mellitus with diabetic neuropathy, unspecified: Secondary | ICD-10-CM | POA: Diagnosis not present

## 2018-07-30 DIAGNOSIS — Z9221 Personal history of antineoplastic chemotherapy: Secondary | ICD-10-CM

## 2018-07-30 DIAGNOSIS — Z8249 Family history of ischemic heart disease and other diseases of the circulatory system: Secondary | ICD-10-CM

## 2018-07-30 DIAGNOSIS — C7801 Secondary malignant neoplasm of right lung: Secondary | ICD-10-CM | POA: Diagnosis not present

## 2018-07-30 DIAGNOSIS — Z791 Long term (current) use of non-steroidal anti-inflammatories (NSAID): Secondary | ICD-10-CM | POA: Diagnosis not present

## 2018-07-30 DIAGNOSIS — Z794 Long term (current) use of insulin: Secondary | ICD-10-CM

## 2018-07-30 DIAGNOSIS — Z79899 Other long term (current) drug therapy: Secondary | ICD-10-CM

## 2018-07-30 DIAGNOSIS — Z87891 Personal history of nicotine dependence: Secondary | ICD-10-CM

## 2018-07-30 DIAGNOSIS — I10 Essential (primary) hypertension: Secondary | ICD-10-CM | POA: Diagnosis not present

## 2018-07-30 DIAGNOSIS — R0602 Shortness of breath: Secondary | ICD-10-CM | POA: Diagnosis not present

## 2018-07-30 DIAGNOSIS — Z923 Personal history of irradiation: Secondary | ICD-10-CM

## 2018-07-30 MED ORDER — HEPARIN SOD (PORK) LOCK FLUSH 100 UNIT/ML IV SOLN
500.0000 [IU] | Freq: Once | INTRAVENOUS | Status: AC
  Administered 2018-07-30: 500 [IU] via INTRAVENOUS

## 2018-07-30 MED ORDER — SODIUM CHLORIDE 0.9% FLUSH
10.0000 mL | Freq: Once | INTRAVENOUS | Status: AC
  Administered 2018-07-30: 10 mL via INTRAVENOUS

## 2018-07-30 NOTE — Progress Notes (Signed)
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  CURRENT THERAPY: Stivarga 80 mg 3 weeks on 1 week off.  BRIEF ONCOLOGIC HISTORY:  Oncology History  Adenocarcinoma of colon ([place])  10/04/2012 Definitive Surgery   Partial collectomy by Dr. [name]   10/04/2012 Pathology Results   4.5 cm poorly differentiated adenocarcinoma, negative margins, 0/7 lymph nodes for metastatic disease.  Oncotype recurrence score of 26 (high)    Chemotherapy   FOLFOX    Adverse Reaction   Progressive peripheral neuropathy    Chemotherapy   Infusional 5 FU.  Completed 6 months worth of adjuvant therapy.   02/23/2014 Procedure   Colonoscopy by Dr. Anthony Sar.   07/01/2015 PET scan   There is malignant range uptake with RLL pulmonary nodule suspicious for metastatic disease or primary pulm neoplasm. Nonspecific focus of uptake in the L femoral neck. No corresponding CT abnormality. Cannot rule out metastasis.   07/06/2015 Imaging   MR C-spine- C5-6 there is a broad-based disc bulge w R paracentral disc protrusion deforming the spinal cord. Moderate R foraminal stenosis. C6-7 there is a central disc protrusion slightly eccentric towards the R and mildly deforming the ventral c-spine    09/16/2015 Procedure   RLL needle biopsy by IR.   09/17/2015 Pathology Results   Metastatic adenocarcinoma consistent with a primary colorectal adenocarcinoma.   10/12/2015 - 10/22/2015 Radiation Therapy   SBRT by Dr. [name] to RLL pulmonary lesion (biopsy proven to be oligometastasis).   12/09/2015 Imaging   CT chest- 1. Slight interval decrease in size of the right lower lobe pulmonary nodule. Some surrounding radiation changes. 2. No mediastinal or hilar mass or adenopathy. 3. No new pulmonary nodules. 4.  Stable underline emphysematous changes. 5. Cirrhotic changes involving the liver but no upper abdominal metastatic disease.   03/14/2016 Imaging   CT chest- 1. Continued mild reduction in the size of the treated right lobe pulmonary nodule. 2. Increased patchy consolidation, reticulation and ground-glass attenuation in the basilar right lower lobe, nonspecific, favor evolving postradiation change, recommend attention on follow-up chest CT. 3. No evidence of new or progressive metastatic disease in the chest. 4. Aortic atherosclerosis. Left main and 3 vessel coronary atherosclerosis. 5. Mild emphysema.   06/30/2016 Imaging   CT CAP- 1. The appearance of the treated right lower lobe nodule is essentially unchanged, and there are evolving postradiation changes in the base of the right lower lobe, as above. No definite signs of new metastatic disease are noted elsewhere in the chest, abdomen or pelvis. 2. Aortic atherosclerosis, in addition to left main and 3 vessel coronary artery disease. Assessment for potential risk factor modification, dietary therapy or pharmacologic therapy may be warranted, if clinically indicated. 3. Morphologic changes in the liver suggestive of cirrhosis, as above. 4. Additional incidental findings, similar prior studies, as above.   11/17/2016 Imaging   CT CAP Study Result   CLINICAL DATA:  Stage IV colon cancer originally diagnosed in August 2014 status post partial colectomy, with right lower lobe lung metastasis diagnosed August 2017 status post SBRT completed 10/22/2015. Restaging.  EXAM: CT CHEST, ABDOMEN, AND PELVIS WITH CONTRAST  TECHNIQUE: Multidetector CT imaging of the chest, abdomen and pelvis was performed following the standard protocol during bolus administration of intravenous contrast.  CONTRAST:  139m ISOVUE-300 IOPAMIDOL (ISOVUE-300) INJECTION 61%  COMPARISON:  06/30/2016 CT chest, abdomen and pelvis.  FINDINGS: CT  CHEST FINDINGS  Cardiovascular: Normal heart size. No significant pericardial fluid/thickening. Left main, left anterior descending, left circumflex and right coronary atherosclerosis. Atherosclerotic nonaneurysmal thoracic aorta. Normal caliber pulmonary arteries. No central pulmonary emboli. Right internal jugular MediPort terminates at the junction of the right and left brachiocephalic veins.  Mediastinum/Nodes: No discrete thyroid nodules. Unremarkable esophagus. No pathologically enlarged axillary, mediastinal or hilar lymph nodes.  Lungs/Pleura: No pneumothorax. No pleural effusion. There is a nodular 2.7 x 1.6 cm focus of consolidation in the medial basilar right lower lobe (series 3/ image 108), previously 1.6 x 1.1 cm. No appreciable change in patchy subpleural reticulation in both lungs without significant traction bronchiectasis or frank honeycombing. Mild centrilobular and paraseptal emphysema with mild diffuse bronchial wall thickening. No acute consolidative airspace disease or new significant pulmonary nodules.  Musculoskeletal: No aggressive appearing focal osseous lesions. Moderate thoracic spondylosis. Stable chronic left scapular deformity.  CT ABDOMEN PELVIS FINDINGS  Hepatobiliary: Normal liver with no liver mass. Cholecystectomy. No biliary ductal dilatation.  Pancreas: Normal, with no mass or duct dilation.  Spleen: Normal size. No mass.  Adrenals/Urinary Tract: Normal adrenals. No hydronephrosis. No renal masses. Normal bladder.  Stomach/Bowel: Grossly normal stomach. Normal caliber small bowel with no small bowel wall thickening. Stable appearance of the appendix, within normal limits. Stable postsurgical changes from partial distal colectomy with intact appearing distal colonic anastomosis. Oral contrast transits to the cecum. No large bowel wall thickening or new pericholecystic fat stranding .  Vascular/Lymphatic: Atherosclerotic  nonaneurysmal abdominal aorta. Patent portal, splenic, hepatic and renal veins. No pathologically enlarged lymph nodes in the abdomen or pelvis.  Reproductive:  Normal size prostate.  Other: No pneumoperitoneum, ascites or focal fluid collection. Stable subcutaneous calcified granulomas throughout the left lower back and gluteal regions.  Musculoskeletal: No aggressive appearing focal osseous lesions. Marked lumbar spondylosis.  IMPRESSION: 1. Nodular focus of consolidation in the medial basilar right lower lobe is increased in size. While this may represent evolving masslike radiation fibrosis, recurrence of the lung metastasis is not excluded. PET-CT would be useful for further evaluation. Otherwise, continued close chest CT surveillance is advised . 2. No additional potential new sites of metastatic disease in the chest, abdomen or pelvis. 3. No evidence of local tumor recurrence at the distal colonic anastomosis. 4. Left main and 3 vessel coronary atherosclerosis . 5. Aortic Atherosclerosis (ICD10-I70.0) and Emphysema (ICD10-J43.9).      11/30/2016 Relapse/Recurrence   PET: IMPRESSION: 1. Enlarging right lower lobe pulmonary nodule and slight increase in the SUV max suggesting residual neoplasm. No new pulmonary lesions. 2. No findings for local recurrence or abdominal/pelvic metastatic disease.   01/02/2017 - 01/12/2017 Radiation Therapy    The patient saw Dr. MLisbeth Renshawfor SBRT radiation treatment. The tumor in the right lung was treated with a course of stereotactic body radiation treatment. The patient received 50 Gy In 5 fractions at 10 Gy per fraction.      10/05/2017 -  Chemotherapy   The patient had bevacizumab (AVASTIN) 900 mg in sodium chloride 0.9 % 100 mL chemo infusion, 7.3 mg/kg = 925 mg, Intravenous,  Once, 1 of 3 cycles Administration: 900 mg (10/05/2017)  for chemotherapy treatment.       CANCER STAGING: Cancer Staging Adenocarcinoma of colon  ([facility] Staging form: Colon and Rectum, AJCC 7th Edition - Clinical stage from 10/04/2012: Stage IIA (T3, N0, M0) - Signed by KSheldon Silvan  Manon Hilding, PA-C on 07/15/2015 - Pathologic stage from 09/17/2015: Stage IVA (T3, N0, M1a) - Signed by Baird Cancer, PA-C on 10/05/2015    INTERVAL HISTORY:  Mr. Culpepper 78 y.o. male seen for follow-up of his colon cancer.  He is taking Stivarga 80 mg 3 weeks on 1 week off.  He denies any mucositis or hand-foot skin reaction.  He does have diarrhea for which he is taking 4 tablets of Imodium per day.  He has diarrhea mostly before noon time.  No nausea or vomiting reported.  He had one episode of skin rash under his abdominal skin fold which subsided.  Numbness in the hands and feet has been stable.  Appetite and energy levels are 75%.  Denies any ER visits or hospitalizations in the last 6 weeks.    REVIEW OF SYSTEMS:  Review of Systems  Respiratory: Positive for shortness of breath.   Gastrointestinal: Positive for diarrhea.  Neurological: Positive for numbness.  All other systems reviewed and are negative.    PAST MEDICAL/SURGICAL HISTORY:  Past Medical History:  Diagnosis Date  . Adenocarcinoma of colon ([place])    RLL -  Pulmonary nodule (06/2015, concerning for mets vs primary; to see [facility] Dr. [name]); stage 2 adenocarinoma   2014 - Colon Cancer - surgery and chemo  . Blood infection ([place])   . Cystitis   . Diabetes mellitus without complication ([place])    Type II  . DVT (deep venous thrombosis) ([place]) 2014   post op  . Hematuria   . Hypertension   . Neuropathy   . Prostatitis   . Pulmonary nodule   . Sleep apnea    Past Surgical History:  Procedure Laterality Date  . BACK SURGERY    . CATARACT EXTRACTION     left  . CATARACT EXTRACTION W/PHACO Right 04/11/2012   Procedure: CATARACT EXTRACTION PHACO AND INTRAOCULAR LENS PLACEMENT (IOC);  Surgeon: Tonny Branch, MD;  Location: [facility] ORS;  Service: Ophthalmology;  Laterality: Right;  CDE:62.48  .  CERVICAL FUSION     c4-c5  . CHOLECYSTECTOMY    . COLON SURGERY  09/29/12   Colon resection for cancer  . COLONOSCOPY    . EYE SURGERY     cataract  . GIVENS CAPSULE STUDY N/A 03/15/2015   Procedure: GIVENS CAPSULE STUDY;  Surgeon: Rogene Houston, MD;  Location: [facility] ENDO SUITE;  Service: Endoscopy;  Laterality: N/A;  730  . LEG SURGERY    . ORIF FEMUR FRACTURE     right  . POSTERIOR CERVICAL FUSION/FORAMINOTOMY N/A 07/27/2015   Procedure: Cervical four to Cervical seven Laminectomy with Cervical four to Thoracic one Dorsal internal fixation and fusion;  Surgeon: Kevan Ny Ditty, MD;  Location: [place] NEURO ORS;  Service: Neurosurgery;  Laterality: N/A;  C4 to C7 Laminectomy with C4 to T1 Dorsal internal fixation and fusion     SOCIAL HISTORY:  Social History   Socioeconomic History  . Marital status: Single    Spouse name: Not on file  . Number of children: Not on file  . Years of education: Not on file  . Highest education level: Not on file  Occupational History  . Not on file  Social Needs  . Financial resource strain: Not on file  . Food insecurity    Worry: Not on file    Inability: Not on file  . Transportation needs    Medical: Not on file    Non-medical: Not on file  Tobacco Use  .  Smoking status: Former Smoker    Packs/day: 1.00    Years: 55.00    Pack years: 55.00    Types: Cigarettes    Start date: 02/07/1955  . Smokeless tobacco: Never Used  . Tobacco comment: quit 2015  Substance and Sexual Activity  . Alcohol use: Not Currently    Alcohol/week: 3.0 standard drinks    Types: 3 Cans of beer per week  . Drug use: No  . Sexual activity: Yes    Birth control/protection: None  Lifestyle  . Physical activity    Days per week: Not on file    Minutes per session: Not on file  . Stress: Not on file  Relationships  . Social Herbalist on phone: Not on file    Gets together: Not on file    Attends religious service: Not on file    Active member of  club or organization: Not on file    Attends meetings of clubs or organizations: Not on file    Relationship status: Not on file  . Intimate partner violence    Fear of current or ex partner: Not on file    Emotionally abused: Not on file    Physically abused: Not on file    Forced sexual activity: Not on file  Other Topics Concern  . Not on file  Social History Narrative  . Not on file    FAMILY HISTORY:  Family History  Problem Relation Age of Onset  . Heart disease Father   . Heart attack Father   . Alzheimer's disease Mother     CURRENT MEDICATIONS:  Outpatient Encounter Medications as of 07/30/2018  Medication Sig  . aspirin EC 81 MG tablet Take 81 mg by mouth daily.  Marland Kitchen atorvastatin (LIPITOR) 10 MG tablet Take 10 mg by mouth daily at 6 PM.   . furosemide (LASIX) 20 MG tablet Take 20 mg by mouth every other day.   . gabapentin (NEURONTIN) 300 MG capsule Take 2 capsules (600 mg total) by mouth 3 (three) times daily. (Patient taking differently: Take 600 mg by mouth 2 (two) times daily. )  . insulin NPH (HUMULIN N,NOVOLIN N) 100 UNIT/ML injection Inject 15-30 Units into the skin See admin instructions. 25 units every morning and 14 units in the evening  . lisinopril (PRINIVIL,ZESTRIL) 40 MG tablet Take 40 mg by mouth daily.  . naproxen (NAPROSYN) 500 MG tablet TAKE 1 TABLET BY MOUTH TWICE A DAY WITH MEALS  . OXYGEN Inhale 2 L into the lungs continuous. At night time only  . regorafenib (STIVARGA) 40 MG tablet Take 2 tablets (80 mg total) by mouth daily. Take with low fat breakfast. Take for 21days, then hold for 7days. Take as directed.  . silver sulfADIAZINE (SILVADENE) 1 % cream   . HYDROcodone-acetaminophen (NORCO/VICODIN) 5-325 MG tablet Take 1 tablet by mouth as needed.    No facility-administered encounter medications on file as of 07/30/2018.     ALLERGIES:  No Known Allergies   PHYSICAL EXAM:  ECOG Performance status: 2  Vitals:   07/30/18 1447  BP: (!) 148/59   Pulse: 96  Resp: 18  Temp: 98.3 F (36.8 C)  SpO2: 92%   Filed Weights   07/30/18 1447  Weight: 246 lb 3.2 oz (111.7 kg)    Physical Exam Vitals signs reviewed.  Constitutional:      Appearance: Normal appearance.  Cardiovascular:     Rate and Rhythm: Normal rate and regular rhythm.  Heart sounds: Normal heart sounds.  Pulmonary:     Effort: Pulmonary effort is normal.     Breath sounds: Normal breath sounds.  Abdominal:     General: There is no distension.     Palpations: Abdomen is soft. There is no mass.  Musculoskeletal:        General: No swelling.  Neurological:     General: No focal deficit present.     Mental Status: He is alert and oriented to person, place, and time.  Psychiatric:        Mood and Affect: Mood normal.        Behavior: Behavior normal.      LABORATORY DATA:  I have reviewed the labs as listed.  CBC    Component Value Date/Time   WBC 8.2 07/25/2018 0946   RBC 4.30 07/25/2018 0946   HGB 13.5 07/25/2018 0946   HCT 41.2 07/25/2018 0946   PLT 220 07/25/2018 0946   MCV 95.8 07/25/2018 0946   MCH 31.4 07/25/2018 0946   MCHC 32.8 07/25/2018 0946   RDW 14.3 07/25/2018 0946   LYMPHSABS 1.4 07/25/2018 0946   MONOABS 0.8 07/25/2018 0946   EOSABS 0.3 07/25/2018 0946   BASOSABS 0.1 07/25/2018 0946   CMP Latest Ref Rng & Units 07/25/2018 06/28/2018 05/23/2018  Glucose 70 - 99 mg/dL 94 77 105(H)  BUN 8 - 23 mg/dL _0 Creatinine 0.61 - 1.24 mg/dL 0.97 0.81 0.88  Sodium 135 - 145 mmol/L 139 139 139  Potassium 3.5 - 5.1 mmol/L 4.9 5.1 4.5  Chloride 98 - 111 mmol/L 109 111 108  CO2 22 - 32 mmol/L _1 Calcium 8.9 - 10.3 mg/dL 8.2(L) 8.4(L) 8.5(L)  Total Protein 6.5 - 8.1 g/dL 6.5 6.3(L) 6.6  Total Bilirubin 0.3 - 1.2 mg/dL 0.9 0.9 0.9  Alkaline Phos 38 - 126 U/L 69 67 74  AST 15 - 41 U/L _2 ALT 0 - 44 U/L _3 DIAGNOSTIC IMAGING:  I have independently reviewed the scans and discussed with the patient.       ASSESSMENT & PLAN:   Adenocarcinoma of colon ([place]) 1.  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 - Lung needle biopsy consistent with metastatic adenocarcinoma on 09/16/2015. - BRAF [id number] mutation positive - Status post SBRT to the RLL lung lesion on 10/12/2015 by Dr. [name] -PET scan in July 2019 showed hypermetabolism in the right lower lower lobe. -He was treated with Xeloda in August 2019 and developed severe rash in the groin and buttocks and could not tolerate it.  He also received 1 dose of Avastin. - PET scan on 01/15/2018 shows similar size and metabolic activity in the right lower lobe concerning for residual carcinoma.  No other evidence of metastatic disease.  - Stivarga 80 mg 3 weeks on 1 week off started in February 2020. - He is tolerating it very well without any problems.  No hand-foot skin reaction noted. - CT of the chest with contrast on 07/20/2018 showed grossly stable area of presumed postradiation change in the medial posterior right lower lobe with similar areas of surrounding atelectasis and/or scarring.  No new pulmonary nodules seen. - CEA level is 4.1.  CBC and CMP were grossly normal. - I will see him back in 2 months for follow-up with repeat blood counts.  2.  Diarrhea: -  He does have diarrhea after eating, predominantly before noon time. -He is taking up to 4 tablets of Imodium daily. -I have told him to increase the Imodium to 2 tablets at the first onset of diarrhea followed by 1 tablet after each watery bowel movement, limit 8/day.   Total time spent is 25 minutes with more than 50% of the time spent face-to-face discussing treatment plan and coordination of care.    Orders placed this encounter:  Orders Placed This Encounter  Procedures  . CBC with Differential  . Comprehensive metabolic panel  . CEA  . Magnesium      Derek Jack, MD [place] [phone]

## 2018-07-30 NOTE — Progress Notes (Signed)
Patients port flushed without difficulty.  Good blood return noted before and after flush. No complaints of pain with flush and no bruising or swelling noted at site.  Band aid applied.  VSS with discharge and left ambulatory with no s/s of distress noted.

## 2018-07-30 NOTE — Assessment & Plan Note (Addendum)
1.  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 - Lung needle biopsy consistent with metastatic adenocarcinoma on 09/16/2015. - BRAF [id number] mutation positive - Status post SBRT to the RLL lung lesion on 10/12/2015 by Dr. [name] -PET scan in July 2019 showed hypermetabolism in the right lower lower lobe. -He was treated with Xeloda in August 2019 and developed severe rash in the groin and buttocks and could not tolerate it.  He also received 1 dose of Avastin. - PET scan on 01/15/2018 shows similar size and metabolic activity in the right lower lobe concerning for residual carcinoma.  No other evidence of metastatic disease.  - Stivarga 80 mg 3 weeks on 1 week off started in February 2020. - He is tolerating it very well without any problems.  No hand-foot skin reaction noted. - CT of the chest with contrast on 07/20/2018 showed grossly stable area of presumed postradiation change in the medial posterior right lower lobe with similar areas of surrounding atelectasis and/or scarring.  No new pulmonary nodules seen. - CEA level is 4.1.  CBC and CMP were grossly normal. - I will see him back in 2 months for follow-up with repeat blood counts.  2.  Diarrhea: - He does have diarrhea after eating, predominantly before noon time. -He is taking up to 4 tablets of Imodium daily. -I have told him to increase the Imodium to 2 tablets at the first onset of diarrhea followed by 1 tablet after each watery bowel movement, limit 8/day.

## 2018-08-12 ENCOUNTER — Other Ambulatory Visit (HOSPITAL_COMMUNITY): Payer: Self-pay | Admitting: *Deleted

## 2018-08-12 DIAGNOSIS — C189 Malignant neoplasm of colon, unspecified: Secondary | ICD-10-CM

## 2018-08-12 MED ORDER — REGORAFENIB 40 MG PO TABS: 80.0000 mg | ORAL_TABLET | Freq: Every day | ORAL | 2 refills | Status: DC

## 2018-09-19 DIAGNOSIS — M79676 Pain in unspecified toe(s): Secondary | ICD-10-CM | POA: Diagnosis not present

## 2018-09-19 DIAGNOSIS — L84 Corns and callosities: Secondary | ICD-10-CM | POA: Diagnosis not present

## 2018-09-19 DIAGNOSIS — B351 Tinea unguium: Secondary | ICD-10-CM | POA: Diagnosis not present

## 2018-09-19 DIAGNOSIS — E1142 Type 2 diabetes mellitus with diabetic polyneuropathy: Secondary | ICD-10-CM | POA: Diagnosis not present

## 2018-09-25 ENCOUNTER — Encounter (HOSPITAL_COMMUNITY): Payer: Self-pay | Admitting: *Deleted

## 2018-09-25 NOTE — Progress Notes (Signed)
Patient called clinic to report a large blister on his right lateral and sole of his big toe.  He reports noticing it last Friday.  I advised him to not pop the blister, let it heal, report any redness, streaking or pain.  He states that he had seen the foot doctor last week to get his feet checked.  I advised him to call that doctor and see about getting in to be seen.  He verbalizes understanding.

## 2018-10-15 ENCOUNTER — Other Ambulatory Visit: Payer: Self-pay

## 2018-10-15 ENCOUNTER — Encounter (HOSPITAL_COMMUNITY): Payer: Self-pay | Admitting: Hematology

## 2018-10-15 ENCOUNTER — Inpatient Hospital Stay (HOSPITAL_COMMUNITY): Payer: Medicare Other | Attending: Hematology | Admitting: Hematology

## 2018-10-15 ENCOUNTER — Inpatient Hospital Stay (HOSPITAL_COMMUNITY): Payer: Medicare Other

## 2018-10-15 VITALS — BP 149/49 | HR 96 | Temp 97.8°F | Resp 18 | Wt 241.8 lb

## 2018-10-15 DIAGNOSIS — Z923 Personal history of irradiation: Secondary | ICD-10-CM | POA: Diagnosis not present

## 2018-10-15 DIAGNOSIS — Z87891 Personal history of nicotine dependence: Secondary | ICD-10-CM | POA: Diagnosis not present

## 2018-10-15 DIAGNOSIS — G479 Sleep disorder, unspecified: Secondary | ICD-10-CM | POA: Insufficient documentation

## 2018-10-15 DIAGNOSIS — L89899 Pressure ulcer of other site, unspecified stage: Secondary | ICD-10-CM | POA: Diagnosis not present

## 2018-10-15 DIAGNOSIS — C189 Malignant neoplasm of colon, unspecified: Secondary | ICD-10-CM

## 2018-10-15 DIAGNOSIS — R200 Anesthesia of skin: Secondary | ICD-10-CM | POA: Diagnosis not present

## 2018-10-15 DIAGNOSIS — C7800 Secondary malignant neoplasm of unspecified lung: Secondary | ICD-10-CM | POA: Insufficient documentation

## 2018-10-15 DIAGNOSIS — Z9221 Personal history of antineoplastic chemotherapy: Secondary | ICD-10-CM | POA: Diagnosis not present

## 2018-10-15 DIAGNOSIS — Z8249 Family history of ischemic heart disease and other diseases of the circulatory system: Secondary | ICD-10-CM | POA: Insufficient documentation

## 2018-10-15 DIAGNOSIS — Z79899 Other long term (current) drug therapy: Secondary | ICD-10-CM | POA: Diagnosis not present

## 2018-10-15 DIAGNOSIS — Z820 Family history of epilepsy and other diseases of the nervous system: Secondary | ICD-10-CM | POA: Insufficient documentation

## 2018-10-15 DIAGNOSIS — R197 Diarrhea, unspecified: Secondary | ICD-10-CM | POA: Insufficient documentation

## 2018-10-15 DIAGNOSIS — I700 Atherosclerosis of aorta: Secondary | ICD-10-CM | POA: Insufficient documentation

## 2018-10-15 LAB — CBC WITH DIFFERENTIAL/PLATELET
Abs Immature Granulocytes: 0.03 10*3/uL (ref 0.00–0.07)
Basophils Absolute: 0.1 10*3/uL (ref 0.0–0.1)
Basophils Relative: 1 %
Eosinophils Absolute: 0.2 10*3/uL (ref 0.0–0.5)
Eosinophils Relative: 2 %
HCT: 38.4 % — ABNORMAL LOW (ref 39.0–52.0)
Hemoglobin: 12.2 g/dL — ABNORMAL LOW (ref 13.0–17.0)
Immature Granulocytes: 0 %
Lymphocytes Relative: 17 %
Lymphs Abs: 1.7 10*3/uL (ref 0.7–4.0)
MCH: 31.8 pg (ref 26.0–34.0)
MCHC: 31.8 g/dL (ref 30.0–36.0)
MCV: 100 fL (ref 80.0–100.0)
Monocytes Absolute: 0.8 10*3/uL (ref 0.1–1.0)
Monocytes Relative: 8 %
Neutro Abs: 6.9 10*3/uL (ref 1.7–7.7)
Neutrophils Relative %: 72 %
Platelets: 256 10*3/uL (ref 150–400)
RBC: 3.84 MIL/uL — ABNORMAL LOW (ref 4.22–5.81)
RDW: 14.8 % (ref 11.5–15.5)
WBC: 9.7 10*3/uL (ref 4.0–10.5)
nRBC: 0 % (ref 0.0–0.2)

## 2018-10-15 LAB — COMPREHENSIVE METABOLIC PANEL
ALT: 28 U/L (ref 0–44)
AST: 27 U/L (ref 15–41)
Albumin: 3.4 g/dL — ABNORMAL LOW (ref 3.5–5.0)
Alkaline Phosphatase: 73 U/L (ref 38–126)
Anion gap: 7 (ref 5–15)
BUN: 20 mg/dL (ref 8–23)
CO2: 20 mmol/L — ABNORMAL LOW (ref 22–32)
Calcium: 8.2 mg/dL — ABNORMAL LOW (ref 8.9–10.3)
Chloride: 111 mmol/L (ref 98–111)
Creatinine, Ser: 0.83 mg/dL (ref 0.61–1.24)
GFR calc Af Amer: >60 mL/min (ref >60)
GFR calc non Af Amer: >60 mL/min (ref >60)
Glucose, Bld: 78 mg/dL (ref 70–99)
Potassium: 4.8 mmol/L (ref 3.5–5.1)
Sodium: 138 mmol/L (ref 135–145)
Total Bilirubin: 1.1 mg/dL (ref 0.3–1.2)
Total Protein: 6.8 g/dL (ref 6.5–8.1)

## 2018-10-15 LAB — MAGNESIUM: Magnesium: 2 mg/dL (ref 1.7–2.4)

## 2018-10-15 MED ORDER — HEPARIN SOD (PORK) LOCK FLUSH 100 UNIT/ML IV SOLN
500.0000 [IU] | Freq: Once | INTRAVENOUS | Status: AC
  Administered 2018-10-15: 500 [IU] via INTRAVENOUS

## 2018-10-15 MED ORDER — SODIUM CHLORIDE 0.9% FLUSH
10.0000 mL | Freq: Once | INTRAVENOUS | Status: AC
  Administered 2018-10-15: 15:00:00 10 mL via INTRAVENOUS

## 2018-10-15 NOTE — Progress Notes (Signed)
Correction-No blood return noted with port flush.  Patient denied pain with flush.  No bruising or swelling noted at site.

## 2018-10-15 NOTE — Patient Instructions (Addendum)
[place] at [facility] Discharge Instructions  You were seen today by Dr. Delton Coombes. He went over your recent lab results. He will get you scheduled for CT scan prior to your next visit. He will see you back in 2 months for labs and follow up.   Thank you for choosing [place] at [facility] to provide your oncology and hematology care.  To afford each patient quality time with our provider, please arrive at least 15 minutes before your scheduled appointment time.   If you have a lab appointment with the [facility] please come in thru the  Main Entrance and check in at the main information desk  You need to re-schedule your appointment should you arrive 10 or more minutes late.  We strive to give you quality time with our providers, and arriving late affects you and other patients whose appointments are after yours.  Also, if you no show three or more times for appointments you may be dismissed from the clinic at the providers discretion.     Again, thank you for choosing [facility].  Our hope is that these requests will decrease the amount of time that you wait before being seen by our physicians.       _____________________________________________________________  Should you have questions after your visit to [facility], please contact our office at [phone] between the hours of 8:00 a.m. and 4:30 p.m.  Voicemails left after 4:00 p.m. will not be returned until the following business day.  For prescription refill requests, have your pharmacy contact our office and allow 72 hours.    Cancer Center Support Programs:   > Cancer Support Group  2nd Tuesday of the month 1pm-2pm, Journey Room

## 2018-10-15 NOTE — Progress Notes (Signed)
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  CURRENT THERAPY: Stivarga 80 mg 3 weeks on 1 week off.  BRIEF ONCOLOGIC HISTORY:  Oncology History  Adenocarcinoma of colon ([place])  10/04/2012 Definitive Surgery   Partial collectomy by Dr. [name]   10/04/2012 Pathology Results   4.5 cm poorly differentiated adenocarcinoma, negative margins, 0/7 lymph nodes for metastatic disease.  Oncotype recurrence score of 26 (high)    Chemotherapy   FOLFOX    Adverse Reaction   Progressive peripheral neuropathy    Chemotherapy   Infusional 5 FU.  Completed 6 months worth of adjuvant therapy.   02/23/2014 Procedure   Colonoscopy by Dr. Anthony Sar.   07/01/2015 PET scan   There is malignant range uptake with RLL pulmonary nodule suspicious for metastatic disease or primary pulm neoplasm. Nonspecific focus of uptake in the L femoral neck. No corresponding CT abnormality. Cannot rule out metastasis.   07/06/2015 Imaging   MR C-spine- C5-6 there is a broad-based disc bulge w R paracentral disc protrusion deforming the spinal cord. Moderate R foraminal stenosis. C6-7 there is a central disc protrusion slightly eccentric towards the R and mildly deforming the ventral c-spine    09/16/2015 Procedure   RLL needle biopsy by IR.   09/17/2015 Pathology Results   Metastatic adenocarcinoma consistent with a primary colorectal adenocarcinoma.   10/12/2015 - 10/22/2015 Radiation Therapy   SBRT by Dr. [name] to RLL pulmonary lesion (biopsy proven to be oligometastasis).   12/09/2015 Imaging   CT chest- 1. Slight interval decrease in size of the right lower lobe pulmonary nodule. Some surrounding radiation changes. 2. No mediastinal or hilar mass or adenopathy. 3. No new pulmonary nodules. 4.  Stable underline emphysematous changes. 5. Cirrhotic changes involving the liver but no upper abdominal metastatic disease.   03/14/2016 Imaging   CT chest- 1. Continued mild reduction in the size of the treated right lobe pulmonary nodule. 2. Increased patchy consolidation, reticulation and ground-glass attenuation in the basilar right lower lobe, nonspecific, favor evolving postradiation change, recommend attention on follow-up chest CT. 3. No evidence of new or progressive metastatic disease in the chest. 4. Aortic atherosclerosis. Left main and 3 vessel coronary atherosclerosis. 5. Mild emphysema.   06/30/2016 Imaging   CT CAP- 1. The appearance of the treated right lower lobe nodule is essentially unchanged, and there are evolving postradiation changes in the base of the right lower lobe, as above. No definite signs of new metastatic disease are noted elsewhere in the chest, abdomen or pelvis. 2. Aortic atherosclerosis, in addition to left main and 3 vessel coronary artery disease. Assessment for potential risk factor modification, dietary therapy or pharmacologic therapy may be warranted, if clinically indicated. 3. Morphologic changes in the liver suggestive of cirrhosis, as above. 4. Additional incidental findings, similar prior studies, as above.   11/17/2016 Imaging   CT CAP Study Result   CLINICAL DATA:  Stage IV colon cancer originally diagnosed in August 2014 status post partial colectomy, with right lower lobe lung metastasis diagnosed August 2017 status post SBRT completed 10/22/2015. Restaging.  EXAM: CT CHEST, ABDOMEN, AND PELVIS WITH CONTRAST  TECHNIQUE: Multidetector CT imaging of the chest, abdomen and pelvis was performed following the standard protocol during bolus administration of intravenous contrast.  CONTRAST:  139m ISOVUE-300 IOPAMIDOL (ISOVUE-300) INJECTION 61%  COMPARISON:  06/30/2016 CT chest, abdomen and pelvis.  FINDINGS: CT  CHEST FINDINGS  Cardiovascular: Normal heart size. No significant pericardial fluid/thickening. Left main, left anterior descending, left circumflex and right coronary atherosclerosis. Atherosclerotic nonaneurysmal thoracic aorta. Normal caliber pulmonary arteries. No central pulmonary emboli. Right internal jugular MediPort terminates at the junction of the right and left brachiocephalic veins.  Mediastinum/Nodes: No discrete thyroid nodules. Unremarkable esophagus. No pathologically enlarged axillary, mediastinal or hilar lymph nodes.  Lungs/Pleura: No pneumothorax. No pleural effusion. There is a nodular 2.7 x 1.6 cm focus of consolidation in the medial basilar right lower lobe (series 3/ image 108), previously 1.6 x 1.1 cm. No appreciable change in patchy subpleural reticulation in both lungs without significant traction bronchiectasis or frank honeycombing. Mild centrilobular and paraseptal emphysema with mild diffuse bronchial wall thickening. No acute consolidative airspace disease or new significant pulmonary nodules.  Musculoskeletal: No aggressive appearing focal osseous lesions. Moderate thoracic spondylosis. Stable chronic left scapular deformity.  CT ABDOMEN PELVIS FINDINGS  Hepatobiliary: Normal liver with no liver mass. Cholecystectomy. No biliary ductal dilatation.  Pancreas: Normal, with no mass or duct dilation.  Spleen: Normal size. No mass.  Adrenals/Urinary Tract: Normal adrenals. No hydronephrosis. No renal masses. Normal bladder.  Stomach/Bowel: Grossly normal stomach. Normal caliber small bowel with no small bowel wall thickening. Stable appearance of the appendix, within normal limits. Stable postsurgical changes from partial distal colectomy with intact appearing distal colonic anastomosis. Oral contrast transits to the cecum. No large bowel wall thickening or new pericholecystic fat stranding .  Vascular/Lymphatic: Atherosclerotic  nonaneurysmal abdominal aorta. Patent portal, splenic, hepatic and renal veins. No pathologically enlarged lymph nodes in the abdomen or pelvis.  Reproductive:  Normal size prostate.  Other: No pneumoperitoneum, ascites or focal fluid collection. Stable subcutaneous calcified granulomas throughout the left lower back and gluteal regions.  Musculoskeletal: No aggressive appearing focal osseous lesions. Marked lumbar spondylosis.  IMPRESSION: 1. Nodular focus of consolidation in the medial basilar right lower lobe is increased in size. While this may represent evolving masslike radiation fibrosis, recurrence of the lung metastasis is not excluded. PET-CT would be useful for further evaluation. Otherwise, continued close chest CT surveillance is advised . 2. No additional potential new sites of metastatic disease in the chest, abdomen or pelvis. 3. No evidence of local tumor recurrence at the distal colonic anastomosis. 4. Left main and 3 vessel coronary atherosclerosis . 5. Aortic Atherosclerosis (ICD10-I70.0) and Emphysema (ICD10-J43.9).      11/30/2016 Relapse/Recurrence   PET: IMPRESSION: 1. Enlarging right lower lobe pulmonary nodule and slight increase in the SUV max suggesting residual neoplasm. No new pulmonary lesions. 2. No findings for local recurrence or abdominal/pelvic metastatic disease.   01/02/2017 - 01/12/2017 Radiation Therapy    The patient saw Dr. MLisbeth Renshawfor SBRT radiation treatment. The tumor in the right lung was treated with a course of stereotactic body radiation treatment. The patient received 50 Gy In 5 fractions at 10 Gy per fraction.      10/05/2017 -  Chemotherapy   The patient had bevacizumab (AVASTIN) 900 mg in sodium chloride 0.9 % 100 mL chemo infusion, 7.3 mg/kg = 925 mg, Intravenous,  Once, 1 of 3 cycles Administration: 900 mg (10/05/2017)  for chemotherapy treatment.       CANCER STAGING: Cancer Staging Adenocarcinoma of colon  ([facility] Staging form: Colon and Rectum, AJCC 7th Edition - Clinical stage from 10/04/2012: Stage IIA (T3, N0, M0) - Signed by KSheldon Silvan  Manon Hilding, PA-C on 07/15/2015 - Pathologic stage from 09/17/2015: Stage IVA (T3, N0, M1a) - Signed by Baird Cancer, PA-C on 10/05/2015    INTERVAL HISTORY:  Mr. Killgore 78 y.o. male seen for follow-up of metastatic colon cancer to the lung.  He is taking Stivarga 80 mg 3 weeks on 1 week off without any major GI problems including nausea vomiting or constipation.  His baseline diarrhea has been stable.  Numbness in the feet is also stable.  He complains of sore in the posterior right thigh region for the last few weeks.  He is mostly sitting in a chair or in a recliner.  He walks with the help of walker but very minimally.  Denies any skin peeling or sores in the hands.  Appetite is 100%.  Energy levels are 25%.    REVIEW OF SYSTEMS:  Review of Systems  Gastrointestinal: Positive for diarrhea.  Neurological: Positive for numbness.  Psychiatric/Behavioral: Positive for sleep disturbance.  All other systems reviewed and are negative.    PAST MEDICAL/SURGICAL HISTORY:  Past Medical History:  Diagnosis Date  . Adenocarcinoma of colon ([place])    RLL -  Pulmonary nodule (06/2015, concerning for mets vs primary; to see [facility] Dr. [name]); stage 2 adenocarinoma   2014 - Colon Cancer - surgery and chemo  . Blood infection ([place])   . Cystitis   . Diabetes mellitus without complication ([place])    Type II  . DVT (deep venous thrombosis) ([place]) 2014   post op  . Hematuria   . Hypertension   . Neuropathy   . Prostatitis   . Pulmonary nodule   . Sleep apnea    Past Surgical History:  Procedure Laterality Date  . BACK SURGERY    . CATARACT EXTRACTION     left  . CATARACT EXTRACTION W/PHACO Right 04/11/2012   Procedure: CATARACT EXTRACTION PHACO AND INTRAOCULAR LENS PLACEMENT (IOC);  Surgeon: Tonny Branch, MD;  Location: [facility] ORS;  Service: Ophthalmology;  Laterality: Right;   CDE:62.48  . CERVICAL FUSION     c4-c5  . CHOLECYSTECTOMY    . COLON SURGERY  09/29/12   Colon resection for cancer  . COLONOSCOPY    . EYE SURGERY     cataract  . GIVENS CAPSULE STUDY N/A 03/15/2015   Procedure: GIVENS CAPSULE STUDY;  Surgeon: Rogene Houston, MD;  Location: [facility] ENDO SUITE;  Service: Endoscopy;  Laterality: N/A;  730  . LEG SURGERY    . ORIF FEMUR FRACTURE     right  . POSTERIOR CERVICAL FUSION/FORAMINOTOMY N/A 07/27/2015   Procedure: Cervical four to Cervical seven Laminectomy with Cervical four to Thoracic one Dorsal internal fixation and fusion;  Surgeon: Kevan Ny Ditty, MD;  Location: [facility] OR[facility];  Service: Neurosurgery;  Laterality: N/A;  C4 to C7 Laminectomy with C4 to T1 Dorsal internal fixation and fusion     SOCIAL HISTORY:  Social History   Socioeconomic History  . Marital status: Single    Spouse name: Not on file  . Number of children: Not on file  . Years of education: Not on file  . Highest education level: Not on file  Occupational History  . Not on file  Social Needs  . Financial resource strain: Not on file  . Food insecurity    Worry: Not on file    Inability: Not on file  . Transportation needs    Medical: Not on file    Non-medical: Not on file  Tobacco Use  .  Smoking status: Former Smoker    Packs/day: 1.00    Years: 55.00    Pack years: 55.00    Types: Cigarettes    Start date: 02/07/1955  . Smokeless tobacco: Never Used  . Tobacco comment: quit 2015  Substance and Sexual Activity  . Alcohol use: Not Currently    Alcohol/week: 3.0 standard drinks    Types: 3 Cans of beer per week  . Drug use: No  . Sexual activity: Yes    Birth control/protection: None  Lifestyle  . Physical activity    Days per week: Not on file    Minutes per session: Not on file  . Stress: Not on file  Relationships  . Social Herbalist on phone: Not on file    Gets together: Not on file    Attends religious service: Not on file     Active member of club or organization: Not on file    Attends meetings of clubs or organizations: Not on file    Relationship status: Not on file  . Intimate partner violence    Fear of current or ex partner: Not on file    Emotionally abused: Not on file    Physically abused: Not on file    Forced sexual activity: Not on file  Other Topics Concern  . Not on file  Social History Narrative  . Not on file    FAMILY HISTORY:  Family History  Problem Relation Age of Onset  . Heart disease Father   . Heart attack Father   . Alzheimer's disease Mother     CURRENT MEDICATIONS:  Outpatient Encounter Medications as of 10/15/2018  Medication Sig  . aspirin EC 81 MG tablet Take 81 mg by mouth daily.  . furosemide (LASIX) 20 MG tablet Take 20 mg by mouth every other day.   . gabapentin (NEURONTIN) 300 MG capsule Take 2 capsules (600 mg total) by mouth 3 (three) times daily. (Patient taking differently: Take 600 mg by mouth 2 (two) times daily. )  . insulin NPH (HUMULIN N,NOVOLIN N) 100 UNIT/ML injection Inject 15-30 Units into the skin See admin instructions. 25 units every morning and 14 units in the evening  . lisinopril (PRINIVIL,ZESTRIL) 40 MG tablet Take 40 mg by mouth daily.  . naproxen (NAPROSYN) 500 MG tablet TAKE 1 TABLET BY MOUTH TWICE A DAY WITH MEALS  . OXYGEN Inhale 2 L into the lungs continuous. At night time only  . regorafenib (STIVARGA) 40 MG tablet Take 2 tablets (80 mg total) by mouth daily. Take with low fat breakfast. Take for 21days, then hold for 7days. Take as directed.  . silver sulfADIAZINE (SILVADENE) 1 % cream   . atorvastatin (LIPITOR) 10 MG tablet Take 10 mg by mouth daily at 6 PM.   . HYDROcodone-acetaminophen (NORCO/VICODIN) 5-325 MG tablet Take 1 tablet by mouth as needed.   . [EXPIRED] heparin lock flush 100 unit/mL   . [EXPIRED] sodium chloride flush (NS) 0.9 % injection 10 mL    No facility-administered encounter medications on file as of 10/15/2018.      ALLERGIES:  No Known Allergies   PHYSICAL EXAM:  ECOG Performance status: 2  Vitals:   10/15/18 1505  BP: (!) 149/49  Pulse: 96  Resp: 18  Temp: 97.8 F (36.6 C)  SpO2: 97%   Filed Weights   10/15/18 1505  Weight: 241 lb 12.8 oz (109.7 kg)    Physical Exam Vitals signs reviewed.  Constitutional:  Appearance: Normal appearance.  Cardiovascular:     Rate and Rhythm: Normal rate and regular rhythm.     Heart sounds: Normal heart sounds.  Pulmonary:     Effort: Pulmonary effort is normal.     Breath sounds: Normal breath sounds.  Abdominal:     General: There is no distension.     Palpations: Abdomen is soft. There is no mass.  Musculoskeletal:        General: No swelling.  Neurological:     General: No focal deficit present.     Mental Status: He is alert and oriented to person, place, and time.  Psychiatric:        Mood and Affect: Mood normal.        Behavior: Behavior normal.   1 cm sore in the posterior right medial thigh with mild surrounding erythema.   LABORATORY DATA:  I have reviewed the labs as listed.  CBC    Component Value Date/Time   WBC 9.7 10/15/2018 1508   RBC 3.84 (L) 10/15/2018 1508   HGB 12.2 (L) 10/15/2018 1508   HCT 38.4 (L) 10/15/2018 1508   PLT 256 10/15/2018 1508   MCV 100.0 10/15/2018 1508   MCH 31.8 10/15/2018 1508   MCHC 31.8 10/15/2018 1508   RDW 14.8 10/15/2018 1508   LYMPHSABS 1.7 10/15/2018 1508   MONOABS 0.8 10/15/2018 1508   EOSABS 0.2 10/15/2018 1508   BASOSABS 0.1 10/15/2018 1508   CMP Latest Ref Rng & Units 10/15/2018 07/25/2018 06/28/2018  Glucose 70 - 99 mg/dL 78 94 77  BUN 8 - 23 mg/dL '20 20 17  '$ Creatinine 0.61 - 1.24 mg/dL 0.83 0.97 0.81  Sodium 135 - 145 mmol/L 138 139 139  Potassium 3.5 - 5.1 mmol/L 4.8 4.9 5.1  Chloride 98 - 111 mmol/L 111 109 111  CO2 22 - 32 mmol/L 20(L) 24 22  Calcium 8.9 - 10.3 mg/dL 8.2(L) 8.2(L) 8.4(L)  Total Protein 6.5 - 8.1 g/dL 6.8 6.5 6.3(L)  Total Bilirubin 0.3 - 1.2 mg/dL  1.1 0.9 0.9  Alkaline Phos 38 - 126 U/L 73 69 67  AST 15 - 41 U/L '27 28 29  '$ ALT 0 - 44 U/L '28 27 26       '$ DIAGNOSTIC IMAGING:  I have independently reviewed the scans and discussed with the patient.      ASSESSMENT & PLAN:   Adenocarcinoma of colon ([place]) 1.  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 -Lung needle biopsy consistent with metastatic adenocarcinoma on 09/16/2015. - BRAF [id number] mutation positive. -Status post SBRT to the right lower lobe lung lesion on 10/12/2015 by Dr. Genia Harold. - PET scan in July 2019 showed hypermetabolism in the right lower lobe lung.  He was treated with Xeloda in August 2019 and developed severe exfoliating rash in the groin and buttocks and could not tolerate it.  He also received 1 dose of bevacizumab. -CAT scan on 01/15/2018 showed similar size and metabolic activity in the right lower lobe concerning for residual carcinoma.  No other evidence of metastatic disease. -Stivarga 80 mg 3 weeks on/1 week off started in February 2020. -CT of the chest with contrast on 07/20/2018 showed grossly stable area of presumed postradiation change in the medial posterior right lower lobe with similar areas of surrounding atelectasis and/or scarring.  No new pulmonary nodules seen. - Last CEA level on 07/25/2018 was 4.1.  Today's labs including CBC  and CMP were grossly within normal limits. -He does not have any problems with Stivarga.  He will come back in 2 months for follow-up.  I plan to repeat CT CAP prior to next visit.  2.  Diarrhea: - He does have diarrhea after eating, predominantly before noon time.  He is taking Imodium as needed.  3.  Right upper thigh pressure sore: -He has about 1 cm sized sore in the upper medial aspect of the right thigh posteriorly. -Patient mostly sits in his recliner.  He walks minimally with a walker. -I will make referral to wound care  center.  Total time spent is 25 minutes with more than 50% of the time spent face-to-face discussing treatment plan and coordination of care.    Orders placed this encounter:  Orders Placed This Encounter  Procedures  . CT Abdomen Pelvis W Contrast  . CT Chest W Contrast      Derek Jack, MD [place] [phone]

## 2018-10-15 NOTE — Assessment & Plan Note (Signed)
1.  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 -Lung needle biopsy consistent with metastatic adenocarcinoma on 09/16/2015. - BRAF [id number] mutation positive. -Status post SBRT to the right lower lobe lung lesion on 10/12/2015 by Dr. Genia Harold. - PET scan in July 2019 showed hypermetabolism in the right lower lobe lung.  He was treated with Xeloda in August 2019 and developed severe exfoliating rash in the groin and buttocks and could not tolerate it.  He also received 1 dose of bevacizumab. -CAT scan on 01/15/2018 showed similar size and metabolic activity in the right lower lobe concerning for residual carcinoma.  No other evidence of metastatic disease. -Stivarga 80 mg 3 weeks on/1 week off started in February 2020. -CT of the chest with contrast on 07/20/2018 showed grossly stable area of presumed postradiation change in the medial posterior right lower lobe with similar areas of surrounding atelectasis and/or scarring.  No new pulmonary nodules seen. - Last CEA level on 07/25/2018 was 4.1.  Today's labs including CBC and CMP were grossly within normal limits. -He does not have any problems with Stivarga.  He will come back in 2 months for follow-up.  I plan to repeat CT CAP prior to next visit.  2.  Diarrhea: - He does have diarrhea after eating, predominantly before noon time.  He is taking Imodium as needed.  3.  Right upper thigh pressure sore: -He has about 1 cm sized sore in the upper medial aspect of the right thigh posteriorly. -Patient mostly sits in his recliner.  He walks minimally with a walker. -I will make referral to wound care center.

## 2018-10-15 NOTE — Progress Notes (Signed)
Patients port flushed without difficulty.  Good blood return noted with no bruising or swelling noted at site.  Band aid applied.  VSS with discharge and left ambulatory with no s/s of distress noted.  

## 2018-10-16 LAB — CEA: CEA: 5.6 ng/mL — ABNORMAL HIGH (ref 0.0–4.7)

## 2018-10-25 DIAGNOSIS — Z85038 Personal history of other malignant neoplasm of large intestine: Secondary | ICD-10-CM | POA: Diagnosis not present

## 2018-10-25 DIAGNOSIS — I10 Essential (primary) hypertension: Secondary | ICD-10-CM | POA: Diagnosis not present

## 2018-10-25 DIAGNOSIS — L89899 Pressure ulcer of other site, unspecified stage: Secondary | ICD-10-CM | POA: Diagnosis not present

## 2018-10-25 DIAGNOSIS — Z79899 Other long term (current) drug therapy: Secondary | ICD-10-CM | POA: Diagnosis not present

## 2018-10-25 DIAGNOSIS — L89159 Pressure ulcer of sacral region, unspecified stage: Secondary | ICD-10-CM | POA: Diagnosis not present

## 2018-10-25 DIAGNOSIS — Z7982 Long term (current) use of aspirin: Secondary | ICD-10-CM | POA: Diagnosis not present

## 2018-10-29 DIAGNOSIS — I10 Essential (primary) hypertension: Secondary | ICD-10-CM | POA: Diagnosis not present

## 2018-10-29 DIAGNOSIS — Z981 Arthrodesis status: Secondary | ICD-10-CM | POA: Diagnosis not present

## 2018-10-29 DIAGNOSIS — Z794 Long term (current) use of insulin: Secondary | ICD-10-CM | POA: Diagnosis not present

## 2018-10-29 DIAGNOSIS — Z85038 Personal history of other malignant neoplasm of large intestine: Secondary | ICD-10-CM | POA: Diagnosis not present

## 2018-10-29 DIAGNOSIS — E662 Morbid (severe) obesity with alveolar hypoventilation: Secondary | ICD-10-CM | POA: Diagnosis not present

## 2018-10-29 DIAGNOSIS — C7800 Secondary malignant neoplasm of unspecified lung: Secondary | ICD-10-CM | POA: Diagnosis not present

## 2018-10-29 DIAGNOSIS — D630 Anemia in neoplastic disease: Secondary | ICD-10-CM | POA: Diagnosis not present

## 2018-10-29 DIAGNOSIS — E119 Type 2 diabetes mellitus without complications: Secondary | ICD-10-CM | POA: Diagnosis not present

## 2018-10-29 DIAGNOSIS — M160 Bilateral primary osteoarthritis of hip: Secondary | ICD-10-CM | POA: Diagnosis not present

## 2018-10-29 DIAGNOSIS — L89312 Pressure ulcer of right buttock, stage 2: Secondary | ICD-10-CM | POA: Diagnosis not present

## 2018-10-29 DIAGNOSIS — L89892 Pressure ulcer of other site, stage 2: Secondary | ICD-10-CM | POA: Diagnosis not present

## 2018-10-29 DIAGNOSIS — M4802 Spinal stenosis, cervical region: Secondary | ICD-10-CM | POA: Diagnosis not present

## 2018-11-01 DIAGNOSIS — L89312 Pressure ulcer of right buttock, stage 2: Secondary | ICD-10-CM | POA: Diagnosis not present

## 2018-11-01 DIAGNOSIS — L89892 Pressure ulcer of other site, stage 2: Secondary | ICD-10-CM | POA: Diagnosis not present

## 2018-11-01 DIAGNOSIS — Z794 Long term (current) use of insulin: Secondary | ICD-10-CM | POA: Diagnosis not present

## 2018-11-01 DIAGNOSIS — I10 Essential (primary) hypertension: Secondary | ICD-10-CM | POA: Diagnosis not present

## 2018-11-01 DIAGNOSIS — E119 Type 2 diabetes mellitus without complications: Secondary | ICD-10-CM | POA: Diagnosis not present

## 2018-11-01 DIAGNOSIS — C7800 Secondary malignant neoplasm of unspecified lung: Secondary | ICD-10-CM | POA: Diagnosis not present

## 2018-11-05 DIAGNOSIS — Z794 Long term (current) use of insulin: Secondary | ICD-10-CM | POA: Diagnosis not present

## 2018-11-05 DIAGNOSIS — L89892 Pressure ulcer of other site, stage 2: Secondary | ICD-10-CM | POA: Diagnosis not present

## 2018-11-05 DIAGNOSIS — I10 Essential (primary) hypertension: Secondary | ICD-10-CM | POA: Diagnosis not present

## 2018-11-05 DIAGNOSIS — E119 Type 2 diabetes mellitus without complications: Secondary | ICD-10-CM | POA: Diagnosis not present

## 2018-11-05 DIAGNOSIS — L89312 Pressure ulcer of right buttock, stage 2: Secondary | ICD-10-CM | POA: Diagnosis not present

## 2018-11-05 DIAGNOSIS — C7800 Secondary malignant neoplasm of unspecified lung: Secondary | ICD-10-CM | POA: Diagnosis not present

## 2018-11-06 DIAGNOSIS — L89312 Pressure ulcer of right buttock, stage 2: Secondary | ICD-10-CM | POA: Diagnosis not present

## 2018-11-06 DIAGNOSIS — E119 Type 2 diabetes mellitus without complications: Secondary | ICD-10-CM | POA: Diagnosis not present

## 2018-11-06 DIAGNOSIS — C7800 Secondary malignant neoplasm of unspecified lung: Secondary | ICD-10-CM | POA: Diagnosis not present

## 2018-11-06 DIAGNOSIS — I10 Essential (primary) hypertension: Secondary | ICD-10-CM | POA: Diagnosis not present

## 2018-11-06 DIAGNOSIS — L89892 Pressure ulcer of other site, stage 2: Secondary | ICD-10-CM | POA: Diagnosis not present

## 2018-11-06 DIAGNOSIS — Z794 Long term (current) use of insulin: Secondary | ICD-10-CM | POA: Diagnosis not present

## 2018-11-08 DIAGNOSIS — L89899 Pressure ulcer of other site, unspecified stage: Secondary | ICD-10-CM | POA: Diagnosis not present

## 2018-11-08 DIAGNOSIS — L89312 Pressure ulcer of right buttock, stage 2: Secondary | ICD-10-CM | POA: Diagnosis not present

## 2018-11-15 DIAGNOSIS — L89309 Pressure ulcer of unspecified buttock, unspecified stage: Secondary | ICD-10-CM | POA: Diagnosis not present

## 2018-11-15 DIAGNOSIS — L89312 Pressure ulcer of right buttock, stage 2: Secondary | ICD-10-CM | POA: Diagnosis not present

## 2018-11-15 DIAGNOSIS — L89899 Pressure ulcer of other site, unspecified stage: Secondary | ICD-10-CM | POA: Diagnosis not present

## 2018-11-15 DIAGNOSIS — L8990 Pressure ulcer of unspecified site, unspecified stage: Secondary | ICD-10-CM | POA: Diagnosis not present

## 2018-11-18 DIAGNOSIS — Z794 Long term (current) use of insulin: Secondary | ICD-10-CM | POA: Diagnosis not present

## 2018-11-18 DIAGNOSIS — C7800 Secondary malignant neoplasm of unspecified lung: Secondary | ICD-10-CM | POA: Diagnosis not present

## 2018-11-18 DIAGNOSIS — E119 Type 2 diabetes mellitus without complications: Secondary | ICD-10-CM | POA: Diagnosis not present

## 2018-11-18 DIAGNOSIS — L89892 Pressure ulcer of other site, stage 2: Secondary | ICD-10-CM | POA: Diagnosis not present

## 2018-11-18 DIAGNOSIS — I10 Essential (primary) hypertension: Secondary | ICD-10-CM | POA: Diagnosis not present

## 2018-11-18 DIAGNOSIS — L89312 Pressure ulcer of right buttock, stage 2: Secondary | ICD-10-CM | POA: Diagnosis not present

## 2018-11-25 ENCOUNTER — Other Ambulatory Visit (HOSPITAL_COMMUNITY): Payer: Self-pay | Admitting: *Deleted

## 2018-11-25 DIAGNOSIS — C189 Malignant neoplasm of colon, unspecified: Secondary | ICD-10-CM

## 2018-11-25 DIAGNOSIS — E119 Type 2 diabetes mellitus without complications: Secondary | ICD-10-CM | POA: Diagnosis not present

## 2018-11-25 DIAGNOSIS — C7800 Secondary malignant neoplasm of unspecified lung: Secondary | ICD-10-CM | POA: Diagnosis not present

## 2018-11-25 DIAGNOSIS — Z794 Long term (current) use of insulin: Secondary | ICD-10-CM | POA: Diagnosis not present

## 2018-11-25 DIAGNOSIS — I10 Essential (primary) hypertension: Secondary | ICD-10-CM | POA: Diagnosis not present

## 2018-11-25 DIAGNOSIS — L89312 Pressure ulcer of right buttock, stage 2: Secondary | ICD-10-CM | POA: Diagnosis not present

## 2018-11-25 DIAGNOSIS — L89892 Pressure ulcer of other site, stage 2: Secondary | ICD-10-CM | POA: Diagnosis not present

## 2018-11-25 MED ORDER — REGORAFENIB 40 MG PO TABS: 80.0000 mg | ORAL_TABLET | Freq: Every day | ORAL | 2 refills | Status: DC

## 2018-11-25 NOTE — Telephone Encounter (Signed)
Chart reviewed, stivarga refilled.

## 2018-11-26 ENCOUNTER — Telehealth (HOSPITAL_COMMUNITY): Payer: Self-pay | Admitting: Hematology

## 2018-11-26 NOTE — Telephone Encounter (Signed)
Faxed Stivarga script to [facility]

## 2018-11-28 DIAGNOSIS — M4802 Spinal stenosis, cervical region: Secondary | ICD-10-CM | POA: Diagnosis not present

## 2018-11-28 DIAGNOSIS — Z794 Long term (current) use of insulin: Secondary | ICD-10-CM | POA: Diagnosis not present

## 2018-11-28 DIAGNOSIS — E119 Type 2 diabetes mellitus without complications: Secondary | ICD-10-CM | POA: Diagnosis not present

## 2018-11-28 DIAGNOSIS — I10 Essential (primary) hypertension: Secondary | ICD-10-CM | POA: Diagnosis not present

## 2018-11-28 DIAGNOSIS — L89892 Pressure ulcer of other site, stage 2: Secondary | ICD-10-CM | POA: Diagnosis not present

## 2018-11-28 DIAGNOSIS — D630 Anemia in neoplastic disease: Secondary | ICD-10-CM | POA: Diagnosis not present

## 2018-11-28 DIAGNOSIS — E662 Morbid (severe) obesity with alveolar hypoventilation: Secondary | ICD-10-CM | POA: Diagnosis not present

## 2018-11-28 DIAGNOSIS — C7800 Secondary malignant neoplasm of unspecified lung: Secondary | ICD-10-CM | POA: Diagnosis not present

## 2018-11-28 DIAGNOSIS — Z85038 Personal history of other malignant neoplasm of large intestine: Secondary | ICD-10-CM | POA: Diagnosis not present

## 2018-11-28 DIAGNOSIS — Z981 Arthrodesis status: Secondary | ICD-10-CM | POA: Diagnosis not present

## 2018-11-28 DIAGNOSIS — L89312 Pressure ulcer of right buttock, stage 2: Secondary | ICD-10-CM | POA: Diagnosis not present

## 2018-11-28 DIAGNOSIS — M160 Bilateral primary osteoarthritis of hip: Secondary | ICD-10-CM | POA: Diagnosis not present

## 2018-12-02 DIAGNOSIS — E119 Type 2 diabetes mellitus without complications: Secondary | ICD-10-CM | POA: Diagnosis not present

## 2018-12-02 DIAGNOSIS — L89312 Pressure ulcer of right buttock, stage 2: Secondary | ICD-10-CM | POA: Diagnosis not present

## 2018-12-02 DIAGNOSIS — I10 Essential (primary) hypertension: Secondary | ICD-10-CM | POA: Diagnosis not present

## 2018-12-02 DIAGNOSIS — Z794 Long term (current) use of insulin: Secondary | ICD-10-CM | POA: Diagnosis not present

## 2018-12-02 DIAGNOSIS — L89892 Pressure ulcer of other site, stage 2: Secondary | ICD-10-CM | POA: Diagnosis not present

## 2018-12-02 DIAGNOSIS — C7800 Secondary malignant neoplasm of unspecified lung: Secondary | ICD-10-CM | POA: Diagnosis not present

## 2018-12-06 DIAGNOSIS — L89312 Pressure ulcer of right buttock, stage 2: Secondary | ICD-10-CM | POA: Diagnosis not present

## 2018-12-06 DIAGNOSIS — L89899 Pressure ulcer of other site, unspecified stage: Secondary | ICD-10-CM | POA: Diagnosis not present

## 2018-12-09 DIAGNOSIS — C7800 Secondary malignant neoplasm of unspecified lung: Secondary | ICD-10-CM | POA: Diagnosis not present

## 2018-12-09 DIAGNOSIS — L89892 Pressure ulcer of other site, stage 2: Secondary | ICD-10-CM | POA: Diagnosis not present

## 2018-12-09 DIAGNOSIS — Z794 Long term (current) use of insulin: Secondary | ICD-10-CM | POA: Diagnosis not present

## 2018-12-09 DIAGNOSIS — I10 Essential (primary) hypertension: Secondary | ICD-10-CM | POA: Diagnosis not present

## 2018-12-09 DIAGNOSIS — L89312 Pressure ulcer of right buttock, stage 2: Secondary | ICD-10-CM | POA: Diagnosis not present

## 2018-12-09 DIAGNOSIS — E119 Type 2 diabetes mellitus without complications: Secondary | ICD-10-CM | POA: Diagnosis not present

## 2018-12-10 ENCOUNTER — Other Ambulatory Visit: Payer: Self-pay

## 2018-12-10 ENCOUNTER — Inpatient Hospital Stay (HOSPITAL_COMMUNITY): Payer: Medicare Other | Attending: Hematology

## 2018-12-10 ENCOUNTER — Ambulatory Visit (HOSPITAL_COMMUNITY)
Admission: RE | Admit: 2018-12-10 | Discharge: 2018-12-10 | Disposition: A | Discharge status: Home / Self Care | Payer: Medicare Other | Source: Ambulatory Visit | Attending: Hematology | Admitting: Hematology

## 2018-12-10 DIAGNOSIS — Z818 Family history of other mental and behavioral disorders: Secondary | ICD-10-CM | POA: Insufficient documentation

## 2018-12-10 DIAGNOSIS — C7801 Secondary malignant neoplasm of right lung: Secondary | ICD-10-CM | POA: Diagnosis not present

## 2018-12-10 DIAGNOSIS — I700 Atherosclerosis of aorta: Secondary | ICD-10-CM | POA: Insufficient documentation

## 2018-12-10 DIAGNOSIS — Z8249 Family history of ischemic heart disease and other diseases of the circulatory system: Secondary | ICD-10-CM | POA: Insufficient documentation

## 2018-12-10 DIAGNOSIS — Z923 Personal history of irradiation: Secondary | ICD-10-CM | POA: Insufficient documentation

## 2018-12-10 DIAGNOSIS — C19 Malignant neoplasm of rectosigmoid junction: Secondary | ICD-10-CM | POA: Insufficient documentation

## 2018-12-10 DIAGNOSIS — G479 Sleep disorder, unspecified: Secondary | ICD-10-CM | POA: Diagnosis not present

## 2018-12-10 DIAGNOSIS — R197 Diarrhea, unspecified: Secondary | ICD-10-CM | POA: Insufficient documentation

## 2018-12-10 DIAGNOSIS — Z87891 Personal history of nicotine dependence: Secondary | ICD-10-CM | POA: Diagnosis not present

## 2018-12-10 DIAGNOSIS — Z7289 Other problems related to lifestyle: Secondary | ICD-10-CM | POA: Insufficient documentation

## 2018-12-10 DIAGNOSIS — C189 Malignant neoplasm of colon, unspecified: Secondary | ICD-10-CM | POA: Diagnosis not present

## 2018-12-10 DIAGNOSIS — Z79899 Other long term (current) drug therapy: Secondary | ICD-10-CM | POA: Insufficient documentation

## 2018-12-10 DIAGNOSIS — R200 Anesthesia of skin: Secondary | ICD-10-CM | POA: Diagnosis not present

## 2018-12-10 DIAGNOSIS — J439 Emphysema, unspecified: Secondary | ICD-10-CM | POA: Diagnosis not present

## 2018-12-10 DIAGNOSIS — L89899 Pressure ulcer of other site, unspecified stage: Secondary | ICD-10-CM | POA: Diagnosis not present

## 2018-12-10 DIAGNOSIS — I2510 Atherosclerotic heart disease of native coronary artery without angina pectoris: Secondary | ICD-10-CM | POA: Diagnosis not present

## 2018-12-10 LAB — COMPREHENSIVE METABOLIC PANEL
ALT: 23 U/L (ref 0–44)
AST: 23 U/L (ref 15–41)
Albumin: 3.2 g/dL — ABNORMAL LOW (ref 3.5–5.0)
Alkaline Phosphatase: 70 U/L (ref 38–126)
Anion gap: 7 (ref 5–15)
BUN: 25 mg/dL — ABNORMAL HIGH (ref 8–23)
CO2: 22 mmol/L (ref 22–32)
Calcium: 8 mg/dL — ABNORMAL LOW (ref 8.9–10.3)
Chloride: 108 mmol/L (ref 98–111)
Creatinine, Ser: 1 mg/dL (ref 0.61–1.24)
GFR calc Af Amer: >60 mL/min (ref >60)
GFR calc non Af Amer: >60 mL/min (ref >60)
Glucose, Bld: 87 mg/dL (ref 70–99)
Potassium: 4.9 mmol/L (ref 3.5–5.1)
Sodium: 137 mmol/L (ref 135–145)
Total Bilirubin: 0.4 mg/dL (ref 0.3–1.2)
Total Protein: 6.7 g/dL (ref 6.5–8.1)

## 2018-12-10 LAB — CBC WITH DIFFERENTIAL/PLATELET
Abs Immature Granulocytes: 0.03 10*3/uL (ref 0.00–0.07)
Basophils Absolute: 0.1 10*3/uL (ref 0.0–0.1)
Basophils Relative: 1 %
Eosinophils Absolute: 0.2 10*3/uL (ref 0.0–0.5)
Eosinophils Relative: 3 %
HCT: 38.7 % — ABNORMAL LOW (ref 39.0–52.0)
Hemoglobin: 12.1 g/dL — ABNORMAL LOW (ref 13.0–17.0)
Immature Granulocytes: 0 %
Lymphocytes Relative: 13 %
Lymphs Abs: 1.1 10*3/uL (ref 0.7–4.0)
MCH: 31.2 pg (ref 26.0–34.0)
MCHC: 31.3 g/dL (ref 30.0–36.0)
MCV: 99.7 fL (ref 80.0–100.0)
Monocytes Absolute: 0.7 10*3/uL (ref 0.1–1.0)
Monocytes Relative: 8 %
Neutro Abs: 6.7 10*3/uL (ref 1.7–7.7)
Neutrophils Relative %: 75 %
Platelets: 220 10*3/uL (ref 150–400)
RBC: 3.88 MIL/uL — ABNORMAL LOW (ref 4.22–5.81)
RDW: 14.9 % (ref 11.5–15.5)
WBC: 8.8 10*3/uL (ref 4.0–10.5)
nRBC: 0 % (ref 0.0–0.2)

## 2018-12-10 LAB — MAGNESIUM: Magnesium: 2 mg/dL (ref 1.7–2.4)

## 2018-12-10 MED ORDER — IOHEXOL 300 MG/ML  SOLN
100.0000 mL | Freq: Once | INTRAMUSCULAR | Status: AC | PRN
  Administered 2018-12-10: 100 mL via INTRAVENOUS

## 2018-12-11 LAB — CEA: CEA: 3.6 ng/mL (ref 0.0–4.7)

## 2018-12-13 DIAGNOSIS — L89899 Pressure ulcer of other site, unspecified stage: Secondary | ICD-10-CM | POA: Diagnosis not present

## 2018-12-13 DIAGNOSIS — L89329 Pressure ulcer of left buttock, unspecified stage: Secondary | ICD-10-CM | POA: Diagnosis not present

## 2018-12-17 ENCOUNTER — Other Ambulatory Visit: Payer: Self-pay

## 2018-12-17 ENCOUNTER — Inpatient Hospital Stay (HOSPITAL_BASED_OUTPATIENT_CLINIC_OR_DEPARTMENT_OTHER): Payer: Medicare Other | Admitting: Hematology

## 2018-12-17 ENCOUNTER — Encounter (HOSPITAL_COMMUNITY): Payer: Self-pay | Admitting: Hematology

## 2018-12-17 VITALS — BP 134/47 | HR 95 | Temp 97.1°F | Resp 20 | Wt 237.0 lb

## 2018-12-17 DIAGNOSIS — C189 Malignant neoplasm of colon, unspecified: Secondary | ICD-10-CM | POA: Diagnosis not present

## 2018-12-17 DIAGNOSIS — I2510 Atherosclerotic heart disease of native coronary artery without angina pectoris: Secondary | ICD-10-CM | POA: Diagnosis not present

## 2018-12-17 DIAGNOSIS — R197 Diarrhea, unspecified: Secondary | ICD-10-CM | POA: Diagnosis not present

## 2018-12-17 DIAGNOSIS — I700 Atherosclerosis of aorta: Secondary | ICD-10-CM | POA: Diagnosis not present

## 2018-12-17 DIAGNOSIS — C7801 Secondary malignant neoplasm of right lung: Secondary | ICD-10-CM | POA: Diagnosis not present

## 2018-12-17 DIAGNOSIS — C19 Malignant neoplasm of rectosigmoid junction: Secondary | ICD-10-CM | POA: Diagnosis not present

## 2018-12-17 DIAGNOSIS — J439 Emphysema, unspecified: Secondary | ICD-10-CM | POA: Diagnosis not present

## 2018-12-17 NOTE — Assessment & Plan Note (Signed)
1.  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 -Lung needle biopsy consistent with metastatic adenocarcinoma on 09/16/2015. - BRAF G469S mutation positive. -Status post SBRT to the right lower lobe lung lesion on 10/12/2015 by Dr. Genia Harold. - PET scan in July 2019 showed hypermetabolism in the right lower lobe lung.  He was treated with Xeloda in August 2019 and developed severe exfoliating rash in the groin and buttocks and could not tolerate it.  He also received 1 dose of bevacizumab. -CAT scan on 01/15/2018 showed similar size and metabolic activity in the right lower lobe concerning for residual carcinoma.  No other evidence of metastatic disease. -Stivarga 80 mg 3 weeks on 1 week of from February 2020 through 12/17/2018.  -CT of the chest with contrast on 07/20/2018 showed grossly stable area of presumed postradiation change in the medial posterior right lower lobe with similar areas of surrounding atelectasis and/or scarring.  No new pulmonary nodules seen. -CEA on 12/10/2018 was 3.6.  We reviewed results of the CT CAP on 12/10/2018 which showed dense posttreatment/postradiation consolidation of the medial right lung base with interval resolution of previously seen trace right pleural effusion.  No evidence of residual or recurrent metastatic disease. -He has a ulcer under the right lateral abdominal fold.  There is some amount of pus.  He reportedly had this ulcer for a few months.  I have told him to discontinue Stivarga at this time.  He will continue to follow-up with wound care. -I will see him back in 3 months for follow-up with repeat CT scan.  We will also repeat CEA level.  2.  Diarrhea: -He does have diarrhea after eating, predominantly before noon time.  He is taking Imodium as needed.  3.  Right upper thigh pressure sore: -He follows up with wound care center in [place].

## 2018-12-17 NOTE — Progress Notes (Signed)
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  CURRENT THERAPY: Stivarga 80 mg 3 weeks on 1 week off.  BRIEF ONCOLOGIC HISTORY:  Oncology History  Adenocarcinoma of colon ([place])  10/04/2012 Definitive Surgery   Partial collectomy by Dr. [name]   10/04/2012 Pathology Results   4.5 cm poorly differentiated adenocarcinoma, negative margins, 0/7 lymph nodes for metastatic disease.  Oncotype recurrence score of 26 (high)    Chemotherapy   FOLFOX    Adverse Reaction   Progressive peripheral neuropathy    Chemotherapy   Infusional 5 FU.  Completed 6 months worth of adjuvant therapy.   02/23/2014 Procedure   Colonoscopy by Dr. Anthony Sar.   07/01/2015 PET scan   There is malignant range uptake with RLL pulmonary nodule suspicious for metastatic disease or primary pulm neoplasm. Nonspecific focus of uptake in the L femoral neck. No corresponding CT abnormality. Cannot rule out metastasis.   07/06/2015 Imaging   MR C-spine- C5-6 there is a broad-based disc bulge w R paracentral disc protrusion deforming the spinal cord. Moderate R foraminal stenosis. C6-7 there is a central disc protrusion slightly eccentric towards the R and mildly deforming the ventral c-spine    09/16/2015 Procedure   RLL needle biopsy by IR.   09/17/2015 Pathology Results   Metastatic adenocarcinoma consistent with a primary colorectal adenocarcinoma.   10/12/2015 - 10/22/2015 Radiation Therapy   SBRT by Dr. [name] to RLL pulmonary lesion (biopsy proven to be oligometastasis).   12/09/2015 Imaging   CT chest- 1. Slight interval decrease in size of the right lower lobe pulmonary nodule. Some surrounding radiation changes. 2. No mediastinal or hilar mass or adenopathy. 3. No new pulmonary nodules. 4.  Stable underline emphysematous changes. 5. Cirrhotic changes involving the liver but no upper abdominal metastatic disease.   03/14/2016 Imaging   CT chest- 1. Continued mild reduction in the size of the treated right lobe pulmonary nodule. 2. Increased patchy consolidation, reticulation and ground-glass attenuation in the basilar right lower lobe, nonspecific, favor evolving postradiation change, recommend attention on follow-up chest CT. 3. No evidence of new or progressive metastatic disease in the chest. 4. Aortic atherosclerosis. Left main and 3 vessel coronary atherosclerosis. 5. Mild emphysema.   06/30/2016 Imaging   CT CAP- 1. The appearance of the treated right lower lobe nodule is essentially unchanged, and there are evolving postradiation changes in the base of the right lower lobe, as above. No definite signs of new metastatic disease are noted elsewhere in the chest, abdomen or pelvis. 2. Aortic atherosclerosis, in addition to left main and 3 vessel coronary artery disease. Assessment for potential risk factor modification, dietary therapy or pharmacologic therapy may be warranted, if clinically indicated. 3. Morphologic changes in the liver suggestive of cirrhosis, as above. 4. Additional incidental findings, similar prior studies, as above.   11/17/2016 Imaging   CT CAP Study Result   CLINICAL DATA:  Stage IV colon cancer originally diagnosed in August 2014 status post partial colectomy, with right lower lobe lung metastasis diagnosed August 2017 status post SBRT completed 10/22/2015. Restaging.  EXAM: CT CHEST, ABDOMEN, AND PELVIS WITH CONTRAST  TECHNIQUE: Multidetector CT imaging of the chest, abdomen and pelvis was performed following the standard protocol during bolus administration of intravenous contrast.  CONTRAST:  139m ISOVUE-300 IOPAMIDOL (ISOVUE-300) INJECTION 61%  COMPARISON:  06/30/2016 CT chest, abdomen and pelvis.  FINDINGS: CT  CHEST FINDINGS  Cardiovascular: Normal heart size. No significant pericardial fluid/thickening. Left main, left anterior descending, left circumflex and right coronary atherosclerosis. Atherosclerotic nonaneurysmal thoracic aorta. Normal caliber pulmonary arteries. No central pulmonary emboli. Right internal jugular MediPort terminates at the junction of the right and left brachiocephalic veins.  Mediastinum/Nodes: No discrete thyroid nodules. Unremarkable esophagus. No pathologically enlarged axillary, mediastinal or hilar lymph nodes.  Lungs/Pleura: No pneumothorax. No pleural effusion. There is a nodular 2.7 x 1.6 cm focus of consolidation in the medial basilar right lower lobe (series 3/ image 108), previously 1.6 x 1.1 cm. No appreciable change in patchy subpleural reticulation in both lungs without significant traction bronchiectasis or frank honeycombing. Mild centrilobular and paraseptal emphysema with mild diffuse bronchial wall thickening. No acute consolidative airspace disease or new significant pulmonary nodules.  Musculoskeletal: No aggressive appearing focal osseous lesions. Moderate thoracic spondylosis. Stable chronic left scapular deformity.  CT ABDOMEN PELVIS FINDINGS  Hepatobiliary: Normal liver with no liver mass. Cholecystectomy. No biliary ductal dilatation.  Pancreas: Normal, with no mass or duct dilation.  Spleen: Normal size. No mass.  Adrenals/Urinary Tract: Normal adrenals. No hydronephrosis. No renal masses. Normal bladder.  Stomach/Bowel: Grossly normal stomach. Normal caliber small bowel with no small bowel wall thickening. Stable appearance of the appendix, within normal limits. Stable postsurgical changes from partial distal colectomy with intact appearing distal colonic anastomosis. Oral contrast transits to the cecum. No large bowel wall thickening or new pericholecystic fat stranding .  Vascular/Lymphatic: Atherosclerotic  nonaneurysmal abdominal aorta. Patent portal, splenic, hepatic and renal veins. No pathologically enlarged lymph nodes in the abdomen or pelvis.  Reproductive:  Normal size prostate.  Other: No pneumoperitoneum, ascites or focal fluid collection. Stable subcutaneous calcified granulomas throughout the left lower back and gluteal regions.  Musculoskeletal: No aggressive appearing focal osseous lesions. Marked lumbar spondylosis.  IMPRESSION: 1. Nodular focus of consolidation in the medial basilar right lower lobe is increased in size. While this may represent evolving masslike radiation fibrosis, recurrence of the lung metastasis is not excluded. PET-CT would be useful for further evaluation. Otherwise, continued close chest CT surveillance is advised . 2. No additional potential new sites of metastatic disease in the chest, abdomen or pelvis. 3. No evidence of local tumor recurrence at the distal colonic anastomosis. 4. Left main and 3 vessel coronary atherosclerosis . 5. Aortic Atherosclerosis (ICD10-I70.0) and Emphysema (ICD10-J43.9).      11/30/2016 Relapse/Recurrence   PET: IMPRESSION: 1. Enlarging right lower lobe pulmonary nodule and slight increase in the SUV max suggesting residual neoplasm. No new pulmonary lesions. 2. No findings for local recurrence or abdominal/pelvic metastatic disease.   01/02/2017 - 01/12/2017 Radiation Therapy    The patient saw Dr. MLisbeth Renshawfor SBRT radiation treatment. The tumor in the right lung was treated with a course of stereotactic body radiation treatment. The patient received 50 Gy In 5 fractions at 10 Gy per fraction.      10/05/2017 -  Chemotherapy   The patient had bevacizumab (AVASTIN) 900 mg in sodium chloride 0.9 % 100 mL chemo infusion, 7.3 mg/kg = 925 mg, Intravenous,  Once, 1 of 3 cycles Administration: 900 mg (10/05/2017)  for chemotherapy treatment.       CANCER STAGING: Cancer Staging Adenocarcinoma of colon  ([facility] Staging form: Colon and Rectum, AJCC 7th Edition - Clinical stage from 10/04/2012: Stage IIA (T3, N0, M0) - Signed by KSheldon Silvan  Manon Hilding, PA-C on 07/15/2015 - Pathologic stage from 09/17/2015: Stage IVA (T3, N0, M1a) - Signed by Baird Cancer, PA-C on 10/05/2015    INTERVAL HISTORY:  Mr. Italiano 78 y.o. male seen for follow-up of metastatic colon cancer to the lung.  He has been taking Stivarga 80 mg 3 weeks on 1 week off.  Denies any nausea or vomiting or constipation.  He does report some diarrhea.  Reports shortness of breath on exertion.  Numbness in the hands and feet has been stable.  Appetite is 100%.  Energy levels are 25%.  Pain in the hips is reported as 5 out of 10.    REVIEW OF SYSTEMS:  Review of Systems  Gastrointestinal: Positive for diarrhea.  Neurological: Positive for numbness.  Psychiatric/Behavioral: Positive for sleep disturbance.  All other systems reviewed and are negative.    PAST MEDICAL/SURGICAL HISTORY:  Past Medical History:  Diagnosis Date  . Adenocarcinoma of colon ([place])    RLL -  Pulmonary nodule (06/2015, concerning for mets vs primary; to see RAD-ONC Dr. [name]); stage 2 adenocarinoma   2014 - Colon Cancer - surgery and chemo  . Blood infection ([place])   . Cystitis   . Diabetes mellitus without complication ([place])    Type II  . DVT (deep venous thrombosis) ([place]) 2014   post op  . Hematuria   . Hypertension   . Neuropathy   . Prostatitis   . Pulmonary nodule   . Sleep apnea    Past Surgical History:  Procedure Laterality Date  . BACK SURGERY    . CATARACT EXTRACTION     left  . CATARACT EXTRACTION W/PHACO Right 04/11/2012   Procedure: CATARACT EXTRACTION PHACO AND INTRAOCULAR LENS PLACEMENT (IOC);  Surgeon: Tonny Branch, MD;  Location: [facility] ORS;  Service: Ophthalmology;  Laterality: Right;  CDE:62.48  . CERVICAL FUSION     c4-c5  . CHOLECYSTECTOMY    . COLON SURGERY  09/29/12   Colon resection for cancer  . COLONOSCOPY    . EYE SURGERY      cataract  . GIVENS CAPSULE STUDY N/A 03/15/2015   Procedure: GIVENS CAPSULE STUDY;  Surgeon: Rogene Houston, MD;  Location: [facility] ENDO SUITE;  Service: Endoscopy;  Laterality: N/A;  730  . LEG SURGERY    . ORIF FEMUR FRACTURE     right  . POSTERIOR CERVICAL FUSION/FORAMINOTOMY N/A 07/27/2015   Procedure: Cervical four to Cervical seven Laminectomy with Cervical four to Thoracic one Dorsal internal fixation and fusion;  Surgeon: Kevan Ny Ditty, MD;  Location: [place] NEURO ORS;  Service: Neurosurgery;  Laterality: N/A;  C4 to C7 Laminectomy with C4 to T1 Dorsal internal fixation and fusion     SOCIAL HISTORY:  Social History   Socioeconomic History  . Marital status: Single    Spouse name: Not on file  . Number of children: Not on file  . Years of education: Not on file  . Highest education level: Not on file  Occupational History  . Not on file  Social Needs  . Financial resource strain: Not on file  . Food insecurity    Worry: Not on file    Inability: Not on file  . Transportation needs    Medical: Not on file    Non-medical: Not on file  Tobacco Use  . Smoking status: Former Smoker    Packs/day: 1.00    Years: 55.00    Pack years: 55.00    Types: Cigarettes  Start date: 02/07/1955  . Smokeless tobacco: Never Used  . Tobacco comment: quit 2015  Substance and Sexual Activity  . Alcohol use: Not Currently    Alcohol/week: 3.0 standard drinks    Types: 3 Cans of beer per week  . Drug use: No  . Sexual activity: Yes    Birth control/protection: None  Lifestyle  . Physical activity    Days per week: Not on file    Minutes per session: Not on file  . Stress: Not on file  Relationships  . Social Herbalist on phone: Not on file    Gets together: Not on file    Attends religious service: Not on file    Active member of club or organization: Not on file    Attends meetings of clubs or organizations: Not on file    Relationship status: Not on file  . Intimate  partner violence    Fear of current or ex partner: Not on file    Emotionally abused: Not on file    Physically abused: Not on file    Forced sexual activity: Not on file  Other Topics Concern  . Not on file  Social History Narrative  . Not on file    FAMILY HISTORY:  Family History  Problem Relation Age of Onset  . Heart disease Father   . Heart attack Father   . Alzheimer's disease Mother     CURRENT MEDICATIONS:  Outpatient Encounter Medications as of 12/17/2018  Medication Sig  . aspirin EC 81 MG tablet Take 81 mg by mouth daily.  Marland Kitchen atorvastatin (LIPITOR) 10 MG tablet Take 10 mg by mouth daily at 6 PM.   . furosemide (LASIX) 20 MG tablet Take 20 mg by mouth every other day.   . gabapentin (NEURONTIN) 300 MG capsule Take 2 capsules (600 mg total) by mouth 3 (three) times daily. (Patient taking differently: Take 600 mg by mouth 2 (two) times daily. )  . insulin NPH (HUMULIN N,NOVOLIN N) 100 UNIT/ML injection Inject 15-30 Units into the skin See admin instructions. 25 units every morning and 14 units in the evening  . lisinopril (PRINIVIL,ZESTRIL) 40 MG tablet Take 40 mg by mouth daily.  . naproxen (NAPROSYN) 500 MG tablet TAKE 1 TABLET BY MOUTH TWICE A DAY WITH MEALS  . regorafenib (STIVARGA) 40 MG tablet Take 2 tablets (80 mg total) by mouth daily. Take with low fat breakfast. Take for 21days, then hold for 7days. Take as directed.  Marland Kitchen HYDROcodone-acetaminophen (NORCO/VICODIN) 5-325 MG tablet Take 1 tablet by mouth as needed.   . OXYGEN Inhale 2 L into the lungs continuous. At night time only  . silver sulfADIAZINE (SILVADENE) 1 % cream    No facility-administered encounter medications on file as of 12/17/2018.     ALLERGIES:  No Known Allergies   PHYSICAL EXAM:  ECOG Performance status: 2  Vitals:   12/17/18 1550  BP: (!) 134/47  Pulse: 95  Resp: 20  Temp: (!) 97.1 F (36.2 C)  SpO2: 97%   Filed Weights   12/17/18 1550  Weight: 237 lb (107.5 kg)     Physical Exam Vitals signs reviewed.  Constitutional:      Appearance: Normal appearance.  Cardiovascular:     Rate and Rhythm: Normal rate and regular rhythm.     Heart sounds: Normal heart sounds.  Pulmonary:     Effort: Pulmonary effort is normal.     Breath sounds: Normal breath sounds.  Abdominal:  General: There is no distension.     Palpations: Abdomen is soft. There is no mass.  Musculoskeletal:        General: No swelling.  Neurological:     General: No focal deficit present.     Mental Status: He is alert and oriented to person, place, and time.  Psychiatric:        Mood and Affect: Mood normal.        Behavior: Behavior normal.   4-5 cm ulcer in the right abdominal fold.   LABORATORY DATA:  I have reviewed the labs as listed.  CBC    Component Value Date/Time   WBC 8.8 12/10/2018 0857   RBC 3.88 (L) 12/10/2018 0857   HGB 12.1 (L) 12/10/2018 0857   HCT 38.7 (L) 12/10/2018 0857   PLT 220 12/10/2018 0857   MCV 99.7 12/10/2018 0857   MCH 31.2 12/10/2018 0857   MCHC 31.3 12/10/2018 0857   RDW 14.9 12/10/2018 0857   LYMPHSABS 1.1 12/10/2018 0857   MONOABS 0.7 12/10/2018 0857   EOSABS 0.2 12/10/2018 0857   BASOSABS 0.1 12/10/2018 0857   CMP Latest Ref Rng & Units 12/10/2018 10/15/2018 07/25/2018  Glucose 70 - 99 mg/dL 87 78 94  BUN 8 - 23 mg/dL 25(H) 20 20  Creatinine 0.61 - 1.24 mg/dL 1.00 0.83 0.97  Sodium 135 - 145 mmol/L 137 138 139  Potassium 3.5 - 5.1 mmol/L 4.9 4.8 4.9  Chloride 98 - 111 mmol/L 108 111 109  CO2 22 - 32 mmol/L 22 20(L) 24  Calcium 8.9 - 10.3 mg/dL 8.0(L) 8.2(L) 8.2(L)  Total Protein 6.5 - 8.1 g/dL 6.7 6.8 6.5  Total Bilirubin 0.3 - 1.2 mg/dL 0.4 1.1 0.9  Alkaline Phos 38 - 126 U/L 70 73 69  AST 15 - 41 U/L '23 27 28  '$ ALT 0 - 44 U/L '23 28 27       '$ DIAGNOSTIC IMAGING:  I have independently reviewed the scans and discussed with the patient.      ASSESSMENT & PLAN:   Adenocarcinoma of colon ([place]) 1.  Stage IV colon cancer  to the lungs: -Sigmoid colectomy on 10/04/2012, T3N0, status post adjuvant chemotherapy with FOLFOX completed in 2015. -Lung needle biopsy consistent with metastatic adenocarcinoma on 09/16/2015. - BRAF [id number] mutation positive. -Status post SBRT to the right lower lobe lung lesion on 10/12/2015 by Dr. Genia Harold. - PET scan in July 2019 showed hypermetabolism in the right lower lobe lung.  He was treated with Xeloda in August 2019 and developed severe exfoliating rash in the groin and buttocks and could not tolerate it.  He also received 1 dose of bevacizumab. -CAT scan on 01/15/2018 showed similar size and metabolic activity in the right lower lobe concerning for residual carcinoma.  No other evidence of metastatic disease. -Stivarga 80 mg 3 weeks on 1 week of from February 2020 through 12/17/2018.  -CT of the chest with contrast on 07/20/2018 showed grossly stable area of presumed postradiation change in the medial posterior right lower lobe with similar areas of surrounding atelectasis and/or scarring.  No new pulmonary nodules seen. -CEA on 12/10/2018 was 3.6.  We reviewed results of the CT CAP on 12/10/2018 which showed dense posttreatment/postradiation consolidation of the medial right lung base with interval resolution of previously seen trace right pleural effusion.  No evidence of residual or recurrent metastatic disease. -He has a ulcer under the right lateral abdominal fold.  There is some amount of pus.  He reportedly had this  ulcer for a few months.  I have told him to discontinue Stivarga at this time.  He will continue to follow-up with wound care. -I will see him back in 3 months for follow-up with repeat CT scan.  We will also repeat CEA level.  2.  Diarrhea: -He does have diarrhea after eating, predominantly before noon time.  He is taking Imodium as needed.  3.  Right upper thigh pressure sore: -He follows up with wound care center in [place].  Total time spent is 25 minutes with more than 50%  of the time spent face-to-face discussing treatment plan, counseling and coordination of care.    Orders placed this encounter:  Orders Placed This Encounter  Procedures  . CT Abdomen Pelvis W Contrast  . CT Chest W Contrast  . CBCD  . CMP  . CEA      Derek Jack, MD [place] [phone]

## 2018-12-17 NOTE — Patient Instructions (Addendum)
[place] at [facility] Discharge Instructions  You were seen today by Dr. Delton Coombes. He went over your recent lab results. He will repeat your scans. He will see you back in 3 months for labs and follow up.  STOP taking the Stivarga!  Thank you for choosing [place] at [facility] to provide your oncology and hematology care.  To afford each patient quality time with our provider, please arrive at least 15 minutes before your scheduled appointment time.   If you have a lab appointment with the [facility] please come in thru the  Main Entrance and check in at the main information desk  You need to re-schedule your appointment should you arrive 10 or more minutes late.  We strive to give you quality time with our providers, and arriving late affects you and other patients whose appointments are after yours.  Also, if you no show three or more times for appointments you may be dismissed from the clinic at the providers discretion.     Again, thank you for choosing [facility].  Our hope is that these requests will decrease the amount of time that you wait before being seen by our physicians.       _____________________________________________________________  Should you have questions after your visit to [facility], please contact our office at [phone] between the hours of 8:00 a.m. and 4:30 p.m.  Voicemails left after 4:00 p.m. will not be returned until the following business day.  For prescription refill requests, have your pharmacy contact our office and allow 72 hours.    Cancer Center Support Programs:   > Cancer Support Group  2nd Tuesday of the month 1pm-2pm, Journey Room

## 2018-12-18 DIAGNOSIS — L89892 Pressure ulcer of other site, stage 2: Secondary | ICD-10-CM | POA: Diagnosis not present

## 2018-12-18 DIAGNOSIS — E119 Type 2 diabetes mellitus without complications: Secondary | ICD-10-CM | POA: Diagnosis not present

## 2018-12-18 DIAGNOSIS — Z794 Long term (current) use of insulin: Secondary | ICD-10-CM | POA: Diagnosis not present

## 2018-12-18 DIAGNOSIS — L89312 Pressure ulcer of right buttock, stage 2: Secondary | ICD-10-CM | POA: Diagnosis not present

## 2018-12-18 DIAGNOSIS — C7800 Secondary malignant neoplasm of unspecified lung: Secondary | ICD-10-CM | POA: Diagnosis not present

## 2018-12-18 DIAGNOSIS — I10 Essential (primary) hypertension: Secondary | ICD-10-CM | POA: Diagnosis not present

## 2018-12-19 DIAGNOSIS — L84 Corns and callosities: Secondary | ICD-10-CM | POA: Diagnosis not present

## 2018-12-19 DIAGNOSIS — E1142 Type 2 diabetes mellitus with diabetic polyneuropathy: Secondary | ICD-10-CM | POA: Diagnosis not present

## 2018-12-19 DIAGNOSIS — M79676 Pain in unspecified toe(s): Secondary | ICD-10-CM | POA: Diagnosis not present

## 2018-12-19 DIAGNOSIS — B351 Tinea unguium: Secondary | ICD-10-CM | POA: Diagnosis not present

## 2018-12-23 DIAGNOSIS — I10 Essential (primary) hypertension: Secondary | ICD-10-CM | POA: Diagnosis not present

## 2018-12-23 DIAGNOSIS — E119 Type 2 diabetes mellitus without complications: Secondary | ICD-10-CM | POA: Diagnosis not present

## 2018-12-23 DIAGNOSIS — L89312 Pressure ulcer of right buttock, stage 2: Secondary | ICD-10-CM | POA: Diagnosis not present

## 2018-12-23 DIAGNOSIS — Z794 Long term (current) use of insulin: Secondary | ICD-10-CM | POA: Diagnosis not present

## 2018-12-23 DIAGNOSIS — C7800 Secondary malignant neoplasm of unspecified lung: Secondary | ICD-10-CM | POA: Diagnosis not present

## 2018-12-23 DIAGNOSIS — L89892 Pressure ulcer of other site, stage 2: Secondary | ICD-10-CM | POA: Diagnosis not present

## 2018-12-27 ENCOUNTER — Telehealth (HOSPITAL_COMMUNITY): Payer: Self-pay | Admitting: Pharmacy Technician

## 2018-12-27 NOTE — Telephone Encounter (Signed)
Oral Oncology Patient Advocate Encounter  Faxed renewal application to [employer] on 12/24/2018.  Called to check status of renewal due to receiving an approval letter with end date of 02/06/2019.  Neesha, rep, stated that the patient has been approved from 12/24/2018 until 12/24/2019.  [employer] phone number [phone].  [place] [facility] [place] Phone [phone] Fax [phone] 12/27/2018 2:58 PM

## 2018-12-28 DIAGNOSIS — Z794 Long term (current) use of insulin: Secondary | ICD-10-CM | POA: Diagnosis not present

## 2018-12-28 DIAGNOSIS — E119 Type 2 diabetes mellitus without complications: Secondary | ICD-10-CM | POA: Diagnosis not present

## 2018-12-28 DIAGNOSIS — D630 Anemia in neoplastic disease: Secondary | ICD-10-CM | POA: Diagnosis not present

## 2018-12-28 DIAGNOSIS — Z85038 Personal history of other malignant neoplasm of large intestine: Secondary | ICD-10-CM | POA: Diagnosis not present

## 2018-12-28 DIAGNOSIS — L89312 Pressure ulcer of right buttock, stage 2: Secondary | ICD-10-CM | POA: Diagnosis not present

## 2018-12-28 DIAGNOSIS — E662 Morbid (severe) obesity with alveolar hypoventilation: Secondary | ICD-10-CM | POA: Diagnosis not present

## 2018-12-28 DIAGNOSIS — Z981 Arthrodesis status: Secondary | ICD-10-CM | POA: Diagnosis not present

## 2018-12-28 DIAGNOSIS — I10 Essential (primary) hypertension: Secondary | ICD-10-CM | POA: Diagnosis not present

## 2018-12-28 DIAGNOSIS — M160 Bilateral primary osteoarthritis of hip: Secondary | ICD-10-CM | POA: Diagnosis not present

## 2018-12-28 DIAGNOSIS — M4802 Spinal stenosis, cervical region: Secondary | ICD-10-CM | POA: Diagnosis not present

## 2018-12-28 DIAGNOSIS — C7800 Secondary malignant neoplasm of unspecified lung: Secondary | ICD-10-CM | POA: Diagnosis not present

## 2018-12-28 DIAGNOSIS — L89892 Pressure ulcer of other site, stage 2: Secondary | ICD-10-CM | POA: Diagnosis not present

## 2019-01-01 DIAGNOSIS — L89892 Pressure ulcer of other site, stage 2: Secondary | ICD-10-CM | POA: Diagnosis not present

## 2019-01-01 DIAGNOSIS — C7800 Secondary malignant neoplasm of unspecified lung: Secondary | ICD-10-CM | POA: Diagnosis not present

## 2019-01-01 DIAGNOSIS — I10 Essential (primary) hypertension: Secondary | ICD-10-CM | POA: Diagnosis not present

## 2019-01-01 DIAGNOSIS — L89312 Pressure ulcer of right buttock, stage 2: Secondary | ICD-10-CM | POA: Diagnosis not present

## 2019-01-01 DIAGNOSIS — E119 Type 2 diabetes mellitus without complications: Secondary | ICD-10-CM | POA: Diagnosis not present

## 2019-01-01 DIAGNOSIS — Z794 Long term (current) use of insulin: Secondary | ICD-10-CM | POA: Diagnosis not present

## 2019-01-06 DIAGNOSIS — M160 Bilateral primary osteoarthritis of hip: Secondary | ICD-10-CM | POA: Diagnosis not present

## 2019-01-06 DIAGNOSIS — E119 Type 2 diabetes mellitus without complications: Secondary | ICD-10-CM | POA: Diagnosis not present

## 2019-01-06 DIAGNOSIS — Z794 Long term (current) use of insulin: Secondary | ICD-10-CM | POA: Diagnosis not present

## 2019-01-06 DIAGNOSIS — B372 Candidiasis of skin and nail: Secondary | ICD-10-CM | POA: Diagnosis not present

## 2019-01-06 DIAGNOSIS — I509 Heart failure, unspecified: Secondary | ICD-10-CM | POA: Diagnosis not present

## 2019-01-06 DIAGNOSIS — Z23 Encounter for immunization: Secondary | ICD-10-CM | POA: Diagnosis not present

## 2019-01-13 ENCOUNTER — Encounter (HOSPITAL_COMMUNITY): Payer: Medicare Other

## 2019-03-19 ENCOUNTER — Inpatient Hospital Stay (HOSPITAL_COMMUNITY): Payer: Medicare Other | Attending: Hematology

## 2019-03-19 ENCOUNTER — Inpatient Hospital Stay (HOSPITAL_COMMUNITY): Payer: Medicare Other

## 2019-03-19 ENCOUNTER — Other Ambulatory Visit: Payer: Self-pay

## 2019-03-19 DIAGNOSIS — J439 Emphysema, unspecified: Secondary | ICD-10-CM | POA: Diagnosis not present

## 2019-03-19 DIAGNOSIS — G479 Sleep disorder, unspecified: Secondary | ICD-10-CM | POA: Diagnosis not present

## 2019-03-19 DIAGNOSIS — C189 Malignant neoplasm of colon, unspecified: Secondary | ICD-10-CM

## 2019-03-19 DIAGNOSIS — D649 Anemia, unspecified: Secondary | ICD-10-CM | POA: Insufficient documentation

## 2019-03-19 DIAGNOSIS — Z79899 Other long term (current) drug therapy: Secondary | ICD-10-CM | POA: Insufficient documentation

## 2019-03-19 DIAGNOSIS — L98499 Non-pressure chronic ulcer of skin of other sites with unspecified severity: Secondary | ICD-10-CM | POA: Diagnosis not present

## 2019-03-19 DIAGNOSIS — R197 Diarrhea, unspecified: Secondary | ICD-10-CM | POA: Diagnosis not present

## 2019-03-19 DIAGNOSIS — I2510 Atherosclerotic heart disease of native coronary artery without angina pectoris: Secondary | ICD-10-CM | POA: Diagnosis not present

## 2019-03-19 DIAGNOSIS — Z86718 Personal history of other venous thrombosis and embolism: Secondary | ICD-10-CM | POA: Insufficient documentation

## 2019-03-19 DIAGNOSIS — I700 Atherosclerosis of aorta: Secondary | ICD-10-CM | POA: Insufficient documentation

## 2019-03-19 DIAGNOSIS — Z8249 Family history of ischemic heart disease and other diseases of the circulatory system: Secondary | ICD-10-CM | POA: Diagnosis not present

## 2019-03-19 DIAGNOSIS — K5900 Constipation, unspecified: Secondary | ICD-10-CM | POA: Diagnosis not present

## 2019-03-19 DIAGNOSIS — C7801 Secondary malignant neoplasm of right lung: Secondary | ICD-10-CM | POA: Diagnosis not present

## 2019-03-19 DIAGNOSIS — Z9049 Acquired absence of other specified parts of digestive tract: Secondary | ICD-10-CM | POA: Insufficient documentation

## 2019-03-19 DIAGNOSIS — R200 Anesthesia of skin: Secondary | ICD-10-CM | POA: Insufficient documentation

## 2019-03-19 DIAGNOSIS — Z87891 Personal history of nicotine dependence: Secondary | ICD-10-CM | POA: Diagnosis not present

## 2019-03-19 DIAGNOSIS — C19 Malignant neoplasm of rectosigmoid junction: Secondary | ICD-10-CM | POA: Insufficient documentation

## 2019-03-19 LAB — COMPREHENSIVE METABOLIC PANEL
ALT: 16 U/L (ref 0–44)
AST: 22 U/L (ref 15–41)
Albumin: 3.5 g/dL (ref 3.5–5.0)
Alkaline Phosphatase: 74 U/L (ref 38–126)
Anion gap: 7 (ref 5–15)
BUN: 26 mg/dL — ABNORMAL HIGH (ref 8–23)
CO2: 24 mmol/L (ref 22–32)
Calcium: 8.8 mg/dL — ABNORMAL LOW (ref 8.9–10.3)
Chloride: 105 mmol/L (ref 98–111)
Creatinine, Ser: 0.9 mg/dL (ref 0.61–1.24)
GFR calc Af Amer: >60 mL/min (ref >60)
GFR calc non Af Amer: >60 mL/min (ref >60)
Glucose, Bld: 132 mg/dL — ABNORMAL HIGH (ref 70–99)
Potassium: 5 mmol/L (ref 3.5–5.1)
Sodium: 136 mmol/L (ref 135–145)
Total Bilirubin: 0.7 mg/dL (ref 0.3–1.2)
Total Protein: 7.1 g/dL (ref 6.5–8.1)

## 2019-03-19 LAB — CBC WITH DIFFERENTIAL/PLATELET
Abs Immature Granulocytes: 0.02 10*3/uL (ref 0.00–0.07)
Basophils Absolute: 0.1 10*3/uL (ref 0.0–0.1)
Basophils Relative: 1 %
Eosinophils Absolute: 0.2 10*3/uL (ref 0.0–0.5)
Eosinophils Relative: 2 %
HCT: 37.8 % — ABNORMAL LOW (ref 39.0–52.0)
Hemoglobin: 11.9 g/dL — ABNORMAL LOW (ref 13.0–17.0)
Immature Granulocytes: 0 %
Lymphocytes Relative: 20 %
Lymphs Abs: 1.6 10*3/uL (ref 0.7–4.0)
MCH: 30.7 pg (ref 26.0–34.0)
MCHC: 31.5 g/dL (ref 30.0–36.0)
MCV: 97.7 fL (ref 80.0–100.0)
Monocytes Absolute: 0.8 10*3/uL (ref 0.1–1.0)
Monocytes Relative: 10 %
Neutro Abs: 5.4 10*3/uL (ref 1.7–7.7)
Neutrophils Relative %: 67 %
Platelets: 258 10*3/uL (ref 150–400)
RBC: 3.87 MIL/uL — ABNORMAL LOW (ref 4.22–5.81)
RDW: 14.2 % (ref 11.5–15.5)
WBC: 8 10*3/uL (ref 4.0–10.5)
nRBC: 0 % (ref 0.0–0.2)

## 2019-03-19 MED ORDER — HEPARIN SOD (PORK) LOCK FLUSH 100 UNIT/ML IV SOLN
500.0000 [IU] | Freq: Once | INTRAVENOUS | Status: AC
  Administered 2019-03-19: 500 [IU] via INTRAVENOUS

## 2019-03-19 MED ORDER — SODIUM CHLORIDE 0.9% FLUSH
20.0000 mL | Freq: Once | INTRAVENOUS | Status: AC
  Administered 2019-03-19: 20 mL via INTRAVENOUS

## 2019-03-21 ENCOUNTER — Other Ambulatory Visit: Payer: Self-pay

## 2019-03-21 ENCOUNTER — Ambulatory Visit (HOSPITAL_COMMUNITY)
Admission: RE | Admit: 2019-03-21 | Discharge: 2019-03-21 | Disposition: A | Discharge status: Home / Self Care | Payer: Medicare Other | Source: Ambulatory Visit | Attending: Hematology | Admitting: Hematology

## 2019-03-21 DIAGNOSIS — C189 Malignant neoplasm of colon, unspecified: Secondary | ICD-10-CM | POA: Insufficient documentation

## 2019-03-21 MED ORDER — IOHEXOL 300 MG/ML  SOLN
100.0000 mL | Freq: Once | INTRAMUSCULAR | Status: AC | PRN
  Administered 2019-03-21: 11:00:00 100 mL via INTRAVENOUS

## 2019-03-26 ENCOUNTER — Inpatient Hospital Stay (HOSPITAL_BASED_OUTPATIENT_CLINIC_OR_DEPARTMENT_OTHER): Payer: Medicare Other | Admitting: Hematology

## 2019-03-26 ENCOUNTER — Other Ambulatory Visit: Payer: Self-pay

## 2019-03-26 VITALS — BP 137/52 | HR 90 | Temp 97.1°F | Resp 19 | Wt 244.2 lb

## 2019-03-26 DIAGNOSIS — C19 Malignant neoplasm of rectosigmoid junction: Secondary | ICD-10-CM | POA: Diagnosis not present

## 2019-03-26 DIAGNOSIS — C189 Malignant neoplasm of colon, unspecified: Secondary | ICD-10-CM

## 2019-03-26 NOTE — Patient Instructions (Signed)
[facility] at [facility] Discharge Instructions  You were seen today by Dr. Katragadda. He went over your recent lab results. He will see you back in 4 months for labs and follow up.   Thank you for choosing [facility] at [facility] to provide your oncology and hematology care.  To afford each patient quality time with our provider, please arrive at least 15 minutes before your scheduled appointment time.   If you have a lab appointment with the Cancer Center please come in thru the  Main Entrance and check in at the main information desk  You need to re-schedule your appointment should you arrive 10 or more minutes late.  We strive to give you quality time with our providers, and arriving late affects you and other patients whose appointments are after yours.  Also, if you no show three or more times for appointments you may be dismissed from the clinic at the providers discretion.     Again, thank you for choosing [facility].  Our hope is that these requests will decrease the amount of time that you wait before being seen by our physicians.       _____________________________________________________________  Should you have questions after your visit to [facility], please contact our office at [phone] between the hours of 8:00 a.m. and 4:30 p.m.  Voicemails left after 4:00 p.m. will not be returned until the following business day.  For prescription refill requests, have your pharmacy contact our office and allow 72 hours.    Cancer Center Support Programs:   > Cancer Support Group  2nd Tuesday of the month 1pm-2pm, Journey Room    

## 2019-03-27 LAB — CEA

## 2019-04-06 NOTE — Assessment & Plan Note (Signed)
1.  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 -We have discontinued Stivarga because of unhealed wounds. -His wounds in the groin and in the back has healed up nicely. -We reviewed labs from 03/19/2019.  LFTs are within normal limits.  White count and platelets are normal.  He is mildly anemic. -CEA is pending.  We reviewed CT scans of the CAP from 03/21/2019.  No evidence of recurrence.  Other minor findings were explained to the patient. -I plan to see him back in 4 months with repeat labs and CT scan.  2.  Chronic ulcers: -he had ulcers in the right upper thigh which completely healed since we stopped Stivarga.  He is no longer following with wound care.

## 2019-04-06 NOTE — Progress Notes (Signed)
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  CURRENT THERAPY: Stivarga 80 mg 3 weeks on 1 week off.  BRIEF ONCOLOGIC HISTORY:  Oncology History  Adenocarcinoma of colon ([place])  10/04/2012 Definitive Surgery   Partial collectomy by Dr. [name]   10/04/2012 Pathology Results   4.5 cm poorly differentiated adenocarcinoma, negative margins, 0/7 lymph nodes for metastatic disease.  Oncotype recurrence score of 26 (high)    Chemotherapy   FOLFOX    Adverse Reaction   Progressive peripheral neuropathy    Chemotherapy   Infusional 5 FU.  Completed 6 months worth of adjuvant therapy.   02/23/2014 Procedure   Colonoscopy by Dr. Anthony Sar.   07/01/2015 PET scan   There is malignant range uptake with RLL pulmonary nodule suspicious for metastatic disease or primary pulm neoplasm. Nonspecific focus of uptake in the L femoral neck. No corresponding CT abnormality. Cannot rule out metastasis.   07/06/2015 Imaging   MR C-spine- C5-6 there is a broad-based disc bulge w R paracentral disc protrusion deforming the spinal cord. Moderate R foraminal stenosis. C6-7 there is a central disc protrusion slightly eccentric towards the R and mildly deforming the ventral c-spine    09/16/2015 Procedure   RLL needle biopsy by IR.   09/17/2015 Pathology Results   Metastatic adenocarcinoma consistent with a primary colorectal adenocarcinoma.   10/12/2015 - 10/22/2015 Radiation Therapy   SBRT by Dr. [name] to RLL pulmonary lesion (biopsy proven to be oligometastasis).   12/09/2015 Imaging   CT chest- 1. Slight interval decrease in size of the right lower lobe pulmonary nodule. Some surrounding radiation changes. 2. No mediastinal or hilar mass or adenopathy. 3. No new pulmonary nodules. 4.  Stable underline emphysematous changes. 5. Cirrhotic changes involving the liver but no upper abdominal metastatic disease.   03/14/2016 Imaging   CT chest- 1. Continued mild reduction in the size of the treated right lobe pulmonary nodule. 2. Increased patchy consolidation, reticulation and ground-glass attenuation in the basilar right lower lobe, nonspecific, favor evolving postradiation change, recommend attention on follow-up chest CT. 3. No evidence of new or progressive metastatic disease in the chest. 4. Aortic atherosclerosis. Left main and 3 vessel coronary atherosclerosis. 5. Mild emphysema.   06/30/2016 Imaging   CT CAP- 1. The appearance of the treated right lower lobe nodule is essentially unchanged, and there are evolving postradiation changes in the base of the right lower lobe, as above. No definite signs of new metastatic disease are noted elsewhere in the chest, abdomen or pelvis. 2. Aortic atherosclerosis, in addition to left main and 3 vessel coronary artery disease. Assessment for potential risk factor modification, dietary therapy or pharmacologic therapy may be warranted, if clinically indicated. 3. Morphologic changes in the liver suggestive of cirrhosis, as above. 4. Additional incidental findings, similar prior studies, as above.   11/17/2016 Imaging   CT CAP Study Result   CLINICAL DATA:  Stage IV colon cancer originally diagnosed in August 2014 status post partial colectomy, with right lower lobe lung metastasis diagnosed August 2017 status post SBRT completed 10/22/2015. Restaging.  EXAM: CT CHEST, ABDOMEN, AND PELVIS WITH CONTRAST  TECHNIQUE: Multidetector CT imaging of the chest, abdomen and pelvis was performed following the standard protocol during bolus administration of intravenous contrast.  CONTRAST:  157mL ISOVUE-300 IOPAMIDOL (ISOVUE-300) INJECTION 61%  COMPARISON:  06/30/2016 CT chest, abdomen and pelvis.  FINDINGS: CT  CHEST FINDINGS  Cardiovascular: Normal heart size. No significant pericardial fluid/thickening. Left main, left anterior descending, left circumflex and right coronary atherosclerosis. Atherosclerotic nonaneurysmal thoracic aorta. Normal caliber pulmonary arteries. No central pulmonary emboli. Right internal jugular MediPort terminates at the junction of the right and left brachiocephalic veins.  Mediastinum/Nodes: No discrete thyroid nodules. Unremarkable esophagus. No pathologically enlarged axillary, mediastinal or hilar lymph nodes.  Lungs/Pleura: No pneumothorax. No pleural effusion. There is a nodular 2.7 x 1.6 cm focus of consolidation in the medial basilar right lower lobe (series 3/ image 108), previously 1.6 x 1.1 cm. No appreciable change in patchy subpleural reticulation in both lungs without significant traction bronchiectasis or frank honeycombing. Mild centrilobular and paraseptal emphysema with mild diffuse bronchial wall thickening. No acute consolidative airspace disease or new significant pulmonary nodules.  Musculoskeletal: No aggressive appearing focal osseous lesions. Moderate thoracic spondylosis. Stable chronic left scapular deformity.  CT ABDOMEN PELVIS FINDINGS  Hepatobiliary: Normal liver with no liver mass. Cholecystectomy. No biliary ductal dilatation.  Pancreas: Normal, with no mass or duct dilation.  Spleen: Normal size. No mass.  Adrenals/Urinary Tract: Normal adrenals. No hydronephrosis. No renal masses. Normal bladder.  Stomach/Bowel: Grossly normal stomach. Normal caliber small bowel with no small bowel wall thickening. Stable appearance of the appendix, within normal limits. Stable postsurgical changes from partial distal colectomy with intact appearing distal colonic anastomosis. Oral contrast transits to the cecum. No large bowel wall thickening or new pericholecystic fat stranding .  Vascular/Lymphatic: Atherosclerotic  nonaneurysmal abdominal aorta. Patent portal, splenic, hepatic and renal veins. No pathologically enlarged lymph nodes in the abdomen or pelvis.  Reproductive:  Normal size prostate.  Other: No pneumoperitoneum, ascites or focal fluid collection. Stable subcutaneous calcified granulomas throughout the left lower back and gluteal regions.  Musculoskeletal: No aggressive appearing focal osseous lesions. Marked lumbar spondylosis.  IMPRESSION: 1. Nodular focus of consolidation in the medial basilar right lower lobe is increased in size. While this may represent evolving masslike radiation fibrosis, recurrence of the lung metastasis is not excluded. PET-CT would be useful for further evaluation. Otherwise, continued close chest CT surveillance is advised . 2. No additional potential new sites of metastatic disease in the chest, abdomen or pelvis. 3. No evidence of local tumor recurrence at the distal colonic anastomosis. 4. Left main and 3 vessel coronary atherosclerosis . 5. Aortic Atherosclerosis (ICD10-I70.0) and Emphysema (ICD10-J43.9).      11/30/2016 Relapse/Recurrence   PET: IMPRESSION: 1. Enlarging right lower lobe pulmonary nodule and slight increase in the SUV max suggesting residual neoplasm. No new pulmonary lesions. 2. No findings for local recurrence or abdominal/pelvic metastatic disease.   01/02/2017 - 01/12/2017 Radiation Therapy    The patient saw Dr. [name] for SBRT radiation treatment. The tumor in the right lung was treated with a course of stereotactic body radiation treatment. The patient received 50 Gy In 5 fractions at 10 Gy per fraction.      10/05/2017 -  Chemotherapy   The patient had bevacizumab (AVASTIN) 900 mg in sodium chloride 0.9 % 100 mL chemo infusion, 7.3 mg/kg = 925 mg, Intravenous,  Once, 1 of 3 cycles Administration: 900 mg (10/05/2017)  for chemotherapy treatment.       CANCER STAGING: Cancer Staging Adenocarcinoma of colon  [facility]) Staging form: Colon and Rectum, AJCC 7th Edition - Clinical stage from 10/04/2012: Stage IIA (T3, N0, M0) - Signed by Sheldon Silvan,  Manon Hilding, PA-C on 07/15/2015 - Pathologic stage from 09/17/2015: Stage IVA (T3, N0, M1a) - Signed by Baird Cancer, PA-C on 10/05/2015    INTERVAL HISTORY:  Mr. Oetken 79 y.o. male seen for follow-up of metastatic colon cancer to the lung.  We have discontinued Stivarga at last visit.  His diarrhea has improved.  He now has some constipation.  Numbness in the legs and feet has been stable.  Appetite and energy levels are 100%.  He feels much better since the Stivarga was discontinued.  His ulcers have healed up.   REVIEW OF SYSTEMS:  Review of Systems  Gastrointestinal: Positive for constipation.  Neurological: Positive for numbness.  Psychiatric/Behavioral: Positive for sleep disturbance.  All other systems reviewed and are negative.    PAST MEDICAL/SURGICAL HISTORY:  Past Medical History:  Diagnosis Date  . Adenocarcinoma of colon ([place])    RLL -  Pulmonary nodule (06/2015, concerning for mets vs primary; to see RAD-ON[facility] Dr. [name]); stage 2 adenocarinoma   2014 - Colon Cancer - surgery and chemo  . Blood infection ([place])   . Cystitis   . Diabetes mellitus without complication ([place])    Type II  . DVT (deep venous thrombosis) ([place]) 2014   post op  . Hematuria   . Hypertension   . Neuropathy   . Prostatitis   . Pulmonary nodule   . Sleep apnea    Past Surgical History:  Procedure Laterality Date  . BACK SURGERY    . CATARACT EXTRACTION     left  . CATARACT EXTRACTION W/PHACO Right 04/11/2012   Procedure: CATARACT EXTRACTION PHACO AND INTRAOCULAR LENS PLACEMENT (IOC);  Surgeon: Tonny Branch, MD;  Location: [facility] ORS;  Service: Ophthalmology;  Laterality: Right;  CDE:62.48  . CERVICAL FUSION     c4-c5  . CHOLECYSTECTOMY    . COLON SURGERY  09/29/12   Colon resection for cancer  . COLONOSCOPY    . EYE SURGERY     cataract  . GIVENS CAPSULE STUDY N/A  03/15/2015   Procedure: GIVENS CAPSULE STUDY;  Surgeon: Rogene Houston, MD;  Location: [facility] ENDO SUITE;  Service: Endoscopy;  Laterality: N/A;  730  . LEG SURGERY    . ORIF FEMUR FRACTURE     right  . POSTERIOR CERVICAL FUSION/FORAMINOTOMY N/A 07/27/2015   Procedure: Cervical four to Cervical seven Laminectomy with Cervical four to Thoracic one Dorsal internal fixation and fusion;  Surgeon: Kevan Ny Ditty, MD;  Location: [facility];  Service: Neurosurgery;  Laterality: N/A;  C4 to C7 Laminectomy with C4 to T1 Dorsal internal fixation and fusion     SOCIAL HISTORY:  Social History   Socioeconomic History  . Marital status: Single    Spouse name: Not on file  . Number of children: Not on file  . Years of education: Not on file  . Highest education level: Not on file  Occupational History  . Not on file  Tobacco Use  . Smoking status: Former Smoker    Packs/day: 1.00    Years: 55.00    Pack years: 55.00    Types: Cigarettes    Start date: 02/07/1955  . Smokeless tobacco: Never Used  . Tobacco comment: quit 2015  Substance and Sexual Activity  . Alcohol use: Not Currently    Alcohol/week: 3.0 standard drinks    Types: 3 Cans of beer per week  . Drug use: No  . Sexual activity: Yes    Birth control/protection: None  Other Topics  Concern  . Not on file  Social History Narrative  . Not on file   Social Determinants of Health   Financial Resource Strain:   . Difficulty of Paying Living Expenses: Not on file  Food Insecurity:   . Worried About Charity fundraiser in the Last Year: Not on file  . Ran Out of Food in the Last Year: Not on file  Transportation Needs:   . Lack of Transportation (Medical): Not on file  . Lack of Transportation (Non-Medical): Not on file  Physical Activity:   . Days of Exercise per Week: Not on file  . Minutes of Exercise per Session: Not on file  Stress:   . Feeling of Stress : Not on file  Social Connections:   . Frequency of  Communication with Friends and Family: Not on file  . Frequency of Social Gatherings with Friends and Family: Not on file  . Attends Religious Services: Not on file  . Active Member of Clubs or Organizations: Not on file  . Attends Archivist Meetings: Not on file  . Marital Status: Not on file  Intimate Partner Violence:   . Fear of Current or Ex-Partner: Not on file  . Emotionally Abused: Not on file  . Physically Abused: Not on file  . Sexually Abused: Not on file    FAMILY HISTORY:  Family History  Problem Relation Age of Onset  . Heart disease Father   . Heart attack Father   . Alzheimer's disease Mother     CURRENT MEDICATIONS:  Outpatient Encounter Medications as of 03/26/2019  Medication Sig  . aspirin EC 81 MG tablet Take 81 mg by mouth daily.  Marland Kitchen atorvastatin (LIPITOR) 10 MG tablet Take 10 mg by mouth daily at 6 PM.   . furosemide (LASIX) 20 MG tablet Take 20 mg by mouth every other day.   . gabapentin (NEURONTIN) 300 MG capsule Take 2 capsules (600 mg total) by mouth 3 (three) times daily. (Patient taking differently: Take 600 mg by mouth 2 (two) times daily. )  . insulin NPH (HUMULIN N,NOVOLIN N) 100 UNIT/ML injection Inject 15-30 Units into the skin See admin instructions. 25 units every morning and 14 units in the evening  . lisinopril (PRINIVIL,ZESTRIL) 40 MG tablet Take 40 mg by mouth daily.  . naproxen (NAPROSYN) 500 MG tablet TAKE 1 TABLET BY MOUTH TWICE A DAY WITH MEALS  . regorafenib (STIVARGA) 40 MG tablet Take 2 tablets (80 mg total) by mouth daily. Take with low fat breakfast. Take for 21days, then hold for 7days. Take as directed.  Marland Kitchen HYDROcodone-acetaminophen (NORCO/VICODIN) 5-325 MG tablet Take 1 tablet by mouth as needed.   . OXYGEN Inhale 2 L into the lungs continuous. At night time only  . silver sulfADIAZINE (SILVADENE) 1 % cream    No facility-administered encounter medications on file as of 03/26/2019.    ALLERGIES:  No Known  Allergies   PHYSICAL EXAM:  ECOG Performance status: 2  Vitals:   03/26/19 1545  BP: (!) 137/52  Pulse: 90  Resp: 19  Temp: (!) 97.1 F (36.2 C)  SpO2: 99%   Filed Weights   03/26/19 1545  Weight: 244 lb 3.2 oz (110.8 kg)    Physical Exam Vitals reviewed.  Constitutional:      Appearance: Normal appearance.  Cardiovascular:     Rate and Rhythm: Normal rate and regular rhythm.     Heart sounds: Normal heart sounds.  Pulmonary:     Effort:  Pulmonary effort is normal.     Breath sounds: Normal breath sounds.  Abdominal:     General: There is no distension.     Palpations: Abdomen is soft. There is no mass.  Musculoskeletal:        General: No swelling.  Neurological:     General: No focal deficit present.     Mental Status: He is alert and oriented to person, place, and time.  Psychiatric:        Mood and Affect: Mood normal.        Behavior: Behavior normal.      LABORATORY DATA:  I have reviewed the labs as listed.  CBC    Component Value Date/Time   WBC 8.0 03/19/2019 1207   RBC 3.87 (L) 03/19/2019 1207   HGB 11.9 (L) 03/19/2019 1207   HCT 37.8 (L) 03/19/2019 1207   PLT 258 03/19/2019 1207   MCV 97.7 03/19/2019 1207   MCH 30.7 03/19/2019 1207   MCHC 31.5 03/19/2019 1207   RDW 14.2 03/19/2019 1207   LYMPHSABS 1.6 03/19/2019 1207   MONOABS 0.8 03/19/2019 1207   EOSABS 0.2 03/19/2019 1207   BASOSABS 0.1 03/19/2019 1207   CMP Latest Ref Rng & Units 03/19/2019 12/10/2018 10/15/2018  Glucose 70 - 99 mg/dL 132(H) 87 78  BUN 8 - 23 mg/dL 26(H) 25(H) 20  Creatinine 0.61 - 1.24 mg/dL 0.90 1.00 0.83  Sodium 135 - 145 mmol/L 136 137 138  Potassium 3.5 - 5.1 mmol/L 5.0 4.9 4.8  Chloride 98 - 111 mmol/L 105 108 111  CO2 22 - 32 mmol/L 24 22 20(L)  Calcium 8.9 - 10.3 mg/dL 8.8(L) 8.0(L) 8.2(L)  Total Protein 6.5 - 8.1 g/dL 7.1 6.7 6.8  Total Bilirubin 0.3 - 1.2 mg/dL 0.7 0.4 1.1  Alkaline Phos 38 - 126 U/L 74 70 73  AST 15 - 41 U/L 22 23 27   ALT 0 - 44 U/L 16  23 28        DIAGNOSTIC IMAGING:  I have independently reviewed the scans and discussed with the patient.      ASSESSMENT & PLAN:   Adenocarcinoma of colon ([place]) 1.  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 -We have discontinued Stivarga because of unhealed wounds. -His wounds in the groin and in the back has healed up nicely. -We reviewed labs from 03/19/2019.  LFTs are within normal limits.  White count and platelets are normal.  He is mildly anemic. -CEA is pending.  We reviewed CT scans of the CAP from 03/21/2019.  No evidence of recurrence.  Other minor findings were explained to the patient. -I plan to see him back in 4 months with repeat labs and CT scan.  2.  Chronic ulcers: -he had ulcers in the right upper thigh which completely healed since we stopped Stivarga.  He is no longer following with wound care.     Orders placed this encounter:  Orders Placed This Encounter  Procedures  . CT Abdomen Pelvis W Contrast  . CT Chest W Contrast  . CBC with Differential/Platelet  . Comprehensive metabolic panel  . CEA      Derek Jack, MD [place] [phone]

## 2019-06-18 ENCOUNTER — Encounter (HOSPITAL_COMMUNITY): Payer: Medicare Other

## 2019-07-17 ENCOUNTER — Inpatient Hospital Stay (HOSPITAL_COMMUNITY): Payer: Medicare Other | Attending: Hematology

## 2019-07-17 ENCOUNTER — Other Ambulatory Visit: Payer: Self-pay

## 2019-07-17 DIAGNOSIS — Z9049 Acquired absence of other specified parts of digestive tract: Secondary | ICD-10-CM | POA: Insufficient documentation

## 2019-07-17 DIAGNOSIS — R197 Diarrhea, unspecified: Secondary | ICD-10-CM | POA: Diagnosis not present

## 2019-07-17 DIAGNOSIS — K766 Portal hypertension: Secondary | ICD-10-CM | POA: Diagnosis not present

## 2019-07-17 DIAGNOSIS — Z818 Family history of other mental and behavioral disorders: Secondary | ICD-10-CM | POA: Diagnosis not present

## 2019-07-17 DIAGNOSIS — J90 Pleural effusion, not elsewhere classified: Secondary | ICD-10-CM | POA: Diagnosis not present

## 2019-07-17 DIAGNOSIS — J984 Other disorders of lung: Secondary | ICD-10-CM | POA: Diagnosis not present

## 2019-07-17 DIAGNOSIS — D539 Nutritional anemia, unspecified: Secondary | ICD-10-CM | POA: Insufficient documentation

## 2019-07-17 DIAGNOSIS — Z86718 Personal history of other venous thrombosis and embolism: Secondary | ICD-10-CM | POA: Insufficient documentation

## 2019-07-17 DIAGNOSIS — K5900 Constipation, unspecified: Secondary | ICD-10-CM | POA: Diagnosis not present

## 2019-07-17 DIAGNOSIS — Z87891 Personal history of nicotine dependence: Secondary | ICD-10-CM | POA: Insufficient documentation

## 2019-07-17 DIAGNOSIS — C189 Malignant neoplasm of colon, unspecified: Secondary | ICD-10-CM | POA: Insufficient documentation

## 2019-07-17 DIAGNOSIS — K7460 Unspecified cirrhosis of liver: Secondary | ICD-10-CM | POA: Insufficient documentation

## 2019-07-17 DIAGNOSIS — K449 Diaphragmatic hernia without obstruction or gangrene: Secondary | ICD-10-CM | POA: Diagnosis not present

## 2019-07-17 DIAGNOSIS — R350 Frequency of micturition: Secondary | ICD-10-CM | POA: Insufficient documentation

## 2019-07-17 DIAGNOSIS — I2510 Atherosclerotic heart disease of native coronary artery without angina pectoris: Secondary | ICD-10-CM | POA: Insufficient documentation

## 2019-07-17 DIAGNOSIS — I700 Atherosclerosis of aorta: Secondary | ICD-10-CM | POA: Insufficient documentation

## 2019-07-17 DIAGNOSIS — Z79899 Other long term (current) drug therapy: Secondary | ICD-10-CM | POA: Diagnosis not present

## 2019-07-17 DIAGNOSIS — Z8249 Family history of ischemic heart disease and other diseases of the circulatory system: Secondary | ICD-10-CM | POA: Insufficient documentation

## 2019-07-17 LAB — COMPREHENSIVE METABOLIC PANEL
ALT: 17 U/L (ref 0–44)
AST: 22 U/L (ref 15–41)
Albumin: 3.4 g/dL — ABNORMAL LOW (ref 3.5–5.0)
Alkaline Phosphatase: 63 U/L (ref 38–126)
Anion gap: 7 (ref 5–15)
BUN: 26 mg/dL — ABNORMAL HIGH (ref 8–23)
CO2: 24 mmol/L (ref 22–32)
Calcium: 8.7 mg/dL — ABNORMAL LOW (ref 8.9–10.3)
Chloride: 106 mmol/L (ref 98–111)
Creatinine, Ser: 1.03 mg/dL (ref 0.61–1.24)
GFR calc Af Amer: >60 mL/min (ref >60)
GFR calc non Af Amer: >60 mL/min (ref >60)
Glucose, Bld: 88 mg/dL (ref 70–99)
Potassium: 5 mmol/L (ref 3.5–5.1)
Sodium: 137 mmol/L (ref 135–145)
Total Bilirubin: 0.7 mg/dL (ref 0.3–1.2)
Total Protein: 6.6 g/dL (ref 6.5–8.1)

## 2019-07-17 LAB — CBC WITH DIFFERENTIAL/PLATELET
Abs Immature Granulocytes: 0.03 10*3/uL (ref 0.00–0.07)
Basophils Absolute: 0.1 10*3/uL (ref 0.0–0.1)
Basophils Relative: 1 %
Eosinophils Absolute: 0.2 10*3/uL (ref 0.0–0.5)
Eosinophils Relative: 3 %
HCT: 36.5 % — ABNORMAL LOW (ref 39.0–52.0)
Hemoglobin: 11.6 g/dL — ABNORMAL LOW (ref 13.0–17.0)
Immature Granulocytes: 0 %
Lymphocytes Relative: 16 %
Lymphs Abs: 1.4 10*3/uL (ref 0.7–4.0)
MCH: 31.9 pg (ref 26.0–34.0)
MCHC: 31.8 g/dL (ref 30.0–36.0)
MCV: 100.3 fL — ABNORMAL HIGH (ref 80.0–100.0)
Monocytes Absolute: 0.9 10*3/uL (ref 0.1–1.0)
Monocytes Relative: 11 %
Neutro Abs: 6.1 10*3/uL (ref 1.7–7.7)
Neutrophils Relative %: 69 %
Platelets: 246 10*3/uL (ref 150–400)
RBC: 3.64 MIL/uL — ABNORMAL LOW (ref 4.22–5.81)
RDW: 14.4 % (ref 11.5–15.5)
WBC: 8.8 10*3/uL (ref 4.0–10.5)
nRBC: 0 % (ref 0.0–0.2)

## 2019-07-18 LAB — CEA: CEA: 3.8 ng/mL (ref 0.0–4.7)

## 2019-07-24 ENCOUNTER — Other Ambulatory Visit: Payer: Self-pay

## 2019-07-24 ENCOUNTER — Ambulatory Visit (HOSPITAL_COMMUNITY)
Admission: RE | Admit: 2019-07-24 | Discharge: 2019-07-24 | Disposition: A | Discharge status: Home / Self Care | Payer: Medicare Other | Source: Ambulatory Visit | Attending: Hematology | Admitting: Hematology

## 2019-07-24 DIAGNOSIS — C189 Malignant neoplasm of colon, unspecified: Secondary | ICD-10-CM | POA: Insufficient documentation

## 2019-07-24 MED ORDER — IOHEXOL 300 MG/ML  SOLN
100.0000 mL | Freq: Once | INTRAMUSCULAR | Status: AC | PRN
  Administered 2019-07-24: 100 mL via INTRAVENOUS

## 2019-07-28 ENCOUNTER — Inpatient Hospital Stay (HOSPITAL_COMMUNITY): Payer: Medicare Other

## 2019-07-28 ENCOUNTER — Ambulatory Visit (HOSPITAL_COMMUNITY): Payer: Medicare Other | Admitting: Oncology

## 2019-07-28 ENCOUNTER — Encounter (HOSPITAL_COMMUNITY): Payer: Self-pay | Admitting: Oncology

## 2019-07-28 ENCOUNTER — Other Ambulatory Visit: Payer: Self-pay

## 2019-07-28 ENCOUNTER — Inpatient Hospital Stay (HOSPITAL_BASED_OUTPATIENT_CLINIC_OR_DEPARTMENT_OTHER): Payer: Medicare Other | Admitting: Oncology

## 2019-07-28 VITALS — BP 156/57 | HR 85 | Temp 98.0°F | Resp 18 | Wt 249.0 lb

## 2019-07-28 DIAGNOSIS — K766 Portal hypertension: Secondary | ICD-10-CM | POA: Insufficient documentation

## 2019-07-28 DIAGNOSIS — K7460 Unspecified cirrhosis of liver: Secondary | ICD-10-CM

## 2019-07-28 DIAGNOSIS — C189 Malignant neoplasm of colon, unspecified: Secondary | ICD-10-CM | POA: Diagnosis not present

## 2019-07-28 DIAGNOSIS — D539 Nutritional anemia, unspecified: Secondary | ICD-10-CM | POA: Diagnosis not present

## 2019-07-28 HISTORY — DX: Unspecified cirrhosis of liver: K74.60

## 2019-07-28 HISTORY — DX: Portal hypertension: K76.6

## 2019-07-28 LAB — CBC WITH DIFFERENTIAL/PLATELET
Abs Immature Granulocytes: 0.02 10*3/uL (ref 0.00–0.07)
Basophils Absolute: 0.1 10*3/uL (ref 0.0–0.1)
Basophils Relative: 1 %
Eosinophils Absolute: 0.3 10*3/uL (ref 0.0–0.5)
Eosinophils Relative: 3 %
HCT: 38.1 % — ABNORMAL LOW (ref 39.0–52.0)
Hemoglobin: 12.3 g/dL — ABNORMAL LOW (ref 13.0–17.0)
Immature Granulocytes: 0 %
Lymphocytes Relative: 18 %
Lymphs Abs: 1.5 10*3/uL (ref 0.7–4.0)
MCH: 32 pg (ref 26.0–34.0)
MCHC: 32.3 g/dL (ref 30.0–36.0)
MCV: 99.2 fL (ref 80.0–100.0)
Monocytes Absolute: 0.9 10*3/uL (ref 0.1–1.0)
Monocytes Relative: 10 %
Neutro Abs: 6 10*3/uL (ref 1.7–7.7)
Neutrophils Relative %: 68 %
Platelets: 247 10*3/uL (ref 150–400)
RBC: 3.84 MIL/uL — ABNORMAL LOW (ref 4.22–5.81)
RDW: 13.9 % (ref 11.5–15.5)
WBC: 8.8 10*3/uL (ref 4.0–10.5)
nRBC: 0 % (ref 0.0–0.2)

## 2019-07-28 LAB — FERRITIN: Ferritin: 58 ng/mL (ref 24–336)

## 2019-07-28 LAB — IRON AND TIBC
Iron: 104 ug/dL (ref 45–182)
Saturation Ratios: 23 % (ref 17.9–39.5)
TIBC: 443 ug/dL (ref 250–450)
UIBC: 339 ug/dL

## 2019-07-28 LAB — VITAMIN B12: Vitamin B-12: 293 pg/mL (ref 180–914)

## 2019-07-28 LAB — FOLATE: Folate: 11.2 ng/mL (ref >5.9)

## 2019-07-28 MED ORDER — SODIUM CHLORIDE 0.9% FLUSH
10.0000 mL | Freq: Once | INTRAVENOUS | Status: AC
  Administered 2019-07-28: 10 mL via INTRAVENOUS

## 2019-07-28 MED ORDER — HEPARIN SOD (PORK) LOCK FLUSH 100 UNIT/ML IV SOLN
500.0000 [IU] | Freq: Once | INTRAVENOUS | Status: AC
  Administered 2019-07-28: 500 [IU] via INTRAVENOUS

## 2019-07-28 NOTE — Progress Notes (Signed)
Pt saw PA Tom today prior to port flush.  Patients port flushed without difficulty.  No blood return noted (per pt, no blood return since 2015) with no bruising or swelling noted at site.  VSS with discharge and left in a wheel chair with no s/s of distress noted

## 2019-07-28 NOTE — Progress Notes (Addendum)
t       [place], [facility] [address] [place] [address]  Adenocarcinoma of colon 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 -Stivarga discontinued because of unhealed wounds. - Restaging scans on 07/24/2019 demonstrated no new signs of disease. AND Chronic ulcers: -he had ulcers in the right upper thigh which completely healed since we stopped Stivarga.  He is no longer following with wound care. AND Cirrhosis of liver with portal HTN.  CURRENT STATUS: REDDING CLOE 79 y.o. male returns for followup of in follow-up of stage IV colon cancer currently on chemotherapy holiday which he is enjoying.  He denies any new lumps or bumps on his examination.  No new pain.  Admittedly, he reports that he could not tolerate Stivarga therapy which is not uncommon.  He denies any new cough or hemoptysis.  No new neurological deficits including headaches, dizziness, double vision, LOC, and seizure.  He reports difficulty with bowel movements and constipation.  He has tried Dulcolax but this causes him to have frequent, low quantity, loose stools.  He also reports to urinary frequency with episodes of incontinence.  Review of Systems  Constitutional: Negative.  Negative for chills, fever and weight loss.  HENT: Negative.   Eyes: Negative.   Respiratory: Negative.  Negative for cough.   Cardiovascular: Negative.  Negative for chest pain.   Gastrointestinal: Positive for constipation. Negative for blood in stool, diarrhea, melena, nausea and vomiting.  Genitourinary: Positive for frequency and urgency.  Musculoskeletal: Negative.   Skin: Negative.   Neurological: Negative.  Negative for weakness.  Endo/Heme/Allergies: Negative.   Psychiatric/Behavioral: Negative.     Past Medical History:  Diagnosis Date  . Adenocarcinoma of colon ([place])    RLL -  Pulmonary nodule (06/2015, concerning for mets vs primary; to see [facility] Dr. [name]); stage 2 adenocarinoma   2014 - Colon Cancer - surgery and chemo  . Blood infection ([place])   . Cirrhosis of liver ([place]) 07/28/2019  . Cystitis   . Diabetes mellitus without complication ([place])    Type II  . DVT (deep venous thrombosis) ([place]) 2014   post op  . Hematuria   . Hypertension   . Neuropathy   . Portal hypertension ([place]) 07/28/2019  . Prostatitis   . Pulmonary nodule   . Sleep apnea     Past Surgical History:  Procedure Laterality Date  . BACK SURGERY    . CATARACT EXTRACTION     left  . CATARACT EXTRACTION W/PHACO Right 04/11/2012   Procedure: CATARACT EXTRACTION PHACO AND INTRAOCULAR LENS PLACEMENT (IOC);  Surgeon: Tonny Branch, MD;  Location: [facility] ORS;  Service: Ophthalmology;  Laterality: Right;  CDE:62.48  . CERVICAL FUSION     c4-c5  . CHOLECYSTECTOMY    . COLON SURGERY  09/29/12   Colon resection for cancer  . COLONOSCOPY    . EYE SURGERY     cataract  . GIVENS CAPSULE STUDY N/A 03/15/2015   Procedure: GIVENS CAPSULE  STUDY;  Surgeon: Rogene Houston, MD;  Location: [facility] ENDO SUITE;  Service: Endoscopy;  Laterality: N/A;  730  . LEG SURGERY    . ORIF FEMUR FRACTURE     right  . POSTERIOR CERVICAL FUSION/FORAMINOTOMY N/A 07/27/2015   Procedure: Cervical four to Cervical seven Laminectomy with Cervical four to Thoracic one Dorsal internal fixation and fusion;  Surgeon: Kevan Ny Ditty, MD;  Location: [facility] ORS;  Service: Neurosurgery;  Laterality: N/A;  C4 to C7  Laminectomy with C4 to T1 Dorsal internal fixation and fusion    Family History  Problem Relation Age of Onset  . Heart disease Father   . Heart attack Father   . Alzheimer's disease Mother     Social History   Socioeconomic History  . Marital status: Single    Spouse name: Not on file  . Number of children: Not on file  . Years of education: Not on file  . Highest education level: Not on file  Occupational History  . Not on file  Tobacco Use  . Smoking status: Former Smoker    Packs/day: 1.00    Years: 55.00    Pack years: 55.00    Types: Cigarettes    Start date: 02/07/1955  . Smokeless tobacco: Never Used  . Tobacco comment: quit 2015  Substance and Sexual Activity  . Alcohol use: Not Currently    Alcohol/week: 3.0 standard drinks    Types: 3 Cans of beer per week  . Drug use: No  . Sexual activity: Yes    Birth control/protection: None  Other Topics Concern  . Not on file  Social History Narrative  . Not on file   Social Determinants of Health   Financial Resource Strain:   . Difficulty of Paying Living Expenses:   Food Insecurity:   . Worried About Charity fundraiser in the Last Year:   . Arboriculturist in the Last Year:   Transportation Needs:   . Film/video editor (Medical):   Marland Kitchen Lack of Transportation (Non-Medical):   Physical Activity:   . Days of Exercise per Week:   . Minutes of Exercise per Session:   Stress:   . Feeling of Stress :   Social Connections:   . Frequency of Communication with Friends and Family:   . Frequency of Social Gatherings with Friends and Family:   . Attends Religious Services:   . Active Member of Clubs or Organizations:   . Attends Archivist Meetings:   Marland Kitchen Marital Status:      PHYSICAL EXAMINATION  ECOG PERFORMANCE STATUS: 2 - Symptomatic, <50% confined to bed  Vitals:   07/28/19 1355  BP: (!) 156/57  Pulse: 85  Resp: 18  Temp: 98 F (36.7 C)  SpO2: 96%    GENERAL:alert, no distress, well  nourished, well developed, comfortable, cooperative, obese and in wheelchair, unaccompanied SKIN: skin color, texture, turgor are normal HEAD: Normocephalic EYES: normal EARS: External ears normal OROPHARYNX: Not examined, mask in place NECK: supple LYMPH:  not examined BREAST:not examined LUNGS: clear to auscultation  HEART: regular rate & rhythm ABDOMEN:normal bowel sounds BACK: Back symmetric, no curvature. EXTREMITIES:less then 2 second capillary refill, positive findings:  edema bilateral lower extremities, 1+ NEURO: alert & oriented x 3 with fluent speech    LABORATORY DATA: CBC    Component Value Date/Time   WBC 8.8 07/28/2019 1531   RBC 3.84 (L) 07/28/2019 1531   HGB 12.3 (L) 07/28/2019 1531  HCT 38.1 (L) 07/28/2019 1531   PLT 247 07/28/2019 1531   MCV 99.2 07/28/2019 1531   MCH 32.0 07/28/2019 1531   MCHC 32.3 07/28/2019 1531   RDW 13.9 07/28/2019 1531   LYMPHSABS 1.5 07/28/2019 1531   MONOABS 0.9 07/28/2019 1531   EOSABS 0.3 07/28/2019 1531   BASOSABS 0.1 07/28/2019 1531      Chemistry      Component Value Date/Time   NA 137 07/17/2019 1304   K 5.0 07/17/2019 1304   CL 106 07/17/2019 1304   CO2 24 07/17/2019 1304   BUN 26 (H) 07/17/2019 1304   CREATININE 1.03 07/17/2019 1304      Component Value Date/Time   CALCIUM 8.7 (L) 07/17/2019 1304   ALKPHOS 63 07/17/2019 1304   AST 22 07/17/2019 1304   ALT 17 07/17/2019 1304   BILITOT 0.7 07/17/2019 1304       RADIOGRAPHIC STUDIES:  CT Chest W Contrast  Result Date: 07/24/2019 CLINICAL DATA:  History of stage IV colon cancer. Post radiation and abdominal surgery. EXAM: CT CHEST, ABDOMEN, AND PELVIS WITH CONTRAST TECHNIQUE: Multidetector CT imaging of the chest, abdomen and pelvis was performed following the standard protocol during bolus administration of intravenous contrast. CONTRAST:  162mL OMNIPAQUE IOHEXOL 300 MG/ML  SOLN COMPARISON:  Multiple prior studies most recent study of 03/21/2019 FINDINGS:  CT CHEST FINDINGS Cardiovascular: Heart size is stable. Three-vessel coronary artery disease. Calcific and noncalcific atheromatous plaque of the thoracic aorta. No aneurysm. RIGHT IJ Port-A-Cath terminates in the distal RIGHT brachiocephalic vein at the brachiocephalic junction. Position unchanged. Heart size is stable no pericardial effusion. Venous phase assessment of central pulmonary vasculature is unremarkable. Mediastinum/Nodes: Thoracic inlet structures are normal. No axillary lymphadenopathy. No hilar lymphadenopathy. No mediastinal adenopathy. Lungs/Pleura: Pleural and parenchymal scarring in the RIGHT chest and subpleural reticulation is similar. Consolidative changes at the RIGHT lung base and small RIGHT-sided pleural effusion are stable. Airways are patent. Musculoskeletal: No chest wall mass. CT ABDOMEN PELVIS FINDINGS Hepatobiliary: Lobular hepatic contours. Stable small potentially calcified lesion along the posterior margin of the RIGHT hemi liver is unchanged measuring approximately 8 mm. Lobular hepatic contours also unchanged with some caudate hypertrophy. Post cholecystectomy without biliary ductal dilation. Pancreas: Pancreas is normal. Spleen: Spleen normal size and contour. Adrenals/Urinary Tract: Adrenal glands are normal. Renal cortical scarring bilaterally similar to the prior study. No hydronephrosis. No nephrolithiasis. Urinary bladder under distended but otherwise unremarkable. Stomach/Bowel: Small hiatal hernia. No bowel obstruction. Stool through much of the colon. 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 Calcific and noncalcific atheromatous plaque of the abdominal aorta. No aneurysm. No adenopathy. No adenopathy in the upper abdomen or retroperitoneum. No pelvic adenopathy. Reproductive: Post hysterectomy. Other: No ascites. Stable small omental  nodule along the RIGHT hemiabdomen (image 72, series 2) also calcified area along the anterior surface of the descending colon also stable potentially a calcified diverticulum or peritoneal nodule. Musculoskeletal: No acute bone finding. No destructive bone process. Signs of prior trauma to the RIGHT proximal femur. Signs of prior trauma about the LEFT hip with calcifications in chronic seroma/sequela of Morel Lavallee lesion. Cervical spinal fusion with kyphotic angulation at the T1-T2 and fusion extending through the T1 level with similar appearance. Signs of prior trauma to the LEFT scapula as well. IMPRESSION: 1. Stable effusion and presumed post treatment changes in the LEFT chest. 2. No new signs of disease or  change from the previous study. 3. Signs of cirrhosis and portal hypertension. 4. 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 5. Three-vessel coronary artery disease. 6. Aortic atherosclerosis. Aortic Atherosclerosis (ICD10-I70.0). Electronically Signed   By: Zetta Bills M.D.   On: 07/24/2019 21:55   CT Abdomen Pelvis W Contrast  Result Date: 07/24/2019 CLINICAL DATA:  History of stage IV colon cancer. Post radiation and abdominal surgery. EXAM: CT CHEST, ABDOMEN, AND PELVIS WITH CONTRAST TECHNIQUE: Multidetector CT imaging of the chest, abdomen and pelvis was performed following the standard protocol during bolus administration of intravenous contrast. CONTRAST:  144mL OMNIPAQUE IOHEXOL 300 MG/ML  SOLN COMPARISON:  Multiple prior studies most recent study of 03/21/2019 FINDINGS: CT CHEST FINDINGS Cardiovascular: Heart size is stable. Three-vessel coronary artery disease. Calcific and noncalcific atheromatous plaque of the thoracic aorta. No aneurysm. RIGHT IJ Port-A-Cath terminates in the distal RIGHT brachiocephalic vein at the brachiocephalic junction. Position unchanged. Heart size is stable no pericardial effusion. Venous phase assessment of central pulmonary vasculature is unremarkable.  Mediastinum/Nodes: Thoracic inlet structures are normal. No axillary lymphadenopathy. No hilar lymphadenopathy. No mediastinal adenopathy. Lungs/Pleura: Pleural and parenchymal scarring in the RIGHT chest and subpleural reticulation is similar. Consolidative changes at the RIGHT lung base and small RIGHT-sided pleural effusion are stable. Airways are patent. Musculoskeletal: No chest wall mass. CT ABDOMEN PELVIS FINDINGS Hepatobiliary: Lobular hepatic contours. Stable small potentially calcified lesion along the posterior margin of the RIGHT hemi liver is unchanged measuring approximately 8 mm. Lobular hepatic contours also unchanged with some caudate hypertrophy. Post cholecystectomy without biliary ductal dilation. Pancreas: Pancreas is normal. Spleen: Spleen normal size and contour. Adrenals/Urinary Tract: Adrenal glands are normal. Renal cortical scarring bilaterally similar to the prior study. No hydronephrosis. No nephrolithiasis. Urinary bladder under distended but otherwise unremarkable. Stomach/Bowel: Small hiatal hernia. No bowel obstruction. Stool through much of the colon. 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 Calcific and noncalcific atheromatous plaque of the abdominal aorta. No aneurysm. No adenopathy. No adenopathy in the upper abdomen or retroperitoneum. No pelvic adenopathy. Reproductive: Post hysterectomy. Other: No ascites. Stable small omental nodule along the RIGHT hemiabdomen (image 72, series 2) also calcified area along the anterior surface of the descending colon also stable potentially a calcified diverticulum or peritoneal nodule. Musculoskeletal: No acute bone finding. No destructive bone process. Signs of prior trauma to the RIGHT proximal femur. Signs of prior trauma about the LEFT hip with calcifications in chronic seroma/sequela of Morel Lavallee  lesion. Cervical spinal fusion with kyphotic angulation at the T1-T2 and fusion extending through the T1 level with similar appearance. Signs of prior trauma to the LEFT scapula as well. IMPRESSION: 1. Stable effusion and presumed post treatment changes in the LEFT chest. 2. No new signs of disease or change from the previous study. 3. Signs of cirrhosis and portal hypertension. 4. 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 5. Three-vessel coronary artery disease. 6. Aortic atherosclerosis. Aortic Atherosclerosis (ICD10-I70.0). Electronically Signed   By: Zetta Bills M.D.   On: 07/24/2019 21:55     PATHOLOGY:    ASSESSMENT AND PLAN:  1. 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 -Stivarga discontinued because of unhealed wounds. - Restaging scans on 07/24/2019 demonstrated no new signs of disease.  Labs on 07/17/2019 reviewed: WBC 8.8 with normal differential, Hgb 11.6 g/dL, and  platelet count 246K.  MCV elevated at 100.3.  Metabolic panel impressive for a minimal hypocalcemia with low albumin and stable renal function.  CEA WNL and stable at 3.8.  CT CAP was negative for new or progressive malignancy.  Other findings as documented below.  Will continue to HOLD therapy at this time.  Will perform the following labs today with port flush: CBC diff, iron/TIBC, ferritin, B12, MMA, and folate for his macrocytic anemia which appears to be progressive.  Port flush every 6-8 weeks  Labs in 4 months: CBC diff, CMET, CEA.  Will repeat imaging in 4 months: CT CAP w contrast.  Return in 4 months for follow-up.   For his urinary frequency and incontinence, will refer patient to urology.   2. Cirrhosis of liver without ascites, unspecified hepatic cirrhosis type ([place]) Noted on imaging, possible causing gastropathy/esophageal varices with increased risk of GI blood loss.  Will check iron  studies in near future for progressive anemia.  3. Portal hypertension ([place]) Noted on imaging.   ORDERS PLACED FOR THIS ENCOUNTER: Orders Placed This Encounter  Procedures  . CT Abdomen Pelvis W Contrast  . CT Chest W Contrast  . CBC with Differential  . Iron and TIBC  . Ferritin  . Vitamin B12  . Methylmalonic acid, serum  . Folate  . CBC with Differential  . Comprehensive metabolic panel  . CEA    MEDICATIONS PRESCRIBED THIS ENCOUNTER: No orders of the defined types were placed in this encounter.   All questions were answered. The patient knows to call the clinic with any problems, questions or concerns. We can certainly see the patient much sooner if necessary.  Patient and plan discussed with Dr. Derek Jack and she is in agreement with the aforementioned.   This note is electronically signed by: Robynn Pane, PA-C 07/28/2019 4:09 PM

## 2019-07-28 NOTE — Patient Instructions (Signed)
[place] at [facility] Discharge Instructions  You were seen today by Kirby Crigler PA. He went over your recent test results. Start taking Senokot-S two tabs daily for constipation. He will see you back in 4 months for labs, scan and follow up.   Thank you for choosing [place] at [facility] to provide your oncology and hematology care.  To afford each patient quality time with our provider, please arrive at least 15 minutes before your scheduled appointment time.   If you have a lab appointment with the [facility] please come in thru the  Main Entrance and check in at the main information desk  You need to re-schedule your appointment should you arrive 10 or more minutes late.  We strive to give you quality time with our providers, and arriving late affects you and other patients whose appointments are after yours.  Also, if you no show three or more times for appointments you may be dismissed from the clinic at the providers discretion.     Again, thank you for choosing [facility].  Our hope is that these requests will decrease the amount of time that you wait before being seen by our physicians.       _____________________________________________________________  Should you have questions after your visit to [facility], please contact our office at [phone] between the hours of 8:00 a.m. and 4:30 p.m.  Voicemails left after 4:00 p.m. will not be returned until the following business day.  For prescription refill requests, have your pharmacy contact our office and allow 72 hours.    Cancer Center Support Programs:   > Cancer Support Group  2nd Tuesday of the month 1pm-2pm, Journey Room

## 2019-07-31 ENCOUNTER — Encounter (HOSPITAL_COMMUNITY): Payer: Self-pay | Admitting: Oncology

## 2019-07-31 ENCOUNTER — Other Ambulatory Visit (HOSPITAL_COMMUNITY): Payer: Self-pay | Admitting: Oncology

## 2019-07-31 DIAGNOSIS — E611 Iron deficiency: Secondary | ICD-10-CM

## 2019-07-31 HISTORY — DX: Iron deficiency: E61.1

## 2019-08-01 LAB — METHYLMALONIC ACID, SERUM: Methylmalonic Acid, Quantitative: 1071 nmol/L — ABNORMAL HIGH (ref 0–378)

## 2019-08-05 ENCOUNTER — Inpatient Hospital Stay (HOSPITAL_COMMUNITY): Payer: Medicare Other

## 2019-08-05 VITALS — BP 139/45 | HR 68 | Temp 97.4°F | Resp 18

## 2019-08-05 DIAGNOSIS — C189 Malignant neoplasm of colon, unspecified: Secondary | ICD-10-CM | POA: Diagnosis not present

## 2019-08-05 DIAGNOSIS — E611 Iron deficiency: Secondary | ICD-10-CM

## 2019-08-05 MED ORDER — SODIUM CHLORIDE 0.9 % IV SOLN
510.0000 mg | Freq: Once | INTRAVENOUS | Status: AC
  Administered 2019-08-05: 510 mg via INTRAVENOUS
  Filled 2019-08-05: qty 510

## 2019-08-05 MED ORDER — SODIUM CHLORIDE 0.9% FLUSH
10.0000 mL | Freq: Once | INTRAVENOUS | Status: AC
  Administered 2019-08-05: 10 mL via INTRAVENOUS

## 2019-08-05 MED ORDER — SODIUM CHLORIDE 0.9 % IV SOLN: INTRAVENOUS | Status: DC

## 2019-08-05 NOTE — Patient Instructions (Signed)
[place] at [facility]  Discharge Instructions:  IV ion received today. Ferumoxytol injection What is this medicine? FERUMOXYTOL is an iron complex. Iron is used to make healthy red blood cells, which carry oxygen and nutrients throughout the body. This medicine is used to treat iron deficiency anemia. This medicine may be used for other purposes; ask your health care provider or pharmacist if you have questions. COMMON BRAND NAME(S): Feraheme What should I tell my health care provider before I take this medicine? They need to know if you have any of these conditions:  anemia not caused by low iron levels  high levels of iron in the blood  magnetic resonance imaging (MRI) test scheduled  an unusual or allergic reaction to iron, other medicines, foods, dyes, or preservatives  pregnant or trying to get pregnant  breast-feeding How should I use this medicine? This medicine is for injection into a vein. It is given by a health care professional in a hospital or clinic setting. Talk to your pediatrician regarding the use of this medicine in children. Special care may be needed. Overdosage: If you think you have taken too much of this medicine contact a poison control center or emergency room at once. NOTE: This medicine is only for you. Do not share this medicine with others. What if I miss a dose? It is important not to miss your dose. Call your doctor or health care professional if you are unable to keep an appointment. What may interact with this medicine? This medicine may interact with the following medications:  other iron products This list may not describe all possible interactions. Give your health care provider a list of all the medicines, herbs, non-prescription drugs, or dietary supplements you use. Also tell them if you smoke, drink alcohol, or use illegal drugs. Some items may interact with your medicine. What should I watch for while using this  medicine? Visit your doctor or healthcare professional regularly. Tell your doctor or healthcare professional if your symptoms do not start to get better or if they get worse. You may need blood work done while you are taking this medicine. You may need to follow a special diet. Talk to your doctor. Foods that contain iron include: whole grains/cereals, dried fruits, beans, or peas, leafy green vegetables, and organ meats (liver, kidney). What side effects may I notice from receiving this medicine? Side effects that you should report to your doctor or health care professional as soon as possible:  allergic reactions like skin rash, itching or hives, swelling of the face, lips, or tongue  breathing problems  changes in blood pressure  feeling faint or lightheaded, falls  fever or chills  flushing, sweating, or hot feelings  swelling of the ankles or feet Side effects that usually do not require medical attention (report to your doctor or health care professional if they continue or are bothersome):  diarrhea  headache  nausea, vomiting  stomach pain This list may not describe all possible side effects. Call your doctor for medical advice about side effects. You may report side effects to FDA at 1-800-FDA-1088. Where should I keep my medicine? This drug is given in a hospital or clinic and will not be stored at home. NOTE: This sheet is a summary. It may not cover all possible information. If you have questions about this medicine, talk to your doctor, pharmacist, or health care provider.  2020 Elsevier/Gold Standard (2016-03-13 20:21:10)  _______________________________________________________________  Thank you for choosing [facility]  [place] at [facility] to provide your oncology and hematology care.  To afford each patient quality time with our providers, please arrive at least 15 minutes before your scheduled appointment.  You need to re-schedule your  appointment if you arrive 10 or more minutes late.  We strive to give you quality time with our providers, and arriving late affects you and other patients whose appointments are after yours.  Also, if you no show three or more times for appointments you may be dismissed from the clinic.  Again, thank you for choosing [place] at [place] hope is that these requests will allow you access to exceptional care and in a timely manner. _______________________________________________________________  If you have questions after your visit, please contact our office at [phone] between the hours of 8:30 a.m. and 5:00 p.m. Voicemails left after 4:30 p.m. will not be returned until the following business day. _______________________________________________________________  For prescription refill requests, have your pharmacy contact our office. _______________________________________________________________  Recommendations made by the consultant and any test results will be sent to your referring physician. _______________________________________________________________

## 2019-08-05 NOTE — Progress Notes (Signed)
Gary Nelson presents today for IV iron infusion. Infusion tolerated without incident or complaint. See MAR for details. VSS prior to and post infusion. Discharged in satisfactory condition with follow up instructions.

## 2019-08-12 ENCOUNTER — Other Ambulatory Visit (HOSPITAL_COMMUNITY): Payer: Self-pay | Admitting: *Deleted

## 2019-08-12 MED ORDER — VITAMIN B-12 1000 MCG PO TABS
1000.0000 ug | ORAL_TABLET | Freq: Every day | ORAL | 3 refills | Status: DC
Start: 2019-08-12 — End: 2020-04-26

## 2019-10-28 ENCOUNTER — Inpatient Hospital Stay (HOSPITAL_COMMUNITY): Payer: Medicare Other | Attending: Hematology

## 2019-10-28 ENCOUNTER — Encounter (HOSPITAL_COMMUNITY): Payer: Self-pay

## 2019-10-28 ENCOUNTER — Other Ambulatory Visit: Payer: Self-pay

## 2019-10-28 DIAGNOSIS — K7460 Unspecified cirrhosis of liver: Secondary | ICD-10-CM | POA: Diagnosis not present

## 2019-10-28 DIAGNOSIS — C189 Malignant neoplasm of colon, unspecified: Secondary | ICD-10-CM | POA: Diagnosis present

## 2019-10-28 DIAGNOSIS — C7800 Secondary malignant neoplasm of unspecified lung: Secondary | ICD-10-CM | POA: Diagnosis present

## 2019-10-28 DIAGNOSIS — Z79899 Other long term (current) drug therapy: Secondary | ICD-10-CM | POA: Diagnosis not present

## 2019-10-28 DIAGNOSIS — K766 Portal hypertension: Secondary | ICD-10-CM | POA: Insufficient documentation

## 2019-10-28 MED ORDER — HEPARIN SOD (PORK) LOCK FLUSH 100 UNIT/ML IV SOLN
500.0000 [IU] | Freq: Once | INTRAVENOUS | Status: AC
  Administered 2019-10-28: 500 [IU] via INTRAVENOUS

## 2019-10-28 MED ORDER — SODIUM CHLORIDE 0.9% FLUSH
10.0000 mL | INTRAVENOUS | Status: DC | PRN
  Administered 2019-10-28: 10 mL via INTRAVENOUS

## 2019-10-28 NOTE — Patient Instructions (Signed)
[place] at [facility] Discharge Instructions  Portacath flushed today per protocol. Follow-up as scheduled   Thank you for choosing [place] at [facility] to provide your oncology and hematology care.  To afford each patient quality time with our provider, please arrive at least 15 minutes before your scheduled appointment time.   If you have a lab appointment with the [facility] please come in thru the Main Entrance and check in at the main information desk.  You need to re-schedule your appointment should you arrive 10 or more minutes late.  We strive to give you quality time with our providers, and arriving late affects you and other patients whose appointments are after yours.  Also, if you no show three or more times for appointments you may be dismissed from the clinic at the providers discretion.     Again, thank you for choosing [facility].  Our hope is that these requests will decrease the amount of time that you wait before being seen by our physicians.       _____________________________________________________________  Should you have questions after your visit to [facility], please contact our office at [phone] and follow the prompts.  Our office hours are 8:00 a.m. and 4:30 p.m. Monday - Friday.  Please note that voicemails left after 4:00 p.m. may not be returned until the following business day.  We are closed weekends and major holidays.  You do have access to a nurse 24-7, just call the main number to the clinic [phone] and do not press any options, hold on the line and a nurse will answer the phone.    For prescription refill requests, have your pharmacy contact our office and allow 72 hours.    Due to Covid, you will need to wear a mask upon entering the hospital. If you do not have a mask, a mask will be given to you at the Main Entrance upon arrival. For doctor visits, patients may  have 1 support person age 18 or older with them. For treatment visits, patients can not have anyone with them due to social distancing guidelines and our immunocompromised population.

## 2019-10-28 NOTE — Progress Notes (Signed)
Gary Nelson tolerated portacath flush well without complaints or incident. Port accessed with 20 gauge needle, no blood return noted but flushed easily per protocol without c/o pain or swelling and de-accessed. VSS Pt discharged via wheelchair in satisfactory condition

## 2019-11-27 ENCOUNTER — Inpatient Hospital Stay (HOSPITAL_COMMUNITY): Payer: Medicare Other | Attending: Hematology

## 2019-11-27 ENCOUNTER — Other Ambulatory Visit: Payer: Self-pay

## 2019-11-27 ENCOUNTER — Ambulatory Visit (HOSPITAL_COMMUNITY)
Admission: RE | Admit: 2019-11-27 | Discharge: 2019-11-27 | Disposition: A | Discharge status: Home / Self Care | Payer: Medicare Other | Source: Ambulatory Visit | Attending: Hematology | Admitting: Hematology

## 2019-11-27 DIAGNOSIS — K7460 Unspecified cirrhosis of liver: Secondary | ICD-10-CM | POA: Diagnosis not present

## 2019-11-27 DIAGNOSIS — K766 Portal hypertension: Secondary | ICD-10-CM | POA: Diagnosis not present

## 2019-11-27 DIAGNOSIS — M2550 Pain in unspecified joint: Secondary | ICD-10-CM | POA: Insufficient documentation

## 2019-11-27 DIAGNOSIS — Z79899 Other long term (current) drug therapy: Secondary | ICD-10-CM | POA: Insufficient documentation

## 2019-11-27 DIAGNOSIS — C7801 Secondary malignant neoplasm of right lung: Secondary | ICD-10-CM | POA: Diagnosis not present

## 2019-11-27 DIAGNOSIS — M7989 Other specified soft tissue disorders: Secondary | ICD-10-CM | POA: Diagnosis not present

## 2019-11-27 DIAGNOSIS — R0602 Shortness of breath: Secondary | ICD-10-CM | POA: Diagnosis not present

## 2019-11-27 DIAGNOSIS — Z8249 Family history of ischemic heart disease and other diseases of the circulatory system: Secondary | ICD-10-CM | POA: Insufficient documentation

## 2019-11-27 DIAGNOSIS — R059 Cough, unspecified: Secondary | ICD-10-CM | POA: Diagnosis not present

## 2019-11-27 DIAGNOSIS — Z87891 Personal history of nicotine dependence: Secondary | ICD-10-CM | POA: Diagnosis not present

## 2019-11-27 DIAGNOSIS — Z818 Family history of other mental and behavioral disorders: Secondary | ICD-10-CM | POA: Diagnosis not present

## 2019-11-27 DIAGNOSIS — R5383 Other fatigue: Secondary | ICD-10-CM | POA: Insufficient documentation

## 2019-11-27 DIAGNOSIS — E669 Obesity, unspecified: Secondary | ICD-10-CM | POA: Insufficient documentation

## 2019-11-27 DIAGNOSIS — Z7289 Other problems related to lifestyle: Secondary | ICD-10-CM | POA: Insufficient documentation

## 2019-11-27 DIAGNOSIS — C189 Malignant neoplasm of colon, unspecified: Secondary | ICD-10-CM | POA: Diagnosis present

## 2019-11-27 DIAGNOSIS — R600 Localized edema: Secondary | ICD-10-CM | POA: Insufficient documentation

## 2019-11-27 DIAGNOSIS — I700 Atherosclerosis of aorta: Secondary | ICD-10-CM | POA: Diagnosis not present

## 2019-11-27 LAB — CBC WITH DIFFERENTIAL/PLATELET
Abs Immature Granulocytes: 0.03 10*3/uL (ref 0.00–0.07)
Basophils Absolute: 0.1 10*3/uL (ref 0.0–0.1)
Basophils Relative: 1 %
Eosinophils Absolute: 0.4 10*3/uL (ref 0.0–0.5)
Eosinophils Relative: 5 %
HCT: 37.4 % — ABNORMAL LOW (ref 39.0–52.0)
Hemoglobin: 12 g/dL — ABNORMAL LOW (ref 13.0–17.0)
Immature Granulocytes: 0 %
Lymphocytes Relative: 19 %
Lymphs Abs: 1.4 10*3/uL (ref 0.7–4.0)
MCH: 31.9 pg (ref 26.0–34.0)
MCHC: 32.1 g/dL (ref 30.0–36.0)
MCV: 99.5 fL (ref 80.0–100.0)
Monocytes Absolute: 0.8 10*3/uL (ref 0.1–1.0)
Monocytes Relative: 11 %
Neutro Abs: 4.8 10*3/uL (ref 1.7–7.7)
Neutrophils Relative %: 64 %
Platelets: 196 10*3/uL (ref 150–400)
RBC: 3.76 MIL/uL — ABNORMAL LOW (ref 4.22–5.81)
RDW: 13.6 % (ref 11.5–15.5)
WBC: 7.5 10*3/uL (ref 4.0–10.5)
nRBC: 0 % (ref 0.0–0.2)

## 2019-11-27 LAB — COMPREHENSIVE METABOLIC PANEL
ALT: 17 U/L (ref 0–44)
AST: 21 U/L (ref 15–41)
Albumin: 3.4 g/dL — ABNORMAL LOW (ref 3.5–5.0)
Alkaline Phosphatase: 57 U/L (ref 38–126)
Anion gap: 9 (ref 5–15)
BUN: 27 mg/dL — ABNORMAL HIGH (ref 8–23)
CO2: 26 mmol/L (ref 22–32)
Calcium: 9 mg/dL (ref 8.9–10.3)
Chloride: 104 mmol/L (ref 98–111)
Creatinine, Ser: 0.97 mg/dL (ref 0.61–1.24)
GFR, Estimated: >60 mL/min (ref >60)
Glucose, Bld: 93 mg/dL (ref 70–99)
Potassium: 4.9 mmol/L (ref 3.5–5.1)
Sodium: 139 mmol/L (ref 135–145)
Total Bilirubin: 0.8 mg/dL (ref 0.3–1.2)
Total Protein: 6.4 g/dL — ABNORMAL LOW (ref 6.5–8.1)

## 2019-11-27 MED ORDER — IOHEXOL 300 MG/ML  SOLN
100.0000 mL | Freq: Once | INTRAMUSCULAR | Status: AC | PRN
  Administered 2019-11-27: 100 mL via INTRAVENOUS

## 2019-11-28 LAB — CEA: CEA: 3.7 ng/mL (ref 0.0–4.7)

## 2019-12-04 ENCOUNTER — Inpatient Hospital Stay (HOSPITAL_BASED_OUTPATIENT_CLINIC_OR_DEPARTMENT_OTHER): Payer: Medicare Other | Admitting: Hematology

## 2019-12-04 ENCOUNTER — Other Ambulatory Visit: Payer: Self-pay

## 2019-12-04 VITALS — BP 155/70 | HR 96 | Temp 97.0°F | Resp 20 | Wt 253.3 lb

## 2019-12-04 DIAGNOSIS — C189 Malignant neoplasm of colon, unspecified: Secondary | ICD-10-CM

## 2019-12-04 MED ORDER — CVS B-12 5000 MCG SL SUBL: 1.0000 | SUBLINGUAL_TABLET | Freq: Every day | SUBLINGUAL | 6 refills | Status: AC

## 2019-12-04 NOTE — Progress Notes (Signed)
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 Partial colectomy on 10/04/2012. 2. FOLFOX in 2014. 3. SBRT to right lower lung from 10/12/2015 to 10/22/2015. 4. SBRT 50 Gy in 5 fractions to right lower lung from 01/02/2017 to 01/12/2017. Stivarga 80 mg 3 weeks on/1 week off from 03/2018 to 12/17/2018.  NGS Results: Not done  CURRENT THERAPY: Observation  BRIEF ONCOLOGIC HISTORY:  Oncology History  Adenocarcinoma of colon ([place])  10/04/2012 Definitive Surgery   Partial collectomy by Dr. [name]   10/04/2012 Pathology Results   4.5 cm poorly differentiated adenocarcinoma, negative margins, 0/7 lymph nodes for metastatic disease.  Oncotype recurrence score of 26 (high)    Chemotherapy   FOLFOX    Adverse Reaction   Progressive peripheral neuropathy    Chemotherapy   Infusional 5 FU.  Completed 6 months worth of adjuvant therapy.   02/23/2014 Procedure   Colonoscopy by Dr. Anthony Sar.   07/01/2015 PET scan   There is malignant range uptake with RLL pulmonary nodule suspicious for metastatic disease or primary pulm neoplasm. Nonspecific focus of uptake in the L femoral neck. No corresponding CT abnormality. Cannot rule out metastasis.   07/06/2015 Imaging   MR C-spine- C5-6 there is a broad-based disc bulge w R paracentral disc protrusion deforming the spinal cord. Moderate R foraminal stenosis. C6-7 there is a central disc protrusion slightly eccentric towards the R and mildly deforming the ventral c-spine    09/16/2015 Procedure   RLL needle biopsy by IR.   09/17/2015 Pathology Results   Metastatic adenocarcinoma consistent with a primary colorectal adenocarcinoma.   10/12/2015 - 10/22/2015 Radiation Therapy   SBRT by Dr. [name] to RLL  pulmonary lesion (biopsy proven to be oligometastasis).   12/09/2015 Imaging   CT chest- 1. Slight interval decrease in size of the right lower lobe pulmonary nodule. Some surrounding radiation changes. 2. No mediastinal or hilar mass or adenopathy. 3. No new pulmonary nodules. 4. Stable underline emphysematous changes. 5. Cirrhotic changes involving the liver but no upper abdominal metastatic disease.   03/14/2016 Imaging   CT chest- 1. Continued mild reduction in the size of the treated right lobe pulmonary nodule. 2. Increased patchy consolidation, reticulation and ground-glass attenuation in the basilar right lower lobe, nonspecific, favor evolving postradiation change, recommend attention on follow-up chest CT. 3. No evidence of new or progressive metastatic disease in the chest. 4. Aortic atherosclerosis. Left main and 3 vessel coronary atherosclerosis. 5. Mild emphysema.   06/30/2016 Imaging   CT CAP- 1. The appearance of the treated right lower lobe nodule is essentially unchanged, and there are evolving postradiation changes in the base of the right lower lobe, as above. No definite signs of new metastatic disease are noted elsewhere in the chest, abdomen or pelvis. 2. Aortic atherosclerosis, in addition to left main and 3 vessel coronary artery disease. Assessment for potential risk factor modification, dietary therapy or pharmacologic therapy may be warranted, if clinically indicated. 3. Morphologic changes in the liver suggestive of cirrhosis, as above. 4. Additional incidental findings, similar prior studies, as above.   11/17/2016 Imaging   CT CAP Study Result   CLINICAL DATA:  Stage IV colon cancer originally diagnosed in August 2014 status post partial colectomy, with right  lower lobe lung metastasis diagnosed August 2017 status post SBRT completed 10/22/2015. Restaging.  EXAM: CT CHEST, ABDOMEN, AND PELVIS WITH CONTRAST  TECHNIQUE: Multidetector CT  imaging of the chest, abdomen and pelvis was performed following the standard protocol during bolus administration of intravenous contrast.  CONTRAST:  159mL ISOVUE-300 IOPAMIDOL (ISOVUE-300) INJECTION 61%  COMPARISON:  06/30/2016 CT chest, abdomen and pelvis.  FINDINGS: CT CHEST FINDINGS  Cardiovascular: Normal heart size. No significant pericardial fluid/thickening. Left main, left anterior descending, left circumflex and right coronary atherosclerosis. Atherosclerotic nonaneurysmal thoracic aorta. Normal caliber pulmonary arteries. No central pulmonary emboli. Right internal jugular MediPort terminates at the junction of the right and left brachiocephalic veins.  Mediastinum/Nodes: No discrete thyroid nodules. Unremarkable esophagus. No pathologically enlarged axillary, mediastinal or hilar lymph nodes.  Lungs/Pleura: No pneumothorax. No pleural effusion. There is a nodular 2.7 x 1.6 cm focus of consolidation in the medial basilar right lower lobe (series 3/ image 108), previously 1.6 x 1.1 cm. No appreciable change in patchy subpleural reticulation in both lungs without significant traction bronchiectasis or frank honeycombing. Mild centrilobular and paraseptal emphysema with mild diffuse bronchial wall thickening. No acute consolidative airspace disease or new significant pulmonary nodules.  Musculoskeletal: No aggressive appearing focal osseous lesions. Moderate thoracic spondylosis. Stable chronic left scapular deformity.  CT ABDOMEN PELVIS FINDINGS  Hepatobiliary: Normal liver with no liver mass. Cholecystectomy. No biliary ductal dilatation.  Pancreas: Normal, with no mass or duct dilation.  Spleen: Normal size. No mass.  Adrenals/Urinary Tract: Normal adrenals. No hydronephrosis. No renal masses. Normal bladder.  Stomach/Bowel: Grossly normal stomach. Normal caliber small bowel with no small bowel wall thickening. Stable appearance of  the appendix, within normal limits. Stable postsurgical changes from partial distal colectomy with intact appearing distal colonic anastomosis. Oral contrast transits to the cecum. No large bowel wall thickening or new pericholecystic fat stranding .  Vascular/Lymphatic: Atherosclerotic nonaneurysmal abdominal aorta. Patent portal, splenic, hepatic and renal veins. No pathologically enlarged lymph nodes in the abdomen or pelvis.  Reproductive:  Normal size prostate.  Other: No pneumoperitoneum, ascites or focal fluid collection. Stable subcutaneous calcified granulomas throughout the left lower back and gluteal regions.  Musculoskeletal: No aggressive appearing focal osseous lesions. Marked lumbar spondylosis.  IMPRESSION: 1. Nodular focus of consolidation in the medial basilar right lower lobe is increased in size. While this may represent evolving masslike radiation fibrosis, recurrence of the lung metastasis is not excluded. PET-CT would be useful for further evaluation. Otherwise, continued close chest CT surveillance is advised . 2. No additional potential new sites of metastatic disease in the chest, abdomen or pelvis. 3. No evidence of local tumor recurrence at the distal colonic anastomosis. 4. Left main and 3 vessel coronary atherosclerosis . 5. Aortic Atherosclerosis (ICD10-I70.0) and Emphysema (ICD10-J43.9).      11/30/2016 Relapse/Recurrence   PET: IMPRESSION: 1. Enlarging right lower lobe pulmonary nodule and slight increase in the SUV max suggesting residual neoplasm. No new pulmonary lesions. 2. No findings for local recurrence or abdominal/pelvic metastatic disease.   01/02/2017 - 01/12/2017 Radiation Therapy    The patient saw Dr. [name] for SBRT radiation treatment. The tumor in the right lung was treated with a course of stereotactic body radiation treatment. The patient received 50 Gy In 5 fractions at 10 Gy per fraction.      10/05/2017 -  10/05/2017 Chemotherapy   The patient had bevacizumab (AVASTIN) 900 mg in sodium chloride 0.9 % 100 mL chemo infusion, 7.3 mg/kg = 925 mg, Intravenous,  Once,  1 of 3 cycles Administration: 900 mg (10/05/2017)  for chemotherapy treatment.      CANCER STAGING: Cancer Staging Adenocarcinoma of colon [facility]) Staging form: Colon and Rectum, AJCC 7th Edition - Clinical stage from 10/04/2012: Stage IIA (T3, N0, M0) - Signed by Baird Cancer, PA-C on 07/15/2015 - Pathologic stage from 09/17/2015: Stage IVA (T3, N0, M1a) - Signed by Baird Cancer, PA-C on 10/05/2015   INTERVAL HISTORY:  Mr. KANAN SOBEK, a 79 y.o. 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 Timonthy was last seen on 03/26/2019.   Today he is accompanied by his son and he reports feeling well. The wound in his right thigh is healed up well. The swelling in his legs is stable.   REVIEW OF SYSTEMS:  Review of Systems  Constitutional: Positive for fatigue (depleted). Negative for appetite change.  Respiratory: Positive for cough and shortness of breath.   Cardiovascular: Positive for leg swelling.  Musculoskeletal: Positive for arthralgias (2/10 hips pain).  All other systems reviewed and are negative.   PAST MEDICAL/SURGICAL HISTORY:  Past Medical History:  Diagnosis Date  . Adenocarcinoma of colon ([place])    RLL -  Pulmonary nodule (06/2015, concerning for mets vs primary; to see RAD-[facility] Dr. [name]); stage 2 adenocarinoma   2014 - Colon Cancer - surgery and chemo  . Blood infection ([place])   . Cirrhosis of liver ([place]) 07/28/2019  . Cystitis   . Diabetes mellitus without complication ([place])    Type II  . DVT (deep venous thrombosis) ([place]) 2014   post op  . Hematuria   . Hypertension   . Iron deficiency 07/31/2019  . Neuropathy   . Portal hypertension ([place]) 07/28/2019  . Prostatitis   . Pulmonary nodule   . Sleep apnea    Past Surgical History:  Procedure Laterality Date  . BACK  SURGERY    . CATARACT EXTRACTION     left  . CATARACT EXTRACTION W/PHACO Right 04/11/2012   Procedure: CATARACT EXTRACTION PHACO AND INTRAOCULAR LENS PLACEMENT (IOC);  Surgeon: Tonny Branch, MD;  Location: [facility] ORS;  Service: Ophthalmology;  Laterality: Right;  CDE:62.48  . CERVICAL FUSION     c4-c5  . CHOLECYSTECTOMY    . COLON SURGERY  09/29/12   Colon resection for cancer  . COLONOSCOPY    . EYE SURGERY     cataract  . GIVENS CAPSULE STUDY N/A 03/15/2015   Procedure: GIVENS CAPSULE STUDY;  Surgeon: Rogene Houston, MD;  Location: [facility] ENDO SUITE;  Service: Endoscopy;  Laterality: N/A;  730  . LEG SURGERY    . ORIF FEMUR FRACTURE     right  . POSTERIOR CERVICAL FUSION/FORAMINOTOMY N/A 07/27/2015   Procedure: Cervical four to Cervical seven Laminectomy with Cervical four to Thoracic one Dorsal internal fixation and fusion;  Surgeon: Kevan Ny Ditty, MD;  Location: [facility] ORS;  Service: Neurosurgery;  Laterality: N/A;  C4 to C7 Laminectomy with C4 to T1 Dorsal internal fixation and fusion    SOCIAL HISTORY:  Social History   Socioeconomic History  . Marital status: Single    Spouse name: Not on file  . Number of children: Not on file  . Years of education: Not on file  . Highest education level: Not on file  Occupational History  . Not on file  Tobacco Use  . Smoking status: Former Smoker    Packs/day: 1.00    Years: 55.00    Pack years: 55.00  Types: Cigarettes    Start date: 02/07/1955  . Smokeless tobacco: Never Used  . Tobacco comment: quit 2015  Substance and Sexual Activity  . Alcohol use: Not Currently    Alcohol/week: 3.0 standard drinks    Types: 3 Cans of beer per week  . Drug use: No  . Sexual activity: Yes    Birth control/protection: None  Other Topics Concern  . Not on file  Social History Narrative  . Not on file   Social Determinants of Health   Financial Resource Strain:   . Difficulty of Paying Living Expenses: Not on file  Food Insecurity:   .  Worried About Charity fundraiser in the Last Year: Not on file  . Ran Out of Food in the Last Year: Not on file  Transportation Needs:   . Lack of Transportation (Medical): Not on file  . Lack of Transportation (Non-Medical): Not on file  Physical Activity:   . Days of Exercise per Week: Not on file  . Minutes of Exercise per Session: Not on file  Stress:   . Feeling of Stress : Not on file  Social Connections:   . Frequency of Communication with Friends and Family: Not on file  . Frequency of Social Gatherings with Friends and Family: Not on file  . Attends Religious Services: Not on file  . Active Member of Clubs or Organizations: Not on file  . Attends Archivist Meetings: Not on file  . Marital Status: Not on file  Intimate Partner Violence:   . Fear of Current or Ex-Partner: Not on file  . Emotionally Abused: Not on file  . Physically Abused: Not on file  . Sexually Abused: Not on file    FAMILY HISTORY:  Family History  Problem Relation Age of Onset  . Heart disease Father   . Heart attack Father   . Alzheimer's disease Mother     CURRENT MEDICATIONS:  Current Outpatient Medications  Medication Sig Dispense Refill  . aspirin EC 81 MG tablet Take 81 mg by mouth daily.    . [employer] B-12 5000 MCG SUBL Place 1 tablet (5,000 mcg total) under the tongue daily. 30 tablet 6  . furosemide (LASIX) 20 MG tablet Take 20 mg by mouth every other day.     . gabapentin (NEURONTIN) 300 MG capsule Take 2 capsules (600 mg total) by mouth 3 (three) times daily. (Patient taking differently: Take 600 mg by mouth 2 (two) times daily. ) 180 capsule 2  . HYDROcodone-acetaminophen (NORCO/VICODIN) 5-325 MG tablet Take 1 tablet by mouth as needed.     . insulin NPH (HUMULIN N,NOVOLIN N) 100 UNIT/ML injection Inject 15-30 Units into the skin See admin instructions. 25 units every morning and 14 units in the evening    . lisinopril (PRINIVIL,ZESTRIL) 40 MG tablet Take 40 mg by mouth daily.     . naproxen (NAPROSYN) 500 MG tablet TAKE 1 TABLET BY MOUTH TWICE A DAY WITH MEALS    . OXYGEN Inhale 2 L into the lungs continuous. At night time only    . silver sulfADIAZINE (SILVADENE) 1 % cream Apply 1 application topically as needed.     . tamsulosin (FLOMAX) 0.4 MG CAPS capsule Take by mouth.    . vitamin B-12 (CYANOCOBALAMIN) 1000 MCG tablet Take 1 tablet (1,000 mcg total) by mouth daily. 5,000 mcg sublingual daily 150 tablet 3  . atorvastatin (LIPITOR) 10 MG tablet Take 10 mg by mouth daily at 6 PM.  No current facility-administered medications for this visit.    ALLERGIES:  No Known Allergies  PHYSICAL EXAM:  Performance status (ECOG): 2 - Symptomatic, <50% confined to bed  Vitals:   12/04/19 1443  BP: (!) 155/70  Pulse: 96  Resp: 20  Temp: (!) 97 F (36.1 C)  SpO2: 97%   Wt Readings from Last 3 Encounters:  12/04/19 253 lb 4.9 oz (114.9 kg)  07/28/19 249 lb (112.9 kg)  03/26/19 244 lb 3.2 oz (110.8 kg)   Physical Exam Vitals reviewed.  Constitutional:      Appearance: Normal appearance. He is obese.  Cardiovascular:     Rate and Rhythm: Normal rate and regular rhythm.     Pulses: Normal pulses.     Heart sounds: Normal heart sounds.  Pulmonary:     Effort: Pulmonary effort is normal.     Breath sounds: Normal breath sounds.  Musculoskeletal:     Right lower leg: Edema (1+) present.     Left lower leg: Edema (1+) present.  Neurological:     General: No focal deficit present.     Mental Status: He is alert and oriented to person, place, and time.  Psychiatric:        Mood and Affect: Mood normal.        Behavior: Behavior normal.      LABORATORY DATA:  I have reviewed the labs as listed.  CBC Latest Ref Rng & Units 11/27/2019 07/28/2019 07/17/2019  WBC 4.0 - 10.5 K/uL 7.5 8.8 8.8  Hemoglobin 13.0 - 17.0 g/dL 12.0(L) 12.3(L) 11.6(L)  Hematocrit 39 - 52 % 37.4(L) 38.1(L) 36.5(L)  Platelets 150 - 400 K/uL 196 247 246   CMP Latest Ref Rng & Units  11/27/2019 07/17/2019 03/19/2019  Glucose 70 - 99 mg/dL 93 88 132(H)  BUN 8 - 23 mg/dL 27(H) 26(H) 26(H)  Creatinine 0.61 - 1.24 mg/dL 0.97 1.03 0.90  Sodium 135 - 145 mmol/L 139 137 136  Potassium 3.5 - 5.1 mmol/L 4.9 5.0 5.0  Chloride 98 - 111 mmol/L 104 106 105  CO2 22 - 32 mmol/L 26 24 24   Calcium 8.9 - 10.3 mg/dL 9.0 8.7(L) 8.8(L)  Total Protein 6.5 - 8.1 g/dL 6.4(L) 6.6 7.1  Total Bilirubin 0.3 - 1.2 mg/dL 0.8 0.7 0.7  Alkaline Phos 38 - 126 U/L 57 63 74  AST 15 - 41 U/L 21 22 22   ALT 0 - 44 U/L 17 17 16    Lab Results  Component Value Date   CEA1 3.7 11/27/2019   CEA1 3.8 07/17/2019   CEA1 LAMST 03/19/2019    DIAGNOSTIC IMAGING:  I have independently reviewed the scans and discussed with the patient. CT Chest W Contrast  Result Date: 11/28/2019 CLINICAL DATA:  Restaging colorectal cancer. EXAM: CT CHEST, ABDOMEN, AND PELVIS WITH CONTRAST TECHNIQUE: Multidetector CT imaging of the chest, abdomen and pelvis was performed following the standard protocol during bolus administration of intravenous contrast. CONTRAST:  123mL OMNIPAQUE IOHEXOL 300 MG/ML  SOLN COMPARISON:  07/24/2019 FINDINGS: CT CHEST FINDINGS Cardiovascular: The heart is normal in size. No pericardial effusion. The aorta is normal in caliber. No dissection. Stable atherosclerotic calcifications. Stable three-vessel coronary artery calcifications. Mediastinum/Nodes: No mediastinal or hilar mass or adenopathy. The esophagus is grossly normal. Lungs/Pleura: Stable small right pleural effusion with overlying chronic atelectasis. Possible radiation changes. No pulmonary nodules to suggest pulmonary metastatic disease. No acute pulmonary findings. Musculoskeletal: No chest wall mass, supraclavicular or axillary adenopathy. No significant bony findings. Cervical fusion hardware  is again noted. CT ABDOMEN PELVIS FINDINGS Hepatobiliary: Stable changes of cirrhosis involving the liver. No hepatic lesions or intrahepatic biliary  dilatation. The gallbladder is surgically absent. No common bile duct dilatation. Pancreas: No mass, inflammation or ductal dilatation. Spleen: Normal size.  No focal lesions. Adrenals/Urinary Tract: Adrenal glands and kidneys are unremarkable stable. The bladder appears. Stomach/Bowel: The stomach, duodenum, small bowel colon unremarkable. No acute inflammatory changes, mass lesions obstructive findings. Vascular/Lymphatic: Stable advanced atherosclerotic calcifications involving the aorta, iliac arteries and branch vessels but no aneurysm or dissection. The major venous structures are patent. No mesenteric or retroperitoneal mass or adenopathy. Reproductive: The prostate gland seminal vesicles unremarkable. Other: No pelvic mass or adenopathy. No free pelvic fluid collections. No inguinal mass or adenopathy. No abdominal wall hernia or subcutaneous lesions. Musculoskeletal: No significant bony findings. IMPRESSION: 1. Stable small right pleural effusion with overlying chronic atelectasis. Possible radiation changes. 2. No findings for pulmonary metastatic disease. 3. Stable changes of cirrhosis involving the liver. No hepatic lesions. 4. Stable advanced atherosclerotic calcifications involving the thoracic and abdominal aorta and branch vessels including the coronary arteries. 5. Aortic atherosclerosis. Aortic Atherosclerosis (ICD10-I70.0). Electronically Signed   By: Marijo Sanes M.D.   On: 11/28/2019 08:58   CT Abdomen Pelvis W Contrast  Result Date: 11/28/2019 CLINICAL DATA:  Restaging colorectal cancer. EXAM: CT CHEST, ABDOMEN, AND PELVIS WITH CONTRAST TECHNIQUE: Multidetector CT imaging of the chest, abdomen and pelvis was performed following the standard protocol during bolus administration of intravenous contrast. CONTRAST:  154mL OMNIPAQUE IOHEXOL 300 MG/ML  SOLN COMPARISON:  07/24/2019 FINDINGS: CT CHEST FINDINGS Cardiovascular: The heart is normal in size. No pericardial effusion. The aorta is  normal in caliber. No dissection. Stable atherosclerotic calcifications. Stable three-vessel coronary artery calcifications. Mediastinum/Nodes: No mediastinal or hilar mass or adenopathy. The esophagus is grossly normal. Lungs/Pleura: Stable small right pleural effusion with overlying chronic atelectasis. Possible radiation changes. No pulmonary nodules to suggest pulmonary metastatic disease. No acute pulmonary findings. Musculoskeletal: No chest wall mass, supraclavicular or axillary adenopathy. No significant bony findings. Cervical fusion hardware is again noted. CT ABDOMEN PELVIS FINDINGS Hepatobiliary: Stable changes of cirrhosis involving the liver. No hepatic lesions or intrahepatic biliary dilatation. The gallbladder is surgically absent. No common bile duct dilatation. Pancreas: No mass, inflammation or ductal dilatation. Spleen: Normal size.  No focal lesions. Adrenals/Urinary Tract: Adrenal glands and kidneys are unremarkable stable. The bladder appears. Stomach/Bowel: The stomach, duodenum, small bowel colon unremarkable. No acute inflammatory changes, mass lesions obstructive findings. Vascular/Lymphatic: Stable advanced atherosclerotic calcifications involving the aorta, iliac arteries and branch vessels but no aneurysm or dissection. The major venous structures are patent. No mesenteric or retroperitoneal mass or adenopathy. Reproductive: The prostate gland seminal vesicles unremarkable. Other: No pelvic mass or adenopathy. No free pelvic fluid collections. No inguinal mass or adenopathy. No abdominal wall hernia or subcutaneous lesions. Musculoskeletal: No significant bony findings. IMPRESSION: 1. Stable small right pleural effusion with overlying chronic atelectasis. Possible radiation changes. 2. No findings for pulmonary metastatic disease. 3. Stable changes of cirrhosis involving the liver. No hepatic lesions. 4. Stable advanced atherosclerotic calcifications involving the thoracic and abdominal  aorta and branch vessels including the coronary arteries. 5. Aortic atherosclerosis. Aortic Atherosclerosis (ICD10-I70.0). Electronically Signed   By: Marijo Sanes M.D.   On: 11/28/2019 08:58     ASSESSMENT:  1.  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 -We have  discontinued Stivarga because of unhealed wounds. -His wounds in the groin and in the back has healed up nicely. -CT CAP on 11/27/2019 showed stable small right pleural effusion with chronic atelectasis.  No findings of pulmonary metastatic disease.  Stable changes of cirrhosis with no liver lesions.  2.  Chronic ulcers: -He had developed ulcers in the right upper thigh which completely healed since we stopped Stivarga.   PLAN:  1.  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  We reviewed labs from 11/27/2019 which shows normal LFTs.  CEA is 3.7. -I discussed results of CT scan which did not show any evidence of metastatic disease. -Follow-up in 6 months with repeat scan and labs.    Orders placed this encounter:  Orders Placed This Encounter  Procedures  . CT CHEST ABDOMEN PELVIS W CONTRAST  . CBC with Differential/Platelet  . Comprehensive metabolic panel  . CEA     Derek Jack, MD [place] [phone]   I, Milinda Antis, am acting as a scribe for Dr. Sanda Linger.  I, Derek Jack MD, have reviewed the above documentation for accuracy and completeness, and I agree with the above.

## 2019-12-04 NOTE — Patient Instructions (Addendum)
[place] at [facility] Discharge Instructions  You were seen today by Dr. Delton Coombes. He went over your recent results and scans. You will be scheduled for a CT scan of your chest and abdomen before your next visit. Your next appointment will be with the nurse practitioner in 6 months for labs and follow up.   Thank you for choosing [place] at [facility] to provide your oncology and hematology care.  To afford each patient quality time with our provider, please arrive at least 15 minutes before your scheduled appointment time.   If you have a lab appointment with the [facility] please come in thru the Main Entrance and check in at the main information desk  You need to re-schedule your appointment should you arrive 10 or more minutes late.  We strive to give you quality time with our providers, and arriving late affects you and other patients whose appointments are after yours.  Also, if you no show three or more times for appointments you may be dismissed from the clinic at the providers discretion.     Again, thank you for choosing [facility].  Our hope is that these requests will decrease the amount of time that you wait before being seen by our physicians.       _____________________________________________________________  Should you have questions after your visit to [facility], please contact our office at [phone] between the hours of 8:00 a.m. and 4:30 p.m.  Voicemails left after 4:00 p.m. will not be returned until the following business day.  For prescription refill requests, have your pharmacy contact our office and allow 72 hours.    Cancer Center Support Programs:   > Cancer Support Group  2nd Tuesday of the month 1pm-2pm, Journey Room

## 2020-01-26 ENCOUNTER — Other Ambulatory Visit: Payer: Self-pay

## 2020-01-26 ENCOUNTER — Inpatient Hospital Stay (HOSPITAL_COMMUNITY): Payer: Medicare Other | Attending: Hematology

## 2020-01-26 DIAGNOSIS — Z452 Encounter for adjustment and management of vascular access device: Secondary | ICD-10-CM | POA: Diagnosis not present

## 2020-01-26 DIAGNOSIS — C189 Malignant neoplasm of colon, unspecified: Secondary | ICD-10-CM | POA: Insufficient documentation

## 2020-01-26 DIAGNOSIS — C7801 Secondary malignant neoplasm of right lung: Secondary | ICD-10-CM | POA: Insufficient documentation

## 2020-01-26 MED ORDER — SODIUM CHLORIDE 0.9% FLUSH
10.0000 mL | Freq: Once | INTRAVENOUS | Status: AC
  Administered 2020-01-26: 10 mL via INTRAVENOUS

## 2020-01-26 MED ORDER — HEPARIN SOD (PORK) LOCK FLUSH 100 UNIT/ML IV SOLN
500.0000 [IU] | Freq: Once | INTRAVENOUS | Status: AC
  Administered 2020-01-26: 500 [IU] via INTRAVENOUS

## 2020-01-26 NOTE — Progress Notes (Signed)
Patient presents today for [employer].  Flushed with 10 ml of normal saline.  No blood return noted.  Patient could tasted the saline.  No pain or swelling.  Patient stated that his PORT doesn't give blood.  PORT then flushed with 500 Units of heparin.  Patient tolerated well.  Patient discharged via wheelchair, alert, oriented, and in stable condition.

## 2020-04-26 ENCOUNTER — Inpatient Hospital Stay (HOSPITAL_COMMUNITY): Payer: Medicare Other | Attending: Hematology

## 2020-04-26 ENCOUNTER — Other Ambulatory Visit: Payer: Self-pay

## 2020-04-26 ENCOUNTER — Encounter (HOSPITAL_COMMUNITY): Payer: Self-pay

## 2020-04-26 VITALS — BP 162/73 | HR 83 | Temp 96.9°F | Resp 20 | Wt 250.4 lb

## 2020-04-26 DIAGNOSIS — D509 Iron deficiency anemia, unspecified: Secondary | ICD-10-CM | POA: Insufficient documentation

## 2020-04-26 DIAGNOSIS — C189 Malignant neoplasm of colon, unspecified: Secondary | ICD-10-CM | POA: Insufficient documentation

## 2020-04-26 DIAGNOSIS — Z452 Encounter for adjustment and management of vascular access device: Secondary | ICD-10-CM | POA: Insufficient documentation

## 2020-04-26 DIAGNOSIS — K7460 Unspecified cirrhosis of liver: Secondary | ICD-10-CM | POA: Diagnosis present

## 2020-04-26 DIAGNOSIS — E611 Iron deficiency: Secondary | ICD-10-CM

## 2020-04-26 MED ORDER — SODIUM CHLORIDE 0.9% FLUSH
10.0000 mL | Freq: Once | INTRAVENOUS | Status: AC
  Administered 2020-04-26: 10 mL via INTRAVENOUS

## 2020-04-26 MED ORDER — HEPARIN SOD (PORK) LOCK FLUSH 100 UNIT/ML IV SOLN
500.0000 [IU] | Freq: Once | INTRAVENOUS | Status: AC
  Administered 2020-04-26: 500 [IU] via INTRAVENOUS

## 2020-04-26 NOTE — Progress Notes (Signed)
Patients port flushed without difficulty.  No blood return noted (per pt, port has not given blood in years and there was no pain with flushing).  No bruising or swelling noted at site.  Band aid applied.  VSS with discharge and left in a wheelchair with no s/s of distress noted.

## 2020-06-08 DIAGNOSIS — L03114 Cellulitis of left upper limb: Secondary | ICD-10-CM | POA: Insufficient documentation

## 2020-06-14 ENCOUNTER — Inpatient Hospital Stay (HOSPITAL_COMMUNITY): Payer: Medicare Other

## 2020-06-14 ENCOUNTER — Ambulatory Visit (HOSPITAL_COMMUNITY): Payer: Medicare Other

## 2020-06-14 ENCOUNTER — Other Ambulatory Visit (HOSPITAL_COMMUNITY): Payer: Medicare Other

## 2020-06-17 ENCOUNTER — Ambulatory Visit (HOSPITAL_COMMUNITY): Payer: Medicare Other | Admitting: Hematology

## 2020-06-17 ENCOUNTER — Encounter (HOSPITAL_COMMUNITY): Payer: Self-pay

## 2020-06-30 ENCOUNTER — Other Ambulatory Visit (HOSPITAL_COMMUNITY): Payer: Medicare Other

## 2020-06-30 ENCOUNTER — Ambulatory Visit (HOSPITAL_COMMUNITY): Payer: Medicare Other

## 2020-07-07 ENCOUNTER — Ambulatory Visit (HOSPITAL_COMMUNITY): Payer: Medicare Other | Admitting: Hematology

## 2020-07-07 ENCOUNTER — Encounter (HOSPITAL_COMMUNITY): Payer: Self-pay

## 2020-08-02 ENCOUNTER — Inpatient Hospital Stay (HOSPITAL_COMMUNITY): Payer: Medicare Other | Attending: Hematology

## 2020-08-02 ENCOUNTER — Other Ambulatory Visit: Payer: Self-pay

## 2020-08-02 ENCOUNTER — Ambulatory Visit (HOSPITAL_COMMUNITY)
Admission: RE | Admit: 2020-08-02 | Discharge: 2020-08-02 | Disposition: A | Discharge status: Home / Self Care | Payer: Medicare Other | Source: Ambulatory Visit | Attending: Hematology | Admitting: Hematology

## 2020-08-02 DIAGNOSIS — C7951 Secondary malignant neoplasm of bone: Secondary | ICD-10-CM | POA: Insufficient documentation

## 2020-08-02 DIAGNOSIS — C189 Malignant neoplasm of colon, unspecified: Secondary | ICD-10-CM

## 2020-08-02 DIAGNOSIS — I2510 Atherosclerotic heart disease of native coronary artery without angina pectoris: Secondary | ICD-10-CM | POA: Insufficient documentation

## 2020-08-02 DIAGNOSIS — M7062 Trochanteric bursitis, left hip: Secondary | ICD-10-CM | POA: Insufficient documentation

## 2020-08-02 DIAGNOSIS — K7460 Unspecified cirrhosis of liver: Secondary | ICD-10-CM | POA: Diagnosis not present

## 2020-08-02 DIAGNOSIS — J439 Emphysema, unspecified: Secondary | ICD-10-CM | POA: Diagnosis not present

## 2020-08-02 DIAGNOSIS — I700 Atherosclerosis of aorta: Secondary | ICD-10-CM | POA: Insufficient documentation

## 2020-08-02 DIAGNOSIS — M6208 Separation of muscle (nontraumatic), other site: Secondary | ICD-10-CM | POA: Insufficient documentation

## 2020-08-02 DIAGNOSIS — Z79899 Other long term (current) drug therapy: Secondary | ICD-10-CM | POA: Insufficient documentation

## 2020-08-02 DIAGNOSIS — J90 Pleural effusion, not elsewhere classified: Secondary | ICD-10-CM | POA: Diagnosis not present

## 2020-08-02 LAB — COMPREHENSIVE METABOLIC PANEL
ALT: 19 U/L (ref 0–44)
AST: 23 U/L (ref 15–41)
Albumin: 3.2 g/dL — ABNORMAL LOW (ref 3.5–5.0)
Alkaline Phosphatase: 76 U/L (ref 38–126)
Anion gap: 6 (ref 5–15)
BUN: 34 mg/dL — ABNORMAL HIGH (ref 8–23)
CO2: 21 mmol/L — ABNORMAL LOW (ref 22–32)
Calcium: 8.7 mg/dL — ABNORMAL LOW (ref 8.9–10.3)
Chloride: 107 mmol/L (ref 98–111)
Creatinine, Ser: 1.16 mg/dL (ref 0.61–1.24)
GFR, Estimated: >60 mL/min (ref >60)
Glucose, Bld: 116 mg/dL — ABNORMAL HIGH (ref 70–99)
Potassium: 5.6 mmol/L — ABNORMAL HIGH (ref 3.5–5.1)
Sodium: 134 mmol/L — ABNORMAL LOW (ref 135–145)
Total Bilirubin: 0.7 mg/dL (ref 0.3–1.2)
Total Protein: 7 g/dL (ref 6.5–8.1)

## 2020-08-02 LAB — CBC WITH DIFFERENTIAL/PLATELET
Abs Immature Granulocytes: 0.02 10*3/uL (ref 0.00–0.07)
Basophils Absolute: 0.1 10*3/uL (ref 0.0–0.1)
Basophils Relative: 1 %
Eosinophils Absolute: 0.6 10*3/uL — ABNORMAL HIGH (ref 0.0–0.5)
Eosinophils Relative: 7 %
HCT: 36.2 % — ABNORMAL LOW (ref 39.0–52.0)
Hemoglobin: 11.3 g/dL — ABNORMAL LOW (ref 13.0–17.0)
Immature Granulocytes: 0 %
Lymphocytes Relative: 15 %
Lymphs Abs: 1.3 10*3/uL (ref 0.7–4.0)
MCH: 30.9 pg (ref 26.0–34.0)
MCHC: 31.2 g/dL (ref 30.0–36.0)
MCV: 98.9 fL (ref 80.0–100.0)
Monocytes Absolute: 0.8 10*3/uL (ref 0.1–1.0)
Monocytes Relative: 10 %
Neutro Abs: 5.6 10*3/uL (ref 1.7–7.7)
Neutrophils Relative %: 67 %
Platelets: 240 10*3/uL (ref 150–400)
RBC: 3.66 MIL/uL — ABNORMAL LOW (ref 4.22–5.81)
RDW: 14 % (ref 11.5–15.5)
WBC: 8.3 10*3/uL (ref 4.0–10.5)
nRBC: 0 % (ref 0.0–0.2)

## 2020-08-02 MED ORDER — IOHEXOL 300 MG/ML  SOLN
100.0000 mL | Freq: Once | INTRAMUSCULAR | Status: AC | PRN
  Administered 2020-08-02: 100 mL via INTRAVENOUS

## 2020-08-03 LAB — CEA: CEA: 7.5 ng/mL — ABNORMAL HIGH (ref 0.0–4.7)

## 2020-08-16 ENCOUNTER — Ambulatory Visit (HOSPITAL_COMMUNITY): Payer: Medicare Other | Admitting: Hematology

## 2020-08-18 ENCOUNTER — Inpatient Hospital Stay (HOSPITAL_COMMUNITY): Payer: Medicare Other | Attending: Hematology | Admitting: Hematology and Oncology

## 2020-08-18 ENCOUNTER — Other Ambulatory Visit: Payer: Self-pay

## 2020-08-18 ENCOUNTER — Encounter (HOSPITAL_COMMUNITY): Payer: Self-pay | Admitting: Hematology and Oncology

## 2020-08-18 VITALS — BP 132/57 | HR 83 | Temp 96.9°F | Resp 17

## 2020-08-18 DIAGNOSIS — M6208 Separation of muscle (nontraumatic), other site: Secondary | ICD-10-CM | POA: Diagnosis not present

## 2020-08-18 DIAGNOSIS — M4726 Other spondylosis with radiculopathy, lumbar region: Secondary | ICD-10-CM | POA: Diagnosis not present

## 2020-08-18 DIAGNOSIS — I129 Hypertensive chronic kidney disease with stage 1 through stage 4 chronic kidney disease, or unspecified chronic kidney disease: Secondary | ICD-10-CM | POA: Diagnosis not present

## 2020-08-18 DIAGNOSIS — M5116 Intervertebral disc disorders with radiculopathy, lumbar region: Secondary | ICD-10-CM | POA: Insufficient documentation

## 2020-08-18 DIAGNOSIS — M7062 Trochanteric bursitis, left hip: Secondary | ICD-10-CM | POA: Insufficient documentation

## 2020-08-18 DIAGNOSIS — E1122 Type 2 diabetes mellitus with diabetic chronic kidney disease: Secondary | ICD-10-CM | POA: Diagnosis not present

## 2020-08-18 DIAGNOSIS — N183 Chronic kidney disease, stage 3 unspecified: Secondary | ICD-10-CM

## 2020-08-18 DIAGNOSIS — Z87891 Personal history of nicotine dependence: Secondary | ICD-10-CM | POA: Diagnosis not present

## 2020-08-18 DIAGNOSIS — Z9049 Acquired absence of other specified parts of digestive tract: Secondary | ICD-10-CM | POA: Diagnosis not present

## 2020-08-18 DIAGNOSIS — C7801 Secondary malignant neoplasm of right lung: Secondary | ICD-10-CM | POA: Diagnosis not present

## 2020-08-18 DIAGNOSIS — C189 Malignant neoplasm of colon, unspecified: Secondary | ICD-10-CM | POA: Diagnosis not present

## 2020-08-18 DIAGNOSIS — J439 Emphysema, unspecified: Secondary | ICD-10-CM | POA: Diagnosis not present

## 2020-08-18 DIAGNOSIS — Z86718 Personal history of other venous thrombosis and embolism: Secondary | ICD-10-CM | POA: Diagnosis not present

## 2020-08-18 DIAGNOSIS — C19 Malignant neoplasm of rectosigmoid junction: Secondary | ICD-10-CM | POA: Diagnosis present

## 2020-08-18 DIAGNOSIS — Z8249 Family history of ischemic heart disease and other diseases of the circulatory system: Secondary | ICD-10-CM | POA: Diagnosis not present

## 2020-08-18 DIAGNOSIS — I700 Atherosclerosis of aorta: Secondary | ICD-10-CM | POA: Insufficient documentation

## 2020-08-18 DIAGNOSIS — Z818 Family history of other mental and behavioral disorders: Secondary | ICD-10-CM | POA: Diagnosis not present

## 2020-08-18 DIAGNOSIS — K7460 Unspecified cirrhosis of liver: Secondary | ICD-10-CM | POA: Insufficient documentation

## 2020-08-18 DIAGNOSIS — E875 Hyperkalemia: Secondary | ICD-10-CM | POA: Diagnosis not present

## 2020-08-18 DIAGNOSIS — J90 Pleural effusion, not elsewhere classified: Secondary | ICD-10-CM | POA: Diagnosis not present

## 2020-08-18 DIAGNOSIS — Z79899 Other long term (current) drug therapy: Secondary | ICD-10-CM | POA: Insufficient documentation

## 2020-08-18 NOTE — Assessment & Plan Note (Signed)
He has severe hyperkalemia likely due to poor oral fluid intake and lisinopril I recommend recheck CMP and I encouraged the patient to increase oral fluid intake

## 2020-08-18 NOTE — Progress Notes (Signed)
[place] FOLLOW-UP progress notes  Patient Care Team: [facility] as PCP - General (Internal Medicine) Burke Keels, MD as Surgeon (Surgery)  CHIEF COMPLAINTS/PURPOSE OF VISIT:  Stage IV metastatic colon cancer, for further evaluation  HISTORY OF PRESENTING ILLNESS:  Gary Nelson 80 y.o. male is seen today because his primary oncologist is not available The patient is not on any chemotherapy right now He has chronic cough He had recurrent hospitalization for cellulitis He denies recent infection, fever or chills He denies worsening back pain He denies recent falls  I reviewed the patient's records extensive and collaborated the history with the patient. Summary of his history is as follows: Oncology History  Adenocarcinoma of colon ([place])  10/04/2012 Definitive Surgery   Partial collectomy by Dr. [name]    10/04/2012 Pathology Results   4.5 cm poorly differentiated adenocarcinoma, negative margins, 0/7 lymph nodes for metastatic disease.  Oncotype recurrence score of 26 (high)     Chemotherapy   FOLFOX     Adverse Reaction   Progressive peripheral neuropathy     Chemotherapy   Infusional 5 FU.  Completed 6 months worth of adjuvant therapy.    02/23/2014 Procedure   Colonoscopy by Dr. Anthony Sar.    07/01/2015 PET scan   There is malignant range uptake with RLL pulmonary nodule suspicious for metastatic disease or primary pulm neoplasm. Nonspecific focus of uptake in the L femoral neck. No corresponding CT abnormality. Cannot rule out metastasis.    07/06/2015 Imaging   MR C-spine- C5-6 there is a broad-based disc bulge w R paracentral disc protrusion deforming the spinal cord. Moderate R foraminal stenosis. C6-7 there is a central disc protrusion slightly eccentric towards the R and mildly deforming the ventral c-spine     09/16/2015 Procedure   RLL needle biopsy by IR.    09/17/2015 Pathology Results   Metastatic adenocarcinoma  consistent with a primary colorectal adenocarcinoma.    10/12/2015 - 10/22/2015 Radiation Therapy   SBRT by Dr. [name] to RLL pulmonary lesion (biopsy proven to be oligometastasis).    12/09/2015 Imaging   CT chest- 1. Slight interval decrease in size of the right lower lobe pulmonary nodule. Some surrounding radiation changes. 2. No mediastinal or hilar mass or adenopathy. 3. No new pulmonary nodules. 4. Stable underline emphysematous changes. 5. Cirrhotic changes involving the liver but no upper abdominal metastatic disease.   03/14/2016 Imaging   CT chest- 1. Continued mild reduction in the size of the treated right lobe pulmonary nodule. 2. Increased patchy consolidation, reticulation and ground-glass attenuation in the basilar right lower lobe, nonspecific, favor evolving postradiation change, recommend attention on follow-up chest CT. 3. No evidence of new or progressive metastatic disease in the chest. 4. Aortic atherosclerosis. Left main and 3 vessel coronary atherosclerosis. 5. Mild emphysema.    06/30/2016 Imaging   CT CAP- 1. The appearance of the treated right lower lobe nodule is essentially unchanged, and there are evolving postradiation changes in the base of the right lower lobe, as above. No definite signs of new metastatic disease are noted elsewhere in the chest, abdomen or pelvis. 2. Aortic atherosclerosis, in addition to left main and 3 vessel coronary artery disease. Assessment for potential risk factor modification, dietary therapy or pharmacologic therapy may be warranted, if clinically indicated. 3. Morphologic changes in the liver suggestive of cirrhosis, as above. 4. Additional incidental findings, similar prior studies, as above.    11/17/2016 Imaging   CT CAP Study Result  CLINICAL DATA:  Stage IV colon cancer originally diagnosed in August 2014 status post partial colectomy, with right lower lobe lung metastasis diagnosed August 2017 status  post SBRT completed 10/22/2015. Restaging.   EXAM: CT CHEST, ABDOMEN, AND PELVIS WITH CONTRAST   TECHNIQUE: Multidetector CT imaging of the chest, abdomen and pelvis was performed following the standard protocol during bolus administration of intravenous contrast.   CONTRAST:  148mL ISOVUE-300 IOPAMIDOL (ISOVUE-300) INJECTION 61%   COMPARISON:  06/30/2016 CT chest, abdomen and pelvis.   FINDINGS: CT CHEST FINDINGS   Cardiovascular: Normal heart size. No significant pericardial fluid/thickening. Left main, left anterior descending, left circumflex and right coronary atherosclerosis. Atherosclerotic nonaneurysmal thoracic aorta. Normal caliber pulmonary arteries. No central pulmonary emboli. Right internal jugular MediPort terminates at the junction of the right and left brachiocephalic veins.   Mediastinum/Nodes: No discrete thyroid nodules. Unremarkable esophagus. No pathologically enlarged axillary, mediastinal or hilar lymph nodes.   Lungs/Pleura: No pneumothorax. No pleural effusion. There is a nodular 2.7 x 1.6 cm focus of consolidation in the medial basilar right lower lobe (series 3/ image 108), previously 1.6 x 1.1 cm. No appreciable change in patchy subpleural reticulation in both lungs without significant traction bronchiectasis or frank honeycombing. Mild centrilobular and paraseptal emphysema with mild diffuse bronchial wall thickening. No acute consolidative airspace disease or new significant pulmonary nodules.   Musculoskeletal: No aggressive appearing focal osseous lesions. Moderate thoracic spondylosis. Stable chronic left scapular deformity.   CT ABDOMEN PELVIS FINDINGS   Hepatobiliary: Normal liver with no liver mass. Cholecystectomy. No biliary ductal dilatation.   Pancreas: Normal, with no mass or duct dilation.   Spleen: Normal size. No mass.   Adrenals/Urinary Tract: Normal adrenals. No hydronephrosis. No renal masses. Normal bladder.    Stomach/Bowel: Grossly normal stomach. Normal caliber small bowel with no small bowel wall thickening. Stable appearance of the appendix, within normal limits. Stable postsurgical changes from partial distal colectomy with intact appearing distal colonic anastomosis. Oral contrast transits to the cecum. No large bowel wall thickening or new pericholecystic fat stranding .   Vascular/Lymphatic: Atherosclerotic nonaneurysmal abdominal aorta. Patent portal, splenic, hepatic and renal veins. No pathologically enlarged lymph nodes in the abdomen or pelvis.   Reproductive:  Normal size prostate.   Other: No pneumoperitoneum, ascites or focal fluid collection. Stable subcutaneous calcified granulomas throughout the left lower back and gluteal regions.   Musculoskeletal: No aggressive appearing focal osseous lesions. Marked lumbar spondylosis.   IMPRESSION: 1. Nodular focus of consolidation in the medial basilar right lower lobe is increased in size. While this may represent evolving masslike radiation fibrosis, recurrence of the lung metastasis is not excluded. PET-CT would be useful for further evaluation. Otherwise, continued close chest CT surveillance is advised . 2. No additional potential new sites of metastatic disease in the chest, abdomen or pelvis. 3. No evidence of local tumor recurrence at the distal colonic anastomosis. 4. Left main and 3 vessel coronary atherosclerosis . 5. Aortic Atherosclerosis (ICD10-I70.0) and Emphysema (ICD10-J43.9).      11/30/2016 Relapse/Recurrence   PET: IMPRESSION: 1. Enlarging right lower lobe pulmonary nodule and slight increase in the SUV max suggesting residual neoplasm. No new pulmonary lesions. 2. No findings for local recurrence or abdominal/pelvic metastatic disease.    01/02/2017 - 01/12/2017 Radiation Therapy    The patient saw Dr. [name] for SBRT radiation treatment. The tumor in the right lung was treated with a course of  stereotactic body radiation treatment. The patient received 50 Gy In 5 fractions at  10 Gy per fraction.      10/05/2017 - 10/05/2017 Chemotherapy   The patient had bevacizumab (AVASTIN) 900 mg in sodium chloride 0.9 % 100 mL chemo infusion, 7.3 mg/kg = 925 mg, Intravenous,  Once, 1 of 3 cycles Administration: 900 mg (10/05/2017)   for chemotherapy treatment.       MEDICAL HISTORY:  Past Medical History:  Diagnosis Date   Adenocarcinoma of colon ([place])    RLL -  Pulmonary nodule (06/2015, concerning for mets vs primary; to see RAD-ONC Dr. 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   Cystitis    Diabetes mellitus without complication ([place])    Type II   DVT (deep venous thrombosis) ([place]) 2014   post op   Hematuria    Hypertension    Iron deficiency 07/31/2019   Neuropathy    Portal hypertension ([place]) 07/28/2019   Prostatitis    Pulmonary nodule    Sleep apnea     SURGICAL HISTORY: Past Surgical History:  Procedure Laterality Date   BACK SURGERY     CATARACT EXTRACTION     left   CATARACT EXTRACTION W/PHACO Right 04/11/2012   Procedure: CATARACT EXTRACTION PHACO AND INTRAOCULAR LENS PLACEMENT ([place]);  Surgeon: Tonny Branch, MD;  Location: [facility];  Service: Ophthalmology;  Laterality: Right;  CDE:62.48   CERVICAL FUSION     c4-c5   CHOLECYSTECTOMY     COLON SURGERY  09/29/12   Colon resection for cancer   COLONOSCOPY     EYE SURGERY     cataract   GIVENS CAPSULE STUDY N/A 03/15/2015   Procedure: GIVENS CAPSULE STUDY;  Surgeon: Rogene Houston, MD;  Location: [facility];  Service: Endoscopy;  Laterality: N/A;  730   LEG SURGERY     ORIF FEMUR FRACTURE     right   POSTERIOR CERVICAL FUSION/FORAMINOTOMY N/A 07/27/2015   Procedure: Cervical four to Cervical seven Laminectomy with Cervical four to Thoracic one Dorsal internal fixation and fusion;  Surgeon: Kevan Ny Ditty, MD;  Location:  [facility];  Service: Neurosurgery;  Laterality: N/A;  C4 to C7 Laminectomy with C4 to T1 Dorsal internal fixation and fusion    SOCIAL HISTORY: Social History   Socioeconomic History   Marital status: Single    Spouse name: Not on file   Number of children: Not on file   Years of education: Not on file   Highest education level: Not on file  Occupational History   Not on file  Tobacco Use   Smoking status: Former    Packs/day: 1.00    Years: 55.00    Pack years: 55.00    Types: Cigarettes    Start date: 02/07/1955   Smokeless tobacco: Never   Tobacco comments:    quit 2015  Substance and Sexual Activity   Alcohol use: Not Currently    Alcohol/week: 3.0 standard drinks    Types: 3 Cans of beer per week   Drug use: No   Sexual activity: Yes    Birth control/protection: None  Other Topics Concern   Not on file  Social History Narrative   Not on file   Social Determinants of Health   Financial Resource Strain: Not on file  Food Insecurity: Not on file  Transportation Needs: Not on file  Physical Activity: Not on file  Stress: Not on file  Social Connections: Not  on file  Intimate Partner Violence: Not on file    FAMILY HISTORY: Family History  Problem Relation Age of Onset   Heart disease Father    Heart attack Father    Alzheimer's disease Mother     ALLERGIES:  has No Known Allergies.  MEDICATIONS:  Current Outpatient Medications  Medication Sig Dispense Refill   aspirin EC 81 MG tablet Take 81 mg by mouth daily.     [employer] B-12 5000 MCG SUBL Place 1 tablet (5,000 mcg total) under the tongue daily. 30 tablet 6   furosemide (LASIX) 20 MG tablet Take 20 mg by mouth every other day.      gabapentin (NEURONTIN) 300 MG capsule Take 2 capsules (600 mg total) by mouth 3 (three) times daily. (Patient taking differently: Take 600 mg by mouth 2 (two) times daily.) 180 capsule 2   insulin NPH (HUMULIN N,NOVOLIN N) 100 UNIT/ML injection Inject 15-30 Units into the skin  See admin instructions. Per pt, 5 units every morning and 5 units in the evening     ketoconazole (NIZORAL) 2 % cream Apply topically as needed.     lisinopril (PRINIVIL,ZESTRIL) 40 MG tablet Take 40 mg by mouth daily.     naproxen (NAPROSYN) 500 MG tablet TAKE 1 TABLET BY MOUTH TWICE A DAY WITH MEALS     OXYGEN Inhale 2 L into the lungs continuous. At night time only     tamsulosin (FLOMAX) 0.4 MG CAPS capsule Take 1 capsule by mouth daily.     atorvastatin (LIPITOR) 10 MG tablet Take 10 mg by mouth daily at 6 PM.      phenylephrine-shark liver oil-mineral oil-petrolatum (PREPARATION H) 0.25-14-74.9 % rectal ointment Place 1 application rectally as needed for hemorrhoids. (Patient not taking: Reported on 08/18/2020)     silver sulfADIAZINE (SILVADENE) 1 % cream Apply 1 application topically as needed.  (Patient not taking: Reported on 08/18/2020)     No current facility-administered medications for this visit.    REVIEW OF SYSTEMS:   Constitutional: Denies fevers, chills or abnormal night sweats Eyes: Denies blurriness of vision, double vision or watery eyes Ears, nose, mouth, throat, and face: Denies mucositis or sore throat Cardiovascular: Denies palpitation, chest discomfort or lower extremity swelling Gastrointestinal:  Denies nausea, heartburn or change in bowel habits Skin: Denies abnormal skin rashes Lymphatics: Denies new lymphadenopathy or easy bruising Neurological:Denies numbness, tingling or new weaknesses Behavioral/Psych: Mood is stable, no new changes  All other systems were reviewed with the patient and are negative.  PHYSICAL EXAMINATION: ECOG PERFORMANCE STATUS: 2 - Symptomatic, <50% confined to bed  Vitals:   08/18/20 1506  BP: (!) 132/57  Pulse: 83  Resp: 17  Temp: (!) 96.9 F (36.1 C)  SpO2: 96%   There were no vitals filed for this visit.  GENERAL:alert, no distress and comfortable.  Limited examination due to his body habitus SKIN: Noted chronic venous  stasis changes EYES: normal, conjunctiva are pink and non-injected, sclera clear OROPHARYNX:no exudate, normal lips, buccal mucosa, and tongue  NECK: supple, thyroid normal size, non-tender, without nodularity LYMPH:  no palpable lymphadenopathy in the cervical, axillary or inguinal LUNGS: clear to auscultation and percussion with normal breathing effort HEART: regular rate & rhythm and no murmurs without lower extremity edema ABDOMEN:abdomen soft, non-tender and normal bowel sounds Musculoskeletal:no cyanosis of digits and no clubbing  PSYCH: alert & oriented x 3 with fluent speech NEURO: no focal motor/sensory deficits  LABORATORY DATA:  I have reviewed the data as  listed Lab Results  Component Value Date   WBC 8.3 08/02/2020   HGB 11.3 (L) 08/02/2020   HCT 36.2 (L) 08/02/2020   MCV 98.9 08/02/2020   PLT 240 08/02/2020   Recent Labs    11/27/19 1034 08/02/20 1248  NA 139 134*  K 4.9 5.6*  CL 104 107  CO2 26 21*  GLUCOSE 93 116*  BUN 27* 34*  CREATININE 0.97 1.16  CALCIUM 9.0 8.7*  GFRNONAA >60 >60  PROT 6.4* 7.0  ALBUMIN 3.4* 3.2*  AST 21 23  ALT 17 19  ALKPHOS 57 76  BILITOT 0.8 0.7    RADIOGRAPHIC STUDIES: I have personally reviewed the radiological images as listed and agreed with the findings in the report. CT CHEST ABDOMEN PELVIS W CONTRAST  Result Date: 08/03/2020 CLINICAL DATA:  Surveillance of metastatic colorectal cancer. EXAM: CT CHEST, ABDOMEN, AND PELVIS WITH CONTRAST TECHNIQUE: Multidetector CT imaging of the chest, abdomen and pelvis was performed following the standard protocol during bolus administration of intravenous contrast. CONTRAST:  148mL OMNIPAQUE IOHEXOL 300 MG/ML  SOLN COMPARISON:  Multiple exams, including 11/27/2019 FINDINGS: CT CHEST FINDINGS Cardiovascular: Right Port-A-Cath tip: Brachiocephalic confluence. Coronary, aortic arch, and branch vessel atherosclerotic vascular disease. Mediastinum/Nodes: Small mediastinal lymph nodes are not  pathologically enlarged. Lungs/Pleura: Interstitial accentuation peripherally in the lungs potentially from mild fibrosis with some peripheral paraseptal emphysema anteriorly in both upper lobes. There is some persistent chronic scarring or volume loss posteromedially in the right lower lobe, but with increased anterior lobularity (for example on image 98 series 7) and slightly increased convexity extending upward towards the right hilum (for example on image 110 of series 7) compared to the 03/21/2019 exam. This could be from peripheral rounded atelectasis or tumor. Small persistent right pleural effusion. Musculoskeletal: Multilevel cervical fusion also with involvement of the wedged anterior T1 vertebra, not appreciably changed from prior. Chronic mild bony heterogeneity in the thoracic spine. New vertical sclerotic banding eccentric to the right in the T9 vertebral body. Inferior endplate irregularity at T10 increased from prior. Old bilateral rib and left scapular deformities from healed fractures. Old deformity of the right proximal femoral shaft. Chronic mild irregularity of the right iliac bone anteriorly. CT ABDOMEN PELVIS FINDINGS Hepatobiliary: Cholecystectomy. No well-defined hepatic mass. Morphologic findings in the liver suggest cirrhosis. Pancreas: Unremarkable Spleen: Unremarkable Adrenals/Urinary Tract: Gas in the urinary bladder, query recent catheterization. Adrenal glands unremarkable. 1.1 by 1.5 cm hypodense lesion in the left mid upper kidney medially on image 17 of series 8 is technically too small to characterize although statistically likely to be a benign cyst. Stomach/Bowel: Unremarkable Vascular/Lymphatic: Aortoiliac atherosclerotic vascular disease. No pathologic adenopathy. Reproductive: Diminutive prostate gland. Other: Small chronic right omental nodule 0.7 by 0.5 cm on image 74 series 2. Probably benign given long-term chronicity. Musculoskeletal: Rectus diastasis. Chronic appearing  left trochanteric bursitis with internal heterotopic ossifications. Atrophic gluteal musculature in the pelvis. Mild dextroconvex lumbar scoliosis with spondylosis and degenerative disc disease causing multilevel impingement. Congenitally short pedicles in the lumbar spine. Chronic defect in the left lateral abdominal wall musculature in the perirenal space with potential associated lumbar hernia, image 78 series 2. Tract from prior IM rod in the right proximal femur, with chronic thickening of the right hip joint capsule. IMPRESSION: 1. New vertical sclerosis eccentric to the right in the T9 vertebral body and new inferior endplate irregularity at T10. Although possibly related to acute Schmorl's node and/or low-grade thoracic compression, skeletal metastatic disease is not completely excluded. This could  be further assessed with thoracic spine MRI with and without contrast or PET-CT if clinically warranted. 2. Persistent chronic scarring/volume loss posteromedially in the right lower lobe but with increased anterior lobularity and increased lobularity extending towards the right hilum. Although potentially from rounded atelectasis, tumor in this vicinity is a possibility. Nuclear medicine PET-CT could be helpful in differentiation. 3. Other imaging findings of potential clinical significance: Aortic Atherosclerosis (ICD10-I70.0). Coronary atherosclerosis. Mild fibrosis and paraseptal emphysema. Emphysema (ICD10-J43.9). Stable persistent right pleural effusion (small). Old rib and left scapular and right femoral fractures. Cirrhosis. Gas in the urinary bladder, query recent catheterization. Chronic 6 mm right omental nodule is long-term stable and likely benign. Chronic left trochanteric bursitis with internal heterotopic ossifications. Rectus diastasis. Left lumbar hernia. Mild dextroconvex lumbar scoliosis. Multilevel lumbar impingement. Electronically Signed   By: Van Clines M.D.   On: 08/03/2020 10:50     ASSESSMENT & PLAN:  Adenocarcinoma of colon ([place]) His PET CT scan is not normal There are increasing changes in the lung region and new changes in his thoracic spine His tumor marker is elevated compared to baseline I recommend PET CT scan for further evaluation per radiology recommendation  Lung metastasis [facility]) He has chronic cough for many years According to radiologist, there are changes in the recent CT imaging compared to last year I recommend PET CT scan for further evaluation, especially with rising tumor marker and he is in agreement  Hyperkalemia He has severe hyperkalemia likely due to poor oral fluid intake and lisinopril I recommend recheck CMP and I encouraged the patient to increase oral fluid intake  CKD (chronic kidney disease) He has mild acute on chronic renal failure, likely secondary to dehydration I plan to repeat blood count again along with his CMP We discussed the importance of adequate fluid hydration   Orders Placed This Encounter  Procedures   NM PET Image Restage (PS) Skull Base to Thigh    Standing Status:   Future    Standing Expiration Date:   08/18/2021    Order Specific Question:   If indicated for the ordered procedure, I authorize the administration of a radiopharmaceutical per Radiology protocol    Answer:   Yes    Order Specific Question:   Preferred imaging location?    Answer:   [facility]    Order Specific Question:   Radiology Contrast Protocol - do NOT remove file path    Answer:   [email or web address]   Comprehensive metabolic panel    Standing Status:   Future    Standing Expiration Date:   08/18/2021   CBC with Differential/Platelet    Standing Status:   Future    Standing Expiration Date:   08/18/2021   CEA    Standing Status:   Future    Standing Expiration Date:   08/18/2021    All questions were answered. The patient knows to call the clinic with any problems, questions or  concerns. The total time spent in the appointment was 30 minutes encounter with patients including review of chart and various tests results, discussions about plan of care and coordination of care plan   Heath Lark, MD 08/18/2020 5:19 PM

## 2020-08-18 NOTE — Assessment & Plan Note (Signed)
His PET CT scan is not normal There are increasing changes in the lung region and new changes in his thoracic spine His tumor marker is elevated compared to baseline I recommend PET CT scan for further evaluation per radiology recommendation

## 2020-08-18 NOTE — Assessment & Plan Note (Signed)
He has chronic cough for many years According to radiologist, there are changes in the recent CT imaging compared to last year I recommend PET CT scan for further evaluation, especially with rising tumor marker and he is in agreement

## 2020-08-18 NOTE — Assessment & Plan Note (Signed)
He has mild acute on chronic renal failure, likely secondary to dehydration I plan to repeat blood count again along with his CMP We discussed the importance of adequate fluid hydration

## 2020-08-24 DIAGNOSIS — Z85038 Personal history of other malignant neoplasm of large intestine: Secondary | ICD-10-CM | POA: Insufficient documentation

## 2020-08-24 DIAGNOSIS — E119 Type 2 diabetes mellitus without complications: Secondary | ICD-10-CM | POA: Insufficient documentation

## 2020-08-24 DIAGNOSIS — Z794 Long term (current) use of insulin: Secondary | ICD-10-CM | POA: Insufficient documentation

## 2020-08-24 DIAGNOSIS — I10 Essential (primary) hypertension: Secondary | ICD-10-CM | POA: Insufficient documentation

## 2020-08-24 DIAGNOSIS — M160 Bilateral primary osteoarthritis of hip: Secondary | ICD-10-CM | POA: Insufficient documentation

## 2020-08-24 DIAGNOSIS — C189 Malignant neoplasm of colon, unspecified: Secondary | ICD-10-CM | POA: Insufficient documentation

## 2020-09-06 ENCOUNTER — Other Ambulatory Visit (HOSPITAL_COMMUNITY): Payer: Medicare Other

## 2020-09-07 ENCOUNTER — Ambulatory Visit (HOSPITAL_COMMUNITY): Payer: Medicare Other

## 2020-09-08 ENCOUNTER — Inpatient Hospital Stay: Payer: Medicare Other

## 2020-09-08 ENCOUNTER — Inpatient Hospital Stay: Payer: Medicare Other | Attending: Hematology and Oncology

## 2020-09-08 ENCOUNTER — Encounter (HOSPITAL_COMMUNITY)
Admission: RE | Admit: 2020-09-08 | Discharge: 2020-09-08 | Disposition: A | Discharge status: Home / Self Care | Payer: Medicare Other | Source: Ambulatory Visit | Attending: Hematology and Oncology | Admitting: Hematology and Oncology

## 2020-09-08 ENCOUNTER — Other Ambulatory Visit: Payer: Self-pay

## 2020-09-08 DIAGNOSIS — Z79899 Other long term (current) drug therapy: Secondary | ICD-10-CM | POA: Insufficient documentation

## 2020-09-08 DIAGNOSIS — R0602 Shortness of breath: Secondary | ICD-10-CM | POA: Diagnosis not present

## 2020-09-08 DIAGNOSIS — C7801 Secondary malignant neoplasm of right lung: Secondary | ICD-10-CM | POA: Insufficient documentation

## 2020-09-08 DIAGNOSIS — R059 Cough, unspecified: Secondary | ICD-10-CM | POA: Diagnosis not present

## 2020-09-08 DIAGNOSIS — C189 Malignant neoplasm of colon, unspecified: Secondary | ICD-10-CM

## 2020-09-08 DIAGNOSIS — C19 Malignant neoplasm of rectosigmoid junction: Secondary | ICD-10-CM | POA: Insufficient documentation

## 2020-09-08 DIAGNOSIS — R197 Diarrhea, unspecified: Secondary | ICD-10-CM | POA: Insufficient documentation

## 2020-09-08 DIAGNOSIS — Z818 Family history of other mental and behavioral disorders: Secondary | ICD-10-CM | POA: Insufficient documentation

## 2020-09-08 DIAGNOSIS — Z8249 Family history of ischemic heart disease and other diseases of the circulatory system: Secondary | ICD-10-CM | POA: Insufficient documentation

## 2020-09-08 DIAGNOSIS — R5383 Other fatigue: Secondary | ICD-10-CM | POA: Insufficient documentation

## 2020-09-08 DIAGNOSIS — Z87891 Personal history of nicotine dependence: Secondary | ICD-10-CM | POA: Insufficient documentation

## 2020-09-08 DIAGNOSIS — Z95828 Presence of other vascular implants and grafts: Secondary | ICD-10-CM

## 2020-09-08 LAB — COMPREHENSIVE METABOLIC PANEL
ALT: 23 U/L (ref 0–44)
AST: 24 U/L (ref 15–41)
Albumin: 3 g/dL — ABNORMAL LOW (ref 3.5–5.0)
Alkaline Phosphatase: 84 U/L (ref 38–126)
Anion gap: 8 (ref 5–15)
BUN: 36 mg/dL — ABNORMAL HIGH (ref 8–23)
CO2: 23 mmol/L (ref 22–32)
Calcium: 8.9 mg/dL (ref 8.9–10.3)
Chloride: 110 mmol/L (ref 98–111)
Creatinine, Ser: 1.18 mg/dL (ref 0.61–1.24)
GFR, Estimated: >60 mL/min (ref >60)
Glucose, Bld: 98 mg/dL (ref 70–99)
Potassium: 5.1 mmol/L (ref 3.5–5.1)
Sodium: 141 mmol/L (ref 135–145)
Total Bilirubin: 0.4 mg/dL (ref 0.3–1.2)
Total Protein: 6.9 g/dL (ref 6.5–8.1)

## 2020-09-08 LAB — CBC WITH DIFFERENTIAL/PLATELET
Abs Immature Granulocytes: 0.02 10*3/uL (ref 0.00–0.07)
Basophils Absolute: 0.1 10*3/uL (ref 0.0–0.1)
Basophils Relative: 1 %
Eosinophils Absolute: 0.6 10*3/uL — ABNORMAL HIGH (ref 0.0–0.5)
Eosinophils Relative: 7 %
HCT: 33.6 % — ABNORMAL LOW (ref 39.0–52.0)
Hemoglobin: 11 g/dL — ABNORMAL LOW (ref 13.0–17.0)
Immature Granulocytes: 0 %
Lymphocytes Relative: 15 %
Lymphs Abs: 1.3 10*3/uL (ref 0.7–4.0)
MCH: 31.4 pg (ref 26.0–34.0)
MCHC: 32.7 g/dL (ref 30.0–36.0)
MCV: 96 fL (ref 80.0–100.0)
Monocytes Absolute: 0.8 10*3/uL (ref 0.1–1.0)
Monocytes Relative: 9 %
Neutro Abs: 5.8 10*3/uL (ref 1.7–7.7)
Neutrophils Relative %: 68 %
Platelets: 222 10*3/uL (ref 150–400)
RBC: 3.5 MIL/uL — ABNORMAL LOW (ref 4.22–5.81)
RDW: 14.3 % (ref 11.5–15.5)
WBC: 8.6 10*3/uL (ref 4.0–10.5)
nRBC: 0 % (ref 0.0–0.2)

## 2020-09-08 LAB — GLUCOSE, CAPILLARY: Glucose-Capillary: 111 mg/dL — ABNORMAL HIGH (ref 70–99)

## 2020-09-08 MED ORDER — FLUDEOXYGLUCOSE F - 18 (FDG) INJECTION
13.0000 | Freq: Once | INTRAVENOUS | Status: AC | PRN
  Administered 2020-09-08: 12.5 via INTRAVENOUS

## 2020-09-08 MED ORDER — SODIUM CHLORIDE 0.9% FLUSH
10.0000 mL | INTRAVENOUS | Status: AC | PRN
Start: 2020-09-08 — End: 2020-09-08
  Administered 2020-09-08: 10 mL
  Filled 2020-09-08: qty 10

## 2020-09-08 NOTE — Progress Notes (Signed)
Confirmed with Dr Alvy Bimler to access patients port in the [facility] vein for PET scan. Patient said no blood return from port since 2014.  Port flushed well.

## 2020-09-14 ENCOUNTER — Other Ambulatory Visit (HOSPITAL_COMMUNITY): Payer: Self-pay | Admitting: *Deleted

## 2020-09-14 DIAGNOSIS — C7801 Secondary malignant neoplasm of right lung: Secondary | ICD-10-CM

## 2020-09-14 NOTE — Progress Notes (Signed)
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 Partial colectomy on 10/04/2012. 2. FOLFOX in 2014. 3. SBRT to right lower lung from 10/12/2015 to 10/22/2015. 4. SBRT 50 Gy in 5 fractions to right lower lung from 01/02/2017 to 01/12/2017. Stivarga 80 mg 3 weeks on/1 week off from 03/2018 to 12/17/2018.  NGS Results: not done  CURRENT THERAPY: surveillance  BRIEF ONCOLOGIC HISTORY:  Oncology History  Adenocarcinoma of colon ([place])  10/04/2012 Definitive Surgery   Partial collectomy by Dr. [name]    10/04/2012 Pathology Results   4.5 cm poorly differentiated adenocarcinoma, negative margins, 0/7 lymph nodes for metastatic disease.  Oncotype recurrence score of 26 (high)     Chemotherapy   FOLFOX     Adverse Reaction   Progressive peripheral neuropathy     Chemotherapy   Infusional 5 FU.  Completed 6 months worth of adjuvant therapy.    02/23/2014 Procedure   Colonoscopy by Dr. Anthony Sar.    07/01/2015 PET scan   There is malignant range uptake with RLL pulmonary nodule suspicious for metastatic disease or primary pulm neoplasm. Nonspecific focus of uptake in the L femoral neck. No corresponding CT abnormality. Cannot rule out metastasis.    07/06/2015 Imaging   MR C-spine- C5-6 there is a broad-based disc bulge w R paracentral disc protrusion deforming the spinal cord. Moderate R foraminal stenosis. C6-7 there is a central disc protrusion slightly eccentric towards the R and mildly deforming the ventral c-spine     09/16/2015 Procedure   RLL needle biopsy by IR.    09/17/2015 Pathology Results   Metastatic adenocarcinoma consistent with a primary colorectal adenocarcinoma.    10/12/2015 - 10/22/2015 Radiation Therapy   SBRT by  Dr. [name] to RLL pulmonary lesion (biopsy proven to be oligometastasis).    12/09/2015 Imaging   CT chest- 1. Slight interval decrease in size of the right lower lobe pulmonary nodule. Some surrounding radiation changes. 2. No mediastinal or hilar mass or adenopathy. 3. No new pulmonary nodules. 4. Stable underline emphysematous changes. 5. Cirrhotic changes involving the liver but no upper abdominal metastatic disease.   03/14/2016 Imaging   CT chest- 1. Continued mild reduction in the size of the treated right lobe pulmonary nodule. 2. Increased patchy consolidation, reticulation and ground-glass attenuation in the basilar right lower lobe, nonspecific, favor evolving postradiation change, recommend attention on follow-up chest CT. 3. No evidence of new or progressive metastatic disease in the chest. 4. Aortic atherosclerosis. Left main and 3 vessel coronary atherosclerosis. 5. Mild emphysema.    06/30/2016 Imaging   CT CAP- 1. The appearance of the treated right lower lobe nodule is essentially unchanged, and there are evolving postradiation changes in the base of the right lower lobe, as above. No definite signs of new metastatic disease are noted elsewhere in the chest, abdomen or pelvis. 2. Aortic atherosclerosis, in addition to left main and 3 vessel coronary artery disease. Assessment for potential risk factor modification, dietary therapy or pharmacologic therapy may be warranted, if clinically indicated. 3. Morphologic changes in the liver suggestive of cirrhosis, as above. 4. Additional incidental findings, similar prior studies, as above.    11/17/2016 Imaging   CT CAP Study Result   CLINICAL DATA:  Stage IV  colon cancer originally diagnosed in August 2014 status post partial colectomy, with right lower lobe lung metastasis diagnosed August 2017 status post SBRT completed 10/22/2015. Restaging.   EXAM: CT CHEST, ABDOMEN, AND PELVIS WITH CONTRAST    TECHNIQUE: Multidetector CT imaging of the chest, abdomen and pelvis was performed following the standard protocol during bolus administration of intravenous contrast.   CONTRAST:  181mL ISOVUE-300 IOPAMIDOL (ISOVUE-300) INJECTION 61%   COMPARISON:  06/30/2016 CT chest, abdomen and pelvis.   FINDINGS: CT CHEST FINDINGS   Cardiovascular: Normal heart size. No significant pericardial fluid/thickening. Left main, left anterior descending, left circumflex and right coronary atherosclerosis. Atherosclerotic nonaneurysmal thoracic aorta. Normal caliber pulmonary arteries. No central pulmonary emboli. Right internal jugular MediPort terminates at the junction of the right and left brachiocephalic veins.   Mediastinum/Nodes: No discrete thyroid nodules. Unremarkable esophagus. No pathologically enlarged axillary, mediastinal or hilar lymph nodes.   Lungs/Pleura: No pneumothorax. No pleural effusion. There is a nodular 2.7 x 1.6 cm focus of consolidation in the medial basilar right lower lobe (series 3/ image 108), previously 1.6 x 1.1 cm. No appreciable change in patchy subpleural reticulation in both lungs without significant traction bronchiectasis or frank honeycombing. Mild centrilobular and paraseptal emphysema with mild diffuse bronchial wall thickening. No acute consolidative airspace disease or new significant pulmonary nodules.   Musculoskeletal: No aggressive appearing focal osseous lesions. Moderate thoracic spondylosis. Stable chronic left scapular deformity.   CT ABDOMEN PELVIS FINDINGS   Hepatobiliary: Normal liver with no liver mass. Cholecystectomy. No biliary ductal dilatation.   Pancreas: Normal, with no mass or duct dilation.   Spleen: Normal size. No mass.   Adrenals/Urinary Tract: Normal adrenals. No hydronephrosis. No renal masses. Normal bladder.   Stomach/Bowel: Grossly normal stomach. Normal caliber small bowel with no small bowel wall thickening.  Stable appearance of the appendix, within normal limits. Stable postsurgical changes from partial distal colectomy with intact appearing distal colonic anastomosis. Oral contrast transits to the cecum. No large bowel wall thickening or new pericholecystic fat stranding .   Vascular/Lymphatic: Atherosclerotic nonaneurysmal abdominal aorta. Patent portal, splenic, hepatic and renal veins. No pathologically enlarged lymph nodes in the abdomen or pelvis.   Reproductive:  Normal size prostate.   Other: No pneumoperitoneum, ascites or focal fluid collection. Stable subcutaneous calcified granulomas throughout the left lower back and gluteal regions.   Musculoskeletal: No aggressive appearing focal osseous lesions. Marked lumbar spondylosis.   IMPRESSION: 1. Nodular focus of consolidation in the medial basilar right lower lobe is increased in size. While this may represent evolving masslike radiation fibrosis, recurrence of the lung metastasis is not excluded. PET-CT would be useful for further evaluation. Otherwise, continued close chest CT surveillance is advised . 2. No additional potential new sites of metastatic disease in the chest, abdomen or pelvis. 3. No evidence of local tumor recurrence at the distal colonic anastomosis. 4. Left main and 3 vessel coronary atherosclerosis . 5. Aortic Atherosclerosis (ICD10-I70.0) and Emphysema (ICD10-J43.9).      11/30/2016 Relapse/Recurrence   PET: IMPRESSION: 1. Enlarging right lower lobe pulmonary nodule and slight increase in the SUV max suggesting residual neoplasm. No new pulmonary lesions. 2. No findings for local recurrence or abdominal/pelvic metastatic disease.    01/02/2017 - 01/12/2017 Radiation Therapy    The patient saw Dr. [name] for SBRT radiation treatment. The tumor in the right lung was treated with a course of stereotactic body radiation treatment. The patient received 50 Gy In 5 fractions at 10 Gy per  fraction.  10/05/2017 - 10/05/2017 Chemotherapy   The patient had bevacizumab (AVASTIN) 900 mg in sodium chloride 0.9 % 100 mL chemo infusion, 7.3 mg/kg = 925 mg, Intravenous,  Once, 1 of 3 cycles Administration: 900 mg (10/05/2017)   for chemotherapy treatment.       CANCER STAGING: Cancer Staging Adenocarcinoma of colon [facility]) Staging form: Colon and Rectum, AJCC 7th Edition - Clinical stage from 10/04/2012: Stage IIA (T3, N0, M0) - Signed by Baird Cancer, PA-C on 07/15/2015 - Pathologic stage from 09/17/2015: Stage IVA (T3, N0, M1a) - Signed by Baird Cancer, PA-C on 10/05/2015   INTERVAL HISTORY:  Gary Nelson, a 80 y.o. 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 Gary Nelson was last seen on 12/04/2019.   Today he reports feeling well. He reports cellulitis in his left arm for which he was hospitalized from 05/03 until 05/10. He reports a wound on his right abdomen which itches and occasional produces discharge.   REVIEW OF SYSTEMS:  Review of Systems  Constitutional:  Positive for fatigue (depleted). Negative for appetite change.  Respiratory:  Positive for cough and shortness of breath.   Gastrointestinal:  Positive for diarrhea.  Genitourinary:  Positive for bladder incontinence.   Musculoskeletal:  Positive for arthralgias (7/10 hips).  Skin:  Positive for itching (R side of abdomen).  Neurological:  Positive for numbness (tingling in hands and feet).  Psychiatric/Behavioral:  Positive for depression.   All other systems reviewed and are negative.  PAST MEDICAL/SURGICAL HISTORY:  Past Medical History:  Diagnosis Date   Adenocarcinoma of colon ([place])    RLL -  Pulmonary nodule (06/2015, concerning for mets vs primary; to see RAD-ON[facility] Dr. 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/28/2019   Cystitis    Diabetes mellitus without complication  ([place])    Type II   DVT (deep venous thrombosis) ([place]) 2014   post op   Hematuria    Hypertension    Iron deficiency 07/31/2019   Neuropathy    Portal hypertension ([place]) 07/28/2019   Prostatitis    Pulmonary nodule    Sleep apnea    Past Surgical History:  Procedure Laterality Date   BACK SURGERY     CATARACT EXTRACTION     left   CATARACT EXTRACTION W/PHACO Right 04/11/2012   Procedure: CATARACT EXTRACTION PHACO AND INTRAOCULAR LENS PLACEMENT ([place]);  Surgeon: Tonny Branch, MD;  Location: [facility] ORS;  Service: Ophthalmology;  Laterality: Right;  CDE:62.48   CERVICAL FUSION     c4-c5   CHOLECYSTECTOMY     COLON SURGERY  09/29/12   Colon resection for cancer   COLONOSCOPY     EYE SURGERY     cataract   GIVENS CAPSULE STUDY N/A 03/15/2015   Procedure: GIVENS CAPSULE STUDY;  Surgeon: Rogene Houston, MD;  Location: [facility] ENDO SUITE;  Service: Endoscopy;  Laterality: N/A;  730   LEG SURGERY     ORIF FEMUR FRACTURE     right   POSTERIOR CERVICAL FUSION/FORAMINOTOMY N/A 07/27/2015   Procedure: Cervical four to Cervical seven Laminectomy with Cervical four to Thoracic one Dorsal internal fixation and fusion;  Surgeon: Kevan Ny Ditty, MD;  Location: [facility] ORS;  Service: Neurosurgery;  Laterality: N/A;  C4 to C7 Laminectomy with C4 to T1 Dorsal internal fixation and fusion    SOCIAL HISTORY:  Social  History   Socioeconomic History   Marital status: Single    Spouse name: Not on file   Number of children: Not on file   Years of education: Not on file   Highest education level: Not on file  Occupational History   Not on file  Tobacco Use   Smoking status: Former    Packs/day: 1.00    Years: 55.00    Pack years: 55.00    Types: Cigarettes    Start date: 02/07/1955   Smokeless tobacco: Never   Tobacco comments:    quit 2015  Substance and Sexual Activity   Alcohol use: Not Currently    Alcohol/week: 3.0 standard drinks    Types: 3 Cans of beer per week   Drug use: No   Sexual  activity: Yes    Birth control/protection: None  Other Topics Concern   Not on file  Social History Narrative   Not on file   Social Determinants of Health   Financial Resource Strain: Not on file  Food Insecurity: Not on file  Transportation Needs: Not on file  Physical Activity: Not on file  Stress: Not on file  Social Connections: Not on file  Intimate Partner Violence: Not on file    FAMILY HISTORY:  Family History  Problem Relation Age of Onset   Heart disease Father    Heart attack Father    Alzheimer's disease Mother     CURRENT MEDICATIONS:  Current Outpatient Medications  Medication Sig Dispense Refill   aspirin EC 81 MG tablet Take 81 mg by mouth daily.     atorvastatin (LIPITOR) 10 MG tablet Take 10 mg by mouth daily at 6 PM.      [employer] B-12 5000 MCG SUBL Place 1 tablet (5,000 mcg total) under the tongue daily. 30 tablet 6   furosemide (LASIX) 20 MG tablet Take 20 mg by mouth every other day.      gabapentin (NEURONTIN) 300 MG capsule Take 2 capsules (600 mg total) by mouth 3 (three) times daily. (Patient taking differently: Take 600 mg by mouth 2 (two) times daily.) 180 capsule 2   insulin NPH (HUMULIN N,NOVOLIN N) 100 UNIT/ML injection Inject 15-30 Units into the skin See admin instructions. Per pt, 5 units every morning and 5 units in the evening     ketoconazole (NIZORAL) 2 % cream Apply topically as needed.     lisinopril (PRINIVIL,ZESTRIL) 40 MG tablet Take 40 mg by mouth daily.     naproxen (NAPROSYN) 500 MG tablet TAKE 1 TABLET BY MOUTH TWICE A DAY WITH MEALS     OXYGEN Inhale 2 L into the lungs continuous. At night time only     phenylephrine-shark liver oil-mineral oil-petrolatum (PREPARATION H) 0.25-14-74.9 % rectal ointment Place 1 application rectally as needed for hemorrhoids. (Patient not taking: Reported on 08/18/2020)     silver sulfADIAZINE (SILVADENE) 1 % cream Apply 1 application topically as needed.  (Patient not taking: Reported on 08/18/2020)      tamsulosin (FLOMAX) 0.4 MG CAPS capsule Take 1 capsule by mouth daily.     No current facility-administered medications for this visit.    ALLERGIES:  No Known Allergies  PHYSICAL EXAM:  Performance status (ECOG): 2 - Symptomatic, <50% confined to bed  Vitals:   09/15/20 1031  BP: (!) 153/61  Pulse: 95  Resp: 20  Temp: 98 F (36.7 C)  SpO2: 96%   Wt Readings from Last 3 Encounters:  09/15/20 247 lb 9.6 oz (112.3 kg)  04/26/20 250 lb 6.4 oz (113.6 kg)  12/04/19 253 lb 4.9 oz (114.9 kg)   Physical Exam Vitals reviewed.  Constitutional:      Appearance: Normal appearance.     Comments: In wheelchair  Cardiovascular:     Rate and Rhythm: Normal rate and regular rhythm.     Pulses: Normal pulses.     Heart sounds: Normal heart sounds.  Pulmonary:     Effort: Pulmonary effort is normal.     Breath sounds: Normal breath sounds.  Neurological:     General: No focal deficit present.     Mental Status: He is alert and oriented to person, place, and time.  Psychiatric:        Mood and Affect: Mood normal.        Behavior: Behavior normal.     LABORATORY DATA:  I have reviewed the labs as listed.  CBC Latest Ref Rng & Units 09/15/2020 09/08/2020 08/02/2020  WBC 4.0 - 10.5 K/uL 8.1 8.6 8.3  Hemoglobin 13.0 - 17.0 g/dL 11.7(L) 11.0(L) 11.3(L)  Hematocrit 39.0 - 52.0 % 36.8(L) 33.6(L) 36.2(L)  Platelets 150 - 400 K/uL 211 222 240   CMP Latest Ref Rng & Units 09/15/2020 09/08/2020 08/02/2020  Glucose 70 - 99 mg/dL 104(H) 98 116(H)  BUN 8 - 23 mg/dL 39(H) 36(H) 34(H)  Creatinine 0.61 - 1.24 mg/dL 1.09 1.18 1.16  Sodium 135 - 145 mmol/L 138 141 134(L)  Potassium 3.5 - 5.1 mmol/L 5.5(H) 5.1 5.6(H)  Chloride 98 - 111 mmol/L 110 110 107  CO2 22 - 32 mmol/L 23 23 21(L)  Calcium 8.9 - 10.3 mg/dL 8.9 8.9 8.7(L)  Total Protein 6.5 - 8.1 g/dL 7.1 6.9 7.0  Total Bilirubin 0.3 - 1.2 mg/dL 0.7 0.4 0.7  Alkaline Phos 38 - 126 U/L 91 84 76  AST 15 - 41 U/L 22 24 23   ALT 0 - 44 U/L 22 23 19      DIAGNOSTIC IMAGING:  I have independently reviewed the scans and discussed with the patient. NM PET Image Restage (PS) Skull Base to Thigh  Result Date: 09/09/2020 CLINICAL DATA:  Subsequent treatment strategy for colorectal carcinoma. EXAM: NUCLEAR MEDICINE PET SKULL BASE TO THIGH TECHNIQUE: 12.3 mCi F-18 FDG was injected intravenously. Full-ring PET imaging was performed from the skull base to thigh after the radiotracer. CT data was obtained and used for attenuation correction and anatomic localization. Fasting blood glucose: 111 mg/dl COMPARISON:  08/02/2020 FINDINGS: Mediastinal blood pool activity: SUV max 3.32 Liver activity: SUV max NA NECK: No hypermetabolic lymph nodes in the neck. Incidental CT findings: none CHEST: 4 cm lobulated mass within the posteromedial right lower lobe is intensely FDG avid with SUV max of 18.95, image 43/8. No additional FDG avid nodules or masses identified. No FDG avid lymph nodes. Incidental CT findings: Small right pleural effusion. Aortic atherosclerosis. Coronary artery calcifications. ABDOMEN/PELVIS: No abnormal FDG uptake within the liver, pancreas or spleen. No FDG avid adrenal lesions. No hypermetabolic abdominopelvic lymph nodes. Incidental CT findings: Cholecystectomy.  Aortic atherosclerosis. SKELETON: No focal hypermetabolic activity to suggest skeletal metastasis. Asymmetric increased uptake localizing to the area of chronic irregularity involving the right iliac bone is favored to represent inflammatory changes with SUV max of 6.06. Incidental CT findings: none IMPRESSION: 1. Again seen is a mass within the posteromedial right lung base which has gradually increased in size since 01/15/2018 and is concerning for metabolically active tumor. 2. No additional abnormal areas of FDG uptake suspicious for metastatic disease. 3.  Small right pleural effusion. 4.  Aortic Atherosclerosis (ICD10-I70.0). Electronically Signed   By: Kerby Moors M.D.   On:  09/09/2020 14:13     ASSESSMENT:  1.  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 -We have discontinued Stivarga because of unhealed wounds. -His wounds in the groin and in the back has healed up nicely. -CT CAP on 11/27/2019 showed stable small right pleural effusion with chronic atelectasis.  No findings of pulmonary metastatic disease.  Stable changes of cirrhosis with no liver lesions. - CEA on 08/02/2020 increased to 7.5. - CT CAP on 08/02/2020 showed right lower lobe increased anterior lobularity extending towards the hilum. - PET scan on 09/08/2020 showed mass in the posterior medial right lung base increased in size and with SUV 18.95.  No additional abnormal FDG uptake suspicious for metastatic disease.   2.  Chronic ulcers: -He had developed ulcers in the right upper thigh which completely healed since we stopped Stivarga.   PLAN:  1.  Stage IV colon cancer to the lungs: - He had recent elevated CEA and abnormal findings on CT CAP. - I have reviewed PET scan images with the patient which showed recurrence in the right lung base. 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 - We have reviewed his labs today which showed normal LFTs.  Creatinine is 1.09.  Potassium was high at 5.5.  I have reviewed his medications.  He is not on oral potassium supplements. - We talked about restarting him on Stivarga at 80 mg dose 3 weeks on/1 week off. - When he took it last time, he did not have any major tolerability issues.  However we had to discontinue it because of nonhealing right groin wound. - Today he does not have any wounds in the right groin region. - We will send prescription for Stivarga.  I will see him back 2 weeks after starting Stivarga.  We will repeat labs at that time.   Orders placed this encounter:  No orders of the defined types were placed in  this encounter.    Derek Jack, MD [place] [phone]   I, Thana Ates, am acting as a scribe for Dr. Derek Jack.  I, Derek Jack MD, have reviewed the above documentation for accuracy and completeness, and I agree with the above.

## 2020-09-15 ENCOUNTER — Inpatient Hospital Stay (HOSPITAL_COMMUNITY): Payer: Medicare Other

## 2020-09-15 ENCOUNTER — Other Ambulatory Visit: Payer: Self-pay

## 2020-09-15 ENCOUNTER — Ambulatory Visit (HOSPITAL_COMMUNITY): Payer: Medicare Other | Admitting: Hematology

## 2020-09-15 ENCOUNTER — Inpatient Hospital Stay (HOSPITAL_COMMUNITY): Payer: Medicare Other | Attending: Hematology | Admitting: Hematology

## 2020-09-15 ENCOUNTER — Other Ambulatory Visit (HOSPITAL_COMMUNITY): Payer: Self-pay

## 2020-09-15 ENCOUNTER — Telehealth (HOSPITAL_COMMUNITY): Payer: Self-pay | Admitting: Pharmacist

## 2020-09-15 VITALS — BP 153/61 | HR 95 | Temp 98.0°F | Resp 20 | Wt 247.6 lb

## 2020-09-15 DIAGNOSIS — F32A Depression, unspecified: Secondary | ICD-10-CM | POA: Insufficient documentation

## 2020-09-15 DIAGNOSIS — R200 Anesthesia of skin: Secondary | ICD-10-CM | POA: Insufficient documentation

## 2020-09-15 DIAGNOSIS — L03114 Cellulitis of left upper limb: Secondary | ICD-10-CM | POA: Diagnosis not present

## 2020-09-15 DIAGNOSIS — I700 Atherosclerosis of aorta: Secondary | ICD-10-CM | POA: Diagnosis not present

## 2020-09-15 DIAGNOSIS — Z87891 Personal history of nicotine dependence: Secondary | ICD-10-CM | POA: Insufficient documentation

## 2020-09-15 DIAGNOSIS — M2550 Pain in unspecified joint: Secondary | ICD-10-CM | POA: Insufficient documentation

## 2020-09-15 DIAGNOSIS — C7802 Secondary malignant neoplasm of left lung: Secondary | ICD-10-CM | POA: Diagnosis not present

## 2020-09-15 DIAGNOSIS — E119 Type 2 diabetes mellitus without complications: Secondary | ICD-10-CM | POA: Diagnosis not present

## 2020-09-15 DIAGNOSIS — C7801 Secondary malignant neoplasm of right lung: Secondary | ICD-10-CM

## 2020-09-15 DIAGNOSIS — J90 Pleural effusion, not elsewhere classified: Secondary | ICD-10-CM | POA: Diagnosis not present

## 2020-09-15 DIAGNOSIS — Z9049 Acquired absence of other specified parts of digestive tract: Secondary | ICD-10-CM | POA: Insufficient documentation

## 2020-09-15 DIAGNOSIS — J439 Emphysema, unspecified: Secondary | ICD-10-CM | POA: Diagnosis not present

## 2020-09-15 DIAGNOSIS — Z79899 Other long term (current) drug therapy: Secondary | ICD-10-CM | POA: Insufficient documentation

## 2020-09-15 DIAGNOSIS — R5383 Other fatigue: Secondary | ICD-10-CM | POA: Diagnosis not present

## 2020-09-15 DIAGNOSIS — R059 Cough, unspecified: Secondary | ICD-10-CM | POA: Insufficient documentation

## 2020-09-15 DIAGNOSIS — R0602 Shortness of breath: Secondary | ICD-10-CM | POA: Diagnosis not present

## 2020-09-15 DIAGNOSIS — Z86718 Personal history of other venous thrombosis and embolism: Secondary | ICD-10-CM | POA: Diagnosis not present

## 2020-09-15 DIAGNOSIS — L299 Pruritus, unspecified: Secondary | ICD-10-CM | POA: Insufficient documentation

## 2020-09-15 DIAGNOSIS — R32 Unspecified urinary incontinence: Secondary | ICD-10-CM | POA: Diagnosis not present

## 2020-09-15 DIAGNOSIS — I10 Essential (primary) hypertension: Secondary | ICD-10-CM | POA: Diagnosis not present

## 2020-09-15 DIAGNOSIS — K7460 Unspecified cirrhosis of liver: Secondary | ICD-10-CM | POA: Diagnosis not present

## 2020-09-15 DIAGNOSIS — Z8249 Family history of ischemic heart disease and other diseases of the circulatory system: Secondary | ICD-10-CM | POA: Diagnosis not present

## 2020-09-15 DIAGNOSIS — C19 Malignant neoplasm of rectosigmoid junction: Secondary | ICD-10-CM | POA: Insufficient documentation

## 2020-09-15 DIAGNOSIS — R197 Diarrhea, unspecified: Secondary | ICD-10-CM | POA: Insufficient documentation

## 2020-09-15 DIAGNOSIS — Z818 Family history of other mental and behavioral disorders: Secondary | ICD-10-CM | POA: Insufficient documentation

## 2020-09-15 LAB — CBC WITH DIFFERENTIAL/PLATELET
Abs Immature Granulocytes: 0.02 10*3/uL (ref 0.00–0.07)
Basophils Absolute: 0.1 10*3/uL (ref 0.0–0.1)
Basophils Relative: 1 %
Eosinophils Absolute: 0.5 10*3/uL (ref 0.0–0.5)
Eosinophils Relative: 6 %
HCT: 36.8 % — ABNORMAL LOW (ref 39.0–52.0)
Hemoglobin: 11.7 g/dL — ABNORMAL LOW (ref 13.0–17.0)
Immature Granulocytes: 0 %
Lymphocytes Relative: 18 %
Lymphs Abs: 1.5 10*3/uL (ref 0.7–4.0)
MCH: 31.3 pg (ref 26.0–34.0)
MCHC: 31.8 g/dL (ref 30.0–36.0)
MCV: 98.4 fL (ref 80.0–100.0)
Monocytes Absolute: 0.7 10*3/uL (ref 0.1–1.0)
Monocytes Relative: 9 %
Neutro Abs: 5.4 10*3/uL (ref 1.7–7.7)
Neutrophils Relative %: 66 %
Platelets: 211 10*3/uL (ref 150–400)
RBC: 3.74 MIL/uL — ABNORMAL LOW (ref 4.22–5.81)
RDW: 14.6 % (ref 11.5–15.5)
WBC: 8.1 10*3/uL (ref 4.0–10.5)
nRBC: 0 % (ref 0.0–0.2)

## 2020-09-15 LAB — COMPREHENSIVE METABOLIC PANEL
ALT: 22 U/L (ref 0–44)
AST: 22 U/L (ref 15–41)
Albumin: 3.3 g/dL — ABNORMAL LOW (ref 3.5–5.0)
Alkaline Phosphatase: 91 U/L (ref 38–126)
Anion gap: 5 (ref 5–15)
BUN: 39 mg/dL — ABNORMAL HIGH (ref 8–23)
CO2: 23 mmol/L (ref 22–32)
Calcium: 8.9 mg/dL (ref 8.9–10.3)
Chloride: 110 mmol/L (ref 98–111)
Creatinine, Ser: 1.09 mg/dL (ref 0.61–1.24)
GFR, Estimated: >60 mL/min (ref >60)
Glucose, Bld: 104 mg/dL — ABNORMAL HIGH (ref 70–99)
Potassium: 5.5 mmol/L — ABNORMAL HIGH (ref 3.5–5.1)
Sodium: 138 mmol/L (ref 135–145)
Total Bilirubin: 0.7 mg/dL (ref 0.3–1.2)
Total Protein: 7.1 g/dL (ref 6.5–8.1)

## 2020-09-15 MED ORDER — REGORAFENIB 40 MG PO TABS
80.0000 mg | ORAL_TABLET | Freq: Every day | ORAL | 3 refills | Status: DC
  Filled 2020-09-15: qty 42, 21d supply, fill #0

## 2020-09-15 MED ORDER — REGORAFENIB 40 MG PO TABS
80.0000 mg | ORAL_TABLET | Freq: Every day | ORAL | 3 refills | Status: DC
  Filled 2020-09-15 – 2020-09-23 (×2): qty 42, 21d supply, fill #0

## 2020-09-15 NOTE — Patient Instructions (Addendum)
[place] at [facility] Discharge Instructions  You were seen today by Dr. Delton Coombes. He went over your recent results and scans. Dr. Delton Coombes will see you back in 1 month for labs and follow up.   Thank you for choosing [place] at [facility] to provide your oncology and hematology care.  To afford each patient quality time with our provider, please arrive at least 15 minutes before your scheduled appointment time.   If you have a lab appointment with the [facility] please come in thru the Main Entrance and check in at the main information desk  You need to re-schedule your appointment should you arrive 10 or more minutes late.  We strive to give you quality time with our providers, and arriving late affects you and other patients whose appointments are after yours.  Also, if you no show three or more times for appointments you may be dismissed from the clinic at the providers discretion.     Again, thank you for choosing [facility].  Our hope is that these requests will decrease the amount of time that you wait before being seen by our physicians.       _____________________________________________________________  Should you have questions after your visit to [facility], please contact our office at [phone] between the hours of 8:00 a.m. and 4:30 p.m.  Voicemails left after 4:00 p.m. will not be returned until the following business day.  For prescription refill requests, have your pharmacy contact our office and allow 72 hours.    Cancer Center Support Programs:   > Cancer Support Group  2nd Tuesday of the month 1pm-2pm, Journey Room

## 2020-09-15 NOTE — Telephone Encounter (Signed)
Oral Oncology Pharmacist Encounter  Received new prescription for Stivarga (regorafenib) for the treatment of metastatic colon cancer, planned duration until disease progression or unacceptable drug toxicity.  CMP from 09/15/20 assessed, no relevant lab abnormalities. BP elevated with will need to be monitored, would suggest patient keep home BP log. from 09/15/20 Prescription dose and frequency assessed.   Current medication list in Epic reviewed, one DDIs with regorafenib identified: - Atorvastatin: Regorafenib may increase the serum concentration atorvastatin. Patient is on a low dose of atorvastatin. Monitor patient for increase in muscle pain and dark urine. No baseline  dose adjustments needed.  Evaluated chart and no patient barriers to medication adherence identified.   Prescription has been e-scribed to the [facility] for benefits analysis and approval.  Oral Oncology Clinic will continue to follow for insurance authorization, copayment issues, initial counseling and start date.  Darl Pikes, PharmD, BCPS, BCOP, CPP Hematology/Oncology Clinical Pharmacist Practitioner [facility] [phone]  09/15/2020 1:04 PM

## 2020-09-16 ENCOUNTER — Other Ambulatory Visit (HOSPITAL_COMMUNITY): Payer: Self-pay

## 2020-09-16 ENCOUNTER — Encounter (HOSPITAL_COMMUNITY): Payer: Self-pay | Admitting: Oncology

## 2020-09-16 ENCOUNTER — Telehealth (HOSPITAL_COMMUNITY): Payer: Self-pay | Admitting: Pharmacy Technician

## 2020-09-16 LAB — CEA: CEA: 12 ng/mL — ABNORMAL HIGH (ref 0.0–4.7)

## 2020-09-16 NOTE — Telephone Encounter (Signed)
Oral Oncology Patient Advocate Encounter  Prior Authorization for Gary Nelson is currently approved.    Effective dates: 02/07/20 through 02/05/21  Patients co-pay is $2293.90  Oral Oncology Clinic will continue to follow.   [facility] [place] Phone [phone] Fax [phone] 09/16/2020 10:29 AM

## 2020-09-22 ENCOUNTER — Encounter (HOSPITAL_COMMUNITY): Payer: Self-pay | Admitting: Oncology

## 2020-09-22 ENCOUNTER — Other Ambulatory Visit (HOSPITAL_COMMUNITY): Payer: Self-pay

## 2020-09-23 ENCOUNTER — Other Ambulatory Visit (HOSPITAL_COMMUNITY): Payer: Self-pay

## 2020-09-23 NOTE — Telephone Encounter (Addendum)
Oral Chemotherapy Pharmacist Encounter  [place] will deliver medication on 09/24/20, Mr. Deike plans on getting started with his breakfast on 09/25/20.  Patient Education I spoke with patient for overview of new oral chemotherapy medication: Stivarga (regorafenib) for the treatment of metastatic colon cancer, planned duration until disease progression or unacceptable drug toxicity.  Pt is doing well. Counseled patient on administration, dosing, side effects, monitoring, drug-food interactions, safe handling, storage, and disposal. Patient will take 2 tablets (80 mg total) by mouth daily with breakfast. Take with low fat meal.Take for 21 days, then hold for 7 days. Repeat every 28 days.  Side effects include but not limited to: fatigue, hand-foot syndrome, diarrhea, HTN,decreased wbc.    Reviewed with patient importance of keeping a medication schedule and plan for any missed doses.  After discussion with patient no patient barriers to medication adherence identified.   Mr. Ternes voiced understanding and appreciation. All questions answered. Medication handout and calendar provided.  Provided patient with [facility] phone number. Patient knows to call the office with questions or concerns. Oral Chemotherapy Navigation Clinic will continue to follow.  Darl Pikes, PharmD, BCPS, BCOP, CPP Hematology/Oncology Clinical Pharmacist Practitioner [facility] [phone]  09/23/2020 11:39 AM

## 2020-09-23 NOTE — Telephone Encounter (Signed)
Oral Oncology Patient Advocate Encounter   Was successful in securing patient an $60 grant from Patient [facility]) to provide copayment coverage for Stivarga.  This will keep the out of pocket expense at $0.     I have spoken with the patient.    The billing information is as follows and has been shared with [place].   Member ID: [id number] Group ID: [id number] RxBin: [id number] Dates of Eligibility: 06/18/20 through 09/15/21  Fund:  [place] Patient [place] Phone [phone] Fax [phone] 09/23/2020 11:39 AM

## 2020-10-05 ENCOUNTER — Telehealth (HOSPITAL_COMMUNITY): Payer: Self-pay | Admitting: Pharmacy Technician

## 2020-10-06 NOTE — Telephone Encounter (Signed)
Oral Oncology Patient Advocate Encounter  Mailed patient the application for [place] in an effort to reduce patient's out of pocket expense for Stivarga to $0.    Received signed forms on 10/05/20.  Application completed and faxed to [phone] on 10/05/20.   [facility] [employer] phone number for follow up is [phone].   This encounter will be updated until final determination.   [place] Patient [place] Phone [phone] Fax [phone] 10/06/2020 11:49 AM

## 2020-10-10 NOTE — Progress Notes (Signed)
Gary Nelson, [place] [address]   CLINIC:  Medical Oncology/Hematology  PCP:  Ledell Noss, [place] [address] / [place] [phone]   REASON FOR VISIT:  Follow-up for metastatic colon cancer.   CURRENT THERAPY: Stivarga 80 mg daily started on 09/25/2020.  BRIEF ONCOLOGIC HISTORY:  Oncology History  Adenocarcinoma of colon ([place])  10/04/2012 Definitive Surgery   Partial collectomy by Dr. [name]   10/04/2012 Pathology Results   4.5 cm poorly differentiated adenocarcinoma, negative margins, 0/7 lymph nodes for metastatic disease.  Oncotype recurrence score of 26 (high)    Chemotherapy   FOLFOX    Adverse Reaction   Progressive peripheral neuropathy    Chemotherapy   Infusional 5 FU.  Completed 6 months worth of adjuvant therapy.   02/23/2014 Procedure   Colonoscopy by Dr. Anthony Sar.   07/01/2015 PET scan   There is malignant range uptake with RLL pulmonary nodule suspicious for metastatic disease or primary pulm neoplasm. Nonspecific focus of uptake in the L femoral neck. No corresponding CT abnormality. Cannot rule out metastasis.   07/06/2015 Imaging   MR C-spine- C5-6 there is a broad-based disc bulge w R paracentral disc protrusion deforming the spinal cord. Moderate R foraminal stenosis. C6-7 there is a central disc protrusion slightly eccentric towards the R and mildly deforming the ventral c-spine    09/16/2015 Procedure   RLL needle biopsy by IR.   09/17/2015 Pathology Results   Metastatic adenocarcinoma consistent with a primary colorectal adenocarcinoma.   10/12/2015 - 10/22/2015 Radiation Therapy   SBRT by Dr. [name] to RLL pulmonary lesion (biopsy proven to be oligometastasis).   12/09/2015 Imaging   CT chest- 1. Slight interval decrease in size of the right lower lobe pulmonary nodule. Some surrounding radiation changes. 2. No mediastinal or hilar mass or adenopathy. 3. No new pulmonary nodules. 4.  Stable underline emphysematous changes. 5. Cirrhotic changes involving the liver but no upper abdominal metastatic disease.   03/14/2016 Imaging   CT chest- 1. Continued mild reduction in the size of the treated right lobe pulmonary nodule. 2. Increased patchy consolidation, reticulation and ground-glass attenuation in the basilar right lower lobe, nonspecific, favor evolving postradiation change, recommend attention on follow-up chest CT. 3. No evidence of new or progressive metastatic disease in the chest. 4. Aortic atherosclerosis. Left main and 3 vessel coronary atherosclerosis. 5. Mild emphysema.   06/30/2016 Imaging   CT CAP- 1. The appearance of the treated right lower lobe nodule is essentially unchanged, and there are evolving postradiation changes in the base of the right lower lobe, as above. No definite signs of new metastatic disease are noted elsewhere in the chest, abdomen or pelvis. 2. Aortic atherosclerosis, in addition to left main and 3 vessel coronary artery disease. Assessment for potential risk factor modification, dietary therapy or pharmacologic therapy may be warranted, if clinically indicated. 3. Morphologic changes in the liver suggestive of cirrhosis, as above. 4. Additional incidental findings, similar prior studies, as above.   11/17/2016 Imaging   CT CAP Study Result   CLINICAL DATA:  Stage IV colon cancer originally diagnosed in August 2014 status post partial colectomy, with right lower lobe lung metastasis diagnosed August 2017 status post SBRT completed 10/22/2015. Restaging.   EXAM: CT CHEST, ABDOMEN, AND PELVIS WITH CONTRAST   TECHNIQUE: Multidetector CT imaging of the chest, abdomen and pelvis was performed following the standard protocol during bolus administration of intravenous contrast.   CONTRAST:  173mL ISOVUE-300 IOPAMIDOL (ISOVUE-300) INJECTION 61%   COMPARISON:  06/30/2016 CT chest, abdomen and pelvis.   FINDINGS: CT  CHEST FINDINGS   Cardiovascular: Normal heart size. No significant pericardial fluid/thickening. Left main, left anterior descending, left circumflex and right coronary atherosclerosis. Atherosclerotic nonaneurysmal thoracic aorta. Normal caliber pulmonary arteries. No central pulmonary emboli. Right internal jugular MediPort terminates at the junction of the right and left brachiocephalic veins.   Mediastinum/Nodes: No discrete thyroid nodules. Unremarkable esophagus. No pathologically enlarged axillary, mediastinal or hilar lymph nodes.   Lungs/Pleura: No pneumothorax. No pleural effusion. There is a nodular 2.7 x 1.6 cm focus of consolidation in the medial basilar right lower lobe (series 3/ image 108), previously 1.6 x 1.1 cm. No appreciable change in patchy subpleural reticulation in both lungs without significant traction bronchiectasis or frank honeycombing. Mild centrilobular and paraseptal emphysema with mild diffuse bronchial wall thickening. No acute consolidative airspace disease or new significant pulmonary nodules.   Musculoskeletal: No aggressive appearing focal osseous lesions. Moderate thoracic spondylosis. Stable chronic left scapular deformity.   CT ABDOMEN PELVIS FINDINGS   Hepatobiliary: Normal liver with no liver mass. Cholecystectomy. No biliary ductal dilatation.   Pancreas: Normal, with no mass or duct dilation.   Spleen: Normal size. No mass.   Adrenals/Urinary Tract: Normal adrenals. No hydronephrosis. No renal masses. Normal bladder.   Stomach/Bowel: Grossly normal stomach. Normal caliber small bowel with no small bowel wall thickening. Stable appearance of the appendix, within normal limits. Stable postsurgical changes from partial distal colectomy with intact appearing distal colonic anastomosis. Oral contrast transits to the cecum. No large bowel wall thickening or new pericholecystic fat stranding .   Vascular/Lymphatic: Atherosclerotic  nonaneurysmal abdominal aorta. Patent portal, splenic, hepatic and renal veins. No pathologically enlarged lymph nodes in the abdomen or pelvis.   Reproductive:  Normal size prostate.   Other: No pneumoperitoneum, ascites or focal fluid collection. Stable subcutaneous calcified granulomas throughout the left lower back and gluteal regions.   Musculoskeletal: No aggressive appearing focal osseous lesions. Marked lumbar spondylosis.   IMPRESSION: 1. Nodular focus of consolidation in the medial basilar right lower lobe is increased in size. While this may represent evolving masslike radiation fibrosis, recurrence of the lung metastasis is not excluded. PET-CT would be useful for further evaluation. Otherwise, continued close chest CT surveillance is advised . 2. No additional potential new sites of metastatic disease in the chest, abdomen or pelvis. 3. No evidence of local tumor recurrence at the distal colonic anastomosis. 4. Left main and 3 vessel coronary atherosclerosis . 5. Aortic Atherosclerosis (ICD10-I70.0) and Emphysema (ICD10-J43.9).      11/30/2016 Relapse/Recurrence   PET: IMPRESSION: 1. Enlarging right lower lobe pulmonary nodule and slight increase in the SUV max suggesting residual neoplasm. No new pulmonary lesions. 2. No findings for local recurrence or abdominal/pelvic metastatic disease.   01/02/2017 - 01/12/2017 Radiation Therapy    The patient saw Dr. [name] for SBRT radiation treatment. The tumor in the right lung was treated with a course of stereotactic body radiation treatment. The patient received 50 Gy In 5 fractions at 10 Gy per fraction.      10/05/2017 - 10/05/2017 Chemotherapy   The patient had bevacizumab (AVASTIN) 900 mg in sodium chloride 0.9 % 100 mL chemo infusion, 7.3 mg/kg = 925 mg, Intravenous,  Once, 1 of 3 cycles Administration: 900 mg (10/05/2017)   for chemotherapy treatment.       CANCER STAGING: Cancer Staging Adenocarcinoma  of colon [facility]) Staging form: Colon and  Rectum, AJCC 7th Edition - Clinical stage from 10/04/2012: Stage IIA (T3, N0, M0) - Signed by Baird Cancer, PA-C on 07/15/2015 - Pathologic stage from 09/17/2015: Stage IVA (T3, N0, M1a) - Signed by Baird Cancer, PA-C on 10/05/2015   INTERVAL HISTORY:  Mr. ADRON GEISEL, a 80 y.o. 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 Gary Nelson was last seen on 09/15/2020.   Today he reports feeling well. He denies n/v/d, and he reports some soreness in his gums.   REVIEW OF SYSTEMS:  Review of Systems  Constitutional:  Positive for fatigue (depleted). Negative for appetite change.  Respiratory:  Positive for shortness of breath.   Cardiovascular:  Positive for leg swelling (feet and ankle).  Gastrointestinal:  Negative for diarrhea, nausea and vomiting.  Genitourinary:  Positive for bladder incontinence.   Neurological:  Positive for numbness (legs).  Psychiatric/Behavioral:  Positive for depression. The patient is nervous/anxious.   All other systems reviewed and are negative.  PAST MEDICAL/SURGICAL HISTORY:  Past Medical History:  Diagnosis Date   Adenocarcinoma of colon ([place])    RLL -  Pulmonary nodule (06/2015, concerning for mets vs primary; to see RAD-ONC Dr. 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   Cystitis    Diabetes mellitus without complication ([place])    Type II   DVT (deep venous thrombosis) ([place]) 2014   post op   Hematuria    Hypertension    Iron deficiency 07/31/2019   Neuropathy    Portal hypertension ([place]) 07/28/2019   Prostatitis    Pulmonary nodule    Sleep apnea    Past Surgical History:  Procedure Laterality Date   BACK SURGERY     CATARACT EXTRACTION     left   CATARACT EXTRACTION W/PHACO Right 04/11/2012   Procedure: CATARACT EXTRACTION PHACO AND INTRAOCULAR LENS PLACEMENT ([place]);  Surgeon:  Tonny Branch, MD;  Location: [facility] ORS;  Service: Ophthalmology;  Laterality: Right;  CDE:62.48   CERVICAL FUSION     c4-c5   CHOLECYSTECTOMY     COLON SURGERY  09/29/12   Colon resection for cancer   COLONOSCOPY     EYE SURGERY     cataract   GIVENS CAPSULE STUDY N/A 03/15/2015   Procedure: GIVENS CAPSULE STUDY;  Surgeon: Rogene Houston, MD;  Location: [facility] ENDO SUITE;  Service: Endoscopy;  Laterality: N/A;  730   LEG SURGERY     ORIF FEMUR FRACTURE     right   POSTERIOR CERVICAL FUSION/FORAMINOTOMY N/A 07/27/2015   Procedure: Cervical four to Cervical seven Laminectomy with Cervical four to Thoracic one Dorsal internal fixation and fusion;  Surgeon: Kevan Ny Ditty, MD;  Location: [facility] NEURO ORS;  Service: Neurosurgery;  Laterality: N/A;  C4 to C7 Laminectomy with C4 to T1 Dorsal internal fixation and fusion    SOCIAL HISTORY:  Social History   Socioeconomic History   Marital status: Single    Spouse name: Not on file   Number of children: Not on file   Years of education: Not on file   Highest education level: Not on file  Occupational History   Not on file  Tobacco Use   Smoking status: Former    Packs/day: 1.00    Years: 55.00    Pack years: 55.00    Types: Cigarettes    Start date: 02/07/1955  Smokeless tobacco: Never   Tobacco comments:    quit 2015  Substance and Sexual Activity   Alcohol use: Not Currently    Alcohol/week: 3.0 standard drinks    Types: 3 Cans of beer per week   Drug use: No   Sexual activity: Yes    Birth control/protection: None  Other Topics Concern   Not on file  Social History Narrative   Not on file   Social Determinants of Health   Financial Resource Strain: Not on file  Food Insecurity: Not on file  Transportation Needs: Not on file  Physical Activity: Not on file  Stress: Not on file  Social Connections: Not on file  Intimate Partner Violence: Not on file    FAMILY HISTORY:  Family History  Problem Relation Age of Onset    Heart disease Father    Heart attack Father    Alzheimer's disease Mother     CURRENT MEDICATIONS:  Current Outpatient Medications  Medication Sig Dispense Refill   aspirin EC 81 MG tablet Take 81 mg by mouth daily.     atorvastatin (LIPITOR) 10 MG tablet Take 10 mg by mouth daily at 6 PM.      [employer] B-12 5000 MCG SUBL Place 1 tablet (5,000 mcg total) under the tongue daily. 30 tablet 6   furosemide (LASIX) 20 MG tablet Take 20 mg by mouth every other day.      gabapentin (NEURONTIN) 300 MG capsule Take 2 capsules (600 mg total) by mouth 3 (three) times daily. (Patient taking differently: Take 600 mg by mouth 2 (two) times daily.) 180 capsule 2   insulin NPH (HUMULIN N,NOVOLIN N) 100 UNIT/ML injection Inject 15-30 Units into the skin See admin instructions. Per pt, 5 units every morning and 5 units in the evening     ketoconazole (NIZORAL) 2 % cream Apply topically as needed.     lisinopril (PRINIVIL,ZESTRIL) 40 MG tablet Take 40 mg by mouth daily.     naproxen (NAPROSYN) 500 MG tablet TAKE 1 TABLET BY MOUTH TWICE A DAY WITH MEALS     OXYGEN Inhale 2 L into the lungs continuous. At night time only     phenylephrine-shark liver oil-mineral oil-petrolatum (PREPARATION H) 0.25-14-74.9 % rectal ointment Place 1 application rectally as needed for hemorrhoids. (Patient not taking: Reported on 08/18/2020)     regorafenib (STIVARGA) 40 MG tablet Take 2 tablets (80 mg total) by mouth daily with breakfast. Take with low fat meal.Take for 21 days, then hold for 7 days. Repeat every 28 days. 42 tablet 3   silver sulfADIAZINE (SILVADENE) 1 % cream Apply 1 application topically as needed.  (Patient not taking: Reported on 08/18/2020)     tamsulosin (FLOMAX) 0.4 MG CAPS capsule Take 1 capsule by mouth daily.     No current facility-administered medications for this visit.    ALLERGIES:  No Known Allergies  PHYSICAL EXAM:  Performance status (ECOG): 2 - Symptomatic, <50% confined to bed  There were no  vitals filed for this visit. Wt Readings from Last 3 Encounters:  09/15/20 247 lb 9.6 oz (112.3 kg)  04/26/20 250 lb 6.4 oz (113.6 kg)  12/04/19 253 lb 4.9 oz (114.9 kg)   Physical Exam Vitals reviewed.  Constitutional:      Appearance: Normal appearance.     Comments: In wheelchair  HENT:     Mouth/Throat:     Dentition: No gum lesions.  Cardiovascular:     Rate and Rhythm: Normal rate and regular  rhythm.     Pulses: Normal pulses.     Heart sounds: Normal heart sounds.  Pulmonary:     Effort: Pulmonary effort is normal.     Breath sounds: Normal breath sounds.  Neurological:     General: No focal deficit present.     Mental Status: He is alert and oriented to person, place, and time.  Psychiatric:        Mood and Affect: Mood normal.        Behavior: Behavior normal.     LABORATORY DATA:  I have reviewed the labs as listed.  CBC Latest Ref Rng & Units 09/15/2020 09/08/2020 08/02/2020  WBC 4.0 - 10.5 K/uL 8.1 8.6 8.3  Hemoglobin 13.0 - 17.0 g/dL 11.7(L) 11.0(L) 11.3(L)  Hematocrit 39.0 - 52.0 % 36.8(L) 33.6(L) 36.2(L)  Platelets 150 - 400 K/uL 211 222 240   CMP Latest Ref Rng & Units 09/15/2020 09/08/2020 08/02/2020  Glucose 70 - 99 mg/dL 104(H) 98 116(H)  BUN 8 - 23 mg/dL 39(H) 36(H) 34(H)  Creatinine 0.61 - 1.24 mg/dL 1.09 1.18 1.16  Sodium 135 - 145 mmol/L 138 141 134(L)  Potassium 3.5 - 5.1 mmol/L 5.5(H) 5.1 5.6(H)  Chloride 98 - 111 mmol/L 110 110 107  CO2 22 - 32 mmol/L 23 23 21(L)  Calcium 8.9 - 10.3 mg/dL 8.9 8.9 8.7(L)  Total Protein 6.5 - 8.1 g/dL 7.1 6.9 7.0  Total Bilirubin 0.3 - 1.2 mg/dL 0.7 0.4 0.7  Alkaline Phos 38 - 126 U/L 91 84 76  AST 15 - 41 U/L 22 24 23   ALT 0 - 44 U/L 22 23 19     DIAGNOSTIC IMAGING:  I have independently reviewed the scans and discussed with the patient. No results found.   ASSESSMENT:  1.  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 -We  have discontinued Stivarga because of unhealed wounds. -His wounds in the groin and in the back has healed up nicely. -CT CAP on 11/27/2019 showed stable small right pleural effusion with chronic atelectasis.  No findings of pulmonary metastatic disease.  Stable changes of cirrhosis with no liver lesions. - CEA on 08/02/2020 increased to 7.5. - CT CAP on 08/02/2020 showed right lower lobe increased anterior lobularity extending towards the hilum. - PET scan on 09/08/2020 showed mass in the posterior medial right lung base increased in size and with SUV 18.95.  No additional abnormal FDG uptake suspicious for metastatic disease. - Stivarga 80 mg 3 weeks on/1 week off started on 09/25/2020.   2.  Chronic ulcers: -He had developed ulcers in the right upper thigh which completely healed since we stopped Stivarga.   PLAN:  1.  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 - He did not notice any mucositis, GI symptoms like nausea vomiting or diarrhea. - He reported some soreness in the gums. - We will prescribe [employer] Magic mouthwash to be used as needed. - Reviewed labs from today which showed normal LFTs.  Albumin is slightly low at 2.9.  CBC was grossly normal. - Physical examination did not reveal any mucositis or hand-foot skin reaction. - Right groin skin breakdown is stable per patient. - RTC 4 weeks for follow-up with repeat labs.  We will plan on imaging 3 months from the start of [place].   Orders placed this encounter:  No orders of the defined types were placed in this encounter.    Derek Jack, MD [place] [phone]   I, Thana Ates, am acting as a scribe for Dr. Derek Jack.  I, Derek Jack MD, have reviewed the above documentation for accuracy and completeness, and I agree with the above.

## 2020-10-12 ENCOUNTER — Encounter (HOSPITAL_COMMUNITY): Payer: Self-pay | Admitting: Hematology

## 2020-10-12 ENCOUNTER — Inpatient Hospital Stay (HOSPITAL_COMMUNITY): Payer: Medicare Other

## 2020-10-12 ENCOUNTER — Inpatient Hospital Stay (HOSPITAL_COMMUNITY): Payer: Medicare Other | Attending: Hematology | Admitting: Hematology

## 2020-10-12 ENCOUNTER — Other Ambulatory Visit: Payer: Self-pay

## 2020-10-12 VITALS — BP 144/66 | HR 88 | Temp 97.1°F | Resp 20 | Wt 240.2 lb

## 2020-10-12 DIAGNOSIS — K7460 Unspecified cirrhosis of liver: Secondary | ICD-10-CM | POA: Diagnosis not present

## 2020-10-12 DIAGNOSIS — Z87891 Personal history of nicotine dependence: Secondary | ICD-10-CM | POA: Insufficient documentation

## 2020-10-12 DIAGNOSIS — C7801 Secondary malignant neoplasm of right lung: Secondary | ICD-10-CM | POA: Insufficient documentation

## 2020-10-12 DIAGNOSIS — E119 Type 2 diabetes mellitus without complications: Secondary | ICD-10-CM | POA: Diagnosis not present

## 2020-10-12 DIAGNOSIS — Z8249 Family history of ischemic heart disease and other diseases of the circulatory system: Secondary | ICD-10-CM | POA: Insufficient documentation

## 2020-10-12 DIAGNOSIS — R5383 Other fatigue: Secondary | ICD-10-CM | POA: Insufficient documentation

## 2020-10-12 DIAGNOSIS — Z9049 Acquired absence of other specified parts of digestive tract: Secondary | ICD-10-CM | POA: Diagnosis not present

## 2020-10-12 DIAGNOSIS — M7989 Other specified soft tissue disorders: Secondary | ICD-10-CM | POA: Insufficient documentation

## 2020-10-12 DIAGNOSIS — I10 Essential (primary) hypertension: Secondary | ICD-10-CM | POA: Diagnosis not present

## 2020-10-12 DIAGNOSIS — R200 Anesthesia of skin: Secondary | ICD-10-CM | POA: Diagnosis not present

## 2020-10-12 DIAGNOSIS — F32A Depression, unspecified: Secondary | ICD-10-CM | POA: Diagnosis not present

## 2020-10-12 DIAGNOSIS — Z79899 Other long term (current) drug therapy: Secondary | ICD-10-CM | POA: Insufficient documentation

## 2020-10-12 DIAGNOSIS — J439 Emphysema, unspecified: Secondary | ICD-10-CM | POA: Diagnosis not present

## 2020-10-12 DIAGNOSIS — C19 Malignant neoplasm of rectosigmoid junction: Secondary | ICD-10-CM | POA: Diagnosis present

## 2020-10-12 DIAGNOSIS — Z86718 Personal history of other venous thrombosis and embolism: Secondary | ICD-10-CM | POA: Diagnosis not present

## 2020-10-12 DIAGNOSIS — I700 Atherosclerosis of aorta: Secondary | ICD-10-CM | POA: Diagnosis not present

## 2020-10-12 DIAGNOSIS — Z818 Family history of other mental and behavioral disorders: Secondary | ICD-10-CM | POA: Diagnosis not present

## 2020-10-12 DIAGNOSIS — R0602 Shortness of breath: Secondary | ICD-10-CM | POA: Diagnosis not present

## 2020-10-12 DIAGNOSIS — R32 Unspecified urinary incontinence: Secondary | ICD-10-CM | POA: Diagnosis not present

## 2020-10-12 LAB — CBC WITH DIFFERENTIAL/PLATELET
Abs Immature Granulocytes: 0.04 10*3/uL (ref 0.00–0.07)
Basophils Absolute: 0.1 10*3/uL (ref 0.0–0.1)
Basophils Relative: 1 %
Eosinophils Absolute: 0.6 10*3/uL — ABNORMAL HIGH (ref 0.0–0.5)
Eosinophils Relative: 6 %
HCT: 36.6 % — ABNORMAL LOW (ref 39.0–52.0)
Hemoglobin: 12.3 g/dL — ABNORMAL LOW (ref 13.0–17.0)
Immature Granulocytes: 0 %
Lymphocytes Relative: 11 %
Lymphs Abs: 1.2 10*3/uL (ref 0.7–4.0)
MCH: 31.5 pg (ref 26.0–34.0)
MCHC: 33.6 g/dL (ref 30.0–36.0)
MCV: 93.8 fL (ref 80.0–100.0)
Monocytes Absolute: 0.9 10*3/uL (ref 0.1–1.0)
Monocytes Relative: 8 %
Neutro Abs: 7.8 10*3/uL — ABNORMAL HIGH (ref 1.7–7.7)
Neutrophils Relative %: 74 %
Platelets: 209 10*3/uL (ref 150–400)
RBC: 3.9 MIL/uL — ABNORMAL LOW (ref 4.22–5.81)
RDW: 13.9 % (ref 11.5–15.5)
WBC: 10.7 10*3/uL — ABNORMAL HIGH (ref 4.0–10.5)
nRBC: 0 % (ref 0.0–0.2)

## 2020-10-12 LAB — COMPREHENSIVE METABOLIC PANEL
ALT: 25 U/L (ref 0–44)
AST: 23 U/L (ref 15–41)
Albumin: 2.9 g/dL — ABNORMAL LOW (ref 3.5–5.0)
Alkaline Phosphatase: 95 U/L (ref 38–126)
Anion gap: 5 (ref 5–15)
BUN: 37 mg/dL — ABNORMAL HIGH (ref 8–23)
CO2: 23 mmol/L (ref 22–32)
Calcium: 8.4 mg/dL — ABNORMAL LOW (ref 8.9–10.3)
Chloride: 110 mmol/L (ref 98–111)
Creatinine, Ser: 1.07 mg/dL (ref 0.61–1.24)
GFR, Estimated: >60 mL/min (ref >60)
Glucose, Bld: 166 mg/dL — ABNORMAL HIGH (ref 70–99)
Potassium: 5.1 mmol/L (ref 3.5–5.1)
Sodium: 138 mmol/L (ref 135–145)
Total Bilirubin: 0.6 mg/dL (ref 0.3–1.2)
Total Protein: 6.8 g/dL (ref 6.5–8.1)

## 2020-10-12 NOTE — Progress Notes (Signed)
Pt is taking Stivarga as prescribed with no side effects.

## 2020-10-12 NOTE — Patient Instructions (Signed)
[place] at [facility] Discharge Instructions  You were seen today by Dr. Delton Coombes. He went over your recent results and scans. Dr. Delton Coombes will see you back in 1 month for labs and follow up.   Thank you for choosing [place] at [facility] to provide your oncology and hematology care.  To afford each patient quality time with our provider, please arrive at least 15 minutes before your scheduled appointment time.   If you have a lab appointment with the [facility] please come in thru the Main Entrance and check in at the main information desk  You need to re-schedule your appointment should you arrive 10 or more minutes late.  We strive to give you quality time with our providers, and arriving late affects you and other patients whose appointments are after yours.  Also, if you no show three or more times for appointments you may be dismissed from the clinic at the providers discretion.     Again, thank you for choosing [facility].  Our hope is that these requests will decrease the amount of time that you wait before being seen by our physicians.       _____________________________________________________________  Should you have questions after your visit to [facility], please contact our office at [phone] between the hours of 8:00 a.m. and 4:30 p.m.  Voicemails left after 4:00 p.m. will not be returned until the following business day.  For prescription refill requests, have your pharmacy contact our office and allow 72 hours.    Cancer Center Support Programs:   > Cancer Support Group  2nd Tuesday of the month 1pm-2pm, Journey Room

## 2020-10-13 LAB — CEA: CEA: 12.9 ng/mL — ABNORMAL HIGH (ref 0.0–4.7)

## 2020-10-18 NOTE — Telephone Encounter (Signed)
Oral Oncology Patient Advocate Encounter  Received notification from [facility] Patient Assistance program that patient has been successfully enrolled into their program to receive Stivarga from the manufacturer at $0 out of pocket until 02/05/21.    I called and spoke with patient.  He knows we will have to re-apply.   Specialty Pharmacy that will dispense medication is [facility].  Patient knows to call the office with questions or concerns.   Oral Oncology Clinic will continue to follow.  [place] Patient [place] Phone [phone] Fax [phone] 10/18/2020 3:55 PM

## 2020-11-08 ENCOUNTER — Other Ambulatory Visit (HOSPITAL_COMMUNITY): Payer: Self-pay | Admitting: *Deleted

## 2020-11-08 DIAGNOSIS — C7801 Secondary malignant neoplasm of right lung: Secondary | ICD-10-CM

## 2020-11-09 NOTE — Progress Notes (Signed)
Gary Nelson, [place] [address]   CLINIC:  Medical Oncology/Hematology  PCP:  Ledell Noss, [place] / [place] [address] [phone]   REASON FOR VISIT:  Follow-up for metastatic colon cancer  PRIOR THERAPY: none  NGS Results: not done  CURRENT THERAPY: Stivarga 80 mg daily started on 09/25/2020  BRIEF ONCOLOGIC HISTORY:  Oncology History  Adenocarcinoma of colon ([place])  10/04/2012 Definitive Surgery   Partial collectomy by Dr. [name]   10/04/2012 Pathology Results   4.5 cm poorly differentiated adenocarcinoma, negative margins, 0/7 lymph nodes for metastatic disease.  Oncotype recurrence score of 26 (high)    Chemotherapy   FOLFOX    Adverse Reaction   Progressive peripheral neuropathy    Chemotherapy   Infusional 5 FU.  Completed 6 months worth of adjuvant therapy.   02/23/2014 Procedure   Colonoscopy by Dr. Anthony Sar.   07/01/2015 PET scan   There is malignant range uptake with RLL pulmonary nodule suspicious for metastatic disease or primary pulm neoplasm. Nonspecific focus of uptake in the L femoral neck. No corresponding CT abnormality. Cannot rule out metastasis.   07/06/2015 Imaging   MR C-spine- C5-6 there is a broad-based disc bulge w R paracentral disc protrusion deforming the spinal cord. Moderate R foraminal stenosis. C6-7 there is a central disc protrusion slightly eccentric towards the R and mildly deforming the ventral c-spine    09/16/2015 Procedure   RLL needle biopsy by IR.   09/17/2015 Pathology Results   Metastatic adenocarcinoma consistent with a primary colorectal adenocarcinoma.   10/12/2015 - 10/22/2015 Radiation Therapy   SBRT by Dr. [name] to RLL pulmonary lesion (biopsy proven to be oligometastasis).   12/09/2015 Imaging   CT chest- 1. Slight interval decrease in size of the right lower lobe pulmonary nodule. Some surrounding radiation changes. 2. No mediastinal or hilar mass or  adenopathy. 3. No new pulmonary nodules. 4. Stable underline emphysematous changes. 5. Cirrhotic changes involving the liver but no upper abdominal metastatic disease.   03/14/2016 Imaging   CT chest- 1. Continued mild reduction in the size of the treated right lobe pulmonary nodule. 2. Increased patchy consolidation, reticulation and ground-glass attenuation in the basilar right lower lobe, nonspecific, favor evolving postradiation change, recommend attention on follow-up chest CT. 3. No evidence of new or progressive metastatic disease in the chest. 4. Aortic atherosclerosis. Left main and 3 vessel coronary atherosclerosis. 5. Mild emphysema.   06/30/2016 Imaging   CT CAP- 1. The appearance of the treated right lower lobe nodule is essentially unchanged, and there are evolving postradiation changes in the base of the right lower lobe, as above. No definite signs of new metastatic disease are noted elsewhere in the chest, abdomen or pelvis. 2. Aortic atherosclerosis, in addition to left main and 3 vessel coronary artery disease. Assessment for potential risk factor modification, dietary therapy or pharmacologic therapy may be warranted, if clinically indicated. 3. Morphologic changes in the liver suggestive of cirrhosis, as above. 4. Additional incidental findings, similar prior studies, as above.   11/17/2016 Imaging   CT CAP Study Result   CLINICAL DATA:  Stage IV colon cancer originally diagnosed in August 2014 status post partial colectomy, with right lower lobe lung metastasis diagnosed August 2017 status post SBRT completed 10/22/2015. Restaging.   EXAM: CT CHEST, ABDOMEN, AND PELVIS WITH CONTRAST   TECHNIQUE: Multidetector CT imaging of the chest, abdomen and pelvis was performed following the standard protocol during bolus  administration of intravenous contrast.   CONTRAST:  171mL ISOVUE-300 IOPAMIDOL (ISOVUE-300) INJECTION 61%   COMPARISON:  06/30/2016 CT  chest, abdomen and pelvis.   FINDINGS: CT CHEST FINDINGS   Cardiovascular: Normal heart size. No significant pericardial fluid/thickening. Left main, left anterior descending, left circumflex and right coronary atherosclerosis. Atherosclerotic nonaneurysmal thoracic aorta. Normal caliber pulmonary arteries. No central pulmonary emboli. Right internal jugular MediPort terminates at the junction of the right and left brachiocephalic veins.   Mediastinum/Nodes: No discrete thyroid nodules. Unremarkable esophagus. No pathologically enlarged axillary, mediastinal or hilar lymph nodes.   Lungs/Pleura: No pneumothorax. No pleural effusion. There is a nodular 2.7 x 1.6 cm focus of consolidation in the medial basilar right lower lobe (series 3/ image 108), previously 1.6 x 1.1 cm. No appreciable change in patchy subpleural reticulation in both lungs without significant traction bronchiectasis or frank honeycombing. Mild centrilobular and paraseptal emphysema with mild diffuse bronchial wall thickening. No acute consolidative airspace disease or new significant pulmonary nodules.   Musculoskeletal: No aggressive appearing focal osseous lesions. Moderate thoracic spondylosis. Stable chronic left scapular deformity.   CT ABDOMEN PELVIS FINDINGS   Hepatobiliary: Normal liver with no liver mass. Cholecystectomy. No biliary ductal dilatation.   Pancreas: Normal, with no mass or duct dilation.   Spleen: Normal size. No mass.   Adrenals/Urinary Tract: Normal adrenals. No hydronephrosis. No renal masses. Normal bladder.   Stomach/Bowel: Grossly normal stomach. Normal caliber small bowel with no small bowel wall thickening. Stable appearance of the appendix, within normal limits. Stable postsurgical changes from partial distal colectomy with intact appearing distal colonic anastomosis. Oral contrast transits to the cecum. No large bowel wall thickening or new pericholecystic fat stranding  .   Vascular/Lymphatic: Atherosclerotic nonaneurysmal abdominal aorta. Patent portal, splenic, hepatic and renal veins. No pathologically enlarged lymph nodes in the abdomen or pelvis.   Reproductive:  Normal size prostate.   Other: No pneumoperitoneum, ascites or focal fluid collection. Stable subcutaneous calcified granulomas throughout the left lower back and gluteal regions.   Musculoskeletal: No aggressive appearing focal osseous lesions. Marked lumbar spondylosis.   IMPRESSION: 1. Nodular focus of consolidation in the medial basilar right lower lobe is increased in size. While this may represent evolving masslike radiation fibrosis, recurrence of the lung metastasis is not excluded. PET-CT would be useful for further evaluation. Otherwise, continued close chest CT surveillance is advised . 2. No additional potential new sites of metastatic disease in the chest, abdomen or pelvis. 3. No evidence of local tumor recurrence at the distal colonic anastomosis. 4. Left main and 3 vessel coronary atherosclerosis . 5. Aortic Atherosclerosis (ICD10-I70.0) and Emphysema (ICD10-J43.9).      11/30/2016 Relapse/Recurrence   PET: IMPRESSION: 1. Enlarging right lower lobe pulmonary nodule and slight increase in the SUV max suggesting residual neoplasm. No new pulmonary lesions. 2. No findings for local recurrence or abdominal/pelvic metastatic disease.   01/02/2017 - 01/12/2017 Radiation Therapy    The patient saw Dr. [name] for SBRT radiation treatment. The tumor in the right lung was treated with a course of stereotactic body radiation treatment. The patient received 50 Gy In 5 fractions at 10 Gy per fraction.      10/05/2017 - 10/05/2017 Chemotherapy   The patient had bevacizumab (AVASTIN) 900 mg in sodium chloride 0.9 % 100 mL chemo infusion, 7.3 mg/kg = 925 mg, Intravenous,  Once, 1 of 3 cycles Administration: 900 mg (10/05/2017)   for chemotherapy treatment.       CANCER  STAGING: Cancer Staging  Adenocarcinoma of colon [facility]) Staging form: Colon and Rectum, AJCC 7th Edition - Clinical stage from 10/04/2012: Stage IIA (T3, N0, M0) - Signed by Baird Cancer, PA-C on 07/15/2015 - Pathologic stage from 09/17/2015: Stage IVA (T3, N0, M1a) - Signed by Baird Cancer, PA-C on 10/05/2015   INTERVAL HISTORY:  Gary Nelson, a 80 y.o. male, returns for routine follow-up of his metastatic colon cancer. Santez was last seen on 10/12/2020.   Today he reports feeling well. He reports his rash has resolved. He reports that he has been drinking Propel sports drink. He reports his mouth sores have improved. He denies n/v/d and skin peeling.   REVIEW OF SYSTEMS:  Review of Systems  Constitutional:  Positive for fatigue (depleted). Negative for appetite change.  HENT:   Positive for mouth sores (improved).   Respiratory:  Positive for cough and shortness of breath.   Gastrointestinal:  Negative for diarrhea, nausea and vomiting.  Musculoskeletal:  Positive for arthralgias (4/10 hips and feet) and myalgias (4/10 legs).  Skin:  Negative for rash.  Psychiatric/Behavioral:  Positive for sleep disturbance.   All other systems reviewed and are negative.  PAST MEDICAL/SURGICAL HISTORY:  Past Medical History:  Diagnosis Date   Adenocarcinoma of colon ([place])    RLL -  Pulmonary nodule (06/2015, concerning for mets vs primary; to see RAD-ONC Dr. 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/28/2019   Cystitis    Diabetes mellitus without complication ([place])    Type II   DVT (deep venous thrombosis) ([place]) 2014   post op   Hematuria    Hypertension    Iron deficiency 07/31/2019   Neuropathy    Portal hypertension ([place]) 07/28/2019   Prostatitis    Pulmonary nodule    Sleep apnea    Past Surgical History:  Procedure Laterality Date   BACK SURGERY     CATARACT EXTRACTION     left    CATARACT EXTRACTION W/PHACO Right 04/11/2012   Procedure: CATARACT EXTRACTION PHACO AND INTRAOCULAR LENS PLACEMENT ([place]);  Surgeon: Tonny Branch, MD;  Location: [facility] ORS;  Service: Ophthalmology;  Laterality: Right;  CDE:62.48   CERVICAL FUSION     c4-c5   CHOLECYSTECTOMY     COLON SURGERY  09/29/12   Colon resection for cancer   COLONOSCOPY     EYE SURGERY     cataract   GIVENS CAPSULE STUDY N/A 03/15/2015   Procedure: GIVENS CAPSULE STUDY;  Surgeon: Rogene Houston, MD;  Location: [facility] ENDO SUITE;  Service: Endoscopy;  Laterality: N/A;  730   LEG SURGERY     ORIF FEMUR FRACTURE     right   POSTERIOR CERVICAL FUSION/FORAMINOTOMY N/A 07/27/2015   Procedure: Cervical four to Cervical seven Laminectomy with Cervical four to Thoracic one Dorsal internal fixation and fusion;  Surgeon: Kevan Ny Ditty, MD;  Location: [place] [facility] [facility]S;  Service: Neurosurgery;  Laterality: N/A;  C4 to C7 Laminectomy with C4 to T1 Dorsal internal fixation and fusion    SOCIAL HISTORY:  Social History   Socioeconomic History   Marital status: Single    Spouse name: Not on file   Number of children: Not on file   Years of education: Not on file   Highest education level: Not on file  Occupational History   Not on file  Tobacco Use   Smoking status: Former  Packs/day: 1.00    Years: 55.00    Pack years: 55.00    Types: Cigarettes    Start date: 02/07/1955   Smokeless tobacco: Never   Tobacco comments:    quit 2015  Substance and Sexual Activity   Alcohol use: Not Currently    Alcohol/week: 3.0 standard drinks    Types: 3 Cans of beer per week   Drug use: No   Sexual activity: Yes    Birth control/protection: None  Other Topics Concern   Not on file  Social History Narrative   Not on file   Social Determinants of Health   Financial Resource Strain: Not on file  Food Insecurity: Not on file  Transportation Needs: Not on file  Physical Activity: Not on file  Stress: Not on file  Social Connections:  Not on file  Intimate Partner Violence: Not on file    FAMILY HISTORY:  Family History  Problem Relation Age of Onset   Heart disease Father    Heart attack Father    Alzheimer's disease Mother     CURRENT MEDICATIONS:  Current Outpatient Medications  Medication Sig Dispense Refill   aspirin EC 81 MG tablet Take 81 mg by mouth daily.     atorvastatin (LIPITOR) 10 MG tablet Take 10 mg by mouth daily at 6 PM.      [employer] B-12 5000 MCG SUBL Place 1 tablet (5,000 mcg total) under the tongue daily. 30 tablet 6   furosemide (LASIX) 20 MG tablet Take 20 mg by mouth every other day.      gabapentin (NEURONTIN) 300 MG capsule Take 2 capsules (600 mg total) by mouth 3 (three) times daily. (Patient taking differently: Take 600 mg by mouth 2 (two) times daily.) 180 capsule 2   insulin NPH (HUMULIN N,NOVOLIN N) 100 UNIT/ML injection Inject 15-30 Units into the skin See admin instructions. Per pt, 5 units every morning and 5 units in the evening     ketoconazole (NIZORAL) 2 % cream Apply topically as needed.     lisinopril (PRINIVIL,ZESTRIL) 40 MG tablet Take 40 mg by mouth daily.     naproxen (NAPROSYN) 500 MG tablet TAKE 1 TABLET BY MOUTH TWICE A DAY WITH MEALS     OXYGEN Inhale 2 L into the lungs continuous. At night time only     phenylephrine-shark liver oil-mineral oil-petrolatum (PREPARATION H) 0.25-14-74.9 % rectal ointment Place 1 application rectally as needed for hemorrhoids. (Patient not taking: Reported on 08/18/2020)     regorafenib (STIVARGA) 40 MG tablet Take 2 tablets (80 mg total) by mouth daily with breakfast. Take with low fat meal.Take for 21 days, then hold for 7 days. Repeat every 28 days. 42 tablet 3   silver sulfADIAZINE (SILVADENE) 1 % cream Apply 1 application topically as needed.     tamsulosin (FLOMAX) 0.4 MG CAPS capsule Take 1 capsule by mouth daily.     No current facility-administered medications for this visit.    ALLERGIES:  No Known Allergies  PHYSICAL EXAM:   Performance status (ECOG): 2 - Symptomatic, <50% confined to bed  There were no vitals filed for this visit. Wt Readings from Last 3 Encounters:  10/12/20 240 lb 3.2 oz (109 kg)  09/15/20 247 lb 9.6 oz (112.3 kg)  04/26/20 250 lb 6.4 oz (113.6 kg)   Physical Exam Vitals reviewed.  Constitutional:      Appearance: Normal appearance.     Comments: In wheelchair  Cardiovascular:     Rate and Rhythm:  Normal rate and regular rhythm.     Pulses: Normal pulses.     Heart sounds: Normal heart sounds.  Pulmonary:     Effort: Pulmonary effort is normal.     Breath sounds: Normal breath sounds.  Abdominal:     Palpations: Abdomen is soft. There is no hepatomegaly, splenomegaly or mass.     Tenderness: There is no abdominal tenderness.  Musculoskeletal:     Right lower leg: No edema.     Left lower leg: No edema.  Neurological:     General: No focal deficit present.     Mental Status: He is alert and oriented to person, place, and time.  Psychiatric:        Mood and Affect: Mood normal.        Behavior: Behavior normal.     LABORATORY DATA:  I have reviewed the labs as listed.  CBC Latest Ref Rng & Units 10/12/2020 09/15/2020 09/08/2020  WBC 4.0 - 10.5 K/uL 10.7(H) 8.1 8.6  Hemoglobin 13.0 - 17.0 g/dL 12.3(L) 11.7(L) 11.0(L)  Hematocrit 39.0 - 52.0 % 36.6(L) 36.8(L) 33.6(L)  Platelets 150 - 400 K/uL 209 211 222   CMP Latest Ref Rng & Units 10/12/2020 09/15/2020 09/08/2020  Glucose 70 - 99 mg/dL 166(H) 104(H) 98  BUN 8 - 23 mg/dL 37(H) 39(H) 36(H)  Creatinine 0.61 - 1.24 mg/dL 1.07 1.09 1.18  Sodium 135 - 145 mmol/L 138 138 141  Potassium 3.5 - 5.1 mmol/L 5.1 5.5(H) 5.1  Chloride 98 - 111 mmol/L 110 110 110  CO2 22 - 32 mmol/L 23 23 23   Calcium 8.9 - 10.3 mg/dL 8.4(L) 8.9 8.9  Total Protein 6.5 - 8.1 g/dL 6.8 7.1 6.9  Total Bilirubin 0.3 - 1.2 mg/dL 0.6 0.7 0.4  Alkaline Phos 38 - 126 U/L 95 91 84  AST 15 - 41 U/L 23 22 24   ALT 0 - 44 U/L 25 22 23     DIAGNOSTIC IMAGING:  I have  independently reviewed the scans and discussed with the patient. No results found.   ASSESSMENT:  1.  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 -We have discontinued Stivarga because of unhealed wounds. -His wounds in the groin and in the back has healed up nicely. -CT CAP on 11/27/2019 showed stable small right pleural effusion with chronic atelectasis.  No findings of pulmonary metastatic disease.  Stable changes of cirrhosis with no liver lesions. - CEA on 08/02/2020 increased to 7.5. - CT CAP on 08/02/2020 showed right lower lobe increased anterior lobularity extending towards the hilum. - PET scan on 09/08/2020 showed mass in the posterior medial right lung base increased in size and with SUV 18.95.  No additional abnormal FDG uptake suspicious for metastatic disease. - Stivarga 80 mg 3 weeks on/1 week off started on 09/25/2020.   2.  Chronic ulcers: -He had developed ulcers in the right upper thigh which completely healed since we stopped Stivarga.   PLAN:  1.  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 - He does not have any hand-foot skin reaction.  No diarrhea or skin rashes. - Reviewed his labs today which showed normal LFTs although albumin is low at 2.6.  CBC shows normal white count and platelet count and mild anemia. - Recommend continuing Stivarga same dose. - RTC 8 weeks for  follow-up.  Recommend repeating PET scan and CEA level prior to next visit.  2.  Mucositis: - He has gingival mucositis. - Continue Dukes mouthwash as needed.  3.  Hypokalemia: - Potassium has been high at 5.5. - He is reportedly drinking propel which has potassium in it. - Recommend discontinue propel and all other supplements with potassium.   Orders placed this encounter:  No orders of the defined types were placed in this  encounter.    Derek Jack, MD [place] [phone]   I, Thana Ates, am acting as a scribe for Dr. Derek Jack.  I, Derek Jack MD, have reviewed the above documentation for accuracy and completeness, and I agree with the above.

## 2020-11-10 ENCOUNTER — Other Ambulatory Visit: Payer: Self-pay

## 2020-11-10 ENCOUNTER — Inpatient Hospital Stay (HOSPITAL_COMMUNITY): Payer: Medicare Other

## 2020-11-10 ENCOUNTER — Inpatient Hospital Stay (HOSPITAL_COMMUNITY): Payer: Medicare Other | Attending: Hematology | Admitting: Hematology

## 2020-11-10 VITALS — BP 171/81 | HR 103 | Temp 96.7°F | Resp 20 | Wt 233.7 lb

## 2020-11-10 DIAGNOSIS — E876 Hypokalemia: Secondary | ICD-10-CM | POA: Insufficient documentation

## 2020-11-10 DIAGNOSIS — C7800 Secondary malignant neoplasm of unspecified lung: Secondary | ICD-10-CM | POA: Insufficient documentation

## 2020-11-10 DIAGNOSIS — I700 Atherosclerosis of aorta: Secondary | ICD-10-CM | POA: Diagnosis not present

## 2020-11-10 DIAGNOSIS — Z8249 Family history of ischemic heart disease and other diseases of the circulatory system: Secondary | ICD-10-CM | POA: Insufficient documentation

## 2020-11-10 DIAGNOSIS — Z9049 Acquired absence of other specified parts of digestive tract: Secondary | ICD-10-CM | POA: Insufficient documentation

## 2020-11-10 DIAGNOSIS — Z818 Family history of other mental and behavioral disorders: Secondary | ICD-10-CM | POA: Diagnosis not present

## 2020-11-10 DIAGNOSIS — M2550 Pain in unspecified joint: Secondary | ICD-10-CM | POA: Diagnosis not present

## 2020-11-10 DIAGNOSIS — K7460 Unspecified cirrhosis of liver: Secondary | ICD-10-CM | POA: Diagnosis not present

## 2020-11-10 DIAGNOSIS — C7801 Secondary malignant neoplasm of right lung: Secondary | ICD-10-CM

## 2020-11-10 DIAGNOSIS — I10 Essential (primary) hypertension: Secondary | ICD-10-CM | POA: Diagnosis not present

## 2020-11-10 DIAGNOSIS — Z79899 Other long term (current) drug therapy: Secondary | ICD-10-CM | POA: Diagnosis not present

## 2020-11-10 DIAGNOSIS — D649 Anemia, unspecified: Secondary | ICD-10-CM | POA: Diagnosis not present

## 2020-11-10 DIAGNOSIS — C19 Malignant neoplasm of rectosigmoid junction: Secondary | ICD-10-CM | POA: Diagnosis not present

## 2020-11-10 DIAGNOSIS — R0602 Shortness of breath: Secondary | ICD-10-CM | POA: Diagnosis not present

## 2020-11-10 DIAGNOSIS — Z86718 Personal history of other venous thrombosis and embolism: Secondary | ICD-10-CM | POA: Insufficient documentation

## 2020-11-10 DIAGNOSIS — R059 Cough, unspecified: Secondary | ICD-10-CM | POA: Insufficient documentation

## 2020-11-10 DIAGNOSIS — C189 Malignant neoplasm of colon, unspecified: Secondary | ICD-10-CM

## 2020-11-10 DIAGNOSIS — M791 Myalgia, unspecified site: Secondary | ICD-10-CM | POA: Insufficient documentation

## 2020-11-10 DIAGNOSIS — G479 Sleep disorder, unspecified: Secondary | ICD-10-CM | POA: Diagnosis not present

## 2020-11-10 DIAGNOSIS — J439 Emphysema, unspecified: Secondary | ICD-10-CM | POA: Insufficient documentation

## 2020-11-10 DIAGNOSIS — R5383 Other fatigue: Secondary | ICD-10-CM | POA: Diagnosis not present

## 2020-11-10 LAB — COMPREHENSIVE METABOLIC PANEL
ALT: 33 U/L (ref 0–44)
AST: 25 U/L (ref 15–41)
Albumin: 2.6 g/dL — ABNORMAL LOW (ref 3.5–5.0)
Alkaline Phosphatase: 88 U/L (ref 38–126)
Anion gap: 5 (ref 5–15)
BUN: 37 mg/dL — ABNORMAL HIGH (ref 8–23)
CO2: 22 mmol/L (ref 22–32)
Calcium: 7.7 mg/dL — ABNORMAL LOW (ref 8.9–10.3)
Chloride: 109 mmol/L (ref 98–111)
Creatinine, Ser: 1.1 mg/dL (ref 0.61–1.24)
GFR, Estimated: >60 mL/min (ref >60)
Glucose, Bld: 198 mg/dL — ABNORMAL HIGH (ref 70–99)
Potassium: 5.5 mmol/L — ABNORMAL HIGH (ref 3.5–5.1)
Sodium: 136 mmol/L (ref 135–145)
Total Bilirubin: 0.8 mg/dL (ref 0.3–1.2)
Total Protein: 6.3 g/dL — ABNORMAL LOW (ref 6.5–8.1)

## 2020-11-10 LAB — CBC WITH DIFFERENTIAL/PLATELET
Abs Immature Granulocytes: 0.04 10*3/uL (ref 0.00–0.07)
Basophils Absolute: 0.1 10*3/uL (ref 0.0–0.1)
Basophils Relative: 1 %
Eosinophils Absolute: 0.6 10*3/uL — ABNORMAL HIGH (ref 0.0–0.5)
Eosinophils Relative: 7 %
HCT: 34.1 % — ABNORMAL LOW (ref 39.0–52.0)
Hemoglobin: 11.2 g/dL — ABNORMAL LOW (ref 13.0–17.0)
Immature Granulocytes: 1 %
Lymphocytes Relative: 16 %
Lymphs Abs: 1.2 10*3/uL (ref 0.7–4.0)
MCH: 31.5 pg (ref 26.0–34.0)
MCHC: 32.8 g/dL (ref 30.0–36.0)
MCV: 96.1 fL (ref 80.0–100.0)
Monocytes Absolute: 1 10*3/uL (ref 0.1–1.0)
Monocytes Relative: 13 %
Neutro Abs: 4.8 10*3/uL (ref 1.7–7.7)
Neutrophils Relative %: 62 %
Platelets: 174 10*3/uL (ref 150–400)
RBC: 3.55 MIL/uL — ABNORMAL LOW (ref 4.22–5.81)
RDW: 14.6 % (ref 11.5–15.5)
WBC: 7.7 10*3/uL (ref 4.0–10.5)
nRBC: 0 % (ref 0.0–0.2)

## 2020-11-10 NOTE — Patient Instructions (Addendum)
[place] at [facility] Discharge Instructions  You were seen and examined today by Dr. Delton Coombes. Stop drinking Propel water due to your potassium being high. Try Mio to add to your water for flavor. Return as scheduled in 8 weeks after PET.    Thank you for choosing [place] at [facility] to provide your oncology and hematology care.  To afford each patient quality time with our provider, please arrive at least 15 minutes before your scheduled appointment time.   If you have a lab appointment with the [facility] please come in thru the Main Entrance and check in at the main information desk.  You need to re-schedule your appointment should you arrive 10 or more minutes late.  We strive to give you quality time with our providers, and arriving late affects you and other patients whose appointments are after yours.  Also, if you no show three or more times for appointments you may be dismissed from the clinic at the providers discretion.     Again, thank you for choosing [facility].  Our hope is that these requests will decrease the amount of time that you wait before being seen by our physicians.       _____________________________________________________________  Should you have questions after your visit to [facility], please contact our office at [phone] and follow the prompts.  Our office hours are 8:00 a.m. and 4:30 p.m. Monday - Friday.  Please note that voicemails left after 4:00 p.m. may not be returned until the following business day.  We are closed weekends and major holidays.  You do have access to a nurse 24-7, just call the main number to the clinic [phone] and do not press any options, hold on the line and a nurse will answer the phone.    For prescription refill requests, have your pharmacy contact our office and allow 72 hours.    Due to Covid, you will need to wear a mask upon  entering the hospital. If you do not have a mask, a mask will be given to you at the Main Entrance upon arrival. For doctor visits, patients may have 1 support person age 32 or older with them. For treatment visits, patients can not have anyone with them due to social distancing guidelines and our immunocompromised population.

## 2020-11-10 NOTE — Progress Notes (Signed)
Patient is taking Stivarga as prescribed.  He has not missed any doses and reportd no side effects at this time.

## 2020-11-11 LAB — CEA: CEA: 12.1 ng/mL — ABNORMAL HIGH (ref 0.0–4.7)

## 2020-12-15 ENCOUNTER — Other Ambulatory Visit: Payer: Self-pay

## 2020-12-15 ENCOUNTER — Inpatient Hospital Stay (HOSPITAL_COMMUNITY): Payer: Medicare Other | Attending: Hematology

## 2020-12-15 DIAGNOSIS — C7801 Secondary malignant neoplasm of right lung: Secondary | ICD-10-CM | POA: Diagnosis not present

## 2020-12-15 DIAGNOSIS — C189 Malignant neoplasm of colon, unspecified: Secondary | ICD-10-CM | POA: Diagnosis not present

## 2020-12-15 MED ORDER — HEPARIN SOD (PORK) LOCK FLUSH 100 UNIT/ML IV SOLN
500.0000 [IU] | Freq: Once | INTRAVENOUS | Status: AC
  Administered 2020-12-15: 500 [IU] via INTRAVENOUS

## 2020-12-15 MED ORDER — SODIUM CHLORIDE 0.9% FLUSH
10.0000 mL | Freq: Once | INTRAVENOUS | Status: AC
  Administered 2020-12-15: 10 mL via INTRAVENOUS

## 2020-12-15 NOTE — Progress Notes (Signed)
Patients port flushed without difficulty.  Good blood return noted with no bruising or swelling noted at site.  Band aid applied.  VSS with discharge and left in satisfactory condition with no s/s of distress noted.   Discharge from clinic via wheelchair in stable condition.  Alert and oriented X 3.  Follow up with [facility] as scheduled.

## 2020-12-15 NOTE — Patient Instructions (Signed)
[facility]  Discharge Instructions: °Thank you for choosing [facility] to provide your oncology and hematology care.  °If you have a lab appointment with the Cancer Center, please come in thru the Main Entrance and check in at the main information desk. ° °Wear comfortable clothing and clothing appropriate for easy access to any Portacath or PICC line.  ° °We strive to give you quality time with your provider. You may need to reschedule your appointment if you arrive late (15 or more minutes).  Arriving late affects you and other patients whose appointments are after yours.  Also, if you miss three or more appointments without notifying the office, you may be dismissed from the clinic at the provider’s discretion.    °  °For prescription refill requests, have your pharmacy contact our office and allow 72 hours for refills to be completed.   ° °Today you received the following chemotherapy and/or immunotherapy agents PORT flush    °  °To help prevent nausea and vomiting after your treatment, we encourage you to take your nausea medication as directed. ° °BELOW ARE SYMPTOMS THAT SHOULD BE REPORTED IMMEDIATELY: °*FEVER GREATER THAN 100.4 F (38 °C) OR HIGHER °*CHILLS OR SWEATING °*NAUSEA AND VOMITING THAT IS NOT CONTROLLED WITH YOUR NAUSEA MEDICATION °*UNUSUAL SHORTNESS OF BREATH °*UNUSUAL BRUISING OR BLEEDING °*URINARY PROBLEMS (pain or burning when urinating, or frequent urination) °*BOWEL PROBLEMS (unusual diarrhea, constipation, pain near the anus) °TENDERNESS IN MOUTH AND THROAT WITH OR WITHOUT PRESENCE OF ULCERS (sore throat, sores in mouth, or a toothache) °UNUSUAL RASH, SWELLING OR PAIN  °UNUSUAL VAGINAL DISCHARGE OR ITCHING  ° °Items with * indicate a potential emergency and should be followed up as soon as possible or go to the Emergency Department if any problems should occur. ° °Please show the CHEMOTHERAPY ALERT CARD or IMMUNOTHERAPY ALERT CARD at check-in to the Emergency  Department and triage nurse. ° °Should you have questions after your visit or need to cancel or reschedule your appointment, please contact [facility] [phone]  and follow the prompts.  Office hours are 8:00 a.m. to 4:30 p.m. Monday - Friday. Please note that voicemails left after 4:00 p.m. may not be returned until the following business day.  We are closed weekends and major holidays. You have access to a nurse at all times for urgent questions. Please call the main number to the clinic [phone] and follow the prompts. ° °For any non-urgent questions, you may also contact your provider using MyChart. We now offer e-Visits for anyone 18 and older to request care online for non-urgent symptoms. For details visit [email or web address]. °  °Also download the MyChart app! Go to the app store, search "MyChart", open the app, select [facility], and log in with your MyChart username and password. ° °Due to Covid, a mask is required upon entering the hospital/clinic. If you do not have a mask, one will be given to you upon arrival. For doctor visits, patients may have 1 support person aged 18 or older with them. For treatment visits, patients cannot have anyone with them due to current Covid guidelines and our immunocompromised population.  °

## 2021-01-03 ENCOUNTER — Other Ambulatory Visit (HOSPITAL_COMMUNITY): Payer: Self-pay

## 2021-01-03 MED ORDER — DIPHENOXYLATE-ATROPINE 2.5-0.025 MG PO TABS: 1.0000 | ORAL_TABLET | Freq: Four times a day (QID) | ORAL | 0 refills | Status: AC | PRN

## 2021-01-06 ENCOUNTER — Other Ambulatory Visit: Payer: Self-pay

## 2021-01-06 ENCOUNTER — Other Ambulatory Visit (HOSPITAL_COMMUNITY): Payer: Medicare Other

## 2021-01-06 ENCOUNTER — Ambulatory Visit (HOSPITAL_COMMUNITY)
Admission: RE | Admit: 2021-01-06 | Discharge: 2021-01-06 | Disposition: A | Discharge status: Home / Self Care | Payer: Medicare Other | Source: Ambulatory Visit | Attending: Hematology | Admitting: Hematology

## 2021-01-06 DIAGNOSIS — C7801 Secondary malignant neoplasm of right lung: Secondary | ICD-10-CM | POA: Insufficient documentation

## 2021-01-06 LAB — GLUCOSE, CAPILLARY
Glucose-Capillary: 54 mg/dL — ABNORMAL LOW (ref 70–99)
Glucose-Capillary: 61 mg/dL — ABNORMAL LOW (ref 70–99)
Glucose-Capillary: 54 mg/dL — ABNORMAL LOW (ref 70–99)

## 2021-01-06 MED ORDER — FLUDEOXYGLUCOSE F - 18 (FDG) INJECTION
12.3000 | Freq: Once | INTRAVENOUS | Status: AC | PRN
  Administered 2021-01-06: 12.3 via INTRAVENOUS

## 2021-01-12 NOTE — Progress Notes (Signed)
[facility] [place] [facility], [place] [id number]   CLINIC:  Medical Oncology/Hematology  PCP:  Ledell Noss, [place] / [place] [address] [phone]   REASON FOR VISIT:  Follow-up for metastatic colon cancer  PRIOR THERAPY: none  NGS Results: not done  CURRENT THERAPY: Stivarga 80 mg daily started on 09/25/2020  BRIEF ONCOLOGIC HISTORY:  Oncology History  Adenocarcinoma of colon ([place])  10/04/2012 Definitive Surgery   Partial collectomy by Dr. [name]   10/04/2012 Pathology Results   4.5 cm poorly differentiated adenocarcinoma, negative margins, 0/7 lymph nodes for metastatic disease.  Oncotype recurrence score of 26 (high)    Chemotherapy   FOLFOX    Adverse Reaction   Progressive peripheral neuropathy    Chemotherapy   Infusional 5 FU.  Completed 6 months worth of adjuvant therapy.   02/23/2014 Procedure   Colonoscopy by Dr. Anthony Sar.   07/01/2015 PET scan   There is malignant range uptake with RLL pulmonary nodule suspicious for metastatic disease or primary pulm neoplasm. Nonspecific focus of uptake in the L femoral neck. No corresponding CT abnormality. Cannot rule out metastasis.   07/06/2015 Imaging   MR C-spine- C5-6 there is a broad-based disc bulge w R paracentral disc protrusion deforming the spinal cord. Moderate R foraminal stenosis. C6-7 there is a central disc protrusion slightly eccentric towards the R and mildly deforming the ventral c-spine    09/16/2015 Procedure   RLL needle biopsy by IR.   09/17/2015 Pathology Results   Metastatic adenocarcinoma consistent with a primary colorectal adenocarcinoma.   10/12/2015 - 10/22/2015 Radiation Therapy   SBRT by Dr. [name] to RLL pulmonary lesion (biopsy proven to be oligometastasis).   12/09/2015 Imaging   CT chest- 1. Slight interval decrease in size of the right lower lobe pulmonary nodule. Some surrounding radiation changes. 2. No mediastinal or hilar mass or  adenopathy. 3. No new pulmonary nodules. 4. Stable underline emphysematous changes. 5. Cirrhotic changes involving the liver but no upper abdominal metastatic disease.   03/14/2016 Imaging   CT chest- 1. Continued mild reduction in the size of the treated right lobe pulmonary nodule. 2. Increased patchy consolidation, reticulation and ground-glass attenuation in the basilar right lower lobe, nonspecific, favor evolving postradiation change, recommend attention on follow-up chest CT. 3. No evidence of new or progressive metastatic disease in the chest. 4. Aortic atherosclerosis. Left main and 3 vessel coronary atherosclerosis. 5. Mild emphysema.   06/30/2016 Imaging   CT CAP- 1. The appearance of the treated right lower lobe nodule is essentially unchanged, and there are evolving postradiation changes in the base of the right lower lobe, as above. No definite signs of new metastatic disease are noted elsewhere in the chest, abdomen or pelvis. 2. Aortic atherosclerosis, in addition to left main and 3 vessel coronary artery disease. Assessment for potential risk factor modification, dietary therapy or pharmacologic therapy may be warranted, if clinically indicated. 3. Morphologic changes in the liver suggestive of cirrhosis, as above. 4. Additional incidental findings, similar prior studies, as above.   11/17/2016 Imaging   CT CAP Study Result   CLINICAL DATA:  Stage IV colon cancer originally diagnosed in August 2014 status post partial colectomy, with right lower lobe lung metastasis diagnosed August 2017 status post SBRT completed 10/22/2015. Restaging.   EXAM: CT CHEST, ABDOMEN, AND PELVIS WITH CONTRAST   TECHNIQUE: Multidetector CT imaging of the chest, abdomen and pelvis was performed following the standard protocol during bolus  administration of intravenous contrast.   CONTRAST:  136mL ISOVUE-300 IOPAMIDOL (ISOVUE-300) INJECTION 61%   COMPARISON:  06/30/2016 CT  chest, abdomen and pelvis.   FINDINGS: CT CHEST FINDINGS   Cardiovascular: Normal heart size. No significant pericardial fluid/thickening. Left main, left anterior descending, left circumflex and right coronary atherosclerosis. Atherosclerotic nonaneurysmal thoracic aorta. Normal caliber pulmonary arteries. No central pulmonary emboli. Right internal jugular MediPort terminates at the junction of the right and left brachiocephalic veins.   Mediastinum/Nodes: No discrete thyroid nodules. Unremarkable esophagus. No pathologically enlarged axillary, mediastinal or hilar lymph nodes.   Lungs/Pleura: No pneumothorax. No pleural effusion. There is a nodular 2.7 x 1.6 cm focus of consolidation in the medial basilar right lower lobe (series 3/ image 108), previously 1.6 x 1.1 cm. No appreciable change in patchy subpleural reticulation in both lungs without significant traction bronchiectasis or frank honeycombing. Mild centrilobular and paraseptal emphysema with mild diffuse bronchial wall thickening. No acute consolidative airspace disease or new significant pulmonary nodules.   Musculoskeletal: No aggressive appearing focal osseous lesions. Moderate thoracic spondylosis. Stable chronic left scapular deformity.   CT ABDOMEN PELVIS FINDINGS   Hepatobiliary: Normal liver with no liver mass. Cholecystectomy. No biliary ductal dilatation.   Pancreas: Normal, with no mass or duct dilation.   Spleen: Normal size. No mass.   Adrenals/Urinary Tract: Normal adrenals. No hydronephrosis. No renal masses. Normal bladder.   Stomach/Bowel: Grossly normal stomach. Normal caliber small bowel with no small bowel wall thickening. Stable appearance of the appendix, within normal limits. Stable postsurgical changes from partial distal colectomy with intact appearing distal colonic anastomosis. Oral contrast transits to the cecum. No large bowel wall thickening or new pericholecystic fat stranding  .   Vascular/Lymphatic: Atherosclerotic nonaneurysmal abdominal aorta. Patent portal, splenic, hepatic and renal veins. No pathologically enlarged lymph nodes in the abdomen or pelvis.   Reproductive:  Normal size prostate.   Other: No pneumoperitoneum, ascites or focal fluid collection. Stable subcutaneous calcified granulomas throughout the left lower back and gluteal regions.   Musculoskeletal: No aggressive appearing focal osseous lesions. Marked lumbar spondylosis.   IMPRESSION: 1. Nodular focus of consolidation in the medial basilar right lower lobe is increased in size. While this may represent evolving masslike radiation fibrosis, recurrence of the lung metastasis is not excluded. PET-CT would be useful for further evaluation. Otherwise, continued close chest CT surveillance is advised . 2. No additional potential new sites of metastatic disease in the chest, abdomen or pelvis. 3. No evidence of local tumor recurrence at the distal colonic anastomosis. 4. Left main and 3 vessel coronary atherosclerosis . 5. Aortic Atherosclerosis (ICD10-I70.0) and Emphysema (ICD10-J43.9).      11/30/2016 Relapse/Recurrence   PET: IMPRESSION: 1. Enlarging right lower lobe pulmonary nodule and slight increase in the SUV max suggesting residual neoplasm. No new pulmonary lesions. 2. No findings for local recurrence or abdominal/pelvic metastatic disease.   01/02/2017 - 01/12/2017 Radiation Therapy    The patient saw Dr. [name] for SBRT radiation treatment. The tumor in the right lung was treated with a course of stereotactic body radiation treatment. The patient received 50 Gy In 5 fractions at 10 Gy per fraction.      10/05/2017 - 10/05/2017 Chemotherapy   The patient had bevacizumab (AVASTIN) 900 mg in sodium chloride 0.9 % 100 mL chemo infusion, 7.3 mg/kg = 925 mg, Intravenous,  Once, 1 of 3 cycles Administration: 900 mg (10/05/2017)   for chemotherapy treatment.       CANCER  STAGING:  Cancer  Staging  Adenocarcinoma of colon [facility]) Staging form: Colon and Rectum, AJCC 7th Edition - Clinical stage from 10/04/2012: Stage IIA (T3, N0, M0) - Signed by Baird Cancer, PA-C on 07/15/2015 - Pathologic stage from 09/17/2015: Stage IVA (T3, N0, M1a) - Signed by Baird Cancer, PA-C on 10/05/2015   INTERVAL HISTORY:  Gary Nelson, a 80 y.o. male, returns for routine follow-up of his metastatic colon cancer. Gary Nelson was last seen on 11/10/2020.   Today he reports feeling well. He has stopped drinking Propel. He reports he is currently drinking mountain dew and water. He reports cough. He is currently taking Lasix.   REVIEW OF SYSTEMS:  Review of Systems  Constitutional:  Positive for fatigue (depleted). Negative for appetite change.  HENT:   Positive for trouble swallowing.   Respiratory:  Positive for cough and shortness of breath.   Gastrointestinal:  Positive for diarrhea.  Genitourinary:  Positive for bladder incontinence.   Musculoskeletal:  Positive for arthralgias (7/10 hips) and myalgias (muscle spasms).  Neurological:  Positive for numbness.  All other systems reviewed and are negative.  PAST MEDICAL/SURGICAL HISTORY:  Past Medical History:  Diagnosis Date   Adenocarcinoma of colon ([place])    RLL -  Pulmonary nodule (06/2015, concerning for mets vs primary; to see RAD-ONC Dr. 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   Cystitis    Diabetes mellitus without complication ([place])    Type II   DVT (deep venous thrombosis) ([place]) 2014   post op   Hematuria    Hypertension    Iron deficiency 07/31/2019   Neuropathy    Portal hypertension ([place]) 07/28/2019   Prostatitis    Pulmonary nodule    Sleep apnea    Past Surgical History:  Procedure Laterality Date   BACK SURGERY     CATARACT EXTRACTION     left   CATARACT EXTRACTION W/PHACO Right 04/11/2012   Procedure:  CATARACT EXTRACTION PHACO AND INTRAOCULAR LENS PLACEMENT ([place]);  Surgeon: Tonny Branch, MD;  Location: [facility] ORS;  Service: Ophthalmology;  Laterality: Right;  CDE:62.48   CERVICAL FUSION     c4-c5   CHOLECYSTECTOMY     COLON SURGERY  09/29/12   Colon resection for cancer   COLONOSCOPY     EYE SURGERY     cataract   GIVENS CAPSULE STUDY N/A 03/15/2015   Procedure: GIVENS CAPSULE STUDY;  Surgeon: Rogene Houston, MD;  Location: [facility] ENDO SUITE;  Service: Endoscopy;  Laterality: N/A;  730   LEG SURGERY     ORIF FEMUR FRACTURE     right   POSTERIOR CERVICAL FUSION/FORAMINOTOMY N/A 07/27/2015   Procedure: Cervical four to Cervical seven Laminectomy with Cervical four to Thoracic one Dorsal internal fixation and fusion;  Surgeon: Kevan Ny Ditty, MD;  Location: [place] OR[facility];  Service: Neurosurgery;  Laterality: N/A;  C4 to C7 Laminectomy with C4 to T1 Dorsal internal fixation and fusion    SOCIAL HISTORY:  Social History   Socioeconomic History   Marital status: Single    Spouse name: Not on file   Number of children: Not on file   Years of education: Not on file   Highest education level: Not on file  Occupational History   Not on file  Tobacco Use   Smoking status: Former    Packs/day: 1.00    Years: 55.00  Pack years: 55.00    Types: Cigarettes    Start date: 02/07/1955   Smokeless tobacco: Never   Tobacco comments:    quit 2015  Substance and Sexual Activity   Alcohol use: Not Currently    Alcohol/week: 3.0 standard drinks    Types: 3 Cans of beer per week   Drug use: No   Sexual activity: Yes    Birth control/protection: None  Other Topics Concern   Not on file  Social History Narrative   Not on file   Social Determinants of Health   Financial Resource Strain: Not on file  Food Insecurity: Not on file  Transportation Needs: Not on file  Physical Activity: Not on file  Stress: Not on file  Social Connections: Not on file  Intimate Partner Violence: Not on file     FAMILY HISTORY:  Family History  Problem Relation Age of Onset   Heart disease Father    Heart attack Father    Alzheimer's disease Mother     CURRENT MEDICATIONS:  Current Outpatient Medications  Medication Sig Dispense Refill   aspirin EC 81 MG tablet Take 81 mg by mouth daily.     [employer] B-12 5000 MCG SUBL Place 1 tablet (5,000 mcg total) under the tongue daily. 30 tablet 6   furosemide (LASIX) 20 MG tablet Take 20 mg by mouth every other day.      gabapentin (NEURONTIN) 300 MG capsule Take 2 capsules (600 mg total) by mouth 3 (three) times daily. (Patient taking differently: Take 600 mg by mouth 2 (two) times daily.) 180 capsule 2   insulin NPH (HUMULIN N,NOVOLIN N) 100 UNIT/ML injection Inject 15-30 Units into the skin See admin instructions. Per pt, 5 units every morning and 5 units in the evening     ketoconazole (NIZORAL) 2 % cream Apply topically as needed.     lisinopril (PRINIVIL,ZESTRIL) 40 MG tablet Take 40 mg by mouth daily.     naproxen (NAPROSYN) 500 MG tablet TAKE 1 TABLET BY MOUTH TWICE A DAY WITH MEALS     OXYGEN Inhale 2 L into the lungs continuous. At night time only     phenylephrine-shark liver oil-mineral oil-petrolatum (PREPARATION H) 0.25-14-74.9 % rectal ointment Place 1 application rectally as needed for hemorrhoids.     regorafenib (STIVARGA) 40 MG tablet Take 2 tablets (80 mg total) by mouth daily with breakfast. Take with low fat meal.Take for 21 days, then hold for 7 days. Repeat every 28 days. 42 tablet 3   silver sulfADIAZINE (SILVADENE) 1 % cream Apply 1 application topically as needed.     sucralfate (CARAFATE) 1 GM/10ML suspension SWISH AND SWALLOW 15ML FOUR TIMES DAILY     tamsulosin (FLOMAX) 0.4 MG CAPS capsule Take 1 capsule by mouth daily.     atorvastatin (LIPITOR) 10 MG tablet Take 10 mg by mouth daily at 6 PM.      diphenoxylate-atropine (LOMOTIL) 2.5-0.025 MG tablet Take 1 tablet by mouth 4 (four) times daily as needed for diarrhea or loose  stools. (Patient not taking: Reported on 01/13/2021) 60 tablet 0   No current facility-administered medications for this visit.    ALLERGIES:  No Known Allergies  PHYSICAL EXAM:  Performance status (ECOG): 2 - Symptomatic, <50% confined to bed  Vitals:   01/13/21 1216  BP: (!) 151/52  Pulse: 99  Resp: 20  Temp: 97.7 F (36.5 C)  SpO2: 98%   Wt Readings from Last 3 Encounters:  01/13/21 242 lb 15.2 oz (  110.2 kg)  01/06/21 248 lb (112.5 kg)  11/10/20 233 lb 11 oz (106 kg)   Physical Exam Vitals reviewed.  Constitutional:      Appearance: Normal appearance. He is obese.     Comments: In wheelchair  Cardiovascular:     Rate and Rhythm: Normal rate and regular rhythm.     Pulses: Normal pulses.     Heart sounds: Normal heart sounds.  Pulmonary:     Effort: Pulmonary effort is normal.     Breath sounds: Normal breath sounds.  Neurological:     General: No focal deficit present.     Mental Status: He is alert and oriented to person, place, and time.  Psychiatric:        Mood and Affect: Mood normal.        Behavior: Behavior normal.     LABORATORY DATA:  I have reviewed the labs as listed.  CBC Latest Ref Rng & Units 01/13/2021 11/10/2020 10/12/2020  WBC 4.0 - 10.5 K/uL 9.6 7.7 10.7(H)  Hemoglobin 13.0 - 17.0 g/dL 10.0(L) 11.2(L) 12.3(L)  Hematocrit 39.0 - 52.0 % 31.9(L) 34.1(L) 36.6(L)  Platelets 150 - 400 K/uL 210 174 209   CMP Latest Ref Rng & Units 01/13/2021 11/10/2020 10/12/2020  Glucose 70 - 99 mg/dL 112(H) 198(H) 166(H)  BUN 8 - 23 mg/dL 36(H) 37(H) 37(H)  Creatinine 0.61 - 1.24 mg/dL 1.22 1.10 1.07  Sodium 135 - 145 mmol/L 137 136 138  Potassium 3.5 - 5.1 mmol/L 5.5(H) 5.5(H) 5.1  Chloride 98 - 111 mmol/L 110 109 110  CO2 22 - 32 mmol/L 22 22 23   Calcium 8.9 - 10.3 mg/dL 8.1(L) 7.7(L) 8.4(L)  Total Protein 6.5 - 8.1 g/dL 6.1(L) 6.3(L) 6.8  Total Bilirubin 0.3 - 1.2 mg/dL 0.4 0.8 0.6  Alkaline Phos 38 - 126 U/L 63 88 95  AST 15 - 41 U/L 16 25 23   ALT 0 - 44  U/L 15 33 25    DIAGNOSTIC IMAGING:  I have independently reviewed the scans and discussed with the patient. NM PET Image Initial (PI) Skull Base To Thigh  Result Date: 01/07/2021 CLINICAL DATA:  Subsequent treatment strategy for colorectal cancer with lung mets. EXAM: NUCLEAR MEDICINE PET SKULL BASE TO THIGH TECHNIQUE: 12.3 mCi F-18 FDG was injected intravenously. Full-ring PET imaging was performed from the skull base to thigh after the radiotracer. CT data was obtained and used for attenuation correction and anatomic localization. Fasting blood glucose: 61 mg/dl COMPARISON:  09/08/2020 FINDINGS: Mediastinal blood pool activity: SUV max 2.5 Liver activity: SUV max NA NECK: No hypermetabolic lymph nodes in the neck. Incidental CT findings: none CHEST: Irregular mass in the medial right lung base has increased slightly in size in the interval measuring 4.6 cm today compared to 4.0 cm previously. Lesion remains hypermetabolic with SUV max = 78.4, similar to previous at 18.95. No other hypermetabolic pulmonary nodule or mass. No hypermetabolic lymphadenopathy in the chest. Incidental CT findings: Right Port-A-Cath tip is positioned at the innominate vein confluence. Mild atherosclerotic calcification is noted in the wall of the thoracic aorta. Coronary artery calcification is evident. Small right pleural effusion again noted ABDOMEN/PELVIS: No abnormal hypermetabolic activity within the liver, pancreas, adrenal glands, or spleen. No hypermetabolic lymph nodes in the abdomen or pelvis. Incidental CT findings: Gallbladder surgically absent. There is moderate atherosclerotic calcification of the abdominal aorta without aneurysm. Gas is identified in the bladder lumen. SKELETON: Somewhat mottled marrow uptake without definite hypermetabolic osseous metastatic lesion Incidental CT findings:  No worrisome lytic or sclerotic osseous abnormality. Posttraumatic deformity noted proximal right femur. IMPRESSION: 1. Slight  interval increase in size of the right lower lobe pulmonary mass which remains markedly hypermetabolic, consistent with metabolically active tumor. 2. No new sites of hypermetabolism in the neck, chest, abdomen, or pelvis on today's study. 3. Small right pleural effusion. 4.  Aortic Atherosclerois (ICD10-170.0) Electronically Signed   By: Misty Stanley M.D.   On: 01/07/2021 10:38     ASSESSMENT:  1.  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 -We have discontinued Stivarga because of unhealed wounds. -His wounds in the groin and in the back has healed up nicely. -CT CAP on 11/27/2019 showed stable small right pleural effusion with chronic atelectasis.  No findings of pulmonary metastatic disease.  Stable changes of cirrhosis with no liver lesions. - CEA on 08/02/2020 increased to 7.5. - CT CAP on 08/02/2020 showed right lower lobe increased anterior lobularity extending towards the hilum. - PET scan on 09/08/2020 showed mass in the posterior medial right lung base increased in size and with SUV 18.95.  No additional abnormal FDG uptake suspicious for metastatic disease. - Stivarga 80 mg 3 weeks on/1 week off started on 09/25/2020.   2.  Chronic ulcers: -He had developed ulcers in the right upper thigh which completely healed since we stopped Stivarga.   PLAN:  1.  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 - Reviewed labs from today which showed CEA of 8.8, down from 12.1 previously. - Reviewed PET scan from 01/06/2021 which showed slight interval increase in size of the right lower lobe pulmonary mass which remains markedly hypermetabolic.  No new sites of hypermetabolism in the neck, chest, abdomen or pelvis.  Small right pleural effusion. - Recommend discontinuing Stivarga. - We will make a referral to Dr. [name] to see if the right lower lobe nodule can be  radiated. - RTC 6 weeks with labs on same day.  2.  Mucositis: - Use Dukes mouthwash as needed.  3.  Hypokalemia: - Potassium is high today. - He will receive Kayexalate.  We discussed to avoid potassium rich foods.   Orders placed this encounter:  No orders of the defined types were placed in this encounter.    Derek Jack, MD [place] [phone]   I, Thana Ates, am acting as a scribe for Dr. Derek Jack.  I, Derek Jack MD, have reviewed the above documentation for accuracy and completeness, and I agree with the above.

## 2021-01-13 ENCOUNTER — Other Ambulatory Visit: Payer: Self-pay

## 2021-01-13 ENCOUNTER — Inpatient Hospital Stay (HOSPITAL_COMMUNITY): Payer: Medicare Other

## 2021-01-13 ENCOUNTER — Inpatient Hospital Stay (HOSPITAL_COMMUNITY): Payer: Medicare Other | Attending: Hematology | Admitting: Hematology

## 2021-01-13 VITALS — BP 151/52 | HR 99 | Temp 97.7°F | Resp 20 | Ht 61.81 in | Wt 242.9 lb

## 2021-01-13 DIAGNOSIS — M2550 Pain in unspecified joint: Secondary | ICD-10-CM | POA: Diagnosis not present

## 2021-01-13 DIAGNOSIS — M791 Myalgia, unspecified site: Secondary | ICD-10-CM | POA: Diagnosis not present

## 2021-01-13 DIAGNOSIS — J90 Pleural effusion, not elsewhere classified: Secondary | ICD-10-CM | POA: Insufficient documentation

## 2021-01-13 DIAGNOSIS — Z86718 Personal history of other venous thrombosis and embolism: Secondary | ICD-10-CM | POA: Insufficient documentation

## 2021-01-13 DIAGNOSIS — R5383 Other fatigue: Secondary | ICD-10-CM | POA: Insufficient documentation

## 2021-01-13 DIAGNOSIS — Z818 Family history of other mental and behavioral disorders: Secondary | ICD-10-CM | POA: Insufficient documentation

## 2021-01-13 DIAGNOSIS — J439 Emphysema, unspecified: Secondary | ICD-10-CM | POA: Diagnosis not present

## 2021-01-13 DIAGNOSIS — K7460 Unspecified cirrhosis of liver: Secondary | ICD-10-CM | POA: Diagnosis not present

## 2021-01-13 DIAGNOSIS — Z9049 Acquired absence of other specified parts of digestive tract: Secondary | ICD-10-CM | POA: Diagnosis not present

## 2021-01-13 DIAGNOSIS — E876 Hypokalemia: Secondary | ICD-10-CM | POA: Diagnosis not present

## 2021-01-13 DIAGNOSIS — C19 Malignant neoplasm of rectosigmoid junction: Secondary | ICD-10-CM | POA: Diagnosis not present

## 2021-01-13 DIAGNOSIS — Z8249 Family history of ischemic heart disease and other diseases of the circulatory system: Secondary | ICD-10-CM | POA: Insufficient documentation

## 2021-01-13 DIAGNOSIS — C7801 Secondary malignant neoplasm of right lung: Secondary | ICD-10-CM

## 2021-01-13 DIAGNOSIS — Z87891 Personal history of nicotine dependence: Secondary | ICD-10-CM | POA: Diagnosis not present

## 2021-01-13 DIAGNOSIS — I10 Essential (primary) hypertension: Secondary | ICD-10-CM | POA: Insufficient documentation

## 2021-01-13 DIAGNOSIS — C7800 Secondary malignant neoplasm of unspecified lung: Secondary | ICD-10-CM | POA: Insufficient documentation

## 2021-01-13 DIAGNOSIS — R32 Unspecified urinary incontinence: Secondary | ICD-10-CM | POA: Diagnosis not present

## 2021-01-13 DIAGNOSIS — K1230 Oral mucositis (ulcerative), unspecified: Secondary | ICD-10-CM | POA: Insufficient documentation

## 2021-01-13 DIAGNOSIS — R0602 Shortness of breath: Secondary | ICD-10-CM | POA: Diagnosis not present

## 2021-01-13 DIAGNOSIS — R197 Diarrhea, unspecified: Secondary | ICD-10-CM | POA: Diagnosis not present

## 2021-01-13 DIAGNOSIS — I700 Atherosclerosis of aorta: Secondary | ICD-10-CM | POA: Diagnosis not present

## 2021-01-13 DIAGNOSIS — Z79899 Other long term (current) drug therapy: Secondary | ICD-10-CM | POA: Diagnosis not present

## 2021-01-13 DIAGNOSIS — R200 Anesthesia of skin: Secondary | ICD-10-CM | POA: Diagnosis not present

## 2021-01-13 LAB — COMPREHENSIVE METABOLIC PANEL
ALT: 15 U/L (ref 0–44)
AST: 16 U/L (ref 15–41)
Albumin: 2.8 g/dL — ABNORMAL LOW (ref 3.5–5.0)
Alkaline Phosphatase: 63 U/L (ref 38–126)
Anion gap: 5 (ref 5–15)
BUN: 36 mg/dL — ABNORMAL HIGH (ref 8–23)
CO2: 22 mmol/L (ref 22–32)
Calcium: 8.1 mg/dL — ABNORMAL LOW (ref 8.9–10.3)
Chloride: 110 mmol/L (ref 98–111)
Creatinine, Ser: 1.22 mg/dL (ref 0.61–1.24)
GFR, Estimated: >60 mL/min (ref >60)
Glucose, Bld: 112 mg/dL — ABNORMAL HIGH (ref 70–99)
Potassium: 5.5 mmol/L — ABNORMAL HIGH (ref 3.5–5.1)
Sodium: 137 mmol/L (ref 135–145)
Total Bilirubin: 0.4 mg/dL (ref 0.3–1.2)
Total Protein: 6.1 g/dL — ABNORMAL LOW (ref 6.5–8.1)

## 2021-01-13 LAB — CBC WITH DIFFERENTIAL/PLATELET
Abs Immature Granulocytes: 0.05 10*3/uL (ref 0.00–0.07)
Basophils Absolute: 0.1 10*3/uL (ref 0.0–0.1)
Basophils Relative: 1 %
Eosinophils Absolute: 0.4 10*3/uL (ref 0.0–0.5)
Eosinophils Relative: 4 %
HCT: 31.9 % — ABNORMAL LOW (ref 39.0–52.0)
Hemoglobin: 10 g/dL — ABNORMAL LOW (ref 13.0–17.0)
Immature Granulocytes: 1 %
Lymphocytes Relative: 11 %
Lymphs Abs: 1 10*3/uL (ref 0.7–4.0)
MCH: 31 pg (ref 26.0–34.0)
MCHC: 31.3 g/dL (ref 30.0–36.0)
MCV: 98.8 fL (ref 80.0–100.0)
Monocytes Absolute: 0.8 10*3/uL (ref 0.1–1.0)
Monocytes Relative: 9 %
Neutro Abs: 7.3 10*3/uL (ref 1.7–7.7)
Neutrophils Relative %: 74 %
Platelets: 210 10*3/uL (ref 150–400)
RBC: 3.23 MIL/uL — ABNORMAL LOW (ref 4.22–5.81)
RDW: 15.5 % (ref 11.5–15.5)
WBC: 9.6 10*3/uL (ref 4.0–10.5)
nRBC: 0 % (ref 0.0–0.2)

## 2021-01-13 LAB — MAGNESIUM: Magnesium: 1.9 mg/dL (ref 1.7–2.4)

## 2021-01-13 MED ORDER — SODIUM POLYSTYRENE SULFONATE 15 GM/60ML PO SUSP
60.0000 g | Freq: Once | ORAL | Status: DC
  Filled 2021-01-13: qty 240

## 2021-01-13 NOTE — Patient Instructions (Addendum)
[place] at [facility] Discharge Instructions   You were seen and examined today by Dr. Delton Coombes.  He reviewed the results of your PET scan - the cancer in the right lower part of your lung has gotten bigger.  We will refer you to radiation oncology to have this spot treated. You need to stop taking Stivarga, as this cancer has grown.   Your potassium is elevated today - we will send you with Kayexalate to drink at home.  Remain close to the commode after drinking.    Thank you for choosing [place] at [facility] to provide your oncology and hematology care.  To afford each patient quality time with our provider, please arrive at least 15 minutes before your scheduled appointment time.   If you have a lab appointment with the [place] please come in thru the Main Entrance and check in at the main information desk.  You need to re-schedule your appointment should you arrive 10 or more minutes late.  We strive to give you quality time with our providers, and arriving late affects you and other patients whose appointments are after yours.  Also, if you no show three or more times for appointments you may be dismissed from the clinic at the providers discretion.     Again, thank you for choosing [facility].  Our hope is that these requests will decrease the amount of time that you wait before being seen by our physicians.       _____________________________________________________________  Should you have questions after your visit to [facility], please contact our office at [phone] and follow the prompts.  Our office hours are 8:00 a.m. and 4:30 p.m. Monday - Friday.  Please note that voicemails left after 4:00 p.m. may not be returned until the following business day.  We are closed weekends and major holidays.  You do have access to a nurse 24-7, just call the main number to the clinic [phone] and do  not press any options, hold on the line and a nurse will answer the phone.    For prescription refill requests, have your pharmacy contact our office and allow 72 hours.    Due to Covid, you will need to wear a mask upon entering the hospital. If you do not have a mask, a mask will be given to you at the Main Entrance upon arrival. For doctor visits, patients may have 1 support person age 28 or older with them. For treatment visits, patients can not have anyone with them due to social distancing guidelines and our immunocompromised population.

## 2021-01-14 LAB — CEA: CEA: 8.6 ng/mL — ABNORMAL HIGH (ref 0.0–4.7)

## 2021-01-24 NOTE — Progress Notes (Signed)
Thoracic Location of Tumor / Histology: Right Lower Lobe Lung  Patient presented for follow-up of metastatic colon cancer.  PET 01/06/2021: Irregular mass in the medial right lung base has increased slightly in size in the interval measuring 4.6 cm today compared to 4.0 cm previously. Lesion remains hypermetabolic with SUV max = 16.1, similar to previous at 18.95.  PET 09/08/2020: Again seen is a 4 cm mass within the posteromedial right lung base which has gradually increased in size since 01/15/2018 and is concerning for metabolically active tumor.  No additional abnormal areas of FDG uptake suspicious for metastatic disease.  CT CAP 08/02/2020: right lower lobe increased anterior lobularity extending towards the hilum.  Biopsies of   Tobacco/Marijuana/Snuff/ETOH use: Non smoker  Past/Anticipated interventions by cardiothoracic surgery, if any:   Past/Anticipated interventions by medical oncology, if any:  Dr. Delton Coombes 01/13/2021 -1.  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 - He does not have any hand-foot skin reaction.  No diarrhea or skin rashes. - Reviewed his labs today which showed normal LFTs although albumin is low at 2.6.  CBC shows normal white count and platelet count and mild anemia. - Recommend continuing Stivarga same dose. - RTC 8 weeks for follow-up.  Recommend repeating PET scan and CEA level prior to next visit.   Signs/Symptoms Weight changes, if any:  Respiratory complaints, if any: Notes SOB, worse with increased activities. Hemoptysis, if any: Has occasional productive cough, denies hemoptysis. Pain issues, if any: No   SAFETY ISSUES: Prior radiation? SBRT RLL 9/5-9/15/2017, 11/27-12/08/2016 with Dr. [name] Pacemaker/ICD? No  Possible current pregnancy? N/a Is the patient on methotrexate? No  Current Complaints / other details:

## 2021-01-25 ENCOUNTER — Encounter: Payer: Self-pay | Admitting: Radiation Oncology

## 2021-01-25 ENCOUNTER — Other Ambulatory Visit: Payer: Self-pay

## 2021-01-25 ENCOUNTER — Ambulatory Visit
Admission: RE | Admit: 2021-01-25 | Discharge: 2021-01-25 | Disposition: A | Discharge status: Home / Self Care | Payer: Medicare Other | Source: Ambulatory Visit | Attending: Radiation Oncology | Admitting: Radiation Oncology

## 2021-01-25 VITALS — BP 157/60 | HR 68 | Temp 96.8°F | Resp 18 | Ht 62.0 in

## 2021-01-25 DIAGNOSIS — J90 Pleural effusion, not elsewhere classified: Secondary | ICD-10-CM | POA: Diagnosis not present

## 2021-01-25 DIAGNOSIS — C189 Malignant neoplasm of colon, unspecified: Secondary | ICD-10-CM | POA: Diagnosis not present

## 2021-01-25 DIAGNOSIS — E1136 Type 2 diabetes mellitus with diabetic cataract: Secondary | ICD-10-CM | POA: Diagnosis not present

## 2021-01-25 DIAGNOSIS — K7460 Unspecified cirrhosis of liver: Secondary | ICD-10-CM | POA: Diagnosis not present

## 2021-01-25 DIAGNOSIS — G4730 Sleep apnea, unspecified: Secondary | ICD-10-CM | POA: Diagnosis not present

## 2021-01-25 DIAGNOSIS — Z923 Personal history of irradiation: Secondary | ICD-10-CM | POA: Insufficient documentation

## 2021-01-25 DIAGNOSIS — Z7982 Long term (current) use of aspirin: Secondary | ICD-10-CM | POA: Diagnosis not present

## 2021-01-25 DIAGNOSIS — I10 Essential (primary) hypertension: Secondary | ICD-10-CM | POA: Insufficient documentation

## 2021-01-25 DIAGNOSIS — C7801 Secondary malignant neoplasm of right lung: Secondary | ICD-10-CM | POA: Insufficient documentation

## 2021-01-25 DIAGNOSIS — Z79899 Other long term (current) drug therapy: Secondary | ICD-10-CM | POA: Diagnosis not present

## 2021-01-25 DIAGNOSIS — Z794 Long term (current) use of insulin: Secondary | ICD-10-CM | POA: Insufficient documentation

## 2021-01-25 DIAGNOSIS — Z86718 Personal history of other venous thrombosis and embolism: Secondary | ICD-10-CM | POA: Diagnosis not present

## 2021-01-25 DIAGNOSIS — Z87891 Personal history of nicotine dependence: Secondary | ICD-10-CM | POA: Diagnosis not present

## 2021-01-25 DIAGNOSIS — K766 Portal hypertension: Secondary | ICD-10-CM | POA: Insufficient documentation

## 2021-01-25 NOTE — Progress Notes (Signed)
Radiation Oncology         [phone] ________________________________  Name: Gary Nelson        MRN: [id number]  Date of Service: 01/25/2021 DOB: 1940/02/16  MV:[facility]  Derek Jack, MD     REFERRING PHYSICIAN: Derek Jack, MD   DIAGNOSIS: The primary encounter diagnosis was Malignant neoplasm of colon, unspecified part of colon ([place]). Diagnoses of Adenocarcinoma of colon ([place]) and Malignant neoplasm metastatic to right lung [facility]) were also pertinent to this visit.   HISTORY OF PRESENT ILLNESS: Gary Nelson is a 80 y.o. male seen at the request of Dr. Delton Coombes for a diagnosis of recurrent metastatic carcinoma of the colon.  The patient was originally diagnosed in 2014 with his colon cancer and underwent a partial colectomy with Dr. Nedra Hai in August 2014 showing a 4.5 centimeters poorly differentiated adenocarcinoma.  Margins were negative and 7 lymph nodes were negative for metastatic disease.  His Oncotype recurrence score risk was high at 26 and he began adjuvant chemotherapy for 6 months.  He was followed in surveillance he had a PET scan in 2017 showing increased metabolic uptake in the right lower lobe and a pulmonary nodule and a nonspecific focus of uptake in the left femoral neck.  He underwent a biopsy to that lesion on 09/16/2015 and final pathology showed metastatic adenocarcinoma consistent with colorectal primary.  He received SBRT to this lesion in September 2017 with Dr. [name].  He has been followed since and had enlarging changes of that same right lower lobe nodule by pet imaging in October 2018.  The area in the right lung was treated with stereotactic body radiation again.  He was started back on systemic therapy in August 2019.  Most recently however he has had a PET scan in August 2022 showing a lesion in the right lung base that has slowly increased in size in the right lower lobe now with an SUV of 18.95 he was started on  Stivarga.  He had a repeat PET scan on 01/06/2021 that showed slight increase in the lesion in the right lower lobe measuring up to 4.6 cm compared to 4 cm in August 2022.  The SUV of the lesion was persistently elevated at 18.  No other evidence of metastatic disease was identified.  He is seen to discuss other salvage options of radiotherapy.  Of note we did meet back with him in 2019 for the same issue and Dr. [name] did not feel that he was a candidate for additional stereotactic radiation to this location. He's seen today to discuss options of local therapy.    PREVIOUS RADIATION THERAPY: Yes   01/02/2017-01/12/2017 SBRT Treatment:The tumor in the right lower lobe was treated with a course of stereotactic body radiation treatment. The patient received 50 Gy In 5 fractions at 10 Gy per fraction.   10/12/2015-10/22/2015 SBRT Treatment: The tumor in the right lower lobe was treated with a course of stereotactic body radiation treatment. The patient received 50 Gy In 5 fractions at 10 G per fraction.     PAST MEDICAL HISTORY:  Past Medical History:  Diagnosis Date   Adenocarcinoma of colon ([place])    RLL -  Pulmonary nodule (06/2015, concerning for mets vs primary; to see RAD-ON[facility] Dr. 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/28/2019   Cystitis    Diabetes mellitus without complication ([place])  Type II   DVT (deep venous thrombosis) ([place]) 2014   post op   Hematuria    Hypertension    Iron deficiency 07/31/2019   Neuropathy    Portal hypertension ([place]) 07/28/2019   Prostatitis    Pulmonary nodule    Sleep apnea        PAST SURGICAL HISTORY: Past Surgical History:  Procedure Laterality Date   BACK SURGERY     CATARACT EXTRACTION     left   CATARACT EXTRACTION W/PHACO Right 04/11/2012   Procedure: CATARACT EXTRACTION PHACO AND INTRAOCULAR LENS PLACEMENT ([place]);  Surgeon: Tonny Branch, MD;  Location: [facility];   Service: Ophthalmology;  Laterality: Right;  CDE:62.48   CERVICAL FUSION     c4-c5   CHOLECYSTECTOMY     COLON SURGERY  09/29/12   Colon resection for cancer   COLONOSCOPY     EYE SURGERY     cataract   GIVENS CAPSULE STUDY N/A 03/15/2015   Procedure: GIVENS CAPSULE STUDY;  Surgeon: Rogene Houston, MD;  Location: [facility];  Service: Endoscopy;  Laterality: N/A;  730   LEG SURGERY     ORIF FEMUR FRACTURE     right   POSTERIOR CERVICAL FUSION/FORAMINOTOMY N/A 07/27/2015   Procedure: Cervical four to Cervical seven Laminectomy with Cervical four to Thoracic one Dorsal internal fixation and fusion;  Surgeon: Kevan Ny Ditty, MD;  Location: [place] [facility];  Service: Neurosurgery;  Laterality: N/A;  C4 to C7 Laminectomy with C4 to T1 Dorsal internal fixation and fusion     FAMILY HISTORY:  Family History  Problem Relation Age of Onset   Heart disease Father    Heart attack Father    Alzheimer's disease Mother      SOCIAL HISTORY:  reports that he has quit smoking. His smoking use included cigarettes. He started smoking about 66 years ago. He has a 55.00 pack-year smoking history. He has never used smokeless tobacco. He reports that he does not currently use alcohol after a past usage of about 3.0 standard drinks per week. He reports that he does not use drugs. The patient is single and lives alone. He is accompanied by his brother Laverna Peace.    ALLERGIES: Patient has no known allergies.   MEDICATIONS:  Current Outpatient Medications  Medication Sig Dispense Refill   aspirin EC 81 MG tablet Take 81 mg by mouth daily.     [employer] B-12 5000 MCG SUBL Place 1 tablet (5,000 mcg total) under the tongue daily. 30 tablet 6   furosemide (LASIX) 20 MG tablet Take 20 mg by mouth every other day.      gabapentin (NEURONTIN) 300 MG capsule Take 2 capsules (600 mg total) by mouth 3 (three) times daily. (Patient taking differently: Take 600 mg by mouth 2 (two) times daily.) 180 capsule 2   insulin  NPH (HUMULIN N,NOVOLIN N) 100 UNIT/ML injection Inject 15-30 Units into the skin See admin instructions. Per pt, 5 units every morning and 5 units in the evening     ketoconazole (NIZORAL) 2 % cream Apply topically as needed.     lisinopril (PRINIVIL,ZESTRIL) 40 MG tablet Take 40 mg by mouth daily.     naproxen (NAPROSYN) 500 MG tablet TAKE 1 TABLET BY MOUTH TWICE A DAY WITH MEALS     OXYGEN Inhale 2 L into the lungs continuous. At night time only     phenylephrine-shark liver oil-mineral oil-petrolatum (PREPARATION H) 0.25-14-74.9 % rectal ointment Place 1 application rectally as needed  for hemorrhoids.     silver sulfADIAZINE (SILVADENE) 1 % cream Apply 1 application topically as needed.     sucralfate (CARAFATE) 1 GM/10ML suspension SWISH AND SWALLOW 15ML FOUR TIMES DAILY     tamsulosin (FLOMAX) 0.4 MG CAPS capsule Take 1 capsule by mouth daily.     atorvastatin (LIPITOR) 10 MG tablet Take 10 mg by mouth daily at 6 PM.      diphenoxylate-atropine (LOMOTIL) 2.5-0.025 MG tablet Take 1 tablet by mouth 4 (four) times daily as needed for diarrhea or loose stools. (Patient not taking: Reported on 01/13/2021) 60 tablet 0   regorafenib (STIVARGA) 40 MG tablet Take 2 tablets (80 mg total) by mouth daily with breakfast. Take with low fat meal.Take for 21 days, then hold for 7 days. Repeat every 28 days. (Patient not taking: Reported on 01/25/2021) 42 tablet 3   No current facility-administered medications for this encounter.   Facility-Administered Medications Ordered in Other Encounters  Medication Dose Route Frequency Provider Last Rate Last Admin   sodium polystyrene (KAYEXALATE) 15 GM/60ML suspension 60 g  60 g Oral Once Derek Jack, MD         REVIEW OF SYSTEMS: On review of systems, the patient reports that he is short of breath at baseline with walking short distances. He relies on a wheelchair at doctor's appointments due to limited mobility with hip pain as well. He had lost about 35  pounds earlier this year but has gained this back. He is unable to walk up a flight of stairs, or leave his home other than for trips to the hospital or doctor's appointments. He does have an occasionally productive cough without hemoptysis.  No other specific complaints are noted.      PHYSICAL EXAM:  Wt Readings from Last 3 Encounters:  01/13/21 242 lb 15.2 oz (110.2 kg)  01/06/21 248 lb (112.5 kg)  11/10/20 233 lb 11 oz (106 kg)   Temp Readings from Last 3 Encounters:  01/25/21 (!) 96.8 F (36 C) (Temporal)  01/13/21 97.7 F (36.5 C) (Tympanic)  12/15/20 (!) 97 F (36.1 C) (Tympanic)   BP Readings from Last 3 Encounters:  01/25/21 (!) 157/60  01/13/21 (!) 151/52  12/15/20 (!) 152/71   Pulse Readings from Last 3 Encounters:  01/25/21 68  01/13/21 99  12/15/20 93   Pain Assessment Pain Score: 5  Pain Loc: Hip (arthritis in hips)/10  In general this is an obese but otherwise well appearing caucasian male in no acute distress. He's alert and oriented x4 and appropriate throughout the examination. Cardiopulmonary assessment is negative for acute distress and he exhibits normal effort.     ECOG = 3  0 - Asymptomatic (Fully active, able to carry on all predisease activities without restriction)  1 - Symptomatic but completely ambulatory (Restricted in physically strenuous activity but ambulatory and able to carry out work of a light or sedentary nature. For example, light housework, office work)  2 - Symptomatic, <50% in bed during the day (Ambulatory and capable of all self care but unable to carry out any work activities. Up and about more than 50% of waking hours)  3 - Symptomatic, >50% in bed, but not bedbound (Capable of only limited self-care, confined to bed or chair 50% or more of waking hours)  4 - Bedbound (Completely disabled. Cannot carry on any self-care. Totally confined to bed or chair)  5 - Death   Eustace Pen MM, Creech RH, Tormey DC, et al. [phone]). "Toxicity  and  response criteria of the [facility]". [place] Oncol. 5 (6): 649-55    LABORATORY DATA:  Lab Results  Component Value Date   WBC 9.6 01/13/2021   HGB 10.0 (L) 01/13/2021   HCT 31.9 (L) 01/13/2021   MCV 98.8 01/13/2021   PLT 210 01/13/2021   Lab Results  Component Value Date   NA 137 01/13/2021   K 5.5 (H) 01/13/2021   CL 110 01/13/2021   CO2 22 01/13/2021   Lab Results  Component Value Date   ALT 15 01/13/2021   AST 16 01/13/2021   ALKPHOS 63 01/13/2021   BILITOT 0.4 01/13/2021      RADIOGRAPHY: NM PET Image Initial (PI) Skull Base To Thigh  Result Date: 01/07/2021 CLINICAL DATA:  Subsequent treatment strategy for colorectal cancer with lung mets. EXAM: NUCLEAR MEDICINE PET SKULL BASE TO THIGH TECHNIQUE: 12.3 mCi F-18 FDG was injected intravenously. Full-ring PET imaging was performed from the skull base to thigh after the radiotracer. CT data was obtained and used for attenuation correction and anatomic localization. Fasting blood glucose: 61 mg/dl COMPARISON:  09/08/2020 FINDINGS: Mediastinal blood pool activity: SUV max 2.5 Liver activity: SUV max NA NECK: No hypermetabolic lymph nodes in the neck. Incidental CT findings: none CHEST: Irregular mass in the medial right lung base has increased slightly in size in the interval measuring 4.6 cm today compared to 4.0 cm previously. Lesion remains hypermetabolic with SUV max = 51.7, similar to previous at 18.95. No other hypermetabolic pulmonary nodule or mass. No hypermetabolic lymphadenopathy in the chest. Incidental CT findings: Right Port-A-Cath tip is positioned at the innominate vein confluence. Mild atherosclerotic calcification is noted in the wall of the thoracic aorta. Coronary artery calcification is evident. Small right pleural effusion again noted ABDOMEN/PELVIS: No abnormal hypermetabolic activity within the liver, pancreas, adrenal glands, or spleen. No hypermetabolic lymph nodes in the  abdomen or pelvis. Incidental CT findings: Gallbladder surgically absent. There is moderate atherosclerotic calcification of the abdominal aorta without aneurysm. Gas is identified in the bladder lumen. SKELETON: Somewhat mottled marrow uptake without definite hypermetabolic osseous metastatic lesion Incidental CT findings: No worrisome lytic or sclerotic osseous abnormality. Posttraumatic deformity noted proximal right femur. IMPRESSION: 1. Slight interval increase in size of the right lower lobe pulmonary mass which remains markedly hypermetabolic, consistent with metabolically active tumor. 2. No new sites of hypermetabolism in the neck, chest, abdomen, or pelvis on today's study. 3. Small right pleural effusion. 4.  Aortic Atherosclerois (ICD10-170.0) Electronically Signed   By: Misty Stanley M.D.   On: 01/07/2021 10:38       IMPRESSION/PLAN: 1. Recurrent metastatic colon cancer to the RLL. Dr. [name] discusses the patient's course to date. Unfortunately given his previous courses of high dose radiotherapy in the form of SBRT, he is not a candidate for this type of therapy a third time given risks of progressive lung fibrosis, and risks of function to the diaphragm. I'll reach out to Dr. Kathlene Cote in IR as previously CT guided ablation was discussed. If the patient would be a candidate after Dr. Margaretmary Dys review, we would refer for formal evaluation. If he is not, the only other option of radiotherapy would be a palliative course of therapy which would not likely provide long term local control, especially since disease has persisted despite SBRT. The patient is in agreement with our discussion today, so we will follow up with him after further discussion of his case. I don't think he would be a candidate  for surgical resection given his poor tolerance to short distances of walking.    In a visit lasting 45 minutes, greater than 50% of the time was spent face to face discussing the patient's condition, in  preparation for the discussion, and coordinating the patient's care.   The above documentation reflects my direct findings during this shared patient visit. Please see the separate note by Dr. [name] on this date for the remainder of the patient's plan of care.    Carola Rhine, [facility]   **Disclaimer: This note was dictated with voice recognition software. Similar sounding words can inadvertently be transcribed and this note may contain transcription errors which may not have been corrected upon publication of note.**

## 2021-01-31 ENCOUNTER — Encounter (HOSPITAL_COMMUNITY): Payer: Self-pay | Admitting: Oncology

## 2021-03-03 ENCOUNTER — Encounter: Payer: Self-pay | Admitting: Radiation Oncology

## 2021-03-03 ENCOUNTER — Other Ambulatory Visit: Payer: Self-pay

## 2021-03-03 ENCOUNTER — Telehealth (HOSPITAL_COMMUNITY): Payer: Self-pay | Admitting: Pharmacist

## 2021-03-03 ENCOUNTER — Other Ambulatory Visit (HOSPITAL_COMMUNITY): Payer: Self-pay

## 2021-03-03 ENCOUNTER — Inpatient Hospital Stay (HOSPITAL_COMMUNITY): Payer: Medicare Other

## 2021-03-03 ENCOUNTER — Inpatient Hospital Stay (HOSPITAL_COMMUNITY): Payer: Medicare Other | Attending: Hematology | Admitting: Hematology

## 2021-03-03 VITALS — BP 189/81 | HR 93 | Temp 97.0°F | Resp 20 | Ht 61.0 in | Wt 248.4 lb

## 2021-03-03 DIAGNOSIS — Z8249 Family history of ischemic heart disease and other diseases of the circulatory system: Secondary | ICD-10-CM | POA: Insufficient documentation

## 2021-03-03 DIAGNOSIS — J439 Emphysema, unspecified: Secondary | ICD-10-CM | POA: Diagnosis not present

## 2021-03-03 DIAGNOSIS — R5383 Other fatigue: Secondary | ICD-10-CM | POA: Diagnosis not present

## 2021-03-03 DIAGNOSIS — I700 Atherosclerosis of aorta: Secondary | ICD-10-CM | POA: Insufficient documentation

## 2021-03-03 DIAGNOSIS — R21 Rash and other nonspecific skin eruption: Secondary | ICD-10-CM | POA: Insufficient documentation

## 2021-03-03 DIAGNOSIS — C7800 Secondary malignant neoplasm of unspecified lung: Secondary | ICD-10-CM | POA: Insufficient documentation

## 2021-03-03 DIAGNOSIS — C7801 Secondary malignant neoplasm of right lung: Secondary | ICD-10-CM

## 2021-03-03 DIAGNOSIS — Z87891 Personal history of nicotine dependence: Secondary | ICD-10-CM | POA: Insufficient documentation

## 2021-03-03 DIAGNOSIS — K5900 Constipation, unspecified: Secondary | ICD-10-CM | POA: Insufficient documentation

## 2021-03-03 DIAGNOSIS — R32 Unspecified urinary incontinence: Secondary | ICD-10-CM | POA: Insufficient documentation

## 2021-03-03 DIAGNOSIS — E875 Hyperkalemia: Secondary | ICD-10-CM | POA: Insufficient documentation

## 2021-03-03 DIAGNOSIS — I10 Essential (primary) hypertension: Secondary | ICD-10-CM | POA: Diagnosis not present

## 2021-03-03 DIAGNOSIS — Z9049 Acquired absence of other specified parts of digestive tract: Secondary | ICD-10-CM | POA: Diagnosis not present

## 2021-03-03 DIAGNOSIS — M2550 Pain in unspecified joint: Secondary | ICD-10-CM | POA: Insufficient documentation

## 2021-03-03 DIAGNOSIS — K7460 Unspecified cirrhosis of liver: Secondary | ICD-10-CM | POA: Insufficient documentation

## 2021-03-03 DIAGNOSIS — E669 Obesity, unspecified: Secondary | ICD-10-CM | POA: Diagnosis not present

## 2021-03-03 DIAGNOSIS — F32A Depression, unspecified: Secondary | ICD-10-CM | POA: Diagnosis not present

## 2021-03-03 DIAGNOSIS — Z79899 Other long term (current) drug therapy: Secondary | ICD-10-CM | POA: Diagnosis not present

## 2021-03-03 DIAGNOSIS — J90 Pleural effusion, not elsewhere classified: Secondary | ICD-10-CM | POA: Diagnosis not present

## 2021-03-03 DIAGNOSIS — R200 Anesthesia of skin: Secondary | ICD-10-CM | POA: Diagnosis not present

## 2021-03-03 DIAGNOSIS — Z818 Family history of other mental and behavioral disorders: Secondary | ICD-10-CM | POA: Diagnosis not present

## 2021-03-03 DIAGNOSIS — E119 Type 2 diabetes mellitus without complications: Secondary | ICD-10-CM | POA: Insufficient documentation

## 2021-03-03 DIAGNOSIS — Z86718 Personal history of other venous thrombosis and embolism: Secondary | ICD-10-CM | POA: Diagnosis not present

## 2021-03-03 DIAGNOSIS — C189 Malignant neoplasm of colon, unspecified: Secondary | ICD-10-CM | POA: Diagnosis present

## 2021-03-03 LAB — CBC WITH DIFFERENTIAL/PLATELET
Abs Immature Granulocytes: 0.02 10*3/uL (ref 0.00–0.07)
Basophils Absolute: 0.1 10*3/uL (ref 0.0–0.1)
Basophils Relative: 1 %
Eosinophils Absolute: 0.3 10*3/uL (ref 0.0–0.5)
Eosinophils Relative: 4 %
HCT: 33.7 % — ABNORMAL LOW (ref 39.0–52.0)
Hemoglobin: 10.9 g/dL — ABNORMAL LOW (ref 13.0–17.0)
Immature Granulocytes: 0 %
Lymphocytes Relative: 14 %
Lymphs Abs: 1 10*3/uL (ref 0.7–4.0)
MCH: 32.4 pg (ref 26.0–34.0)
MCHC: 32.3 g/dL (ref 30.0–36.0)
MCV: 100.3 fL — ABNORMAL HIGH (ref 80.0–100.0)
Monocytes Absolute: 0.7 10*3/uL (ref 0.1–1.0)
Monocytes Relative: 10 %
Neutro Abs: 5.2 10*3/uL (ref 1.7–7.7)
Neutrophils Relative %: 71 %
Platelets: 192 10*3/uL (ref 150–400)
RBC: 3.36 MIL/uL — ABNORMAL LOW (ref 4.22–5.81)
RDW: 13.6 % (ref 11.5–15.5)
WBC: 7.2 10*3/uL (ref 4.0–10.5)
nRBC: 0 % (ref 0.0–0.2)

## 2021-03-03 LAB — COMPREHENSIVE METABOLIC PANEL
ALT: 15 U/L (ref 0–44)
AST: 19 U/L (ref 15–41)
Albumin: 3.2 g/dL — ABNORMAL LOW (ref 3.5–5.0)
Alkaline Phosphatase: 69 U/L (ref 38–126)
Anion gap: 5 (ref 5–15)
BUN: 44 mg/dL — ABNORMAL HIGH (ref 8–23)
CO2: 24 mmol/L (ref 22–32)
Calcium: 8.5 mg/dL — ABNORMAL LOW (ref 8.9–10.3)
Chloride: 107 mmol/L (ref 98–111)
Creatinine, Ser: 1.45 mg/dL — ABNORMAL HIGH (ref 0.61–1.24)
GFR, Estimated: 49 mL/min — ABNORMAL LOW (ref >60)
Glucose, Bld: 149 mg/dL — ABNORMAL HIGH (ref 70–99)
Potassium: 6.4 mmol/L (ref 3.5–5.1)
Sodium: 136 mmol/L (ref 135–145)
Total Bilirubin: 0.2 mg/dL — ABNORMAL LOW (ref 0.3–1.2)
Total Protein: 6.7 g/dL (ref 6.5–8.1)

## 2021-03-03 MED ORDER — SODIUM POLYSTYRENE SULFONATE 15 GM/60ML PO SUSP: 60.0000 g | Freq: Once | ORAL | 0 refills | Status: AC

## 2021-03-03 MED ORDER — INSULIN ASPART 100 UNIT/ML IJ SOLN
10.0000 [IU] | Freq: Once | INTRAMUSCULAR | Status: AC
  Administered 2021-03-03: 10 [IU] via INTRAVENOUS
  Filled 2021-03-03: qty 0.1

## 2021-03-03 MED ORDER — DEXTROSE 50 % IV SOLN
50.0000 mL | Freq: Once | INTRAVENOUS | Status: AC
  Administered 2021-03-03: 50 mL via INTRAVENOUS
  Filled 2021-03-03: qty 50

## 2021-03-03 MED ORDER — SODIUM POLYSTYRENE SULFONATE 15 GM/60ML PO SUSP
60.0000 g | Freq: Once | ORAL | Status: AC
  Administered 2021-03-03: 60 g via ORAL
  Filled 2021-03-03: qty 240

## 2021-03-03 MED ORDER — SODIUM CHLORIDE 0.9 % IV SOLN: INTRAVENOUS | Status: DC

## 2021-03-03 MED ORDER — LONSURF 20-8.19 MG PO TABS
20.0000 mg/m2 | ORAL_TABLET | Freq: Two times a day (BID) | ORAL | 0 refills | Status: DC
  Filled 2021-03-03: qty 40, 10d supply, fill #0
  Filled 2021-03-21: qty 40, 28d supply, fill #0

## 2021-03-03 NOTE — Progress Notes (Signed)
Treatment given today per MD orders. Tolerated without adverse affects. Vital signs stable. No complaints at this time. Discharged from clinic by wheel chair in stable condition. Alert and oriented x 3. F/U with [facility] as scheduled.

## 2021-03-03 NOTE — Progress Notes (Signed)
Patient with potassium level 6.4 today  Orders received for:  Novolog 10 units IV x 1 Dextrose 50% 50 ml x 1 Kayexalate 60 gm po x 1  T.O. Dr Rhys Martini, PharmD

## 2021-03-03 NOTE — Telephone Encounter (Signed)
Oral Oncology Pharmacist Encounter  Received new prescription for Lonsurf (trifluridine/tipiracil)  for the treatment of progressive stage IV colon cancer in conjunction with bevacizumab, planned duration until disease progression or unacceptable drug toxicity. Planned start 03/28/21 in conjunction with IV initiation.   CMP from 03/03/21 assessed, SCr elevated at 1.45mg /dL, CrCl ~58mL/min. Continue to monitor renal function, no dose adjustment needed at this time. Prescription dose and frequency assessed.   Current medication list in Epic reviewed, no DDIs with Lonsurf identified.  Evaluated chart and no patient barriers to medication adherence identified.   Prescription has been e-scribed to the [facility] for benefits analysis and approval.  Oral Oncology Clinic will continue to follow for insurance authorization, copayment issues, initial counseling and start date.   Darl Pikes, PharmD, BCPS, BCOP, CPP Hematology/Oncology Clinical Pharmacist Practitioner [place]/[facility] [place] [phone]  03/03/2021 3:48 PM

## 2021-03-03 NOTE — Progress Notes (Signed)
Patient on plan of care prior to pathways. 

## 2021-03-03 NOTE — Progress Notes (Signed)
CRITICAL VALUE ALERT Critical value received:  Potassium 6.4 Date of notification:  03-03-2021 Time of notification: 11:12 am.  Critical value read back:  yes Nurse who received alert:  B. Chavonne Sforza RN MD notified time and response:  Dr. Delton Coombes / Lora Havens RN.

## 2021-03-03 NOTE — Progress Notes (Signed)
Gary Nelson, Gary Nelson [id number]   CLINIC:  Medical Oncology/Hematology  PCP:  Ledell Noss, [place] / [place] [address] [phone]   REASON FOR VISIT:  Follow-up for metastatic colon cancer  PRIOR THERAPY: none  NGS Results: not done  CURRENT THERAPY: Stivarga 80 mg daily started on 09/25/2020  BRIEF ONCOLOGIC HISTORY:  Oncology History  Adenocarcinoma of colon ([place])  10/04/2012 Definitive Surgery   Partial collectomy by Dr. [name]   10/04/2012 Pathology Results   4.5 cm poorly differentiated adenocarcinoma, negative margins, 0/7 lymph nodes for metastatic disease.  Oncotype recurrence score of 26 (high)    Chemotherapy   FOLFOX    Adverse Reaction   Progressive peripheral neuropathy    Chemotherapy   Infusional 5 FU.  Completed 6 months worth of adjuvant therapy.   02/23/2014 Procedure   Colonoscopy by Dr. Anthony Sar.   07/01/2015 PET scan   There is malignant range uptake with RLL pulmonary nodule suspicious for metastatic disease or primary pulm neoplasm. Nonspecific focus of uptake in the L femoral neck. No corresponding CT abnormality. Cannot rule out metastasis.   07/06/2015 Imaging   MR C-spine- C5-6 there is a broad-based disc bulge w R paracentral disc protrusion deforming the spinal cord. Moderate R foraminal stenosis. C6-7 there is a central disc protrusion slightly eccentric towards the R and mildly deforming the ventral c-spine    09/16/2015 Procedure   RLL needle biopsy by IR.   09/17/2015 Pathology Results   Metastatic adenocarcinoma consistent with a primary colorectal adenocarcinoma.   10/12/2015 - 10/22/2015 Radiation Therapy   SBRT by Dr. [name] to RLL pulmonary lesion (biopsy proven to be oligometastasis).   12/09/2015 Imaging   CT chest- 1. Slight interval decrease in size of the right lower lobe pulmonary nodule. Some surrounding radiation changes. 2. No mediastinal or hilar mass or  adenopathy. 3. No new pulmonary nodules. 4. Stable underline emphysematous changes. 5. Cirrhotic changes involving the liver but no upper abdominal metastatic disease.   03/14/2016 Imaging   CT chest- 1. Continued mild reduction in the size of the treated right lobe pulmonary nodule. 2. Increased patchy consolidation, reticulation and ground-glass attenuation in the basilar right lower lobe, nonspecific, favor evolving postradiation change, recommend attention on follow-up chest CT. 3. No evidence of new or progressive metastatic disease in the chest. 4. Aortic atherosclerosis. Left main and 3 vessel coronary atherosclerosis. 5. Mild emphysema.   06/30/2016 Imaging   CT CAP- 1. The appearance of the treated right lower lobe nodule is essentially unchanged, and there are evolving postradiation changes in the base of the right lower lobe, as above. No definite signs of new metastatic disease are noted elsewhere in the chest, abdomen or pelvis. 2. Aortic atherosclerosis, in addition to left main and 3 vessel coronary artery disease. Assessment for potential risk factor modification, dietary therapy or pharmacologic therapy may be warranted, if clinically indicated. 3. Morphologic changes in the liver suggestive of cirrhosis, as above. 4. Additional incidental findings, similar prior studies, as above.   11/17/2016 Imaging   CT CAP Study Result   CLINICAL DATA:  Stage IV colon cancer originally diagnosed in August 2014 status post partial colectomy, with right lower lobe lung metastasis diagnosed August 2017 status post SBRT completed 10/22/2015. Restaging.   EXAM: CT CHEST, ABDOMEN, AND PELVIS WITH CONTRAST   TECHNIQUE: Multidetector CT imaging of the chest, abdomen and pelvis was performed following the standard protocol during bolus  administration of intravenous contrast.   CONTRAST:  171mL ISOVUE-300 IOPAMIDOL (ISOVUE-300) INJECTION 61%   COMPARISON:  06/30/2016 CT  chest, abdomen and pelvis.   FINDINGS: CT CHEST FINDINGS   Cardiovascular: Normal heart size. No significant pericardial fluid/thickening. Left main, left anterior descending, left circumflex and right coronary atherosclerosis. Atherosclerotic nonaneurysmal thoracic aorta. Normal caliber pulmonary arteries. No central pulmonary emboli. Right internal jugular MediPort terminates at the junction of the right and left brachiocephalic veins.   Mediastinum/Nodes: No discrete thyroid nodules. Unremarkable esophagus. No pathologically enlarged axillary, mediastinal or hilar lymph nodes.   Lungs/Pleura: No pneumothorax. No pleural effusion. There is a nodular 2.7 x 1.6 cm focus of consolidation in the medial basilar right lower lobe (series 3/ image 108), previously 1.6 x 1.1 cm. No appreciable change in patchy subpleural reticulation in both lungs without significant traction bronchiectasis or frank honeycombing. Mild centrilobular and paraseptal emphysema with mild diffuse bronchial wall thickening. No acute consolidative airspace disease or new significant pulmonary nodules.   Musculoskeletal: No aggressive appearing focal osseous lesions. Moderate thoracic spondylosis. Stable chronic left scapular deformity.   CT ABDOMEN PELVIS FINDINGS   Hepatobiliary: Normal liver with no liver mass. Cholecystectomy. No biliary ductal dilatation.   Pancreas: Normal, with no mass or duct dilation.   Spleen: Normal size. No mass.   Adrenals/Urinary Tract: Normal adrenals. No hydronephrosis. No renal masses. Normal bladder.   Stomach/Bowel: Grossly normal stomach. Normal caliber small bowel with no small bowel wall thickening. Stable appearance of the appendix, within normal limits. Stable postsurgical changes from partial distal colectomy with intact appearing distal colonic anastomosis. Oral contrast transits to the cecum. No large bowel wall thickening or new pericholecystic fat stranding  .   Vascular/Lymphatic: Atherosclerotic nonaneurysmal abdominal aorta. Patent portal, splenic, hepatic and renal veins. No pathologically enlarged lymph nodes in the abdomen or pelvis.   Reproductive:  Normal size prostate.   Other: No pneumoperitoneum, ascites or focal fluid collection. Stable subcutaneous calcified granulomas throughout the left lower back and gluteal regions.   Musculoskeletal: No aggressive appearing focal osseous lesions. Marked lumbar spondylosis.   IMPRESSION: 1. Nodular focus of consolidation in the medial basilar right lower lobe is increased in size. While this may represent evolving masslike radiation fibrosis, recurrence of the lung metastasis is not excluded. PET-CT would be useful for further evaluation. Otherwise, continued close chest CT surveillance is advised . 2. No additional potential new sites of metastatic disease in the chest, abdomen or pelvis. 3. No evidence of local tumor recurrence at the distal colonic anastomosis. 4. Left main and 3 vessel coronary atherosclerosis . 5. Aortic Atherosclerosis (ICD10-I70.0) and Emphysema (ICD10-J43.9).      11/30/2016 Relapse/Recurrence   PET: IMPRESSION: 1. Enlarging right lower lobe pulmonary nodule and slight increase in the SUV max suggesting residual neoplasm. No new pulmonary lesions. 2. No findings for local recurrence or abdominal/pelvic metastatic disease.   01/02/2017 - 01/12/2017 Radiation Therapy    The patient saw Dr. [name] for SBRT radiation treatment. The tumor in the right lung was treated with a course of stereotactic body radiation treatment. The patient received 50 Gy In 5 fractions at 10 Gy per fraction.      10/05/2017 - 10/05/2017 Chemotherapy   The patient had bevacizumab (AVASTIN) 900 mg in sodium chloride 0.9 % 100 mL chemo infusion, 7.3 mg/kg = 925 mg, Intravenous,  Once, 1 of 3 cycles Administration: 900 mg (10/05/2017)   for chemotherapy treatment.       CANCER  STAGING:  Cancer  Staging  Adenocarcinoma of colon [facility]) Staging form: Colon and Rectum, AJCC 7th Edition - Clinical stage from 10/04/2012: Stage IIA (T3, N0, M0) - Signed by Baird Cancer, PA-C on 07/15/2015 - Pathologic stage from 09/17/2015: Stage IVA (T3, N0, M1a) - Signed by Baird Cancer, PA-C on 10/05/2015   INTERVAL HISTORY:  Mr. BROCK LARMON, a 81 y.o. male, returns for routine follow-up of his metastatic colon cancer. Gary Nelson was last seen on 01/13/2021.   Today he reports feeling well. He denies CP and current bleeding. His weight is stable.   REVIEW OF SYSTEMS:  Review of Systems  Constitutional:  Positive for fatigue. Negative for appetite change.  HENT:   Positive for trouble swallowing. Negative for nosebleeds.   Respiratory:  Positive for cough and shortness of breath. Negative for hemoptysis.   Cardiovascular:  Negative for chest pain.  Gastrointestinal:  Positive for constipation and diarrhea. Negative for blood in stool.  Genitourinary:  Positive for bladder incontinence. Negative for hematuria.   Musculoskeletal:  Positive for arthralgias (6/10 hips).  Neurological:  Positive for numbness.  Hematological:  Does not bruise/bleed easily.  Psychiatric/Behavioral:  Positive for depression. The patient is nervous/anxious.   All other systems reviewed and are negative.  PAST MEDICAL/SURGICAL HISTORY:  Past Medical History:  Diagnosis Date   Adenocarcinoma of colon ([place])    RLL -  Pulmonary nodule (06/2015, concerning for mets vs primary; to see RAD-ONC Dr. 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/28/2019   Cystitis    Diabetes mellitus without complication ([place])    Type II   DVT (deep venous thrombosis) ([place]) 2014   post op   Hematuria    Hypertension    Iron deficiency 07/31/2019   Neuropathy    Portal hypertension ([place]) 07/28/2019   Prostatitis    Pulmonary nodule    Sleep  apnea    Past Surgical History:  Procedure Laterality Date   BACK SURGERY     CATARACT EXTRACTION     left   CATARACT EXTRACTION W/PHACO Right 04/11/2012   Procedure: CATARACT EXTRACTION PHACO AND INTRAOCULAR LENS PLACEMENT ([place]);  Surgeon: Tonny Branch, MD;  Location: [facility] ORS;  Service: Ophthalmology;  Laterality: Right;  CDE:62.48   CERVICAL FUSION     c4-c5   CHOLECYSTECTOMY     COLON SURGERY  09/29/12   Colon resection for cancer   COLONOSCOPY     EYE SURGERY     cataract   GIVENS CAPSULE STUDY N/A 03/15/2015   Procedure: GIVENS CAPSULE STUDY;  Surgeon: Rogene Houston, MD;  Location: [facility] ENDO SUITE;  Service: Endoscopy;  Laterality: N/A;  730   LEG SURGERY     ORIF FEMUR FRACTURE     right   POSTERIOR CERVICAL FUSION/FORAMINOTOMY N/A 07/27/2015   Procedure: Cervical four to Cervical seven Laminectomy with Cervical four to Thoracic one Dorsal internal fixation and fusion;  Surgeon: Kevan Ny Ditty, MD;  Location: [facility]URO ORS;  Service: Neurosurgery;  Laterality: N/A;  C4 to C7 Laminectomy with C4 to T1 Dorsal internal fixation and fusion    SOCIAL HISTORY:  Social History   Socioeconomic History   Marital status: Single    Spouse name: Not on file   Number of children: Not on file   Years of education: Not on file   Highest education level: Not on file  Occupational History   Not  on file  Tobacco Use   Smoking status: Former    Packs/day: 1.00    Years: 55.00    Pack years: 55.00    Types: Cigarettes    Start date: 02/07/1955   Smokeless tobacco: Never   Tobacco comments:    quit 2015  Substance and Sexual Activity   Alcohol use: Not Currently    Alcohol/week: 3.0 standard drinks    Types: 3 Cans of beer per week   Drug use: No   Sexual activity: Yes    Birth control/protection: None  Other Topics Concern   Not on file  Social History Narrative   Not on file   Social Determinants of Health   Financial Resource Strain: Not on file  Food Insecurity: Not on  file  Transportation Needs: Not on file  Physical Activity: Not on file  Stress: Not on file  Social Connections: Not on file  Intimate Partner Violence: Not on file    FAMILY HISTORY:  Family History  Problem Relation Age of Onset   Heart disease Father    Heart attack Father    Alzheimer's disease Mother     CURRENT MEDICATIONS:  Current Outpatient Medications  Medication Sig Dispense Refill   aspirin EC 81 MG tablet Take 81 mg by mouth daily.     [employer] B-12 5000 MCG SUBL Place 1 tablet (5,000 mcg total) under the tongue daily. 30 tablet 6   diphenoxylate-atropine (LOMOTIL) 2.5-0.025 MG tablet Take 1 tablet by mouth 4 (four) times daily as needed for diarrhea or loose stools. 60 tablet 0   furosemide (LASIX) 20 MG tablet Take 20 mg by mouth every other day.      gabapentin (NEURONTIN) 300 MG capsule Take 2 capsules (600 mg total) by mouth 3 (three) times daily. (Patient taking differently: Take 600 mg by mouth 2 (two) times daily.) 180 capsule 2   insulin NPH (HUMULIN N,NOVOLIN N) 100 UNIT/ML injection Inject 15-30 Units into the skin See admin instructions. Per pt, 5 units every morning and 5 units in the evening     ketoconazole (NIZORAL) 2 % cream Apply topically as needed.     lisinopril (PRINIVIL,ZESTRIL) 40 MG tablet Take 40 mg by mouth daily.     naproxen (NAPROSYN) 500 MG tablet TAKE 1 TABLET BY MOUTH TWICE A DAY WITH MEALS     OXYGEN Inhale 2 L into the lungs continuous. At night time only     phenylephrine-shark liver oil-mineral oil-petrolatum (PREPARATION H) 0.25-14-74.9 % rectal ointment Place 1 application rectally as needed for hemorrhoids.     silver sulfADIAZINE (SILVADENE) 1 % cream Apply 1 application topically as needed.     sodium polystyrene (KAYEXALATE) 15 GM/60ML suspension Take 240 mLs (60 g total) by mouth once for 1 dose. 240 mL 0   sucralfate (CARAFATE) 1 GM/10ML suspension SWISH AND SWALLOW 15ML FOUR TIMES DAILY     tamsulosin (FLOMAX) 0.4 MG CAPS capsule  Take 1 capsule by mouth daily.     atorvastatin (LIPITOR) 10 MG tablet Take 10 mg by mouth daily at 6 PM.      regorafenib (STIVARGA) 40 MG tablet Take 2 tablets (80 mg total) by mouth daily with breakfast. Take with low fat meal.Take for 21 days, then hold for 7 days. Repeat every 28 days. (Patient not taking: Reported on 03/03/2021) 42 tablet 3   No current facility-administered medications for this visit.    ALLERGIES:  No Known Allergies  PHYSICAL EXAM:  Performance status (ECOG):  2 - Symptomatic, <50% confined to bed  Vitals:   03/03/21 1120  BP: (!) 189/81  Pulse: 93  Resp: 20  Temp: (!) 97 F (36.1 C)  SpO2: 91%   Wt Readings from Last 3 Encounters:  03/03/21 248 lb 6.4 oz (112.7 kg)  01/13/21 242 lb 15.2 oz (110.2 kg)  01/06/21 248 lb (112.5 kg)   Physical Exam Vitals reviewed.  Constitutional:      Appearance: Normal appearance. He is obese.     Comments: In wheelchair  Cardiovascular:     Rate and Rhythm: Normal rate and regular rhythm.     Pulses: Normal pulses.     Heart sounds: Normal heart sounds.  Pulmonary:     Effort: Pulmonary effort is normal.     Breath sounds: Normal breath sounds.  Neurological:     General: No focal deficit present.     Mental Status: He is alert and oriented to person, place, and time.  Psychiatric:        Mood and Affect: Mood normal.        Behavior: Behavior normal.     LABORATORY DATA:  I have reviewed the labs as listed.  CBC Latest Ref Rng & Units 03/03/2021 01/13/2021 11/10/2020  WBC 4.0 - 10.5 K/uL 7.2 9.6 7.7  Hemoglobin 13.0 - 17.0 g/dL 10.9(L) 10.0(L) 11.2(L)  Hematocrit 39.0 - 52.0 % 33.7(L) 31.9(L) 34.1(L)  Platelets 150 - 400 K/uL 192 210 174   CMP Latest Ref Rng & Units 03/03/2021 01/13/2021 11/10/2020  Glucose 70 - 99 mg/dL 149(H) 112(H) 198(H)  BUN 8 - 23 mg/dL 44(H) 36(H) 37(H)  Creatinine 0.61 - 1.24 mg/dL 1.45(H) 1.22 1.10  Sodium 135 - 145 mmol/L 136 137 136  Potassium 3.5 - 5.1 mmol/L 6.4(HH) 5.5(H)  5.5(H)  Chloride 98 - 111 mmol/L 107 110 109  CO2 22 - 32 mmol/L $RemoveB'24 22 22  '[id number]$ Calcium 8.9 - 10.3 mg/dL 8.5(L) 8.1(L) 7.7(L)  Total Protein 6.5 - 8.1 g/dL 6.7 6.1(L) 6.3(L)  Total Bilirubin 0.3 - 1.2 mg/dL 0.2(L) 0.4 0.8  Alkaline Phos 38 - 126 U/L 69 63 88  AST 15 - 41 U/L $Remo'19 16 25  'UEkWC$ ALT 0 - 44 U/L 15 15 33    DIAGNOSTIC IMAGING:  I have independently reviewed the scans and discussed with the patient. No results found.   ASSESSMENT:  1.  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 - Oligometastasis with right lower lobe pulmonary nodule, status post SBRT from 10/12/2015 through 10/22/2015 by Dr. [name]. - Retreatment with SBRT to the right lung from 01/02/2017 through 01/12/2017. - Xeloda 2 weeks on/1 week off with 1 dose of Avastin on 10/05/2017, discontinued secondary to extensive rash in the groin and buttock region. - Stivarga 80 mg 3 weeks on/1 week off from February 2020 through 12/17/2018. -Stivarga discontinued because of unhealed wounds in the groin.  They have healed up after discontinuation. - Stivarga 80 mg 3 weeks on/1 week off from 09/25/2020 through 01/13/2021 with progression in the right lower lobe lung mass. - RAS/RAF panel on 09/06/2017 shows BRAF [id number] mutation.  K-ras and NRAS negative.   2.  Chronic ulcers: -He had developed ulcers in the right upper thigh which completely healed since we stopped Stivarga.   PLAN:  1.  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  No new sites of metabolic him in the neck, chest, abdomen or pelvis.  Small right pleural effusion. - He was evaluated by Dr. [name] in radiation oncology.  He was not felt to be a candidate for retreatment with radiation. - We have also  reached out to Dr. Kathlene Cote who did not feel ablation is feasible due to the size of the lesion being more than 3 cm. - We talked about starting him on Lonsurf with bevacizumab which has shown superiority to Lonsurf alone. - We will start him on Lonsurf (trifluridine 20 mg/m2) dose with 40 mg twice daily, on days 1 through 5 and days 8-12 every 28 days.  He will receive bevacizumab 5 mg/kg every 2 weeks. - We have discussed side effects in detail. - We will send a prescription to his pharmacy.  RTC 3 weeks for follow-up to initiate treatment. - We will also consider guardant 360 testing.  2.  Hypokalemia: - Potassium has been consistently high for the last few times.  Today it is 6.4. - We have discussed dietary changes.  I have given printouts of potassium low foods. - He will receive insulin and D50 today.  He will also receive Kayexalate today and tomorrow. - He will need Lokelma.  We will make a referral to Dr. Theador Hawthorne.   Orders placed this encounter:  No orders of the defined types were placed in this encounter.    Derek Jack, MD [place] [phone]   I, Thana Ates, am acting as a scribe for Dr. Derek Jack.  I, Derek Jack MD, have reviewed the above documentation for accuracy and completeness, and I agree with the above.

## 2021-03-03 NOTE — Progress Notes (Signed)
START ON PATHWAY REGIMEN - Colorectal     A cycle is every 28 days:     Trifluridine and tipiracil      Bevacizumab-xxxx   **Always confirm dose/schedule in your pharmacy ordering system**  Patient Characteristics: Distant Metastases, Nonsurgical Candidate, BRAF V600 Mutation Positive (KRAS/NRAS Wild-Type), Standard Cytotoxic/Targeted Therapy, Fourth Line and Beyond Standard Cytotoxic/Targeted Therapy Tumor Location: Colon Therapeutic Status: Distant Metastases Microsatellite/Mismatch Repair Status: MSS/pMMR BRAF Mutation Status: Mutation Positive KRAS/NRAS Mutation Status: Wild-Type (no mutation) Standard Cytotoxic/Targeted Line of Therapy: Fourth Line and Beyond Standard Cytotoxic/Targeted Therapy Intent of Therapy: Non-Curative / Palliative Intent, Discussed with Patient

## 2021-03-03 NOTE — Patient Instructions (Addendum)
Gary Nelson at [facility] Discharge Instructions   You were seen and examined today by Dr. Delton Coombes.  Your potassium is critically high today, which can cause you to have heart problems.  We will refer you to a kidney doctor, as your kidney function is elevated, which may be causing your potassium to be high. They will be able to manage your kidney issues as well as your high potassium.   We will give you medications in the clinic to get your potassium down today.  We also sent a prescription for Kayexalate to [facility] for you to take tomorrow as well.   We are awaiting to hear from interventional radiology to see if there is anything they can do on their end to treat your cancer.  In the meantime, we will plan to switch you to a different chemo pill called Lonsurf.  This medication works well with a medication called Avastin, which you have received in the past.  We would have you come to the clinic every 2 weeks to receive this infusion.      Thank you for choosing [place] at [facility] to provide your oncology and hematology care.  To afford each patient quality time with our provider, please arrive at least 15 minutes before your scheduled appointment time.   If you have a lab appointment with the [facility] please come in thru the Main Entrance and check in at the main information desk.  You need to re-schedule your appointment should you arrive 10 or more minutes late.  We strive to give you quality time with our providers, and arriving late affects you and other patients whose appointments are after yours.  Also, if you no show three or more times for appointments you may be dismissed from the clinic at the providers discretion.     Again, thank you for choosing [facility].  Our hope is that these requests will decrease the amount of time that you wait before being seen by our physicians.        _____________________________________________________________  Should you have questions after your visit to [facility] please contact our office at [phone] and follow the prompts.  Our office hours are 8:00 a.m. and 4:30 p.m. Monday - Friday.  Please note that voicemails left after 4:00 p.m. may not be returned until the following business day.  We are closed weekends and major holidays.  You do have access to a nurse 24-7, just call the main number to the clinic [phone] and do not press any options, hold on the line and a nurse will answer the phone.    For prescription refill requests, have your pharmacy contact our office and allow 72 hours.    Due to Covid, you will need to wear a mask upon entering the hospital. If you do not have a mask, a mask will be given to you at the Main Entrance upon arrival. For doctor visits, patients may have 1 support person age 77 or older with them. For treatment visits, patients can not have anyone with them due to social distancing guidelines and our immunocompromised population.

## 2021-03-03 NOTE — Patient Instructions (Addendum)
[place]  Discharge Instructions: Thank you for choosing [facility] to provide your oncology and hematology care.  If you have a lab appointment with the [facility], please come in thru the Main Entrance and check in at the main information desk.  Wear comfortable clothing and clothing appropriate for easy access to any Portacath or PICC line.   We strive to give you quality time with your provider. You may need to reschedule your appointment if you arrive late (15 or more minutes).  Arriving late affects you and other patients whose appointments are after yours.  Also, if you miss three or more appointments without notifying the office, you may be dismissed from the clinic at the providers discretion.      For prescription refill requests, have your pharmacy contact our office and allow 72 hours for refills to be completed.    Today you received the following: 10 Unit of Novolog , Dextrose 50% in 50 mls, Kayexalate to    To help prevent nausea and vomiting after your treatment, we encourage you to take your nausea medication as directed.  BELOW ARE SYMPTOMS THAT SHOULD BE REPORTED IMMEDIATELY: *FEVER GREATER THAN 100.4 F (38 C) OR HIGHER *CHILLS OR SWEATING *NAUSEA AND VOMITING THAT IS NOT CONTROLLED WITH YOUR NAUSEA MEDICATION *UNUSUAL SHORTNESS OF BREATH *UNUSUAL BRUISING OR BLEEDING *URINARY PROBLEMS (pain or burning when urinating, or frequent urination) *BOWEL PROBLEMS (unusual diarrhea, constipation, pain near the anus) TENDERNESS IN MOUTH AND THROAT WITH OR WITHOUT PRESENCE OF ULCERS (sore throat, sores in mouth, or a toothache) UNUSUAL RASH, SWELLING OR PAIN  UNUSUAL VAGINAL DISCHARGE OR ITCHING   Items with * indicate a potential emergency and should be followed up as soon as possible or go to the Emergency Department if any problems should occur.  Please show the CHEMOTHERAPY ALERT CARD or IMMUNOTHERAPY ALERT CARD at check-in to the Emergency  Department and triage nurse.  Should you have questions after your visit or need to cancel or reschedule your appointment, please contact [facility] [phone]  and follow the prompts.  Office hours are 8:00 a.m. to 4:30 p.m. Monday - Friday. Please note that voicemails left after 4:00 p.m. may not be returned until the following business day.  We are closed weekends and major holidays. You have access to a nurse at all times for urgent questions. Please call the main number to the clinic [phone] and follow the prompts.  For any non-urgent questions, you may also contact your provider using MyChart. We now offer e-Visits for anyone 33 and older to request care online for non-urgent symptoms. For details visit [email or web address].   Also download the MyChart app! Go to the app store, search "MyChart", open the app, select , and log in with your MyChart username and password.  Due to Covid, a mask is required upon entering the hospital/clinic. If you do not have a mask, one will be given to you upon arrival. For doctor visits, patients may have 1 support person aged 80 or older with them. For treatment visits, patients cannot have anyone with them due to current Covid guidelines and our immunocompromised population.

## 2021-03-04 ENCOUNTER — Encounter (HOSPITAL_COMMUNITY): Payer: Self-pay | Admitting: Lab

## 2021-03-04 LAB — CEA: CEA: 12.1 ng/mL — ABNORMAL HIGH (ref 0.0–4.7)

## 2021-03-04 NOTE — Progress Notes (Unsigned)
Referral sent to Dr Theador Hawthorne.  Records faxed on  1/27

## 2021-03-07 ENCOUNTER — Telehealth (HOSPITAL_COMMUNITY): Payer: Self-pay | Admitting: Pharmacy Technician

## 2021-03-07 NOTE — Telephone Encounter (Signed)
Oral Oncology Patient Advocate Encounter   Received notification from [employer] that prior authorization for Lonsurf is required.   PA submitted on CoverMyMeds Key [id number]  Status is pending   Oral Oncology Clinic will continue to follow.  [place] Patient [place] Phone [phone] Fax [phone] 03/09/2021 1:38 PM

## 2021-03-09 ENCOUNTER — Other Ambulatory Visit (HOSPITAL_COMMUNITY): Payer: Self-pay

## 2021-03-09 ENCOUNTER — Encounter (HOSPITAL_COMMUNITY): Payer: Self-pay | Admitting: Oncology

## 2021-03-09 ENCOUNTER — Encounter (HOSPITAL_COMMUNITY): Payer: Self-pay | Admitting: Hematology

## 2021-03-09 NOTE — Telephone Encounter (Signed)
Oral Oncology Patient Advocate Encounter  Prior Authorization for Frankey Poot has been approved.    PA# [id number] Effective dates: 03/08/21 through 02/05/22  Patients co-pay is $3078.22.  Oral Oncology Clinic will continue to follow.   [place] Patient [place] Phone [phone] Fax [phone] 03/09/2021 1:42 PM

## 2021-03-10 ENCOUNTER — Telehealth (HOSPITAL_COMMUNITY): Payer: Self-pay | Admitting: Pharmacy Technician

## 2021-03-10 ENCOUNTER — Other Ambulatory Visit (HOSPITAL_COMMUNITY): Payer: Self-pay | Admitting: Nephrology

## 2021-03-10 ENCOUNTER — Other Ambulatory Visit: Payer: Self-pay | Admitting: Nephrology

## 2021-03-10 DIAGNOSIS — D638 Anemia in other chronic diseases classified elsewhere: Secondary | ICD-10-CM

## 2021-03-10 DIAGNOSIS — E1122 Type 2 diabetes mellitus with diabetic chronic kidney disease: Secondary | ICD-10-CM

## 2021-03-10 DIAGNOSIS — I129 Hypertensive chronic kidney disease with stage 1 through stage 4 chronic kidney disease, or unspecified chronic kidney disease: Secondary | ICD-10-CM

## 2021-03-10 DIAGNOSIS — E875 Hyperkalemia: Secondary | ICD-10-CM

## 2021-03-10 NOTE — Telephone Encounter (Signed)
Oral Oncology Patient Advocate Encounter   Was successful in securing patient a $5000 grant from [place] to provide copayment coverage for Lonsurf.  This will keep the out of pocket expense at $0.      The billing information is as follows and has been shared with [facility].   Member ID: [id number] Group ID: [id number] RxBin: [id number] PCN: [id number] Dates of Eligibility: 03/10/21 through 03/10/22  Fund name:  [facility] [place] Patient [place] Phone [phone] Fax [phone] 03/10/2021 2:25 PM

## 2021-03-16 ENCOUNTER — Other Ambulatory Visit (HOSPITAL_COMMUNITY): Payer: Self-pay

## 2021-03-17 ENCOUNTER — Other Ambulatory Visit: Payer: Self-pay

## 2021-03-17 ENCOUNTER — Ambulatory Visit (HOSPITAL_COMMUNITY)
Admission: RE | Admit: 2021-03-17 | Discharge: 2021-03-17 | Disposition: A | Discharge status: Home / Self Care | Payer: Medicare Other | Source: Ambulatory Visit | Attending: Nephrology | Admitting: Nephrology

## 2021-03-17 DIAGNOSIS — D638 Anemia in other chronic diseases classified elsewhere: Secondary | ICD-10-CM

## 2021-03-17 DIAGNOSIS — I129 Hypertensive chronic kidney disease with stage 1 through stage 4 chronic kidney disease, or unspecified chronic kidney disease: Secondary | ICD-10-CM

## 2021-03-17 DIAGNOSIS — E875 Hyperkalemia: Secondary | ICD-10-CM

## 2021-03-17 DIAGNOSIS — E1122 Type 2 diabetes mellitus with diabetic chronic kidney disease: Secondary | ICD-10-CM

## 2021-03-21 ENCOUNTER — Encounter (HOSPITAL_COMMUNITY): Payer: Self-pay | Admitting: Oncology

## 2021-03-21 ENCOUNTER — Telehealth (HOSPITAL_COMMUNITY): Payer: Self-pay | Admitting: Pharmacist

## 2021-03-21 ENCOUNTER — Encounter (HOSPITAL_COMMUNITY): Payer: Self-pay | Admitting: Hematology

## 2021-03-21 ENCOUNTER — Other Ambulatory Visit (HOSPITAL_COMMUNITY): Payer: Self-pay

## 2021-03-21 NOTE — Telephone Encounter (Signed)
Oral Chemotherapy Pharmacist Encounter  [facility] will deliver medication on 03/22/21. He knows the plan is to start the [place] on 03/28/21.  Patient Education I spoke with patient for overview of new oral chemotherapy medication: Lonsurf (trifluridine/tipiracil)  for the treatment of progressive stage IV colon cancer in conjunction with bevacizumab, planned duration until disease progression or unacceptable drug toxicity. Planned start 03/28/21 in conjunction with IV initiation.   Counseled patient on administration, dosing, side effects, monitoring, drug-food interactions, safe handling, storage, and disposal. Patient will take 2 tablets (40 mg of trifluridine total) by mouth 2 (two) times daily after a meal. 1 hr after AM & PM meals on days 1-5, 8-12. Repeat every 28day.  Side effects include but not limited to: N/V, decreased wbc, fatigue, diarrhea.    Reviewed with patient importance of keeping a medication schedule and plan for any missed doses.  After discussion with patient no patient barriers to medication adherence identified.   Mr. Janoski voiced understanding and appreciation. All questions answered. Medication handout and calendar provided.  Provided patient with [facility] phone number. Patient knows to call the office with questions or concerns. Oral Chemotherapy Navigation Clinic will continue to follow.  Darl Pikes, PharmD, BCPS, BCOP, CPP Hematology/Oncology Clinical Pharmacist Practitioner [place]/[facility]/AP [facility] [phone]  03/21/2021 9:28 AM

## 2021-03-21 NOTE — Progress Notes (Signed)
Pharmacist Chemotherapy Monitoring - Initial Assessment    Anticipated start date: 03/28/21   The following has been reviewed per standard work regarding the patient's treatment regimen: The patient's diagnosis, treatment plan and drug doses, and organ/hematologic function Lab orders and baseline tests specific to treatment regimen  The treatment plan start date, drug sequencing, and pre-medications Prior authorization status  Patient's documented medication list, including drug-drug interaction screen and prescriptions for anti-emetics and supportive care specific to the treatment regimen The drug concentrations, fluid compatibility, administration routes, and timing of the medications to be used The patient's access for treatment and lifetime cumulative dose history, if applicable  The patient's medication allergies and previous infusion related reactions, if applicable   Changes made to treatment plan:  treatment plan date  Follow up needed:  N/A   Wynona Neat, [facility], 03/21/2021  2:11 PM

## 2021-03-23 ENCOUNTER — Other Ambulatory Visit (HOSPITAL_COMMUNITY): Payer: Self-pay

## 2021-03-24 ENCOUNTER — Encounter (HOSPITAL_COMMUNITY): Payer: Medicare Other

## 2021-03-25 MED ORDER — PROCHLORPERAZINE MALEATE 10 MG PO TABS: 10.0000 mg | ORAL_TABLET | Freq: Four times a day (QID) | ORAL | 1 refills | Status: DC | PRN

## 2021-03-25 MED ORDER — LIDOCAINE-PRILOCAINE 2.5-2.5 % EX CREA: TOPICAL_CREAM | CUTANEOUS | 3 refills | Status: DC

## 2021-03-28 ENCOUNTER — Other Ambulatory Visit: Payer: Self-pay

## 2021-03-28 ENCOUNTER — Other Ambulatory Visit (HOSPITAL_COMMUNITY): Payer: Self-pay

## 2021-03-28 ENCOUNTER — Inpatient Hospital Stay (HOSPITAL_COMMUNITY): Payer: Medicare Other

## 2021-03-28 ENCOUNTER — Encounter (HOSPITAL_COMMUNITY): Payer: Medicare Other

## 2021-03-28 ENCOUNTER — Inpatient Hospital Stay (HOSPITAL_COMMUNITY): Payer: Medicare Other | Attending: Hematology | Admitting: Hematology

## 2021-03-28 VITALS — BP 160/63 | HR 73 | Temp 97.7°F | Resp 20 | Ht 61.0 in | Wt 244.4 lb

## 2021-03-28 DIAGNOSIS — Z87891 Personal history of nicotine dependence: Secondary | ICD-10-CM | POA: Insufficient documentation

## 2021-03-28 DIAGNOSIS — Z818 Family history of other mental and behavioral disorders: Secondary | ICD-10-CM | POA: Insufficient documentation

## 2021-03-28 DIAGNOSIS — E1122 Type 2 diabetes mellitus with diabetic chronic kidney disease: Secondary | ICD-10-CM | POA: Insufficient documentation

## 2021-03-28 DIAGNOSIS — Z79899 Other long term (current) drug therapy: Secondary | ICD-10-CM | POA: Diagnosis not present

## 2021-03-28 DIAGNOSIS — T451X5A Adverse effect of antineoplastic and immunosuppressive drugs, initial encounter: Secondary | ICD-10-CM | POA: Diagnosis not present

## 2021-03-28 DIAGNOSIS — C7801 Secondary malignant neoplasm of right lung: Secondary | ICD-10-CM | POA: Diagnosis not present

## 2021-03-28 DIAGNOSIS — Z9049 Acquired absence of other specified parts of digestive tract: Secondary | ICD-10-CM | POA: Diagnosis not present

## 2021-03-28 DIAGNOSIS — E875 Hyperkalemia: Secondary | ICD-10-CM | POA: Insufficient documentation

## 2021-03-28 DIAGNOSIS — N189 Chronic kidney disease, unspecified: Secondary | ICD-10-CM | POA: Insufficient documentation

## 2021-03-28 DIAGNOSIS — G620 Drug-induced polyneuropathy: Secondary | ICD-10-CM | POA: Diagnosis not present

## 2021-03-28 DIAGNOSIS — I700 Atherosclerosis of aorta: Secondary | ICD-10-CM | POA: Diagnosis not present

## 2021-03-28 DIAGNOSIS — C7800 Secondary malignant neoplasm of unspecified lung: Secondary | ICD-10-CM | POA: Diagnosis not present

## 2021-03-28 DIAGNOSIS — Z86718 Personal history of other venous thrombosis and embolism: Secondary | ICD-10-CM | POA: Insufficient documentation

## 2021-03-28 DIAGNOSIS — Z8249 Family history of ischemic heart disease and other diseases of the circulatory system: Secondary | ICD-10-CM | POA: Diagnosis not present

## 2021-03-28 DIAGNOSIS — Z95828 Presence of other vascular implants and grafts: Secondary | ICD-10-CM

## 2021-03-28 DIAGNOSIS — C189 Malignant neoplasm of colon, unspecified: Secondary | ICD-10-CM | POA: Insufficient documentation

## 2021-03-28 DIAGNOSIS — Z5112 Encounter for antineoplastic immunotherapy: Secondary | ICD-10-CM | POA: Insufficient documentation

## 2021-03-28 DIAGNOSIS — J439 Emphysema, unspecified: Secondary | ICD-10-CM | POA: Diagnosis not present

## 2021-03-28 DIAGNOSIS — J90 Pleural effusion, not elsewhere classified: Secondary | ICD-10-CM | POA: Insufficient documentation

## 2021-03-28 LAB — COMPREHENSIVE METABOLIC PANEL
ALT: 14 U/L (ref 0–44)
AST: 21 U/L (ref 15–41)
Albumin: 3 g/dL — ABNORMAL LOW (ref 3.5–5.0)
Alkaline Phosphatase: 80 U/L (ref 38–126)
Anion gap: 8 (ref 5–15)
BUN: 31 mg/dL — ABNORMAL HIGH (ref 8–23)
CO2: 24 mmol/L (ref 22–32)
Calcium: 8.5 mg/dL — ABNORMAL LOW (ref 8.9–10.3)
Chloride: 105 mmol/L (ref 98–111)
Creatinine, Ser: 1.22 mg/dL (ref 0.61–1.24)
GFR, Estimated: >60 mL/min (ref >60)
Glucose, Bld: 142 mg/dL — ABNORMAL HIGH (ref 70–99)
Potassium: 4.7 mmol/L (ref 3.5–5.1)
Sodium: 137 mmol/L (ref 135–145)
Total Bilirubin: 0.6 mg/dL (ref 0.3–1.2)
Total Protein: 6.7 g/dL (ref 6.5–8.1)

## 2021-03-28 LAB — CBC WITH DIFFERENTIAL/PLATELET
Abs Immature Granulocytes: 0.03 10*3/uL (ref 0.00–0.07)
Basophils Absolute: 0.1 10*3/uL (ref 0.0–0.1)
Basophils Relative: 1 %
Eosinophils Absolute: 0.3 10*3/uL (ref 0.0–0.5)
Eosinophils Relative: 3 %
HCT: 33.6 % — ABNORMAL LOW (ref 39.0–52.0)
Hemoglobin: 10.5 g/dL — ABNORMAL LOW (ref 13.0–17.0)
Immature Granulocytes: 0 %
Lymphocytes Relative: 14 %
Lymphs Abs: 1.2 10*3/uL (ref 0.7–4.0)
MCH: 30.1 pg (ref 26.0–34.0)
MCHC: 31.3 g/dL (ref 30.0–36.0)
MCV: 96.3 fL (ref 80.0–100.0)
Monocytes Absolute: 0.9 10*3/uL (ref 0.1–1.0)
Monocytes Relative: 11 %
Neutro Abs: 6.1 10*3/uL (ref 1.7–7.7)
Neutrophils Relative %: 71 %
Platelets: 263 10*3/uL (ref 150–400)
RBC: 3.49 MIL/uL — ABNORMAL LOW (ref 4.22–5.81)
RDW: 13.2 % (ref 11.5–15.5)
WBC: 8.7 10*3/uL (ref 4.0–10.5)
nRBC: 0 % (ref 0.0–0.2)

## 2021-03-28 LAB — MAGNESIUM: Magnesium: 2 mg/dL (ref 1.7–2.4)

## 2021-03-28 MED ORDER — SODIUM CHLORIDE 0.9 % IV SOLN
5.0000 mg/kg | Freq: Once | INTRAVENOUS | Status: AC
  Administered 2021-03-28: 600 mg via INTRAVENOUS
  Filled 2021-03-28: qty 16

## 2021-03-28 MED ORDER — SODIUM CHLORIDE 0.9 % IV SOLN: Freq: Once | INTRAVENOUS | Status: AC

## 2021-03-28 MED ORDER — HEPARIN SOD (PORK) LOCK FLUSH 100 UNIT/ML IV SOLN
500.0000 [IU] | Freq: Once | INTRAVENOUS | Status: AC
  Administered 2021-03-28: 500 [IU] via INTRAVENOUS

## 2021-03-28 MED ORDER — ALTEPLASE 2 MG IJ SOLR
2.0000 mg | Freq: Once | INTRAMUSCULAR | Status: AC
  Administered 2021-03-28: 2 mg
  Filled 2021-03-28: qty 2

## 2021-03-28 MED ORDER — SODIUM CHLORIDE 0.9% FLUSH
10.0000 mL | INTRAVENOUS | Status: DC | PRN
  Administered 2021-03-28: 10 mL

## 2021-03-28 NOTE — Patient Instructions (Signed)
[place]  Discharge Instructions: Thank you for choosing [facility] to provide your oncology and hematology care.  If you have a lab appointment with the [facility], please come in thru the Main Entrance and check in at the main information desk.  Wear comfortable clothing and clothing appropriate for easy access to any Portacath or PICC line.   We strive to give you quality time with your provider. You may need to reschedule your appointment if you arrive late (15 or more minutes).  Arriving late affects you and other patients whose appointments are after yours.  Also, if you miss three or more appointments without notifying the office, you may be dismissed from the clinic at the providers discretion.      For prescription refill requests, have your pharmacy contact our office and allow 72 hours for refills to be completed.    Today you received the following chemotherapy and/or immunotherapy agents Avastin.       To help prevent nausea and vomiting after your treatment, we encourage you to take your nausea medication as directed.  BELOW ARE SYMPTOMS THAT SHOULD BE REPORTED IMMEDIATELY: *FEVER GREATER THAN 100.4 F (38 C) OR HIGHER *CHILLS OR SWEATING *NAUSEA AND VOMITING THAT IS NOT CONTROLLED WITH YOUR NAUSEA MEDICATION *UNUSUAL SHORTNESS OF BREATH *UNUSUAL BRUISING OR BLEEDING *URINARY PROBLEMS (pain or burning when urinating, or frequent urination) *BOWEL PROBLEMS (unusual diarrhea, constipation, pain near the anus) TENDERNESS IN MOUTH AND THROAT WITH OR WITHOUT PRESENCE OF ULCERS (sore throat, sores in mouth, or a toothache) UNUSUAL RASH, SWELLING OR PAIN  UNUSUAL VAGINAL DISCHARGE OR ITCHING   Items with * indicate a potential emergency and should be followed up as soon as possible or go to the Emergency Department if any problems should occur.  Please show the CHEMOTHERAPY ALERT CARD or IMMUNOTHERAPY ALERT CARD at check-in to the Emergency  Department and triage nurse.  Should you have questions after your visit or need to cancel or reschedule your appointment, please contact [facility] [phone]  and follow the prompts.  Office hours are 8:00 a.m. to 4:30 p.m. Monday - Friday. Please note that voicemails left after 4:00 p.m. may not be returned until the following business day.  We are closed weekends and major holidays. You have access to a nurse at all times for urgent questions. Please call the main number to the clinic [phone] and follow the prompts.  For any non-urgent questions, you may also contact your provider using MyChart. We now offer e-Visits for anyone 23 and older to request care online for non-urgent symptoms. For details visit [email or web address].   Also download the MyChart app! Go to the app store, search "MyChart", open the app, select [facility], and log in with your MyChart username and password.  Due to Covid, a mask is required upon entering the hospital/clinic. If you do not have a mask, one will be given to you upon arrival. For doctor visits, patients may have 1 support person aged 49 or older with them. For treatment visits, patients cannot have anyone with them due to current Covid guidelines and our immunocompromised population.

## 2021-03-28 NOTE — Patient Instructions (Signed)
[place] at [facility] Discharge Instructions   You were seen and examined today by Dr. Delton Coombes.  He reviewed your lab work which is normal/stable.   We will proceed with your treatment today.  We will get a dye study done for your port prior to next treatment so we can use your port for treatment.   Return as scheduled for lab work, office visit, and treatment.    Thank you for choosing [place] at [facility] to provide your oncology and hematology care.  To afford each patient quality time with our provider, please arrive at least 15 minutes before your scheduled appointment time.   If you have a lab appointment with the [facility] please come in thru the Main Entrance and check in at the main information desk.  You need to re-schedule your appointment should you arrive 10 or more minutes late.  We strive to give you quality time with our providers, and arriving late affects you and other patients whose appointments are after yours.  Also, if you no show three or more times for appointments you may be dismissed from the clinic at the providers discretion.     Again, thank you for choosing [facility].  Our hope is that these requests will decrease the amount of time that you wait before being seen by our physicians.       _____________________________________________________________  Should you have questions after your visit to [facility], please contact our office at [phone] and follow the prompts.  Our office hours are 8:00 a.m. and 4:30 p.m. Monday - Friday.  Please note that voicemails left after 4:00 p.m. may not be returned until the following business day.  We are closed weekends and major holidays.  You do have access to a nurse 24-7, just call the main number to the clinic [phone] and do not press any options, hold on the line and a nurse will answer the phone.    For prescription  refill requests, have your pharmacy contact our office and allow 72 hours.    Due to Covid, you will need to wear a mask upon entering the hospital. If you do not have a mask, a mask will be given to you at the Main Entrance upon arrival. For doctor visits, patients may have 1 support person age 97 or older with them. For treatment visits, patients can not have anyone with them due to social distancing guidelines and our immunocompromised population.

## 2021-03-28 NOTE — Progress Notes (Signed)
Ok to use peripheral IV for today's treatment verbal order Dr. Delton Coombes.   Consent signed with information given for Avastin.  Copy given of consent.   Patient tolerated therapy with no complaints voiced.  Side effects with management reviewed with understanding verbalized.  Peripheral IV site clean and dry with no bruising or swelling noted at site.  Good blood return noted before and after administration of therapy.  Band aid applied.  Patient left in satisfactory condition with VSS and no s/s of distress noted.

## 2021-03-28 NOTE — Progress Notes (Signed)
[facility] [place], [place] [address]   CLINIC:  Medical Oncology/Hematology  PCP:  Ledell Noss, [place] / [place] [address] [phone]   REASON FOR VISIT:  Follow-up for metastatic colon cancer  PRIOR THERAPY: none   NGS Results: not done  CURRENT THERAPY: Stivarga 80 mg daily started on 09/25/2020  BRIEF ONCOLOGIC HISTORY:  Oncology History  Adenocarcinoma of colon ([place])  10/04/2012 Definitive Surgery   Partial collectomy by Dr. [name]   10/04/2012 Pathology Results   4.5 cm poorly differentiated adenocarcinoma, negative margins, 0/7 lymph nodes for metastatic disease.  Oncotype recurrence score of 26 (high)    Chemotherapy   FOLFOX    Adverse Reaction   Progressive peripheral neuropathy    Chemotherapy   Infusional 5 FU.  Completed 6 months worth of adjuvant therapy.   02/23/2014 Procedure   Colonoscopy by Dr. Anthony Sar.   07/01/2015 PET scan   There is malignant range uptake with RLL pulmonary nodule suspicious for metastatic disease or primary pulm neoplasm. Nonspecific focus of uptake in the L femoral neck. No corresponding CT abnormality. Cannot rule out metastasis.   07/06/2015 Imaging   MR C-spine- C5-6 there is a broad-based disc bulge w R paracentral disc protrusion deforming the spinal cord. Moderate R foraminal stenosis. C6-7 there is a central disc protrusion slightly eccentric towards the R and mildly deforming the ventral c-spine    09/16/2015 Procedure   RLL needle biopsy by IR.   09/17/2015 Pathology Results   Metastatic adenocarcinoma consistent with a primary colorectal adenocarcinoma.   10/12/2015 - 10/22/2015 Radiation Therapy   SBRT by Dr. [name] to RLL pulmonary lesion (biopsy proven to be oligometastasis).   12/09/2015 Imaging   CT chest- 1. Slight interval decrease in size of the right lower lobe pulmonary nodule. Some surrounding radiation changes. 2. No mediastinal or hilar mass or  adenopathy. 3. No new pulmonary nodules. 4. Stable underline emphysematous changes. 5. Cirrhotic changes involving the liver but no upper abdominal metastatic disease.   03/14/2016 Imaging   CT chest- 1. Continued mild reduction in the size of the treated right lobe pulmonary nodule. 2. Increased patchy consolidation, reticulation and ground-glass attenuation in the basilar right lower lobe, nonspecific, favor evolving postradiation change, recommend attention on follow-up chest CT. 3. No evidence of new or progressive metastatic disease in the chest. 4. Aortic atherosclerosis. Left main and 3 vessel coronary atherosclerosis. 5. Mild emphysema.   06/30/2016 Imaging   CT CAP- 1. The appearance of the treated right lower lobe nodule is essentially unchanged, and there are evolving postradiation changes in the base of the right lower lobe, as above. No definite signs of new metastatic disease are noted elsewhere in the chest, abdomen or pelvis. 2. Aortic atherosclerosis, in addition to left main and 3 vessel coronary artery disease. Assessment for potential risk factor modification, dietary therapy or pharmacologic therapy may be warranted, if clinically indicated. 3. Morphologic changes in the liver suggestive of cirrhosis, as above. 4. Additional incidental findings, similar prior studies, as above.   11/17/2016 Imaging   CT CAP Study Result   CLINICAL DATA:  Stage IV colon cancer originally diagnosed in August 2014 status post partial colectomy, with right lower lobe lung metastasis diagnosed August 2017 status post SBRT completed 10/22/2015. Restaging.   EXAM: CT CHEST, ABDOMEN, AND PELVIS WITH CONTRAST   TECHNIQUE: Multidetector CT imaging of the chest, abdomen and pelvis was performed following the standard protocol during  bolus administration of intravenous contrast.   CONTRAST:  196m ISOVUE-300 IOPAMIDOL (ISOVUE-300) INJECTION 61%   COMPARISON:  06/30/2016 CT  chest, abdomen and pelvis.   FINDINGS: CT CHEST FINDINGS   Cardiovascular: Normal heart size. No significant pericardial fluid/thickening. Left main, left anterior descending, left circumflex and right coronary atherosclerosis. Atherosclerotic nonaneurysmal thoracic aorta. Normal caliber pulmonary arteries. No central pulmonary emboli. Right internal jugular MediPort terminates at the junction of the right and left brachiocephalic veins.   Mediastinum/Nodes: No discrete thyroid nodules. Unremarkable esophagus. No pathologically enlarged axillary, mediastinal or hilar lymph nodes.   Lungs/Pleura: No pneumothorax. No pleural effusion. There is a nodular 2.7 x 1.6 cm focus of consolidation in the medial basilar right lower lobe (series 3/ image 108), previously 1.6 x 1.1 cm. No appreciable change in patchy subpleural reticulation in both lungs without significant traction bronchiectasis or frank honeycombing. Mild centrilobular and paraseptal emphysema with mild diffuse bronchial wall thickening. No acute consolidative airspace disease or new significant pulmonary nodules.   Musculoskeletal: No aggressive appearing focal osseous lesions. Moderate thoracic spondylosis. Stable chronic left scapular deformity.   CT ABDOMEN PELVIS FINDINGS   Hepatobiliary: Normal liver with no liver mass. Cholecystectomy. No biliary ductal dilatation.   Pancreas: Normal, with no mass or duct dilation.   Spleen: Normal size. No mass.   Adrenals/Urinary Tract: Normal adrenals. No hydronephrosis. No renal masses. Normal bladder.   Stomach/Bowel: Grossly normal stomach. Normal caliber small bowel with no small bowel wall thickening. Stable appearance of the appendix, within normal limits. Stable postsurgical changes from partial distal colectomy with intact appearing distal colonic anastomosis. Oral contrast transits to the cecum. No large bowel wall thickening or new pericholecystic fat stranding  .   Vascular/Lymphatic: Atherosclerotic nonaneurysmal abdominal aorta. Patent portal, splenic, hepatic and renal veins. No pathologically enlarged lymph nodes in the abdomen or pelvis.   Reproductive:  Normal size prostate.   Other: No pneumoperitoneum, ascites or focal fluid collection. Stable subcutaneous calcified granulomas throughout the left lower back and gluteal regions.   Musculoskeletal: No aggressive appearing focal osseous lesions. Marked lumbar spondylosis.   IMPRESSION: 1. Nodular focus of consolidation in the medial basilar right lower lobe is increased in size. While this may represent evolving masslike radiation fibrosis, recurrence of the lung metastasis is not excluded. PET-CT would be useful for further evaluation. Otherwise, continued close chest CT surveillance is advised . 2. No additional potential new sites of metastatic disease in the chest, abdomen or pelvis. 3. No evidence of local tumor recurrence at the distal colonic anastomosis. 4. Left main and 3 vessel coronary atherosclerosis . 5. Aortic Atherosclerosis (ICD10-I70.0) and Emphysema (ICD10-J43.9).      11/30/2016 Relapse/Recurrence   PET: IMPRESSION: 1. Enlarging right lower lobe pulmonary nodule and slight increase in the SUV max suggesting residual neoplasm. No new pulmonary lesions. 2. No findings for local recurrence or abdominal/pelvic metastatic disease.   01/02/2017 - 01/12/2017 Radiation Therapy    The patient saw Dr. MLisbeth Renshawfor SBRT radiation treatment. The tumor in the right lung was treated with a course of stereotactic body radiation treatment. The patient received 50 Gy In 5 fractions at 10 Gy per fraction.      10/05/2017 - 10/05/2017 Chemotherapy   The patient had bevacizumab (AVASTIN) 900 mg in sodium chloride 0.9 % 100 mL chemo infusion, 7.3 mg/kg = 925 mg, Intravenous,  Once, 1 of 3 cycles Administration: 900 mg (10/05/2017)   for chemotherapy treatment.     [place]  [place]astasis [place]  10/20/2015  Initial Diagnosis   Lung metastasis ([place])   03/28/2021 -  Chemotherapy   Patient is on Treatment Plan :  COLORECTAL Bevacizumab + Trifluridine/Tipiracil q28d     Colon cancer ([place])  08/24/2020 Initial Diagnosis   Colon cancer ([place])   03/28/2021 -  Chemotherapy   Patient is on Treatment Plan :  COLORECTAL Bevacizumab + Trifluridine/Tipiracil q28d       CANCER STAGING:  Cancer Staging  Adenocarcinoma of colon [facility]) Staging form: Colon and Rectum, AJCC 7th Edition - Clinical stage from 10/04/2012: Stage IIA (T3, N0, M0) - Signed by Baird Cancer, PA-C on 07/15/2015 - Pathologic stage from 09/17/2015: Stage IVA (T3, N0, M1a) - Signed by Baird Cancer, PA-C on 10/05/2015   INTERVAL HISTORY:  Gary Nelson, a 81 y.o. male, returns for routine follow-up and consideration for next cycle of chemotherapy. Gary Nelson was last seen on 03/03/2021.  Due for cycle #1 of Bevacizumab + Trifluridine today.   Overall, he tells me he has been feeling pretty well.   Overall, he feels ready for next cycle of chemo today.   REVIEW OF SYSTEMS:  Review of Systems  Constitutional:  Negative for appetite change and fatigue.  Respiratory:  Positive for shortness of breath.   Genitourinary:  Positive for bladder incontinence.   Musculoskeletal:  Positive for arthralgias (5/10 hips).  All other systems reviewed and are negative.  PAST MEDICAL/SURGICAL HISTORY:  Past Medical History:  Diagnosis Date   Adenocarcinoma of colon ([place])    RLL -  Pulmonary nodule (06/2015, concerning for mets vs primary; to see RAD-ONC Dr. [name]); stage 2 adenocarinoma   2014 - Colon Cancer - surgery and chemo   Blood infection    Cirrhosis of liver ([place]) 07/28/2019   Cystitis    Diabetes mellitus without complication ([place])    Type II   DVT (deep venous thrombosis) ([place]) 2014   post op   Hematuria    Hypertension    Iron deficiency 07/31/2019   Neuropathy    Portal hypertension ([place])  07/28/2019   Prostatitis    Pulmonary nodule    Sleep apnea    Past Surgical History:  Procedure Laterality Date   BACK SURGERY     CATARACT EXTRACTION     left   CATARACT EXTRACTION W/PHACO Right 04/11/2012   Procedure: CATARACT EXTRACTION PHACO AND INTRAOCULAR LENS PLACEMENT ([place]);  Surgeon: Tonny Branch, MD;  Location: [facility];  Service: Ophthalmology;  Laterality: Right;  CDE:62.48   CERVICAL FUSION     c4-c5   CHOLECYSTECTOMY     COLON SURGERY  09/29/12   Colon resection for cancer   COLONOSCOPY     EYE SURGERY     cataract   GIVENS CAPSULE STUDY N/A 03/15/2015   Procedure: GIVENS CAPSULE STUDY;  Surgeon: Rogene Houston, MD;  Location: [facility] SUITE;  Service: Endoscopy;  Laterality: N/A;  730   LEG SURGERY     ORIF FEMUR FRACTURE     right   POSTERIOR CERVICAL FUSION/FORAMINOTOMY N/A 07/27/2015   Procedure: Cervical four to Cervical seven Laminectomy with Cervical four to Thoracic one Dorsal internal fixation and fusion;  Surgeon: Kevan Ny Ditty, MD;  Location: [facility];  Service: Neurosurgery;  Laterality: N/A;  C4 to C7 Laminectomy with C4 to T1 Dorsal internal fixation and fusion    SOCIAL HISTORY:  Social History   Socioeconomic History   Marital status: Single    Spouse name: Not on file   Number  of children: Not on file   Years of education: Not on file   Highest education level: Not on file  Occupational History   Not on file  Tobacco Use   Smoking status: Former    Packs/day: 1.00    Years: 55.00    Pack years: 55.00    Types: Cigarettes    Start date: 02/07/1955   Smokeless tobacco: Never   Tobacco comments:    quit 2015  Substance and Sexual Activity   Alcohol use: Not Currently    Alcohol/week: 3.0 standard drinks    Types: 3 Cans of beer per week   Drug use: No   Sexual activity: Yes    Birth control/protection: None  Other Topics Concern   Not on file  Social History Narrative   Not on file   Social Determinants of Health   Financial  Resource Strain: Not on file  Food Insecurity: Not on file  Transportation Needs: Not on file  Physical Activity: Not on file  Stress: Not on file  Social Connections: Not on file  Intimate Partner Violence: Not on file    FAMILY HISTORY:  Family History  Problem Relation Age of Onset   Heart disease Father    Heart attack Father    Alzheimer's disease Mother     CURRENT MEDICATIONS:  Current Outpatient Medications  Medication Sig Dispense Refill   aspirin EC 81 MG tablet Take 81 mg by mouth daily.     atorvastatin (LIPITOR) 10 MG tablet Take 10 mg by mouth daily at 6 PM.      [employer] B-12 5000 MCG SUBL Place 1 tablet (5,000 mcg total) under the tongue daily. 30 tablet 6   diphenoxylate-atropine (LOMOTIL) 2.5-0.025 MG tablet Take 1 tablet by mouth 4 (four) times daily as needed for diarrhea or loose stools. 60 tablet 0   furosemide (LASIX) 20 MG tablet Take 20 mg by mouth every other day.      gabapentin (NEURONTIN) 300 MG capsule Take 2 capsules (600 mg total) by mouth 3 (three) times daily. (Patient taking differently: Take 600 mg by mouth 2 (two) times daily.) 180 capsule 2   insulin NPH (HUMULIN N,NOVOLIN N) 100 UNIT/ML injection Inject 15-30 Units into the skin See admin instructions. Per pt, 5 units every morning and 5 units in the evening     ketoconazole (NIZORAL) 2 % cream Apply topically as needed.     lidocaine-prilocaine (EMLA) cream Apply to affected area once 30 g 3   lisinopril (PRINIVIL,ZESTRIL) 40 MG tablet Take 40 mg by mouth daily.     naproxen (NAPROSYN) 500 MG tablet TAKE 1 TABLET BY MOUTH TWICE A DAY WITH MEALS     OXYGEN Inhale 2 L into the lungs continuous. At night time only     phenylephrine-shark liver oil-mineral oil-petrolatum (PREPARATION H) 0.25-14-74.9 % rectal ointment Place 1 application rectally as needed for hemorrhoids.     prochlorperazine (COMPAZINE) 10 MG tablet Take 1 tablet (10 mg total) by mouth every 6 (six) hours as needed (Nausea or  vomiting). 30 tablet 1   silver sulfADIAZINE (SILVADENE) 1 % cream Apply 1 application topically as needed.     sucralfate (CARAFATE) 1 GM/10ML suspension SWISH AND SWALLOW 15ML FOUR TIMES DAILY     tamsulosin (FLOMAX) 0.4 MG CAPS capsule Take 1 capsule by mouth daily.     trifluridine-tipiracil (LONSURF) 20-8.19 MG tablet Take 2 tablets (40 mg of trifluridine total) by mouth 2 (two) times daily after a meal.  1 hr after AM & PM meals on days 1-5, 8-12. Repeat every 28day 40 tablet 0   No current facility-administered medications for this visit.    ALLERGIES:  No Known Allergies  PHYSICAL EXAM:  Performance status (ECOG): 2 - Symptomatic, <50% confined to bed  There were no vitals filed for this visit. Wt Readings from Last 3 Encounters:  03/03/21 248 lb 6.4 oz (112.7 kg)  01/13/21 242 lb 15.2 oz (110.2 kg)  01/06/21 248 lb (112.5 kg)   Physical Exam Vitals reviewed.  Constitutional:      Appearance: Normal appearance. He is obese.  Cardiovascular:     Rate and Rhythm: Normal rate and regular rhythm.     Pulses: Normal pulses.     Heart sounds: Normal heart sounds.  Pulmonary:     Effort: Pulmonary effort is normal.     Breath sounds: Normal breath sounds.  Neurological:     General: No focal deficit present.     Mental Status: He is alert and oriented to person, place, and time.  Psychiatric:        Mood and Affect: Mood normal.        Behavior: Behavior normal.    LABORATORY DATA:  I have reviewed the labs as listed.  CBC Latest Ref Rng & Units 03/28/2021 03/03/2021 01/13/2021  WBC 4.0 - 10.5 K/uL 8.7 7.2 9.6  Hemoglobin 13.0 - 17.0 g/dL 10.5(L) 10.9(L) 10.0(L)  Hematocrit 39.0 - 52.0 % 33.6(L) 33.7(L) 31.9(L)  Platelets 150 - 400 K/uL 263 192 210   CMP Latest Ref Rng & Units 03/28/2021 03/03/2021 01/13/2021  Glucose 70 - 99 mg/dL 142(H) 149(H) 112(H)  BUN 8 - 23 mg/dL 31(H) 44(H) 36(H)  Creatinine 0.61 - 1.24 mg/dL 1.22 1.45(H) 1.22  Sodium 135 - 145 mmol/L 137 136 137   Potassium 3.5 - 5.1 mmol/L 4.7 6.4(HH) 5.5(H)  Chloride 98 - 111 mmol/L 105 107 110  CO2 22 - 32 mmol/L _0 Calcium 8.9 - 10.3 mg/dL 8.5(L) 8.5(L) 8.1(L)  Total Protein 6.5 - 8.1 g/dL 6.7 6.7 6.1(L)  Total Bilirubin 0.3 - 1.2 mg/dL 0.6 0.2(L) 0.4  Alkaline Phos 38 - 126 U/L 80 69 63  AST 15 - 41 U/L _1 ALT 0 - 44 U/L _2 DIAGNOSTIC IMAGING:  I have independently reviewed the scans and discussed with the patient. US RENAL  Result Date: 03/19/2021 CLINICAL DATA:  Chronic kidney disease EXAM: RENAL / URINARY TRACT ULTRASOUND COMPLETE COMPARISON:  CT 08/02/2020 FINDINGS: Right Kidney: Renal measurements: 10.9 x 5.6 x 4.8 cm = volume: 154 mL. Echogenicity within normal limits. No mass or hydronephrosis visualized. Left Kidney: Renal measurements: 10.9 x 5.9 x 5.8 cm = volume: 193 mL. Echogenicity within normal limits. No mass or hydronephrosis visualized. Bladder: Decompressed Other: None. IMPRESSION: No acute findings.  No hydronephrosis. Electronically Signed   By: Rolm Baptise M.D.   On: 03/19/2021 10:03     ASSESSMENT:  1.  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 - Oligometastasis with right lower lobe pulmonary nodule, status post SBRT from 10/12/2015 through 10/22/2015 by Dr. [name]. - Retreatment with SBRT to the right lung from 01/02/2017 through 01/12/2017. - Xeloda 2 weeks on/1 week off with 1 dose of Avastin on 10/05/2017, discontinued secondary to extensive rash in the groin and buttock region. -  Stivarga 80 mg 3 weeks on/1 week off from February 2020 through 12/17/2018. -Stivarga discontinued because of unhealed wounds in the groin.  They have healed up after discontinuation. - Stivarga 80 mg 3 weeks on/1 week off from 09/25/2020 through 01/13/2021 with progression in the right lower lobe lung mass. - RAS/RAF panel on  09/06/2017 shows BRAF [id number] mutation.  K-ras and NRAS negative. - PET scan on 01/06/2021 showed slight interval increase in size of the right lower lobe pulmonary mass which remains markedly hypermetabolic.  No new sites of metabolic him in the neck, chest, abdomen or pelvis.  Small right pleural effusion. - He was evaluated by Dr. [name] in radiation oncology.  He was not felt to be a candidate for retreatment with radiation. - We have also reached out to Dr. Kathlene Cote who did not feel ablation is feasible due to the size of the lesion being more than 3 cm.   2.  Chronic ulcers: -He had developed ulcers in the right upper thigh which completely healed since we stopped Stivarga.   PLAN:  1.  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 - We have reviewed side effects of Lonsurf. - We have reviewed his labs today which shows normal LFTs and CBC.  Last CEA was 12.1. - His port does not give blood return although it flushes easily.  We will obtain a portogram prior to start using it.  We will schedule a portogram at next visit.  Today he will receive bevacizumab through peripheral IV.  Repeat blood pressure today is 160/63. - He will proceed with Avastin now.  He will continue Lonsurf as prescribed. - Reevaluate in 2 weeks prior to next bevacizumab treatment.  2.  Hyperkalemia: - He was evaluated by Dr. Theador Hawthorne.  Lisinopril and NSAIDs were held. -Today potassium is normal at 4.7.   Orders placed this encounter:  Orders Placed This Encounter  Procedures   Urinalysis, dipstick only     Derek Jack, MD [place] [phone]   I, Thana Ates, am acting as a scribe for Dr. Derek Jack.  I, Derek Jack MD, have reviewed the above documentation for accuracy and completeness, and I agree with the above.

## 2021-03-28 NOTE — Progress Notes (Signed)
Patient is taking Lonsurf as prescribed.  He has not missed any doses and reports no side effects at this time.    Patient has been examined by Dr. Delton Coombes, and vital signs and labs have been reviewed. ANC, Creatinine, LFTs, hemoglobin, and platelets are within treatment parameters per M.D. - pt may proceed with treatment.

## 2021-03-29 ENCOUNTER — Encounter (HOSPITAL_COMMUNITY): Payer: Self-pay | Admitting: *Deleted

## 2021-03-29 LAB — CEA: CEA: 11.8 ng/mL — ABNORMAL HIGH (ref 0.0–4.7)

## 2021-03-29 NOTE — Progress Notes (Signed)
24 hr follow-up chemotherapy administration call back:   I contacted patient today via telephone. He reports that he is feeling well.  He denies any side effects.  He denies having any questions that weren't already addressed.  Patient states that he is taking his oral chemotherapy without any side effects and hasn't missed any doses.  Nothing further needed from our team at this time. He was encouraged to call us at any time should he have any issues.  He verbalizes understanding.

## 2021-04-05 ENCOUNTER — Other Ambulatory Visit (HOSPITAL_COMMUNITY): Payer: Self-pay

## 2021-04-11 ENCOUNTER — Ambulatory Visit (HOSPITAL_COMMUNITY): Payer: Medicare Other

## 2021-04-11 ENCOUNTER — Other Ambulatory Visit (HOSPITAL_COMMUNITY): Payer: Medicare Other

## 2021-04-12 ENCOUNTER — Encounter (HOSPITAL_COMMUNITY): Payer: Self-pay

## 2021-04-12 ENCOUNTER — Inpatient Hospital Stay (HOSPITAL_COMMUNITY): Payer: Medicare Other

## 2021-04-12 ENCOUNTER — Other Ambulatory Visit: Payer: Self-pay

## 2021-04-12 ENCOUNTER — Encounter (HOSPITAL_COMMUNITY): Payer: Self-pay | Admitting: *Deleted

## 2021-04-12 ENCOUNTER — Ambulatory Visit (HOSPITAL_COMMUNITY)
Admission: RE | Admit: 2021-04-12 | Discharge: 2021-04-12 | Disposition: A | Discharge status: Home / Self Care | Payer: Medicare Other | Source: Ambulatory Visit | Attending: Hematology | Admitting: Hematology

## 2021-04-12 ENCOUNTER — Inpatient Hospital Stay (HOSPITAL_COMMUNITY): Payer: Medicare Other | Attending: Hematology

## 2021-04-12 ENCOUNTER — Inpatient Hospital Stay (HOSPITAL_BASED_OUTPATIENT_CLINIC_OR_DEPARTMENT_OTHER): Payer: Medicare Other | Admitting: Hematology

## 2021-04-12 DIAGNOSIS — Z79899 Other long term (current) drug therapy: Secondary | ICD-10-CM | POA: Diagnosis not present

## 2021-04-12 DIAGNOSIS — Z5112 Encounter for antineoplastic immunotherapy: Secondary | ICD-10-CM | POA: Diagnosis not present

## 2021-04-12 DIAGNOSIS — C189 Malignant neoplasm of colon, unspecified: Secondary | ICD-10-CM

## 2021-04-12 DIAGNOSIS — R197 Diarrhea, unspecified: Secondary | ICD-10-CM | POA: Diagnosis not present

## 2021-04-12 DIAGNOSIS — R319 Hematuria, unspecified: Secondary | ICD-10-CM | POA: Diagnosis not present

## 2021-04-12 DIAGNOSIS — Z95828 Presence of other vascular implants and grafts: Secondary | ICD-10-CM | POA: Diagnosis present

## 2021-04-12 DIAGNOSIS — K7460 Unspecified cirrhosis of liver: Secondary | ICD-10-CM | POA: Diagnosis not present

## 2021-04-12 DIAGNOSIS — C7801 Secondary malignant neoplasm of right lung: Secondary | ICD-10-CM | POA: Insufficient documentation

## 2021-04-12 DIAGNOSIS — Z8249 Family history of ischemic heart disease and other diseases of the circulatory system: Secondary | ICD-10-CM | POA: Diagnosis not present

## 2021-04-12 DIAGNOSIS — I700 Atherosclerosis of aorta: Secondary | ICD-10-CM | POA: Insufficient documentation

## 2021-04-12 DIAGNOSIS — R0602 Shortness of breath: Secondary | ICD-10-CM | POA: Insufficient documentation

## 2021-04-12 DIAGNOSIS — M2550 Pain in unspecified joint: Secondary | ICD-10-CM | POA: Diagnosis not present

## 2021-04-12 DIAGNOSIS — Z9049 Acquired absence of other specified parts of digestive tract: Secondary | ICD-10-CM | POA: Diagnosis not present

## 2021-04-12 DIAGNOSIS — Z86718 Personal history of other venous thrombosis and embolism: Secondary | ICD-10-CM | POA: Insufficient documentation

## 2021-04-12 DIAGNOSIS — K5900 Constipation, unspecified: Secondary | ICD-10-CM | POA: Diagnosis not present

## 2021-04-12 DIAGNOSIS — E875 Hyperkalemia: Secondary | ICD-10-CM | POA: Diagnosis not present

## 2021-04-12 DIAGNOSIS — N189 Chronic kidney disease, unspecified: Secondary | ICD-10-CM | POA: Insufficient documentation

## 2021-04-12 DIAGNOSIS — Z7982 Long term (current) use of aspirin: Secondary | ICD-10-CM | POA: Diagnosis not present

## 2021-04-12 DIAGNOSIS — R5383 Other fatigue: Secondary | ICD-10-CM | POA: Insufficient documentation

## 2021-04-12 DIAGNOSIS — J90 Pleural effusion, not elsewhere classified: Secondary | ICD-10-CM | POA: Diagnosis not present

## 2021-04-12 DIAGNOSIS — I129 Hypertensive chronic kidney disease with stage 1 through stage 4 chronic kidney disease, or unspecified chronic kidney disease: Secondary | ICD-10-CM | POA: Insufficient documentation

## 2021-04-12 DIAGNOSIS — C19 Malignant neoplasm of rectosigmoid junction: Secondary | ICD-10-CM | POA: Diagnosis present

## 2021-04-12 DIAGNOSIS — Z818 Family history of other mental and behavioral disorders: Secondary | ICD-10-CM | POA: Diagnosis not present

## 2021-04-12 DIAGNOSIS — R200 Anesthesia of skin: Secondary | ICD-10-CM | POA: Diagnosis not present

## 2021-04-12 DIAGNOSIS — R059 Cough, unspecified: Secondary | ICD-10-CM | POA: Diagnosis not present

## 2021-04-12 DIAGNOSIS — E1122 Type 2 diabetes mellitus with diabetic chronic kidney disease: Secondary | ICD-10-CM | POA: Insufficient documentation

## 2021-04-12 LAB — URINALYSIS, DIPSTICK ONLY
Bilirubin Urine: NEGATIVE
Glucose, UA: NEGATIVE mg/dL
Hgb urine dipstick: NEGATIVE
Ketones, ur: NEGATIVE mg/dL
Nitrite: NEGATIVE
Protein, ur: >=300 mg/dL — AB
Specific Gravity, Urine: 1.011 (ref 1.005–1.030)
pH: 9 — ABNORMAL HIGH (ref 5.0–8.0)

## 2021-04-12 LAB — COMPREHENSIVE METABOLIC PANEL
ALT: 14 U/L (ref 0–44)
AST: 20 U/L (ref 15–41)
Albumin: 2.9 g/dL — ABNORMAL LOW (ref 3.5–5.0)
Alkaline Phosphatase: 75 U/L (ref 38–126)
Anion gap: 7 (ref 5–15)
BUN: 27 mg/dL — ABNORMAL HIGH (ref 8–23)
CO2: 25 mmol/L (ref 22–32)
Calcium: 8 mg/dL — ABNORMAL LOW (ref 8.9–10.3)
Chloride: 105 mmol/L (ref 98–111)
Creatinine, Ser: 1.36 mg/dL — ABNORMAL HIGH (ref 0.61–1.24)
GFR, Estimated: 53 mL/min — ABNORMAL LOW (ref >60)
Glucose, Bld: 137 mg/dL — ABNORMAL HIGH (ref 70–99)
Potassium: 4.4 mmol/L (ref 3.5–5.1)
Sodium: 137 mmol/L (ref 135–145)
Total Bilirubin: 0.5 mg/dL (ref 0.3–1.2)
Total Protein: 6.3 g/dL — ABNORMAL LOW (ref 6.5–8.1)

## 2021-04-12 LAB — MAGNESIUM: Magnesium: 2 mg/dL (ref 1.7–2.4)

## 2021-04-12 LAB — CBC WITH DIFFERENTIAL/PLATELET
Abs Immature Granulocytes: 0.03 10*3/uL (ref 0.00–0.07)
Basophils Absolute: 0 10*3/uL (ref 0.0–0.1)
Basophils Relative: 1 %
Eosinophils Absolute: 0.1 10*3/uL (ref 0.0–0.5)
Eosinophils Relative: 2 %
HCT: 30.6 % — ABNORMAL LOW (ref 39.0–52.0)
Hemoglobin: 9.6 g/dL — ABNORMAL LOW (ref 13.0–17.0)
Immature Granulocytes: 1 %
Lymphocytes Relative: 15 %
Lymphs Abs: 1 10*3/uL (ref 0.7–4.0)
MCH: 30.7 pg (ref 26.0–34.0)
MCHC: 31.4 g/dL (ref 30.0–36.0)
MCV: 97.8 fL (ref 80.0–100.0)
Monocytes Absolute: 0.3 10*3/uL (ref 0.1–1.0)
Monocytes Relative: 4 %
Neutro Abs: 4.9 10*3/uL (ref 1.7–7.7)
Neutrophils Relative %: 77 %
Platelets: 192 10*3/uL (ref 150–400)
RBC: 3.13 MIL/uL — ABNORMAL LOW (ref 4.22–5.81)
RDW: 13.2 % (ref 11.5–15.5)
WBC: 6.4 10*3/uL (ref 4.0–10.5)
nRBC: 0 % (ref 0.0–0.2)

## 2021-04-12 MED ORDER — HEPARIN SOD (PORK) LOCK FLUSH 100 UNIT/ML IV SOLN: 500.0000 [IU] | Freq: Once | INTRAVENOUS | Status: AC

## 2021-04-12 MED ORDER — SODIUM CHLORIDE 0.9% FLUSH
10.0000 mL | Freq: Once | INTRAVENOUS | Status: AC
  Administered 2021-04-12: 10 mL via INTRAVENOUS

## 2021-04-12 MED ORDER — HEPARIN SOD (PORK) LOCK FLUSH 100 UNIT/ML IV SOLN
INTRAVENOUS | Status: AC
  Administered 2021-04-12: 500 [IU] via INTRAVENOUS
  Filled 2021-04-12: qty 5

## 2021-04-12 MED ORDER — CIPROFLOXACIN HCL 500 MG PO TABS: 500.0000 mg | ORAL_TABLET | Freq: Two times a day (BID) | ORAL | 0 refills | Status: DC

## 2021-04-12 MED ORDER — IOHEXOL 300 MG/ML  SOLN
50.0000 mL | Freq: Once | INTRAMUSCULAR | Status: AC | PRN
  Administered 2021-04-12: 15 mL via INTRAVENOUS

## 2021-04-12 NOTE — Patient Instructions (Addendum)
[place] at [facility] ?Discharge Instructions ? ? ?You were seen and examined today by Dr. Delton Coombes. ? ?He reviewed the results of your lab work which is normal/stable.  ? ?Return as scheduled. ? ? ?Thank you for choosing [facility] at [facility] to provide your oncology and hematology care.  To afford each patient quality time with our provider, please arrive at least 15 minutes before your scheduled appointment time.  ? ?If you have a lab appointment with the [facility] please come in thru the Main Entrance and check in at the main information desk. ? ?You need to re-schedule your appointment should you arrive 10 or more minutes late.  We strive to give you quality time with our providers, and arriving late affects you and other patients whose appointments are after yours.  Also, if you no show three or more times for appointments you may be dismissed from the clinic at the providers discretion.     ?Again, thank you for choosing [facility].  Our hope is that these requests will decrease the amount of time that you wait before being seen by our physicians.       ?_____________________________________________________________ ? ?Should you have questions after your visit to [facility], please contact our office at [phone] and follow the prompts.  Our office hours are 8:00 a.m. and 4:30 p.m. Monday - Friday.  Please note that voicemails left after 4:00 p.m. may not be returned until the following business day.  We are closed weekends and major holidays.  You do have access to a nurse 24-7, just call the main number to the clinic [phone] and do not press any options, hold on the line and a nurse will answer the phone.   ? ?For prescription refill requests, have your pharmacy contact our office and allow 72 hours.   ? ?Due to Covid, you will need to wear a mask upon entering the hospital. If you do not have a mask, a mask  will be given to you at the Main Entrance upon arrival. For doctor visits, patients may have 1 support person age 60 or older with them. For treatment visits, patients can not have anyone with them due to social distancing guidelines and our immunocompromised population.  ? ?   ?

## 2021-04-12 NOTE — Progress Notes (Signed)
Gary Nelson, Gary Nelson [id number]   CLINIC:  Medical Oncology/Hematology  PCP:  Ledell Noss, [place] / [place] [address] [phone]   REASON FOR VISIT:  Follow-up for metastatic colon cancer  PRIOR THERAPY: none  NGS Results: not done  CURRENT THERAPY: Stivarga 80 mg daily started on 09/25/2020  BRIEF ONCOLOGIC HISTORY:  Oncology History  Adenocarcinoma of colon ([place])  10/04/2012 Definitive Surgery   Partial collectomy by Dr. [name]   10/04/2012 Pathology Results   4.5 cm poorly differentiated adenocarcinoma, negative margins, 0/7 lymph nodes for metastatic disease.  Oncotype recurrence score of 26 (high)    Chemotherapy   FOLFOX    Adverse Reaction   Progressive peripheral neuropathy    Chemotherapy   Infusional 5 FU.  Completed 6 months worth of adjuvant therapy.   02/23/2014 Procedure   Colonoscopy by Dr. Anthony Sar.   07/01/2015 PET scan   There is malignant range uptake with RLL pulmonary nodule suspicious for metastatic disease or primary pulm neoplasm. Nonspecific focus of uptake in the L femoral neck. No corresponding CT abnormality. Cannot rule out metastasis.   07/06/2015 Imaging   MR C-spine- C5-6 there is a broad-based disc bulge w R paracentral disc protrusion deforming the spinal cord. Moderate R foraminal stenosis. C6-7 there is a central disc protrusion slightly eccentric towards the R and mildly deforming the ventral c-spine    09/16/2015 Procedure   RLL needle biopsy by IR.   09/17/2015 Pathology Results   Metastatic adenocarcinoma consistent with a primary colorectal adenocarcinoma.   10/12/2015 - 10/22/2015 Radiation Therapy   SBRT by Dr. [name] to RLL pulmonary lesion (biopsy proven to be oligometastasis).   12/09/2015 Imaging   CT chest- 1. Slight interval decrease in size of the right lower lobe pulmonary nodule. Some surrounding radiation changes. 2. No mediastinal or hilar mass or  adenopathy. 3. No new pulmonary nodules. 4. Stable underline emphysematous changes. 5. Cirrhotic changes involving the liver but no upper abdominal metastatic disease.   03/14/2016 Imaging   CT chest- 1. Continued mild reduction in the size of the treated right lobe pulmonary nodule. 2. Increased patchy consolidation, reticulation and ground-glass attenuation in the basilar right lower lobe, nonspecific, favor evolving postradiation change, recommend attention on follow-up chest CT. 3. No evidence of new or progressive metastatic disease in the chest. 4. Aortic atherosclerosis. Left main and 3 vessel coronary atherosclerosis. 5. Mild emphysema.   06/30/2016 Imaging   CT CAP- 1. The appearance of the treated right lower lobe nodule is essentially unchanged, and there are evolving postradiation changes in the base of the right lower lobe, as above. No definite signs of new metastatic disease are noted elsewhere in the chest, abdomen or pelvis. 2. Aortic atherosclerosis, in addition to left main and 3 vessel coronary artery disease. Assessment for potential risk factor modification, dietary therapy or pharmacologic therapy may be warranted, if clinically indicated. 3. Morphologic changes in the liver suggestive of cirrhosis, as above. 4. Additional incidental findings, similar prior studies, as above.   11/17/2016 Imaging   CT CAP Study Result   CLINICAL DATA:  Stage IV colon cancer originally diagnosed in August 2014 status post partial colectomy, with right lower lobe lung metastasis diagnosed August 2017 status post SBRT completed 10/22/2015. Restaging.   EXAM: CT CHEST, ABDOMEN, AND PELVIS WITH CONTRAST   TECHNIQUE: Multidetector CT imaging of the chest, abdomen and pelvis was performed following the standard protocol during bolus  administration of intravenous contrast.   CONTRAST:  149m ISOVUE-300 IOPAMIDOL (ISOVUE-300) INJECTION 61%   COMPARISON:  06/30/2016 CT  chest, abdomen and pelvis.   FINDINGS: CT CHEST FINDINGS   Cardiovascular: Normal heart size. No significant pericardial fluid/thickening. Left main, left anterior descending, left circumflex and right coronary atherosclerosis. Atherosclerotic nonaneurysmal thoracic aorta. Normal caliber pulmonary arteries. No central pulmonary emboli. Right internal jugular MediPort terminates at the junction of the right and left brachiocephalic veins.   Mediastinum/Nodes: No discrete thyroid nodules. Unremarkable esophagus. No pathologically enlarged axillary, mediastinal or hilar lymph nodes.   Lungs/Pleura: No pneumothorax. No pleural effusion. There is a nodular 2.7 x 1.6 cm focus of consolidation in the medial basilar right lower lobe (series 3/ image 108), previously 1.6 x 1.1 cm. No appreciable change in patchy subpleural reticulation in both lungs without significant traction bronchiectasis or frank honeycombing. Mild centrilobular and paraseptal emphysema with mild diffuse bronchial wall thickening. No acute consolidative airspace disease or new significant pulmonary nodules.   Musculoskeletal: No aggressive appearing focal osseous lesions. Moderate thoracic spondylosis. Stable chronic left scapular deformity.   CT ABDOMEN PELVIS FINDINGS   Hepatobiliary: Normal liver with no liver mass. Cholecystectomy. No biliary ductal dilatation.   Pancreas: Normal, with no mass or duct dilation.   Spleen: Normal size. No mass.   Adrenals/Urinary Tract: Normal adrenals. No hydronephrosis. No renal masses. Normal bladder.   Stomach/Bowel: Grossly normal stomach. Normal caliber small bowel with no small bowel wall thickening. Stable appearance of the appendix, within normal limits. Stable postsurgical changes from partial distal colectomy with intact appearing distal colonic anastomosis. Oral contrast transits to the cecum. No large bowel wall thickening or new pericholecystic fat stranding  .   Vascular/Lymphatic: Atherosclerotic nonaneurysmal abdominal aorta. Patent portal, splenic, hepatic and renal veins. No pathologically enlarged lymph nodes in the abdomen or pelvis.   Reproductive:  Normal size prostate.   Other: No pneumoperitoneum, ascites or focal fluid collection. Stable subcutaneous calcified granulomas throughout the left lower back and gluteal regions.   Musculoskeletal: No aggressive appearing focal osseous lesions. Marked lumbar spondylosis.   IMPRESSION: 1. Nodular focus of consolidation in the medial basilar right lower lobe is increased in size. While this may represent evolving masslike radiation fibrosis, recurrence of the lung metastasis is not excluded. PET-CT would be useful for further evaluation. Otherwise, continued close chest CT surveillance is advised . 2. No additional potential new sites of metastatic disease in the chest, abdomen or pelvis. 3. No evidence of local tumor recurrence at the distal colonic anastomosis. 4. Left main and 3 vessel coronary atherosclerosis . 5. Aortic Atherosclerosis (ICD10-I70.0) and Emphysema (ICD10-J43.9).      11/30/2016 Relapse/Recurrence   PET: IMPRESSION: 1. Enlarging right lower lobe pulmonary nodule and slight increase in the SUV max suggesting residual neoplasm. No new pulmonary lesions. 2. No findings for local recurrence or abdominal/pelvic metastatic disease.   01/02/2017 - 01/12/2017 Radiation Therapy    The patient saw Dr. MLisbeth Renshawfor SBRT radiation treatment. The tumor in the right lung was treated with a course of stereotactic body radiation treatment. The patient received 50 Gy In 5 fractions at 10 Gy per fraction.      10/05/2017 - 10/05/2017 Chemotherapy   The patient had bevacizumab (AVASTIN) 900 mg in sodium chloride 0.9 % 100 mL chemo infusion, 7.3 mg/kg = 925 mg, Intravenous,  Once, 1 of 3 cycles Administration: 900 mg (10/05/2017)   for chemotherapy treatment.     Lung  metastasis ([place]  10/20/2015 Initial  Diagnosis   Lung metastasis ([place])   03/28/2021 -  Chemotherapy   Patient is on Treatment Plan :  COLORECTAL Bevacizumab + Trifluridine/Tipiracil q28d     Colon cancer ([place])  08/24/2020 Initial Diagnosis   Colon cancer ([place])   03/28/2021 -  Chemotherapy   Patient is on Treatment Plan :  COLORECTAL Bevacizumab + Trifluridine/Tipiracil q28d       CANCER STAGING:  Cancer Staging  Adenocarcinoma of colon [facility]) Staging form: Colon and Rectum, AJCC 7th Edition - Clinical stage from 10/04/2012: Stage IIA (T3, N0, M0) - Signed by Baird Cancer, PA-C on 07/15/2015 - Pathologic stage from 09/17/2015: Stage IVA (T3, N0, M1a) - Signed by Baird Cancer, PA-C on 10/05/2015   INTERVAL HISTORY:  Mr. GABERIEL YOUNGBLOOD, a 81 y.o. male, returns for routine follow-up and consideration for next cycle of chemotherapy. Malik was last seen on 03/03/2021.  Due for day #15 cycle #1 of bevacizumab today.   Overall, he tells me he has been feeling pretty well. He reports hematuria starting on 3/3; he reports his urine appears pink. He takes aspirin 81 mg, and he denies taking any other blood thinners. He denies increased fatigue since starting stivarga.   Overall, he feels ready for next cycle of chemo today.   REVIEW OF SYSTEMS:  Review of Systems  Constitutional:  Positive for fatigue. Negative for appetite change.  HENT:   Positive for trouble swallowing.   Respiratory:  Positive for cough and shortness of breath.   Gastrointestinal:  Positive for constipation and diarrhea.  Genitourinary:  Positive for hematuria.   Musculoskeletal:  Positive for arthralgias (5/10 hipps).  Neurological:  Positive for numbness (feet).  All other systems reviewed and are negative.  PAST MEDICAL/SURGICAL HISTORY:  Past Medical History:  Diagnosis Date   Adenocarcinoma of colon ([place])    RLL -  Pulmonary nodule (06/2015, concerning for mets vs primary; to see RAD-ON[facility] Dr. 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/28/2019   Cystitis    Diabetes mellitus without complication ([place])    Type II   DVT (deep venous thrombosis) ([place]) 2014   post op   Hematuria    Hypertension    Iron deficiency 07/31/2019   Neuropathy    Portal hypertension ([place]) 07/28/2019   Prostatitis    Pulmonary nodule    Sleep apnea    Past Surgical History:  Procedure Laterality Date   BACK SURGERY     CATARACT EXTRACTION     left   CATARACT EXTRACTION W/PHACO Right 04/11/2012   Procedure: CATARACT EXTRACTION PHACO AND INTRAOCULAR LENS PLACEMENT ([place]);  Surgeon: Tonny Branch, MD;  Location: [facility] ORS;  Service: Ophthalmology;  Laterality: Right;  CDE:62.48   CERVICAL FUSION     c4-c5   CHOLECYSTECTOMY     COLON SURGERY  09/29/12   Colon resection for cancer   COLONOSCOPY     EYE SURGERY     cataract   GIVENS CAPSULE STUDY N/A 03/15/2015   Procedure: GIVENS CAPSULE STUDY;  Surgeon: Rogene Houston, MD;  Location: [facility] ENDO SUITE;  Service: Endoscopy;  Laterality: N/A;  730   LEG SURGERY     ORIF FEMUR FRACTURE     right   POSTERIOR CERVICAL FUSION/FORAMINOTOMY N/A 07/27/2015   Procedure: Cervical four to Cervical seven Laminectomy with Cervical four to Thoracic one Dorsal internal fixation and fusion;  Surgeon:  Kevan Ny Ditty, MD;  Location: [place] ORS;  Service: Neurosurgery;  Laterality: N/A;  C4 to C7 Laminectomy with C4 to T1 Dorsal internal fixation and fusion    SOCIAL HISTORY:  Social History   Socioeconomic History   Marital status: Single    Spouse name: Not on file   Number of children: Not on file   Years of education: Not on file   Highest education level: Not on file  Occupational History   Not on file  Tobacco Use   Smoking status: Former    Packs/day: 1.00    Years: 55.00    Pack years: 55.00    Types: Cigarettes    Start date: 02/07/1955   Smokeless tobacco: Never   Tobacco comments:     quit 2015  Vaping Use   Vaping Use: Never used  Substance and Sexual Activity   Alcohol use: Not Currently    Alcohol/week: 3.0 standard drinks    Types: 3 Cans of beer per week   Drug use: No   Sexual activity: Yes    Birth control/protection: None  Other Topics Concern   Not on file  Social History Narrative   Not on file   Social Determinants of Health   Financial Resource Strain: Not on file  Food Insecurity: Not on file  Transportation Needs: Not on file  Physical Activity: Not on file  Stress: Not on file  Social Connections: Not on file  Intimate Partner Violence: Not on file    FAMILY HISTORY:  Family History  Problem Relation Age of Onset   Heart disease Father    Heart attack Father    Alzheimer's disease Mother     CURRENT MEDICATIONS:  Current Outpatient Medications  Medication Sig Dispense Refill   aspirin EC 81 MG tablet Take 81 mg by mouth daily.     Cholecalciferol 25 MCG (1000 UT) capsule Take by mouth.     [employer] B-12 5000 MCG SUBL Place 1 tablet (5,000 mcg total) under the tongue daily. 30 tablet 6   diphenoxylate-atropine (LOMOTIL) 2.5-0.025 MG tablet Take 1 tablet by mouth 4 (four) times daily as needed for diarrhea or loose stools. 60 tablet 0   furosemide (LASIX) 20 MG tablet Take 20 mg by mouth every other day.      gabapentin (NEURONTIN) 300 MG capsule Take 2 capsules (600 mg total) by mouth 3 (three) times daily. (Patient taking differently: Take 600 mg by mouth 2 (two) times daily.) 180 capsule 2   insulin NPH (HUMULIN N,NOVOLIN N) 100 UNIT/ML injection Inject 15-30 Units into the skin See admin instructions. Per pt, 5 units every morning and 5 units in the evening     ketoconazole (NIZORAL) 2 % cream Apply topically as needed.     lidocaine-prilocaine (EMLA) cream Apply to affected area once 30 g 3   naproxen (NAPROSYN) 500 MG tablet TAKE 1 TABLET BY MOUTH TWICE A DAY WITH MEALS     OXYGEN Inhale 2 L into the lungs continuous. At night time  only     phenylephrine-shark liver oil-mineral oil-petrolatum (PREPARATION H) 0.25-14-74.9 % rectal ointment Place 1 application rectally as needed for hemorrhoids.     prochlorperazine (COMPAZINE) 10 MG tablet Take 1 tablet (10 mg total) by mouth every 6 (six) hours as needed (Nausea or vomiting). 30 tablet 1   silver sulfADIAZINE (SILVADENE) 1 % cream Apply 1 application topically as needed.     sucralfate (CARAFATE) 1 GM/10ML suspension SWISH AND SWALLOW 15ML  FOUR TIMES DAILY     tamsulosin (FLOMAX) 0.4 MG CAPS capsule Take 1 capsule by mouth daily.     trifluridine-tipiracil (LONSURF) 20-8.19 MG tablet Take 2 tablets (40 mg of trifluridine total) by mouth 2 (two) times daily after a meal. 1 hr after AM & PM meals on days 1-5, 8-12. Repeat every 28day 40 tablet 0   atorvastatin (LIPITOR) 10 MG tablet Take 10 mg by mouth daily at 6 PM.      No current facility-administered medications for this visit.    ALLERGIES:  No Known Allergies  PHYSICAL EXAM:  Performance status (ECOG): 2 - Symptomatic, <50% confined to bed  Vitals:   04/12/21 1301  BP: (!) 176/64  Pulse: 91  Resp: 19  Temp: 98 F (36.7 C)  SpO2: 98%   Wt Readings from Last 3 Encounters:  04/12/21 249 lb (112.9 kg)  03/28/21 244 lb 6.4 oz (110.9 kg)  03/03/21 248 lb 6.4 oz (112.7 kg)   Physical Exam Vitals reviewed.  Constitutional:      Appearance: Normal appearance. He is obese.     Comments: In wheelchair  Cardiovascular:     Rate and Rhythm: Normal rate and regular rhythm.     Pulses: Normal pulses.     Heart sounds: Normal heart sounds.  Pulmonary:     Effort: Pulmonary effort is normal.     Breath sounds: Normal breath sounds.  Neurological:     General: No focal deficit present.     Mental Status: He is alert and oriented to person, place, and time.  Psychiatric:        Mood and Affect: Mood normal.        Behavior: Behavior normal.    LABORATORY DATA:  I have reviewed the labs as listed.  CBC  Latest Ref Rng & Units 04/12/2021 03/28/2021 03/03/2021  WBC 4.0 - 10.5 K/uL 6.4 8.7 7.2  Hemoglobin 13.0 - 17.0 g/dL 9.6(L) 10.5(L) 10.9(L)  Hematocrit 39.0 - 52.0 % 30.6(L) 33.6(L) 33.7(L)  Platelets 150 - 400 K/uL 192 263 192   CMP Latest Ref Rng & Units 04/12/2021 03/28/2021 03/03/2021  Glucose 70 - 99 mg/dL 137(H) 142(H) 149(H)  BUN 8 - 23 mg/dL 27(H) 31(H) 44(H)  Creatinine 0.61 - 1.24 mg/dL 1.36(H) 1.22 1.45(H)  Sodium 135 - 145 mmol/L 137 137 136  Potassium 3.5 - 5.1 mmol/L 4.4 4.7 6.4(HH)  Chloride 98 - 111 mmol/L 105 105 107  CO2 22 - 32 mmol/L _0 Calcium 8.9 - 10.3 mg/dL 8.0(L) 8.5(L) 8.5(L)  Total Protein 6.5 - 8.1 g/dL 6.3(L) 6.7 6.7  Total Bilirubin 0.3 - 1.2 mg/dL 0.5 0.6 0.2(L)  Alkaline Phos 38 - 126 U/L 75 80 69  AST 15 - 41 U/L _1 ALT 0 - 44 U/L _2 DIAGNOSTIC IMAGING:  I have independently reviewed the scans and discussed with the patient. US RENAL  Result Date: 03/19/2021 CLINICAL DATA:  Chronic kidney disease EXAM: RENAL / URINARY TRACT ULTRASOUND COMPLETE COMPARISON:  CT 08/02/2020 FINDINGS: Right Kidney: Renal measurements: 10.9 x 5.6 x 4.8 cm = volume: 154 mL. Echogenicity within normal limits. No mass or hydronephrosis visualized. Left Kidney: Renal measurements: 10.9 x 5.9 x 5.8 cm = volume: 193 mL. Echogenicity within normal limits. No mass or hydronephrosis visualized. Bladder: Decompressed Other: None. IMPRESSION: No acute findings.  No hydronephrosis. Electronically Signed   By: Rolm Baptise M.D.   On: 03/19/2021 10:03   DG  CV Line Injection  Result Date: 04/12/2021 INDICATION: RIGHT lung cancer, unable to withdraw blood from Port-A-Cath EXAM: CONTRAST INJECTION OF PORT A CATH UNDER FLUOROSCOPY CONTRAST:  83m OMNIPAQUE IOHEXOL 300 MG/ML  SOLN TECHNIQUE: Contrast was administered via the indwelling port after it was accessed. Fluoroscopic spot images were obtained of the catheter during injection ANESTHESIA/SEDATION: None FLUOROSCOPY TIME:   Radiation Exposure Index (as provided by the fluoroscopic device): 7.0 mGy Number of Acquired Images:  Multiple fluoroscopic captures COMPLICATIONS: None immediate. TECHNIQUE: RIGHT jugular Port-A-Cath was already accessed. Contrast was injected using sterile technique. FINDINGS: Contrast opacifies the reservoir without of contrast extravasation. Contrast opacifies the Port-A-Cath tubing to the tip. With injection, contracts exits the catheter tip and passes proximally along the catheter tubing adjacent to a fibrin sheath. Contrast and spills into the IVC. No obstruction to contrast injection. Unable to withdraw blood from the catheter. IMPRESSION: Fibrin sheath at the tip of the RIGHT jugular Port-A-Cath tubing. Unable to withdraw blood but able to easily inject contrast. Electronically Signed   By: MLavonia DanaM.D.   On: 04/12/2021 12:07     ASSESSMENT:  1.  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 - Oligometastasis with right lower lobe pulmonary nodule, status post SBRT from 10/12/2015 through 10/22/2015 by Dr. MLisbeth Renshaw - Retreatment with SBRT to the right lung from 01/02/2017 through 01/12/2017. - Xeloda 2 weeks on/1 week off with 1 dose of Avastin on 10/05/2017, discontinued secondary to extensive rash in the groin and buttock region. - Stivarga 80 mg 3 weeks on/1 week off from February 2020 through 12/17/2018. -Stivarga discontinued because of unhealed wounds in the groin.  They have healed up after discontinuation. - Stivarga 80 mg 3 weeks on/1 week off from 09/25/2020 through 01/13/2021 with progression in the right lower lobe lung mass. - RAS/RAF panel on 09/06/2017 shows BRAF [id number] mutation.  K-ras and NRAS negative. - PET scan on 01/06/2021 showed slight interval increase in size of the right lower lobe pulmonary mass which remains markedly hypermetabolic.   No new sites of increased uptake in the chest, abdomen or pelvis.  Small right pleural effusion. - She was evaluated by Dr. MLisbeth RenshawXRT.  He was not felt to be a candidate for retreatment in the same area. - We have reached out to Dr. YKathlene Cotewho did not feel ablation is feasible due to the size of the lesion of being more than 3 cm. - Lonsurf 40 mg twice daily on days 1-5 and 8-12 every 28 days with bevacizumab every 2 weeks started on 03/28/2021.   2.  Chronic ulcers: -He had developed ulcers in the right upper thigh which completely healed since we stopped Stivarga.   PLAN:  1.  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 - He did not experience any major side effects from Lonsurf including GI side effects.  No major tiredness reported. - He reported hematuria 1 episode on Friday and 1 episode this morning. - Reviewed his labs today which showed creatinine 1.36 and stable.  Other LFTs are normal.  CBC shows hemoglobin is 9.6 down from 10.5 last week. - UA shows moderate leukocytes with protein more than 300.  Urine is pink and cloudy. - Would hold off on bevacizumab today.  We will give him ciprofloxacin twice daily for 5 days empirically. -  We will recheck his UA in 2 weeks.  If the protein improves to below 100, we will proceed with cycle 2 of bevacizumab. - RTC 4 weeks for follow-up.  2.  Hyperkalemia - Potassium today is normal at 4.4.   Orders placed this encounter:  No orders of the defined types were placed in this encounter.    Derek Jack, MD [place] [phone]   I, Thana Ates, am acting as a scribe for Dr. Derek Jack.  I, Derek Jack MD, have reviewed the above documentation for accuracy and completeness, and I agree with the above.

## 2021-04-12 NOTE — Progress Notes (Signed)
Patient presents today for treatment and follow up visit with Dr. Delton Coombes. UA pending. Blood work within parameters for treatment today. Patient had dye study performed prior to arrival. Fibrin sheath noted on impression. Reported results to Dr. Delton Coombes. Per MD, proceed with treatment. Blood pressure on arrival 176/64 and MD aware.  ? ?UA results protein > than 300.  ? ?Verbal order received from Dr. Delton Coombes. NO treatment today, patient to return in two weeks for labs and tx .  ? ?Vital signs stable. No complaints at this time. Discharged from clinic by wheel chair in stable condition. Alert and oriented x 3. F/U with [facility] as scheduled.   ?

## 2021-04-13 ENCOUNTER — Other Ambulatory Visit (HOSPITAL_COMMUNITY): Payer: Self-pay

## 2021-04-15 ENCOUNTER — Other Ambulatory Visit (HOSPITAL_COMMUNITY): Payer: Self-pay

## 2021-04-15 ENCOUNTER — Other Ambulatory Visit (HOSPITAL_COMMUNITY): Payer: Self-pay | Admitting: Hematology

## 2021-04-15 DIAGNOSIS — C7801 Secondary malignant neoplasm of right lung: Secondary | ICD-10-CM

## 2021-04-15 MED ORDER — LONSURF 20-8.19 MG PO TABS
20.0000 mg/m2 | ORAL_TABLET | Freq: Two times a day (BID) | ORAL | 0 refills | Status: DC
  Filled 2021-04-15: qty 40, 28d supply, fill #0

## 2021-04-20 ENCOUNTER — Other Ambulatory Visit (HOSPITAL_COMMUNITY): Payer: Self-pay

## 2021-04-26 ENCOUNTER — Inpatient Hospital Stay (HOSPITAL_COMMUNITY): Payer: Medicare Other

## 2021-04-26 ENCOUNTER — Other Ambulatory Visit (HOSPITAL_COMMUNITY): Payer: Medicare Other

## 2021-04-26 ENCOUNTER — Other Ambulatory Visit: Payer: Self-pay

## 2021-04-26 ENCOUNTER — Other Ambulatory Visit (HOSPITAL_COMMUNITY): Payer: Self-pay | Admitting: Hematology

## 2021-04-26 ENCOUNTER — Ambulatory Visit (HOSPITAL_COMMUNITY): Payer: Medicare Other

## 2021-04-26 ENCOUNTER — Ambulatory Visit (HOSPITAL_COMMUNITY): Payer: Medicare Other | Admitting: Hematology

## 2021-04-26 VITALS — BP 177/61 | HR 94 | Temp 97.1°F | Resp 16

## 2021-04-26 DIAGNOSIS — C189 Malignant neoplasm of colon, unspecified: Secondary | ICD-10-CM

## 2021-04-26 DIAGNOSIS — K766 Portal hypertension: Secondary | ICD-10-CM

## 2021-04-26 DIAGNOSIS — Z5112 Encounter for antineoplastic immunotherapy: Secondary | ICD-10-CM | POA: Diagnosis not present

## 2021-04-26 DIAGNOSIS — C7801 Secondary malignant neoplasm of right lung: Secondary | ICD-10-CM

## 2021-04-26 LAB — COMPREHENSIVE METABOLIC PANEL
ALT: 20 U/L (ref 0–44)
AST: 22 U/L (ref 15–41)
Albumin: 2.9 g/dL — ABNORMAL LOW (ref 3.5–5.0)
Alkaline Phosphatase: 87 U/L (ref 38–126)
Anion gap: 8 (ref 5–15)
BUN: 27 mg/dL — ABNORMAL HIGH (ref 8–23)
CO2: 25 mmol/L (ref 22–32)
Calcium: 7.9 mg/dL — ABNORMAL LOW (ref 8.9–10.3)
Chloride: 104 mmol/L (ref 98–111)
Creatinine, Ser: 1.4 mg/dL — ABNORMAL HIGH (ref 0.61–1.24)
GFR, Estimated: 51 mL/min — ABNORMAL LOW (ref >60)
Glucose, Bld: 161 mg/dL — ABNORMAL HIGH (ref 70–99)
Potassium: 4.3 mmol/L (ref 3.5–5.1)
Sodium: 137 mmol/L (ref 135–145)
Total Bilirubin: 0.4 mg/dL (ref 0.3–1.2)
Total Protein: 6.4 g/dL — ABNORMAL LOW (ref 6.5–8.1)

## 2021-04-26 LAB — CBC WITH DIFFERENTIAL/PLATELET
Abs Immature Granulocytes: 0.01 10*3/uL (ref 0.00–0.07)
Basophils Absolute: 0.1 10*3/uL (ref 0.0–0.1)
Basophils Relative: 1 %
Eosinophils Absolute: 0.4 10*3/uL (ref 0.0–0.5)
Eosinophils Relative: 8 %
HCT: 32.6 % — ABNORMAL LOW (ref 39.0–52.0)
Hemoglobin: 10.3 g/dL — ABNORMAL LOW (ref 13.0–17.0)
Immature Granulocytes: 0 %
Lymphocytes Relative: 21 %
Lymphs Abs: 1.1 10*3/uL (ref 0.7–4.0)
MCH: 31.7 pg (ref 26.0–34.0)
MCHC: 31.6 g/dL (ref 30.0–36.0)
MCV: 100.3 fL — ABNORMAL HIGH (ref 80.0–100.0)
Monocytes Absolute: 0.8 10*3/uL (ref 0.1–1.0)
Monocytes Relative: 15 %
Neutro Abs: 2.6 10*3/uL (ref 1.7–7.7)
Neutrophils Relative %: 55 %
Platelets: 295 10*3/uL (ref 150–400)
RBC: 3.25 MIL/uL — ABNORMAL LOW (ref 4.22–5.81)
RDW: 15.9 % — ABNORMAL HIGH (ref 11.5–15.5)
WBC: 4.9 10*3/uL (ref 4.0–10.5)
nRBC: 0 % (ref 0.0–0.2)

## 2021-04-26 LAB — MAGNESIUM: Magnesium: 2.2 mg/dL (ref 1.7–2.4)

## 2021-04-26 MED ORDER — HEPARIN SOD (PORK) LOCK FLUSH 100 UNIT/ML IV SOLN
500.0000 [IU] | Freq: Once | INTRAVENOUS | Status: AC | PRN
  Administered 2021-04-26: 500 [IU]

## 2021-04-26 MED ORDER — AMLODIPINE BESYLATE 5 MG PO TABS
5.0000 mg | ORAL_TABLET | Freq: Every day | ORAL | 1 refills | Status: DC
Start: 2021-04-26 — End: 2021-06-28

## 2021-04-26 MED ORDER — SODIUM CHLORIDE 0.9% FLUSH
10.0000 mL | INTRAVENOUS | Status: DC | PRN
  Administered 2021-04-26 (×2): 10 mL

## 2021-04-26 MED ORDER — AMLODIPINE BESYLATE 5 MG PO TABS
5.0000 mg | ORAL_TABLET | Freq: Once | ORAL | Status: AC
  Administered 2021-04-26: 5 mg via ORAL
  Filled 2021-04-26: qty 1

## 2021-04-26 MED ORDER — SODIUM CHLORIDE 0.9 % IV SOLN
5.0000 mg/kg | Freq: Once | INTRAVENOUS | Status: AC
  Administered 2021-04-26: 600 mg via INTRAVENOUS
  Filled 2021-04-26: qty 8

## 2021-04-26 MED ORDER — SODIUM CHLORIDE 0.9 % IV SOLN: Freq: Once | INTRAVENOUS | Status: AC

## 2021-04-26 NOTE — Patient Instructions (Signed)
[facility]  Discharge Instructions: ?Thank you for choosing [facility] to provide your oncology and hematology care.  ?If you have a lab appointment with the [facility], please come in thru the Main Entrance and check in at the main information desk. ? ?Wear comfortable clothing and clothing appropriate for easy access to any Portacath or PICC line.  ? ?We strive to give you quality time with your provider. You may need to reschedule your appointment if you arrive late (15 or more minutes).  Arriving late affects you and other patients whose appointments are after yours.  Also, if you miss three or more appointments without notifying the office, you may be dismissed from the clinic at the provider?s discretion.    ?  ?For prescription refill requests, have your pharmacy contact our office and allow 72 hours for refills to be completed.   ? ?Today you received the following chemotherapy and/or immunotherapy agents avastin.   ?  ?To help prevent nausea and vomiting after your treatment, we encourage you to take your nausea medication as directed. ? ?BELOW ARE SYMPTOMS THAT SHOULD BE REPORTED IMMEDIATELY: ?*FEVER GREATER THAN 100.4 F (38 ?C) OR HIGHER ?*CHILLS OR SWEATING ?*NAUSEA AND VOMITING THAT IS NOT CONTROLLED WITH YOUR NAUSEA MEDICATION ?*UNUSUAL SHORTNESS OF BREATH ?*UNUSUAL BRUISING OR BLEEDING ?*URINARY PROBLEMS (pain or burning when urinating, or frequent urination) ?*BOWEL PROBLEMS (unusual diarrhea, constipation, pain near the anus) ?TENDERNESS IN MOUTH AND THROAT WITH OR WITHOUT PRESENCE OF ULCERS (sore throat, sores in mouth, or a toothache) ?UNUSUAL RASH, SWELLING OR PAIN  ?UNUSUAL VAGINAL DISCHARGE OR ITCHING  ? ?Items with * indicate a potential emergency and should be followed up as soon as possible or go to the Emergency Department if any problems should occur. ? ?Please show the CHEMOTHERAPY ALERT CARD or IMMUNOTHERAPY ALERT CARD at check-in to the Emergency  Department and triage nurse. ? ?Should you have questions after your visit or need to cancel or reschedule your appointment, please contact [facility] [phone]  and follow the prompts.  Office hours are 8:00 a.m. to 4:30 p.m. Monday - Friday. Please note that voicemails left after 4:00 p.m. may not be returned until the following business day.  We are closed weekends and major holidays. You have access to a nurse at all times for urgent questions. Please call the main number to the clinic [phone] and follow the prompts. ? ?For any non-urgent questions, you may also contact your provider using MyChart. We now offer e-Visits for anyone 49 and older to request care online for non-urgent symptoms. For details visit [email or web address]. ?  ?Also download the MyChart app! Go to the app store, search "MyChart", open the app, select [facility], and log in with your MyChart username and password. ? ?Due to Covid, a mask is required upon entering the hospital/clinic. If you do not have a mask, one will be given to you upon arrival. For doctor visits, patients may have 1 support person aged 61 or older with them. For treatment visits, patients cannot have anyone with them due to current Covid guidelines and our immunocompromised population.  ?

## 2021-04-26 NOTE — Progress Notes (Signed)
Reviewed urine and blood pressures with the oncologist.  Ok to treat today.  See pharmacy note.  ?

## 2021-04-26 NOTE — Progress Notes (Signed)
?  Ok to proceed without UP today per Dr Delton Coombes. ? ?Give Norvasc 5 mg po x 1 for elevated BP. ? ?T.O. Dr Rhys Martini, PharmD ? ?

## 2021-05-02 ENCOUNTER — Other Ambulatory Visit (HOSPITAL_COMMUNITY): Payer: Self-pay

## 2021-05-08 ENCOUNTER — Encounter (HOSPITAL_COMMUNITY): Payer: Self-pay | Admitting: Hematology

## 2021-05-10 ENCOUNTER — Ambulatory Visit (HOSPITAL_COMMUNITY): Payer: Medicare Other | Admitting: Hematology

## 2021-05-10 ENCOUNTER — Ambulatory Visit (HOSPITAL_COMMUNITY): Payer: Medicare Other

## 2021-05-10 ENCOUNTER — Inpatient Hospital Stay (HOSPITAL_COMMUNITY): Payer: Medicare Other

## 2021-05-10 ENCOUNTER — Other Ambulatory Visit (HOSPITAL_COMMUNITY): Payer: Medicare Other

## 2021-05-10 ENCOUNTER — Inpatient Hospital Stay (HOSPITAL_BASED_OUTPATIENT_CLINIC_OR_DEPARTMENT_OTHER): Payer: Medicare Other | Admitting: Hematology

## 2021-05-10 ENCOUNTER — Inpatient Hospital Stay (HOSPITAL_COMMUNITY): Payer: Medicare Other | Attending: Hematology

## 2021-05-10 VITALS — BP 166/70 | HR 95 | Temp 97.1°F | Resp 20 | Ht 61.0 in | Wt 251.0 lb

## 2021-05-10 DIAGNOSIS — M25552 Pain in left hip: Secondary | ICD-10-CM | POA: Diagnosis not present

## 2021-05-10 DIAGNOSIS — R059 Cough, unspecified: Secondary | ICD-10-CM | POA: Insufficient documentation

## 2021-05-10 DIAGNOSIS — C189 Malignant neoplasm of colon, unspecified: Secondary | ICD-10-CM | POA: Insufficient documentation

## 2021-05-10 DIAGNOSIS — R0602 Shortness of breath: Secondary | ICD-10-CM | POA: Diagnosis not present

## 2021-05-10 DIAGNOSIS — R5383 Other fatigue: Secondary | ICD-10-CM | POA: Diagnosis not present

## 2021-05-10 DIAGNOSIS — Z87891 Personal history of nicotine dependence: Secondary | ICD-10-CM | POA: Diagnosis not present

## 2021-05-10 DIAGNOSIS — Z8249 Family history of ischemic heart disease and other diseases of the circulatory system: Secondary | ICD-10-CM | POA: Diagnosis not present

## 2021-05-10 DIAGNOSIS — C7801 Secondary malignant neoplasm of right lung: Secondary | ICD-10-CM

## 2021-05-10 DIAGNOSIS — R197 Diarrhea, unspecified: Secondary | ICD-10-CM | POA: Diagnosis not present

## 2021-05-10 DIAGNOSIS — I10 Essential (primary) hypertension: Secondary | ICD-10-CM | POA: Insufficient documentation

## 2021-05-10 DIAGNOSIS — Z79899 Other long term (current) drug therapy: Secondary | ICD-10-CM | POA: Insufficient documentation

## 2021-05-10 DIAGNOSIS — Z818 Family history of other mental and behavioral disorders: Secondary | ICD-10-CM | POA: Insufficient documentation

## 2021-05-10 DIAGNOSIS — Z5112 Encounter for antineoplastic immunotherapy: Secondary | ICD-10-CM | POA: Diagnosis present

## 2021-05-10 DIAGNOSIS — R32 Unspecified urinary incontinence: Secondary | ICD-10-CM | POA: Diagnosis not present

## 2021-05-10 DIAGNOSIS — E669 Obesity, unspecified: Secondary | ICD-10-CM | POA: Insufficient documentation

## 2021-05-10 DIAGNOSIS — R319 Hematuria, unspecified: Secondary | ICD-10-CM | POA: Diagnosis not present

## 2021-05-10 DIAGNOSIS — M25551 Pain in right hip: Secondary | ICD-10-CM | POA: Diagnosis not present

## 2021-05-10 LAB — COMPREHENSIVE METABOLIC PANEL
ALT: 17 U/L (ref 0–44)
AST: 24 U/L (ref 15–41)
Albumin: 3 g/dL — ABNORMAL LOW (ref 3.5–5.0)
Alkaline Phosphatase: 84 U/L (ref 38–126)
Anion gap: 6 (ref 5–15)
BUN: 27 mg/dL — ABNORMAL HIGH (ref 8–23)
CO2: 25 mmol/L (ref 22–32)
Calcium: 8.1 mg/dL — ABNORMAL LOW (ref 8.9–10.3)
Chloride: 107 mmol/L (ref 98–111)
Creatinine, Ser: 1.23 mg/dL (ref 0.61–1.24)
GFR, Estimated: 60 mL/min — ABNORMAL LOW (ref >60)
Glucose, Bld: 103 mg/dL — ABNORMAL HIGH (ref 70–99)
Potassium: 4.4 mmol/L (ref 3.5–5.1)
Sodium: 138 mmol/L (ref 135–145)
Total Bilirubin: 0.5 mg/dL (ref 0.3–1.2)
Total Protein: 6.4 g/dL — ABNORMAL LOW (ref 6.5–8.1)

## 2021-05-10 LAB — CBC WITH DIFFERENTIAL/PLATELET
Abs Immature Granulocytes: 0.02 10*3/uL (ref 0.00–0.07)
Basophils Absolute: 0 10*3/uL (ref 0.0–0.1)
Basophils Relative: 1 %
Eosinophils Absolute: 0.1 10*3/uL (ref 0.0–0.5)
Eosinophils Relative: 1 %
HCT: 27.9 % — ABNORMAL LOW (ref 39.0–52.0)
Hemoglobin: 8.9 g/dL — ABNORMAL LOW (ref 13.0–17.0)
Immature Granulocytes: 0 %
Lymphocytes Relative: 22 %
Lymphs Abs: 1.1 10*3/uL (ref 0.7–4.0)
MCH: 31 pg (ref 26.0–34.0)
MCHC: 31.9 g/dL (ref 30.0–36.0)
MCV: 97.2 fL (ref 80.0–100.0)
Monocytes Absolute: 0.2 10*3/uL (ref 0.1–1.0)
Monocytes Relative: 4 %
Neutro Abs: 3.6 10*3/uL (ref 1.7–7.7)
Neutrophils Relative %: 72 %
Platelets: 191 10*3/uL (ref 150–400)
RBC: 2.87 MIL/uL — ABNORMAL LOW (ref 4.22–5.81)
RDW: 14.6 % (ref 11.5–15.5)
WBC: 4.9 10*3/uL (ref 4.0–10.5)
nRBC: 0 % (ref 0.0–0.2)

## 2021-05-10 LAB — URINALYSIS, DIPSTICK ONLY
Bilirubin Urine: NEGATIVE
Glucose, UA: NEGATIVE mg/dL
Ketones, ur: NEGATIVE mg/dL
Nitrite: NEGATIVE
Protein, ur: >=300 mg/dL — AB
Specific Gravity, Urine: 1.009 (ref 1.005–1.030)
pH: 7 (ref 5.0–8.0)

## 2021-05-10 LAB — MAGNESIUM: Magnesium: 2 mg/dL (ref 1.7–2.4)

## 2021-05-10 MED ORDER — LONSURF 20-8.19 MG PO TABS: ORAL_TABLET | ORAL | 0 refills | Status: DC

## 2021-05-10 MED ORDER — NITROFURANTOIN MONOHYD MACRO 100 MG PO CAPS: 100.0000 mg | ORAL_CAPSULE | Freq: Two times a day (BID) | ORAL | 0 refills | Status: DC

## 2021-05-10 NOTE — Patient Instructions (Addendum)
[facility] at [facility] ?Discharge Instructions ? ? ?You were seen and examined today by Dr. Delton Coombes. ? ?He reviewed your lab work which is normal/stable. However, the protein in your urine is high. We have sent in an antibiotic for you to take to see if an infection is causing this. The prescription is for Macrobid 100 mg. You will take 1 pill twice a day for one week.  ? ?We will hold the Avastin infusion today due to the elevated protein in your urine, as it can cause this to worsen. ? ?We are going to increase the dose of your pills (Lonsurf) which will be 3 pills twice a day when you restart.  ? ?Return as scheduled.  ? ? ?Thank you for choosing [place] at [facility] to provide your oncology and hematology care.  To afford each patient quality time with our provider, please arrive at least 15 minutes before your scheduled appointment time.  ? ?If you have a lab appointment with the [facility] please come in thru the Main Entrance and check in at the main information desk. ? ?You need to re-schedule your appointment should you arrive 10 or more minutes late.  We strive to give you quality time with our providers, and arriving late affects you and other patients whose appointments are after yours.  Also, if you no show three or more times for appointments you may be dismissed from the clinic at the providers discretion.     ?Again, thank you for choosing [facility]  Our hope is that these requests will decrease the amount of time that you wait before being seen by our physicians.       ?_____________________________________________________________ ? ?Should you have questions after your visit to [facility], please contact our office at [phone] and follow the prompts.  Our office hours are 8:00 a.m. and 4:30 p.m. Monday - Friday.  Please note that voicemails left after 4:00 p.m. may not be returned until the following  business day.  We are closed weekends and major holidays.  You do have access to a nurse 24-7, just call the main number to the clinic [phone] and do not press any options, hold on the line and a nurse will answer the phone.   ? ?For prescription refill requests, have your pharmacy contact our office and allow 72 hours.   ? ?Due to Covid, you will need to wear a mask upon entering the hospital. If you do not have a mask, a mask will be given to you at the Main Entrance upon arrival. For doctor visits, patients may have 1 support person age 93 or older with them. For treatment visits, patients can not have anyone with them due to social distancing guidelines and our immunocompromised population.  ? ?   ?

## 2021-05-10 NOTE — Progress Notes (Signed)
No treatment today per Dr.K.  ?

## 2021-05-10 NOTE — Progress Notes (Signed)
? ?[facility] ?[address] ?[place], [place] [address] ? ? ?CLINIC:  ?Medical Oncology/Hematology ? ?PCP:  ?[place], [facility] ?[place] / [place] [address] ?[phone] ? ? 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 ?PRIOR THERAPY: Stivarga 80 mg daily 09/25/2020 - 01/13/2021 ? ?NGS Results: not done ? ?CURRENT THERAPY:  Bevacizumab + Trifluridine/Tipiracil q28d ? ?BRIEF ONCOLOGIC HISTORY:  ?Oncology History  ?Adenocarcinoma of colon ([place])  ?10/04/2012 Definitive Surgery  ? Partial collectomy by Dr. [name] ?  ?10/04/2012 Pathology Results  ? 4.5 cm poorly differentiated adenocarcinoma, negative margins, 0/7 lymph nodes for metastatic disease.  Oncotype recurrence score of 26 (high) ?  ? Chemotherapy  ? FOLFOX ?  ? Adverse Reaction  ? Progressive peripheral neuropathy ?  ? Chemotherapy  ? Infusional 5 FU.  Completed 6 months worth of adjuvant therapy. ?  ?02/23/2014 Procedure  ? Colonoscopy by Dr. Anthony Sar. ?  ?07/01/2015 PET scan  ? There is malignant range uptake with RLL pulmonary nodule suspicious for metastatic disease or primary pulm neoplasm. Nonspecific focus of uptake in the L femoral neck. No corresponding CT abnormality. Cannot rule out metastasis. ?  ?07/06/2015 Imaging  ? MR C-spine- C5-6 there is a broad-based disc bulge w R paracentral disc protrusion deforming the spinal cord. Moderate R foraminal stenosis. C6-7 there is a central disc protrusion slightly eccentric towards the R and mildly deforming the ventral c-spine  ?  ?09/16/2015 Procedure  ? RLL needle biopsy by IR. ?  ?09/17/2015 Pathology Results  ? Metastatic adenocarcinoma consistent with a primary colorectal adenocarcinoma. ?  ?10/12/2015 - 10/22/2015 Radiation Therapy  ? SBRT by Dr. [name] to RLL pulmonary lesion (biopsy proven to be oligometastasis). ?  ?12/09/2015 Imaging  ? CT chest- 1. Slight interval decrease in size of the right lower lobe ?pulmonary nodule. Some surrounding radiation  changes. ?2. No mediastinal or hilar mass or adenopathy. ?3. No new pulmonary nodules. ?4. Stable underline emphysematous changes. ?5. Cirrhotic changes involving the liver but no upper abdominal ?metastatic disease. ?  ?03/14/2016 Imaging  ? CT chest- 1. Continued mild reduction in the size of the treated right lobe ?pulmonary nodule. ?2. Increased patchy consolidation, reticulation and ground-glass ?attenuation in the basilar right lower lobe, nonspecific, favor ?evolving postradiation change, recommend attention on follow-up ?chest CT. ?3. No evidence of new or progressive metastatic disease in the chest. ?4. Aortic atherosclerosis. Left main and 3 vessel coronary ?atherosclerosis. ?5. Mild emphysema. ?  ?06/30/2016 Imaging  ? CT CAP- 1. The appearance of the treated right lower lobe nodule is ?essentially unchanged, and there are evolving postradiation changes ?in the base of the right lower lobe, as above. No definite signs of ?new metastatic disease are noted elsewhere in the chest, abdomen or ?pelvis. ?2. Aortic atherosclerosis, in addition to left main and 3 vessel ?coronary artery disease. Assessment for potential risk factor ?modification, dietary therapy or pharmacologic therapy may be ?warranted, if clinically indicated. ?3. Morphologic changes in the liver suggestive of cirrhosis, as ?above. ?4. Additional incidental findings, similar prior studies, as above. ?  ?11/17/2016 Imaging  ? CT CAP ?Study Result  ? ?CLINICAL DATA:  Stage IV colon cancer originally diagnosed in August ?2014 status post partial colectomy, with right lower lobe lung ?metastasis diagnosed August 2017 status post SBRT completed ?10/22/2015. Restaging. ?  ?EXAM: ?CT CHEST, ABDOMEN, AND PELVIS WITH CONTRAST ?  ?TECHNIQUE: ?Multidetector CT imaging of the chest, abdomen and pelvis  was ?performed following the standard protocol during bolus ?administration of intravenous contrast. ?  ?CONTRAST:  178mL ISOVUE-300 IOPAMIDOL (ISOVUE-300)  INJECTION 61% ?  ?COMPARISON:  06/30/2016 CT chest, abdomen and pelvis. ?  ?FINDINGS: ?CT CHEST FINDINGS ?  ?Cardiovascular: Normal heart size. No significant pericardial ?fluid/thickening. Left main, left anterior descending, left ?circumflex and right coronary atherosclerosis. Atherosclerotic ?nonaneurysmal thoracic aorta. Normal caliber pulmonary arteries. No ?central pulmonary emboli. Right internal jugular MediPort terminates ?at the junction of the right and left brachiocephalic veins. ?  ?Mediastinum/Nodes: No discrete thyroid nodules. Unremarkable ?esophagus. No pathologically enlarged axillary, mediastinal or hilar ?lymph nodes. ?  ?Lungs/Pleura: No pneumothorax. No pleural effusion. There is a ?nodular 2.7 x 1.6 cm focus of consolidation in the medial basilar ?right lower lobe (series 3/ image 108), previously 1.6 x 1.1 cm. No ?appreciable change in patchy subpleural reticulation in both lungs ?without significant traction bronchiectasis or frank honeycombing. ?Mild centrilobular and paraseptal emphysema with mild diffuse ?bronchial wall thickening. No acute consolidative airspace disease ?or new significant pulmonary nodules. ?  ?Musculoskeletal: No aggressive appearing focal osseous lesions. ?Moderate thoracic spondylosis. Stable chronic left scapular ?deformity. ?  ?CT ABDOMEN PELVIS FINDINGS ?  ?Hepatobiliary: Normal liver with no liver mass. Cholecystectomy. No ?biliary ductal dilatation. ?  ?Pancreas: Normal, with no mass or duct dilation. ?  ?Spleen: Normal size. No mass. ?  ?Adrenals/Urinary Tract: Normal adrenals. No hydronephrosis. No renal ?masses. Normal bladder. ?  ?Stomach/Bowel: Grossly normal stomach. Normal caliber small bowel ?with no small bowel wall thickening. Stable appearance of the ?appendix, within normal limits. Stable postsurgical changes from ?partial distal colectomy with intact appearing distal colonic ?anastomosis. Oral contrast transits to the cecum. No large bowel ?wall  thickening or new pericholecystic fat stranding . ?  ?Vascular/Lymphatic: Atherosclerotic nonaneurysmal abdominal aorta. ?Patent portal, splenic, hepatic and renal veins. No pathologically ?enlarged lymph nodes in the abdomen or pelvis. ?  ?Reproductive:  Normal size prostate. ?  ?Other: No pneumoperitoneum, ascites or focal fluid collection. ?Stable subcutaneous calcified granulomas throughout the left lower ?back and gluteal regions. ?  ?Musculoskeletal: No aggressive appearing focal osseous lesions. ?Marked lumbar spondylosis. ?  ?IMPRESSION: ?1. Nodular focus of consolidation in the medial basilar right lower ?lobe is increased in size. While this may represent evolving ?masslike radiation fibrosis, recurrence of the lung metastasis is ?not excluded. PET-CT would be useful for further evaluation. ?Otherwise, continued close chest CT surveillance is advised . ?2. No additional potential new sites of metastatic disease in the ?chest, abdomen or pelvis. ?3. No evidence of local tumor recurrence at the distal colonic ?anastomosis. ?4. Left main and 3 vessel coronary atherosclerosis . ?5. Aortic Atherosclerosis (ICD10-I70.0) and Emphysema (ICD10-J43.9).  ? ? ?  ?11/30/2016 Relapse/Recurrence  ? PET: ?IMPRESSION: ?1. Enlarging right lower lobe pulmonary nodule and slight increase ?in the SUV max suggesting residual neoplasm. No new pulmonary ?lesions. ?2. No findings for local recurrence or abdominal/pelvic metastatic ?disease. ?  ?01/02/2017 - 01/12/2017 Radiation Therapy  ?  ?The patient saw Dr. [name] for SBRT radiation treatment. The tumor in the right lung was treated with a course of stereotactic body radiation treatment. The patient received 50 Gy In 5 fractions at 10 Gy per fraction. ? ? ? ?  ?10/05/2017 - 10/05/2017 Chemotherapy  ? The patient had bevacizumab (AVASTIN) 900 mg in sodium chloride 0.9 % 100 mL chemo infusion, 7.3 mg/kg = 925 mg, Intravenous,  Once, 1 of 3 cycles ?Administration: 900 mg  (10/05/2017) ? ? for chemotherapy treatment.  ? ?  ?  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 Lung metastasis ([place]) ?  ?03/28/2021 -  Chemotherapy  ? Patient is on Treatment Plan :  [facility]

## 2021-05-10 NOTE — Progress Notes (Signed)
Patient is taking Lonsurf as prescribed.  He has not missed any doses and reports no side effects at this time.   ?

## 2021-05-11 ENCOUNTER — Other Ambulatory Visit (HOSPITAL_COMMUNITY): Payer: Self-pay | Admitting: *Deleted

## 2021-05-11 ENCOUNTER — Telehealth (HOSPITAL_COMMUNITY): Payer: Self-pay | Admitting: *Deleted

## 2021-05-11 ENCOUNTER — Other Ambulatory Visit (HOSPITAL_COMMUNITY): Payer: Self-pay

## 2021-05-11 DIAGNOSIS — C7801 Secondary malignant neoplasm of right lung: Secondary | ICD-10-CM

## 2021-05-11 DIAGNOSIS — D649 Anemia, unspecified: Secondary | ICD-10-CM

## 2021-05-11 MED ORDER — LONSURF 20-8.19 MG PO TABS: ORAL_TABLET | ORAL | 0 refills | Status: DC

## 2021-05-11 NOTE — Telephone Encounter (Signed)
Lonsurf prescription forwarded to [facility] per Pharmacy ?

## 2021-05-11 NOTE — Telephone Encounter (Signed)
Received tc from patient concerned that [facility] had been sent to [facility].  [place] @ [phone] and confirmed that patient will receive monthly calls from [facility] and will be shipped free of charge from company per assistance program.  Patient aware and has set up delivery of medication. ?

## 2021-05-12 ENCOUNTER — Other Ambulatory Visit (HOSPITAL_COMMUNITY): Payer: Self-pay

## 2021-05-23 ENCOUNTER — Inpatient Hospital Stay (HOSPITAL_COMMUNITY): Payer: Medicare Other

## 2021-05-23 ENCOUNTER — Encounter (HOSPITAL_COMMUNITY): Payer: Self-pay

## 2021-05-23 VITALS — BP 153/62 | HR 88 | Temp 97.0°F | Resp 18

## 2021-05-23 DIAGNOSIS — D649 Anemia, unspecified: Secondary | ICD-10-CM

## 2021-05-23 DIAGNOSIS — C189 Malignant neoplasm of colon, unspecified: Secondary | ICD-10-CM

## 2021-05-23 DIAGNOSIS — C7801 Secondary malignant neoplasm of right lung: Secondary | ICD-10-CM

## 2021-05-23 DIAGNOSIS — Z5112 Encounter for antineoplastic immunotherapy: Secondary | ICD-10-CM | POA: Diagnosis not present

## 2021-05-23 LAB — COMPREHENSIVE METABOLIC PANEL
ALT: 19 U/L (ref 0–44)
AST: 26 U/L (ref 15–41)
Albumin: 3 g/dL — ABNORMAL LOW (ref 3.5–5.0)
Alkaline Phosphatase: 102 U/L (ref 38–126)
Anion gap: 7 (ref 5–15)
BUN: 23 mg/dL (ref 8–23)
CO2: 23 mmol/L (ref 22–32)
Calcium: 7.9 mg/dL — ABNORMAL LOW (ref 8.9–10.3)
Chloride: 107 mmol/L (ref 98–111)
Creatinine, Ser: 1.44 mg/dL — ABNORMAL HIGH (ref 0.61–1.24)
GFR, Estimated: 49 mL/min — ABNORMAL LOW (ref >60)
Glucose, Bld: 157 mg/dL — ABNORMAL HIGH (ref 70–99)
Potassium: 4 mmol/L (ref 3.5–5.1)
Sodium: 137 mmol/L (ref 135–145)
Total Bilirubin: 0.5 mg/dL (ref 0.3–1.2)
Total Protein: 6.8 g/dL (ref 6.5–8.1)

## 2021-05-23 LAB — MAGNESIUM: Magnesium: 2.1 mg/dL (ref 1.7–2.4)

## 2021-05-23 LAB — CBC WITH DIFFERENTIAL/PLATELET
Abs Immature Granulocytes: 0.03 10*3/uL (ref 0.00–0.07)
Basophils Absolute: 0 10*3/uL (ref 0.0–0.1)
Basophils Relative: 0 %
Eosinophils Absolute: 0 10*3/uL (ref 0.0–0.5)
Eosinophils Relative: 0 %
HCT: 28.6 % — ABNORMAL LOW (ref 39.0–52.0)
Hemoglobin: 9.6 g/dL — ABNORMAL LOW (ref 13.0–17.0)
Immature Granulocytes: 1 %
Lymphocytes Relative: 25 %
Lymphs Abs: 1.1 10*3/uL (ref 0.7–4.0)
MCH: 33.3 pg (ref 26.0–34.0)
MCHC: 33.6 g/dL (ref 30.0–36.0)
MCV: 99.3 fL (ref 80.0–100.0)
Monocytes Absolute: 0.9 10*3/uL (ref 0.1–1.0)
Monocytes Relative: 19 %
Neutro Abs: 2.5 10*3/uL (ref 1.7–7.7)
Neutrophils Relative %: 55 %
Platelets: 255 10*3/uL (ref 150–400)
RBC: 2.88 MIL/uL — ABNORMAL LOW (ref 4.22–5.81)
RDW: 17.1 % — ABNORMAL HIGH (ref 11.5–15.5)
WBC: 4.6 10*3/uL (ref 4.0–10.5)
nRBC: 0 % (ref 0.0–0.2)

## 2021-05-23 LAB — IRON AND TIBC
Iron: 72 ug/dL (ref 45–182)
Saturation Ratios: 22 % (ref 17.9–39.5)
TIBC: 322 ug/dL (ref 250–450)
UIBC: 250 ug/dL

## 2021-05-23 LAB — FERRITIN: Ferritin: 147 ng/mL (ref 24–336)

## 2021-05-23 MED ORDER — SODIUM CHLORIDE 0.9 % IV SOLN
5.0000 mg/kg | Freq: Once | INTRAVENOUS | Status: AC
  Administered 2021-05-23: 600 mg via INTRAVENOUS
  Filled 2021-05-23: qty 16

## 2021-05-23 MED ORDER — SODIUM CHLORIDE 0.9% FLUSH
10.0000 mL | INTRAVENOUS | Status: DC | PRN
  Administered 2021-05-23 (×2): 10 mL

## 2021-05-23 MED ORDER — SODIUM CHLORIDE 0.9 % IV SOLN: Freq: Once | INTRAVENOUS | Status: AC

## 2021-05-23 MED ORDER — HEPARIN SOD (PORK) LOCK FLUSH 100 UNIT/ML IV SOLN
500.0000 [IU] | Freq: Once | INTRAVENOUS | Status: AC | PRN
  Administered 2021-05-23: 500 [IU]

## 2021-05-23 NOTE — Patient Instructions (Signed)
[facility]  Discharge Instructions: ?Thank you for choosing [facility] to provide your oncology and hematology care.  ?If you have a lab appointment with the [facility], please come in thru the Main Entrance and check in at the main information desk. ? ?Wear comfortable clothing and clothing appropriate for easy access to any Portacath or PICC line.  ? ?We strive to give you quality time with your provider. You may need to reschedule your appointment if you arrive late (15 or more minutes).  Arriving late affects you and other patients whose appointments are after yours.  Also, if you miss three or more appointments without notifying the office, you may be dismissed from the clinic at the provider?s discretion.    ?  ?For prescription refill requests, have your pharmacy contact our office and allow 72 hours for refills to be completed.   ? ?Today you received the following chemotherapy and/or immunotherapy agents Avastin. Return next visit with urine sample. ?  ?To help prevent nausea and vomiting after your treatment, we encourage you to take your nausea medication as directed. ? ?BELOW ARE SYMPTOMS THAT SHOULD BE REPORTED IMMEDIATELY: ?*FEVER GREATER THAN 100.4 F (38 ?C) OR HIGHER ?*CHILLS OR SWEATING ?*NAUSEA AND VOMITING THAT IS NOT CONTROLLED WITH YOUR NAUSEA MEDICATION ?*UNUSUAL SHORTNESS OF BREATH ?*UNUSUAL BRUISING OR BLEEDING ?*URINARY PROBLEMS (pain or burning when urinating, or frequent urination) ?*BOWEL PROBLEMS (unusual diarrhea, constipation, pain near the anus) ?TENDERNESS IN MOUTH AND THROAT WITH OR WITHOUT PRESENCE OF ULCERS (sore throat, sores in mouth, or a toothache) ?UNUSUAL RASH, SWELLING OR PAIN  ?UNUSUAL VAGINAL DISCHARGE OR ITCHING  ? ?Items with * indicate a potential emergency and should be followed up as soon as possible or go to the Emergency Department if any problems should occur. ? ?Please show the CHEMOTHERAPY ALERT CARD or IMMUNOTHERAPY ALERT CARD at  check-in to the Emergency Department and triage nurse. ? ?Should you have questions after your visit or need to cancel or reschedule your appointment, please contact [facility] [phone]  and follow the prompts.  Office hours are 8:00 a.m. to 4:30 p.m. Monday - Friday. Please note that voicemails left after 4:00 p.m. may not be returned until the following business day.  We are closed weekends and major holidays. You have access to a nurse at all times for urgent questions. Please call the main number to the clinic [phone] and follow the prompts. ? ?For any non-urgent questions, you may also contact your provider using MyChart. We now offer e-Visits for anyone 48 and older to request care online for non-urgent symptoms. For details visit [email or web address]. ?  ?Also download the MyChart app! Go to the app store, search "MyChart", open the app, select [facility], and log in with your MyChart username and password. ? ?Due to Covid, a mask is required upon entering the hospital/clinic. If you do not have a mask, one will be given to you upon arrival. For doctor visits, patients may have 1 support person aged 43 or older with them. For treatment visits, patients cannot have anyone with them due to current Covid guidelines and our immunocompromised population.  ?

## 2021-05-23 NOTE — Progress Notes (Signed)
Patient presents today for Avastin. Patient okay to treat today with urine protein, per Dr. Delton Coombes. Patient will need urine sample prior to next treatment, urine cup sent home with patient. Patient tolerated therapy with no complaints voiced. Side effects with management reviewed with understanding verbalized. Port site clean and dry with no bruising or swelling noted at site. No blood return noted before and after administration of therapy. Band aid applied. Patient left in satisfactory condition with VSS and no s/s of distress noted.  ?

## 2021-05-24 LAB — CEA: CEA: 16.3 ng/mL — ABNORMAL HIGH (ref 0.0–4.7)

## 2021-06-06 ENCOUNTER — Inpatient Hospital Stay (HOSPITAL_COMMUNITY): Payer: Medicare Other | Attending: Hematology

## 2021-06-06 ENCOUNTER — Inpatient Hospital Stay (HOSPITAL_BASED_OUTPATIENT_CLINIC_OR_DEPARTMENT_OTHER): Payer: Medicare Other | Admitting: Hematology

## 2021-06-06 ENCOUNTER — Inpatient Hospital Stay (HOSPITAL_COMMUNITY): Payer: Medicare Other

## 2021-06-06 VITALS — BP 161/77 | HR 100 | Temp 97.0°F | Resp 20 | Ht 61.0 in | Wt 244.5 lb

## 2021-06-06 DIAGNOSIS — R5383 Other fatigue: Secondary | ICD-10-CM | POA: Diagnosis not present

## 2021-06-06 DIAGNOSIS — R059 Cough, unspecified: Secondary | ICD-10-CM | POA: Diagnosis not present

## 2021-06-06 DIAGNOSIS — Z79899 Other long term (current) drug therapy: Secondary | ICD-10-CM | POA: Diagnosis not present

## 2021-06-06 DIAGNOSIS — C189 Malignant neoplasm of colon, unspecified: Secondary | ICD-10-CM

## 2021-06-06 DIAGNOSIS — R319 Hematuria, unspecified: Secondary | ICD-10-CM | POA: Insufficient documentation

## 2021-06-06 DIAGNOSIS — R32 Unspecified urinary incontinence: Secondary | ICD-10-CM | POA: Diagnosis not present

## 2021-06-06 DIAGNOSIS — M79605 Pain in left leg: Secondary | ICD-10-CM | POA: Diagnosis not present

## 2021-06-06 DIAGNOSIS — M7989 Other specified soft tissue disorders: Secondary | ICD-10-CM | POA: Insufficient documentation

## 2021-06-06 DIAGNOSIS — J439 Emphysema, unspecified: Secondary | ICD-10-CM | POA: Insufficient documentation

## 2021-06-06 DIAGNOSIS — I10 Essential (primary) hypertension: Secondary | ICD-10-CM | POA: Diagnosis not present

## 2021-06-06 DIAGNOSIS — M2550 Pain in unspecified joint: Secondary | ICD-10-CM | POA: Insufficient documentation

## 2021-06-06 DIAGNOSIS — R197 Diarrhea, unspecified: Secondary | ICD-10-CM | POA: Insufficient documentation

## 2021-06-06 DIAGNOSIS — E119 Type 2 diabetes mellitus without complications: Secondary | ICD-10-CM | POA: Diagnosis not present

## 2021-06-06 DIAGNOSIS — K7460 Unspecified cirrhosis of liver: Secondary | ICD-10-CM | POA: Diagnosis not present

## 2021-06-06 DIAGNOSIS — Z87891 Personal history of nicotine dependence: Secondary | ICD-10-CM | POA: Insufficient documentation

## 2021-06-06 DIAGNOSIS — R200 Anesthesia of skin: Secondary | ICD-10-CM | POA: Insufficient documentation

## 2021-06-06 DIAGNOSIS — R112 Nausea with vomiting, unspecified: Secondary | ICD-10-CM | POA: Diagnosis not present

## 2021-06-06 DIAGNOSIS — Z818 Family history of other mental and behavioral disorders: Secondary | ICD-10-CM | POA: Insufficient documentation

## 2021-06-06 DIAGNOSIS — C7800 Secondary malignant neoplasm of unspecified lung: Secondary | ICD-10-CM | POA: Insufficient documentation

## 2021-06-06 DIAGNOSIS — Z9049 Acquired absence of other specified parts of digestive tract: Secondary | ICD-10-CM | POA: Diagnosis not present

## 2021-06-06 DIAGNOSIS — R0602 Shortness of breath: Secondary | ICD-10-CM | POA: Diagnosis not present

## 2021-06-06 DIAGNOSIS — Z8249 Family history of ischemic heart disease and other diseases of the circulatory system: Secondary | ICD-10-CM | POA: Insufficient documentation

## 2021-06-06 DIAGNOSIS — Z86718 Personal history of other venous thrombosis and embolism: Secondary | ICD-10-CM | POA: Diagnosis not present

## 2021-06-06 DIAGNOSIS — F32A Depression, unspecified: Secondary | ICD-10-CM | POA: Insufficient documentation

## 2021-06-06 DIAGNOSIS — C7801 Secondary malignant neoplasm of right lung: Secondary | ICD-10-CM

## 2021-06-06 DIAGNOSIS — M79604 Pain in right leg: Secondary | ICD-10-CM | POA: Insufficient documentation

## 2021-06-06 DIAGNOSIS — I700 Atherosclerosis of aorta: Secondary | ICD-10-CM | POA: Diagnosis not present

## 2021-06-06 LAB — COMPREHENSIVE METABOLIC PANEL
ALT: 13 U/L (ref 0–44)
AST: 20 U/L (ref 15–41)
Albumin: 2.9 g/dL — ABNORMAL LOW (ref 3.5–5.0)
Alkaline Phosphatase: 90 U/L (ref 38–126)
Anion gap: 6 (ref 5–15)
BUN: 31 mg/dL — ABNORMAL HIGH (ref 8–23)
CO2: 26 mmol/L (ref 22–32)
Calcium: 8 mg/dL — ABNORMAL LOW (ref 8.9–10.3)
Chloride: 104 mmol/L (ref 98–111)
Creatinine, Ser: 1.45 mg/dL — ABNORMAL HIGH (ref 0.61–1.24)
GFR, Estimated: 49 mL/min — ABNORMAL LOW (ref >60)
Glucose, Bld: 159 mg/dL — ABNORMAL HIGH (ref 70–99)
Potassium: 3.8 mmol/L (ref 3.5–5.1)
Sodium: 136 mmol/L (ref 135–145)
Total Bilirubin: 0.6 mg/dL (ref 0.3–1.2)
Total Protein: 6.4 g/dL — ABNORMAL LOW (ref 6.5–8.1)

## 2021-06-06 LAB — CBC WITH DIFFERENTIAL/PLATELET
Abs Immature Granulocytes: 0.03 10*3/uL (ref 0.00–0.07)
Basophils Absolute: 0 10*3/uL (ref 0.0–0.1)
Basophils Relative: 1 %
Eosinophils Absolute: 0 10*3/uL (ref 0.0–0.5)
Eosinophils Relative: 0 %
HCT: 24.4 % — ABNORMAL LOW (ref 39.0–52.0)
Hemoglobin: 8.3 g/dL — ABNORMAL LOW (ref 13.0–17.0)
Immature Granulocytes: 1 %
Lymphocytes Relative: 27 %
Lymphs Abs: 0.6 10*3/uL — ABNORMAL LOW (ref 0.7–4.0)
MCH: 32.8 pg (ref 26.0–34.0)
MCHC: 34 g/dL (ref 30.0–36.0)
MCV: 96.4 fL (ref 80.0–100.0)
Monocytes Absolute: 0.1 10*3/uL (ref 0.1–1.0)
Monocytes Relative: 4 %
Neutro Abs: 1.5 10*3/uL — ABNORMAL LOW (ref 1.7–7.7)
Neutrophils Relative %: 67 %
Platelets: 110 10*3/uL — ABNORMAL LOW (ref 150–400)
RBC: 2.53 MIL/uL — ABNORMAL LOW (ref 4.22–5.81)
RDW: 15.1 % (ref 11.5–15.5)
WBC: 2.3 10*3/uL — ABNORMAL LOW (ref 4.0–10.5)
nRBC: 0 % (ref 0.0–0.2)

## 2021-06-06 LAB — MAGNESIUM: Magnesium: 2 mg/dL (ref 1.7–2.4)

## 2021-06-06 NOTE — Patient Instructions (Addendum)
[place] at [facility] ?Discharge Instructions ? ? ?You were seen and examined today by Dr. Delton Coombes. ? ?He reviewed your lab work which is normal/stable.  ? ?We will check a GI panel and C. Diff on your stool to make sure you don't have any infection in your stool.  ? ?We will hold your treatment today.  ? ?Return as scheduled in 3 weeks.  ? ? ? ? ?Thank you for choosing [place] at [facility] to provide your oncology and hematology care.  To afford each patient quality time with our provider, please arrive at least 15 minutes before your scheduled appointment time.  ? ?If you have a lab appointment with the [facility] please come in thru the Main Entrance and check in at the main information desk. ? ?You need to re-schedule your appointment should you arrive 10 or more minutes late.  We strive to give you quality time with our providers, and arriving late affects you and other patients whose appointments are after yours.  Also, if you no show three or more times for appointments you may be dismissed from the clinic at the providers discretion.     ?Again, thank you for choosing [name].  Our hope is that these requests will decrease the amount of time that you wait before being seen by our physicians.       ?_____________________________________________________________ ? ?Should you have questions after your visit to [facility], please contact our office at [phone] and follow the prompts.  Our office hours are 8:00 a.m. and 4:30 p.m. Monday - Friday.  Please note that voicemails left after 4:00 p.m. may not be returned until the following business day.  We are closed weekends and major holidays.  You do have access to a nurse 24-7, just call the main number to the clinic [phone] and do not press any options, hold on the line and a nurse will answer the phone.   ? ?For prescription refill requests, have your pharmacy  contact our office and allow 72 hours.   ? ?Due to Covid, you will need to wear a mask upon entering the hospital. If you do not have a mask, a mask will be given to you at the Main Entrance upon arrival. For doctor visits, patients may have 1 support person age 43 or older with them. For treatment visits, patients can not have anyone with them due to social distancing guidelines and our immunocompromised population.  ? ?   ?

## 2021-06-06 NOTE — Progress Notes (Signed)
? ?[place] ?[address] ?[place], [place] [address] ? ? ?CLINIC:  ?Medical Oncology/Hematology ? ?PCP:  ?[place], [facility] ?[place] / [place] [address] ?[phone] ? ? 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 ?PRIOR THERAPY: Stivarga 80 mg daily 09/25/2020 - 01/13/2021 ? ?NGS Results: not done ? ?CURRENT THERAPY: Bevacizumab + Trifluridine/Tipiracil q28d ? ?BRIEF ONCOLOGIC HISTORY:  ?Oncology History  ?Adenocarcinoma of colon ([place])  ?10/04/2012 Definitive Surgery  ? Partial collectomy by Dr. [name] ? ?  ?10/04/2012 Pathology Results  ? 4.5 cm poorly differentiated adenocarcinoma, negative margins, 0/7 lymph nodes for metastatic disease.  Oncotype recurrence score of 26 (high) ? ?  ? Chemotherapy  ? FOLFOX ? ?  ? Adverse Reaction  ? Progressive peripheral neuropathy ? ?  ? Chemotherapy  ? Infusional 5 FU.  Completed 6 months worth of adjuvant therapy. ? ?  ?02/23/2014 Procedure  ? Colonoscopy by Dr. Anthony Sar. ? ?  ?07/01/2015 PET scan  ? There is malignant range uptake with RLL pulmonary nodule suspicious for metastatic disease or primary pulm neoplasm. Nonspecific focus of uptake in the L femoral neck. No corresponding CT abnormality. Cannot rule out metastasis. ? ?  ?07/06/2015 Imaging  ? MR C-spine- C5-6 there is a broad-based disc bulge w R paracentral disc protrusion deforming the spinal cord. Moderate R foraminal stenosis. C6-7 there is a central disc protrusion slightly eccentric towards the R and mildly deforming the ventral c-spine  ? ?  ?09/16/2015 Procedure  ? RLL needle biopsy by IR. ? ?  ?09/17/2015 Pathology Results  ? Metastatic adenocarcinoma consistent with a primary colorectal adenocarcinoma. ? ?  ?10/12/2015 - 10/22/2015 Radiation Therapy  ? SBRT by Dr. [name] to RLL pulmonary lesion (biopsy proven to be oligometastasis). ? ?  ?12/09/2015 Imaging  ? CT chest- 1. Slight interval decrease in size of the right lower lobe ?pulmonary nodule. Some  surrounding radiation changes. ?2. No mediastinal or hilar mass or adenopathy. ?3. No new pulmonary nodules. ?4. Stable underline emphysematous changes. ?5. Cirrhotic changes involving the liver but no upper abdominal ?metastatic disease. ?  ?03/14/2016 Imaging  ? CT chest- 1. Continued mild reduction in the size of the treated right lobe ?pulmonary nodule. ?2. Increased patchy consolidation, reticulation and ground-glass ?attenuation in the basilar right lower lobe, nonspecific, favor ?evolving postradiation change, recommend attention on follow-up ?chest CT. ?3. No evidence of new or progressive metastatic disease in the chest. ?4. Aortic atherosclerosis. Left main and 3 vessel coronary ?atherosclerosis. ?5. Mild emphysema. ? ?  ?06/30/2016 Imaging  ? CT CAP- 1. The appearance of the treated right lower lobe nodule is ?essentially unchanged, and there are evolving postradiation changes ?in the base of the right lower lobe, as above. No definite signs of ?new metastatic disease are noted elsewhere in the chest, abdomen or ?pelvis. ?2. Aortic atherosclerosis, in addition to left main and 3 vessel ?coronary artery disease. Assessment for potential risk factor ?modification, dietary therapy or pharmacologic therapy may be ?warranted, if clinically indicated. ?3. Morphologic changes in the liver suggestive of cirrhosis, as ?above. ?4. Additional incidental findings, similar prior studies, as above. ? ?  ?11/17/2016 Imaging  ? CT CAP ?Study Result  ? ?CLINICAL DATA:  Stage IV colon cancer originally diagnosed in August ?2014 status post partial colectomy, with right lower lobe lung ?metastasis diagnosed August 2017 status post SBRT completed ?10/22/2015. Restaging. ?  ?EXAM: ?CT CHEST, ABDOMEN, AND PELVIS WITH CONTRAST ?  ?  TECHNIQUE: ?Multidetector CT imaging of the chest, abdomen and pelvis was ?performed following the standard protocol during bolus ?administration of intravenous contrast. ?  ?CONTRAST:  114mL ISOVUE-300  IOPAMIDOL (ISOVUE-300) INJECTION 61% ?  ?COMPARISON:  06/30/2016 CT chest, abdomen and pelvis. ?  ?FINDINGS: ?CT CHEST FINDINGS ?  ?Cardiovascular: Normal heart size. No significant pericardial ?fluid/thickening. Left main, left anterior descending, left ?circumflex and right coronary atherosclerosis. Atherosclerotic ?nonaneurysmal thoracic aorta. Normal caliber pulmonary arteries. No ?central pulmonary emboli. Right internal jugular MediPort terminates ?at the junction of the right and left brachiocephalic veins. ?  ?Mediastinum/Nodes: No discrete thyroid nodules. Unremarkable ?esophagus. No pathologically enlarged axillary, mediastinal or hilar ?lymph nodes. ?  ?Lungs/Pleura: No pneumothorax. No pleural effusion. There is a ?nodular 2.7 x 1.6 cm focus of consolidation in the medial basilar ?right lower lobe (series 3/ image 108), previously 1.6 x 1.1 cm. No ?appreciable change in patchy subpleural reticulation in both lungs ?without significant traction bronchiectasis or frank honeycombing. ?Mild centrilobular and paraseptal emphysema with mild diffuse ?bronchial wall thickening. No acute consolidative airspace disease ?or new significant pulmonary nodules. ?  ?Musculoskeletal: No aggressive appearing focal osseous lesions. ?Moderate thoracic spondylosis. Stable chronic left scapular ?deformity. ?  ?CT ABDOMEN PELVIS FINDINGS ?  ?Hepatobiliary: Normal liver with no liver mass. Cholecystectomy. No ?biliary ductal dilatation. ?  ?Pancreas: Normal, with no mass or duct dilation. ?  ?Spleen: Normal size. No mass. ?  ?Adrenals/Urinary Tract: Normal adrenals. No hydronephrosis. No renal ?masses. Normal bladder. ?  ?Stomach/Bowel: Grossly normal stomach. Normal caliber small bowel ?with no small bowel wall thickening. Stable appearance of the ?appendix, within normal limits. Stable postsurgical changes from ?partial distal colectomy with intact appearing distal colonic ?anastomosis. Oral contrast transits to the cecum.  No large bowel ?wall thickening or new pericholecystic fat stranding . ?  ?Vascular/Lymphatic: Atherosclerotic nonaneurysmal abdominal aorta. ?Patent portal, splenic, hepatic and renal veins. No pathologically ?enlarged lymph nodes in the abdomen or pelvis. ?  ?Reproductive:  Normal size prostate. ?  ?Other: No pneumoperitoneum, ascites or focal fluid collection. ?Stable subcutaneous calcified granulomas throughout the left lower ?back and gluteal regions. ?  ?Musculoskeletal: No aggressive appearing focal osseous lesions. ?Marked lumbar spondylosis. ?  ?IMPRESSION: ?1. Nodular focus of consolidation in the medial basilar right lower ?lobe is increased in size. While this may represent evolving ?masslike radiation fibrosis, recurrence of the lung metastasis is ?not excluded. PET-CT would be useful for further evaluation. ?Otherwise, continued close chest CT surveillance is advised . ?2. No additional potential new sites of metastatic disease in the ?chest, abdomen or pelvis. ?3. No evidence of local tumor recurrence at the distal colonic ?anastomosis. ?4. Left main and 3 vessel coronary atherosclerosis . ?5. Aortic Atherosclerosis (ICD10-I70.0) and Emphysema (ICD10-J43.9).  ? ? ?  ?11/30/2016 Relapse/Recurrence  ? PET: ?IMPRESSION: ?1. Enlarging right lower lobe pulmonary nodule and slight increase ?in the SUV max suggesting residual neoplasm. No new pulmonary ?lesions. ?2. No findings for local recurrence or abdominal/pelvic metastatic ?disease. ? ?  ?01/02/2017 - 01/12/2017 Radiation Therapy  ?  ?The patient saw Dr. [name] for SBRT radiation treatment. The tumor in the right lung was treated with a course of stereotactic body radiation treatment. The patient received 50 Gy In 5 fractions at 10 Gy per fraction. ? ? ? ?  ?10/05/2017 - 10/05/2017 Chemotherapy  ? The patient had bevacizumab (AVASTIN) 900 mg in sodium chloride 0.9 % 100 mL chemo infusion, 7.3 mg/kg = 925 mg, Intravenous,  Once, 1 of 3  cycles ?  Administration: 900 mg (10/05/2017) ? ? for chemotherapy treatment.  ? ?  ?Malignant neoplasm metastatic to lung [facility])  ?10/20/2015 Initial Diagnosis  ? Lung metastasis ([place]) ? ?  ?03/28/2021 -  Chemotherapy  ? Patient i

## 2021-06-06 NOTE — Progress Notes (Signed)
Patient collected urine at home, as requested 2 days ago, however left it at home.  Supplied with a new specimen cup.  Dr. Delton Coombes aware ?

## 2021-06-06 NOTE — Progress Notes (Signed)
Holding treatment today.  See oncology note.  ?

## 2021-06-07 DIAGNOSIS — C189 Malignant neoplasm of colon, unspecified: Secondary | ICD-10-CM | POA: Diagnosis not present

## 2021-06-07 LAB — C DIFFICILE QUICK SCREEN W PCR REFLEX
C Diff antigen: NEGATIVE
C Diff interpretation: NOT DETECTED
C Diff toxin: NEGATIVE

## 2021-06-10 LAB — GI PATHOGEN PANEL BY PCR, STOOL

## 2021-06-13 ENCOUNTER — Other Ambulatory Visit (HOSPITAL_COMMUNITY): Payer: Self-pay

## 2021-06-13 ENCOUNTER — Other Ambulatory Visit (HOSPITAL_COMMUNITY): Payer: Self-pay | Admitting: *Deleted

## 2021-06-13 DIAGNOSIS — C189 Malignant neoplasm of colon, unspecified: Secondary | ICD-10-CM

## 2021-06-13 DIAGNOSIS — C7801 Secondary malignant neoplasm of right lung: Secondary | ICD-10-CM

## 2021-06-13 LAB — URINALYSIS, DIPSTICK ONLY
Bilirubin Urine: NEGATIVE
Glucose, UA: NEGATIVE mg/dL
Hgb urine dipstick: NEGATIVE
Ketones, ur: NEGATIVE mg/dL
Nitrite: NEGATIVE
Protein, ur: >=300 mg/dL — AB
Specific Gravity, Urine: 1.01 (ref 1.005–1.030)
pH: 9 — ABNORMAL HIGH (ref 5.0–8.0)

## 2021-06-13 MED ORDER — LONSURF 20-8.19 MG PO TABS: ORAL_TABLET | ORAL | 0 refills | Status: DC

## 2021-06-13 NOTE — Telephone Encounter (Signed)
Per last office note, will continue lonsurf. Refills sent to pharmacy.  ? ?

## 2021-06-15 ENCOUNTER — Other Ambulatory Visit (HOSPITAL_COMMUNITY): Payer: Self-pay

## 2021-06-20 ENCOUNTER — Ambulatory Visit (HOSPITAL_COMMUNITY)
Admission: RE | Admit: 2021-06-20 | Discharge: 2021-06-20 | Disposition: A | Discharge status: Home / Self Care | Payer: Medicare Other | Source: Ambulatory Visit | Attending: Hematology | Admitting: Hematology

## 2021-06-20 DIAGNOSIS — C189 Malignant neoplasm of colon, unspecified: Secondary | ICD-10-CM | POA: Insufficient documentation

## 2021-06-20 MED ORDER — HEPARIN SOD (PORK) LOCK FLUSH 100 UNIT/ML IV SOLN
INTRAVENOUS | Status: AC
  Filled 2021-06-20: qty 5

## 2021-06-20 MED ORDER — IOHEXOL 300 MG/ML  SOLN
100.0000 mL | Freq: Once | INTRAMUSCULAR | Status: AC | PRN
  Administered 2021-06-20: 100 mL via INTRAVENOUS

## 2021-06-27 ENCOUNTER — Inpatient Hospital Stay (HOSPITAL_BASED_OUTPATIENT_CLINIC_OR_DEPARTMENT_OTHER): Payer: Medicare Other | Admitting: Hematology

## 2021-06-27 ENCOUNTER — Inpatient Hospital Stay (HOSPITAL_COMMUNITY): Payer: Medicare Other

## 2021-06-27 VITALS — BP 127/76 | HR 90 | Temp 97.0°F | Resp 20

## 2021-06-27 DIAGNOSIS — I82419 Acute embolism and thrombosis of unspecified femoral vein: Secondary | ICD-10-CM

## 2021-06-27 DIAGNOSIS — C189 Malignant neoplasm of colon, unspecified: Secondary | ICD-10-CM | POA: Diagnosis not present

## 2021-06-27 DIAGNOSIS — R7989 Other specified abnormal findings of blood chemistry: Secondary | ICD-10-CM

## 2021-06-27 DIAGNOSIS — C7801 Secondary malignant neoplasm of right lung: Secondary | ICD-10-CM

## 2021-06-27 LAB — CBC WITH DIFFERENTIAL/PLATELET
Abs Immature Granulocytes: 0.19 10*3/uL — ABNORMAL HIGH (ref 0.00–0.07)
Basophils Absolute: 0.1 10*3/uL (ref 0.0–0.1)
Basophils Relative: 1 %
Eosinophils Absolute: 0 10*3/uL (ref 0.0–0.5)
Eosinophils Relative: 0 %
HCT: 21.9 % — ABNORMAL LOW (ref 39.0–52.0)
Hemoglobin: 7 g/dL — ABNORMAL LOW (ref 13.0–17.0)
Immature Granulocytes: 2 %
Lymphocytes Relative: 7 %
Lymphs Abs: 0.8 10*3/uL (ref 0.7–4.0)
MCH: 33.7 pg (ref 26.0–34.0)
MCHC: 32 g/dL (ref 30.0–36.0)
MCV: 105.3 fL — ABNORMAL HIGH (ref 80.0–100.0)
Monocytes Absolute: 0.4 10*3/uL (ref 0.1–1.0)
Monocytes Relative: 4 %
Neutro Abs: 9.4 10*3/uL — ABNORMAL HIGH (ref 1.7–7.7)
Neutrophils Relative %: 86 %
Platelets: 411 10*3/uL — ABNORMAL HIGH (ref 150–400)
RBC: 2.08 MIL/uL — ABNORMAL LOW (ref 4.22–5.81)
RDW: 17.8 % — ABNORMAL HIGH (ref 11.5–15.5)
WBC: 10.8 10*3/uL — ABNORMAL HIGH (ref 4.0–10.5)
nRBC: 0 % (ref 0.0–0.2)

## 2021-06-27 LAB — COMPREHENSIVE METABOLIC PANEL
ALT: 9 U/L (ref 0–44)
AST: 15 U/L (ref 15–41)
Albumin: 2.3 g/dL — ABNORMAL LOW (ref 3.5–5.0)
Alkaline Phosphatase: 89 U/L (ref 38–126)
Anion gap: 3 — ABNORMAL LOW (ref 5–15)
BUN: 39 mg/dL — ABNORMAL HIGH (ref 8–23)
CO2: 23 mmol/L (ref 22–32)
Calcium: 7.7 mg/dL — ABNORMAL LOW (ref 8.9–10.3)
Chloride: 110 mmol/L (ref 98–111)
Creatinine, Ser: 1.82 mg/dL — ABNORMAL HIGH (ref 0.61–1.24)
GFR, Estimated: 37 mL/min — ABNORMAL LOW (ref >60)
Glucose, Bld: 176 mg/dL — ABNORMAL HIGH (ref 70–99)
Potassium: 4.4 mmol/L (ref 3.5–5.1)
Sodium: 136 mmol/L (ref 135–145)
Total Bilirubin: 0.4 mg/dL (ref 0.3–1.2)
Total Protein: 5.8 g/dL — ABNORMAL LOW (ref 6.5–8.1)

## 2021-06-27 LAB — PREPARE RBC (CROSSMATCH)

## 2021-06-27 LAB — MAGNESIUM: Magnesium: 2.2 mg/dL (ref 1.7–2.4)

## 2021-06-27 MED ORDER — SODIUM CHLORIDE 0.9% FLUSH
10.0000 mL | INTRAVENOUS | Status: AC | PRN
  Administered 2021-06-27: 10 mL

## 2021-06-27 MED ORDER — SODIUM CHLORIDE 0.9% IV SOLUTION
250.0000 mL | Freq: Once | INTRAVENOUS | Status: AC
  Administered 2021-06-27: 250 mL via INTRAVENOUS

## 2021-06-27 MED ORDER — SODIUM CHLORIDE 0.9 % IV SOLN: INTRAVENOUS | Status: DC

## 2021-06-27 MED ORDER — ACETAMINOPHEN 325 MG PO TABS
650.0000 mg | ORAL_TABLET | Freq: Once | ORAL | Status: AC
  Administered 2021-06-27: 650 mg via ORAL
  Filled 2021-06-27: qty 2

## 2021-06-27 MED ORDER — DIPHENHYDRAMINE HCL 25 MG PO CAPS
25.0000 mg | ORAL_CAPSULE | Freq: Once | ORAL | Status: AC
  Administered 2021-06-27: 25 mg via ORAL
  Filled 2021-06-27: qty 1

## 2021-06-27 MED ORDER — HEPARIN SOD (PORK) LOCK FLUSH 100 UNIT/ML IV SOLN
500.0000 [IU] | Freq: Every day | INTRAVENOUS | Status: AC | PRN
  Administered 2021-06-27: 500 [IU]

## 2021-06-27 NOTE — Progress Notes (Signed)
Patient tolerated transfusion and hydration with no complaints voiced.  Side effects with management reviewed with understanding verbalized.  Peripheral IV site clean and dry with no bruising or swelling noted at site.  Good blood return noted before and after administration.  Band aid applied.  Patient left in satisfactory condition with VSS and no s/s of distress noted.

## 2021-06-27 NOTE — Patient Instructions (Addendum)
[place] at [facility] Discharge Instructions  You were seen and examined today by Dr. Delton Coombes.  Dr. Delton Coombes discussed your most recent lab work and CT scan which revealed a slight increase in size of the mass in your lung.   Dr. Delton Coombes has recommended stopping the Lons[place]f.   Dr. Delton Coombes will give you blood today if available and fluids.  Follow-up as scheduled.    Thank you for choosing [place] at [facility] to provide your oncology and hematology care.  To afford each patient quality time with our provider, please arrive at least 15 minutes before your scheduled appointment time.   If you have a lab appointment with the [facility] please come in thru the Main Entrance and check in at the main information desk.  You need to re-schedule your appointment should you arrive 10 or more minutes late.  We strive to give you quality time with our providers, and arriving late affects you and other patients whose appointments are after yours.  Also, if you no show three or more times for appointments you may be dismissed from the clinic at the providers discretion.     Again, thank you for choosing [facility].  Our hope is that these requests will decrease the amount of time that you wait before being seen by our physicians.       _____________________________________________________________  Should you have questions after your visit to [facility], please contact our office at [phone] and follow the prompts.  Our office hours are 8:00 a.m. and 4:30 p.m. Monday - Friday.  Please note that voicemails left after 4:00 p.m. may not be returned until the following business day.  We are closed weekends and major holidays.  You do have access to a nurse 24-7, just call the main number to the clinic [phone] and do not press any options, hold on the line and a nurse will answer the phone.    For  prescription refill requests, have your pharmacy contact our office and allow 72 hours.    Due to Covid, you will need to wear a mask upon entering the hospital. If you do not have a mask, a mask will be given to you at the Main Entrance upon arrival. For doctor visits, patients may have 1 support person age 64 or older with them. For treatment visits, patients can not have anyone with them due to social distancing guidelines and our immunocompromised population.

## 2021-06-27 NOTE — Patient Instructions (Signed)
[facility]  Discharge Instructions: Thank you for choosing [facility] to provide your oncology and hematology care.  If you have a lab appointment with the [facility], please come in thru the Main Entrance and check in at the main information desk.  Wear comfortable clothing and clothing appropriate for easy access to any Portacath or PICC line.   We strive to give you quality time with your provider. You may need to reschedule your appointment if you arrive late (15 or more minutes).  Arriving late affects you and other patients whose appointments are after yours.  Also, if you miss three or more appointments without notifying the office, you may be dismissed from the clinic at the provider's discretion.      For prescription refill requests, have your pharmacy contact our office and allow 72 hours for refills to be completed.    Today you received the following blood transfusion.  Blood Transfusion, Adult, Care After This sheet gives you information about how to care for yourself after your procedure. Your doctor may also give you more specific instructions. If you have problems or questions, contact your doctor. What can I expect after the procedure? After the procedure, it is common to have: Bruising and soreness at the IV site. A headache. Follow these instructions at home: Insertion site care     Follow instructions from your doctor about how to take care of your insertion site. This is where an IV tube was put into your vein. Make sure you: Wash your hands with soap and water before and after you change your bandage (dressing). If you cannot use soap and water, use hand sanitizer. Change your bandage as told by your doctor. Check your insertion site every day for signs of infection. Check for: Redness, swelling, or pain. Bleeding from the site. Warmth. Pus or a bad smell. General instructions Take over-the-counter and prescription medicines only  as told by your doctor. Rest as told by your doctor. Go back to your normal activities as told by your doctor. Keep all follow-up visits as told by your doctor. This is important. Contact a doctor if: You have itching or red, swollen areas of skin (hives). You feel worried or nervous (anxious). You feel weak after doing your normal activities. You have redness, swelling, warmth, or pain around the insertion site. You have blood coming from the insertion site, and the blood does not stop with pressure. You have pus or a bad smell coming from the insertion site. Get help right away if: You have signs of a serious reaction. This may be coming from an allergy or the body's defense system (immune system). Signs include: Trouble breathing or shortness of breath. Swelling of the face or feeling warm (flushed). Fever or chills. Head, chest, or back pain. Dark pee (urine) or blood in the pee. Widespread rash. Fast heartbeat. Feeling dizzy or light-headed. You may receive your blood transfusion in an outpatient setting. If so, you will be told whom to contact to report any reactions. These symptoms may be an emergency. Do not wait to see if the symptoms will go away. Get medical help right away. Call your local emergency services (911 in the [place] Do not drive yourself to the hospital. Summary Bruising and soreness at the IV site are common. Check your insertion site every day for signs of infection. Rest as told by your doctor. Go back to your normal activities as told by your doctor. Get help right  away if you have signs of a serious reaction. This information is not intended to replace advice given to you by your health care provider. Make sure you discuss any questions you have with your health care provider. Document Revised: 05/20/2020 Document Reviewed: 07/18/2018 Elsevier Patient Education  [place].    To help prevent nausea and vomiting after your treatment, we encourage  you to take your nausea medication as directed.  BELOW ARE SYMPTOMS THAT SHOULD BE REPORTED IMMEDIATELY: *FEVER GREATER THAN 100.4 F (38 C) OR HIGHER *CHILLS OR SWEATING *NAUSEA AND VOMITING THAT IS NOT CONTROLLED WITH YOUR NAUSEA MEDICATION *UNUSUAL SHORTNESS OF BREATH *UNUSUAL BRUISING OR BLEEDING *URINARY PROBLEMS (pain or burning when urinating, or frequent urination) *BOWEL PROBLEMS (unusual diarrhea, constipation, pain near the anus) TENDERNESS IN MOUTH AND THROAT WITH OR WITHOUT PRESENCE OF ULCERS (sore throat, sores in mouth, or a toothache) UNUSUAL RASH, SWELLING OR PAIN  UNUSUAL VAGINAL DISCHARGE OR ITCHING   Items with * indicate a potential emergency and should be followed up as soon as possible or go to the Emergency Department if any problems should occur.  Please show the CHEMOTHERAPY ALERT CARD or IMMUNOTHERAPY ALERT CARD at check-in to the Emergency Department and triage nurse.  Should you have questions after your visit or need to cancel or reschedule your appointment, please contact [facility] [phone]  and follow the prompts.  Office hours are 8:00 a.m. to 4:30 p.m. Monday - Friday. Please note that voicemails left after 4:00 p.m. may not be returned until the following business day.  We are closed weekends and major holidays. You have access to a nurse at all times for urgent questions. Please call the main number to the clinic [phone] and follow the prompts.  For any non-urgent questions, you may also contact your provider using MyChart. We now offer e-Visits for anyone 33 and older to request care online for non-urgent symptoms. For details visit [email or web address].   Also download the MyChart app! Go to the app store, search "MyChart", open the app, select [facility], and log in with your MyChart username and password.  Due to Covid, a mask is required upon entering the hospital/clinic. If you do not have a mask, one will be given to you  upon arrival. For doctor visits, patients may have 1 support person aged 80 or older with them. For treatment visits, patients cannot have anyone with them due to current Covid guidelines and our immunocompromised population.

## 2021-06-27 NOTE — Progress Notes (Signed)
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 [name]    10/04/2012 Pathology Results   4.5 cm poorly differentiated adenocarcinoma, negative margins, 0/7 lymph nodes for metastatic disease.  Oncotype recurrence score of 26 (high)     Chemotherapy   FOLFOX     Adverse Reaction   Progressive peripheral neuropathy     Chemotherapy   Infusional 5 FU.  Completed 6 months worth of adjuvant therapy.    02/23/2014 Procedure   Colonoscopy by Dr. Anthony Sar.    07/01/2015 PET scan   There is malignant range uptake with RLL pulmonary nodule suspicious for metastatic disease or primary pulm neoplasm. Nonspecific focus of uptake in the L femoral neck. No corresponding CT abnormality. Cannot rule out metastasis.    07/06/2015 Imaging   MR C-spine- C5-6 there is a broad-based disc bulge w R paracentral disc protrusion deforming the spinal cord. Moderate R foraminal stenosis. C6-7 there is a central disc protrusion slightly eccentric towards the R and mildly deforming the ventral c-spine     09/16/2015 Procedure   RLL needle biopsy by IR.    09/17/2015 Pathology Results   Metastatic adenocarcinoma consistent with a primary colorectal adenocarcinoma.    10/12/2015 - 10/22/2015 Radiation Therapy   SBRT by Dr. [name] to RLL pulmonary lesion (biopsy proven to be oligometastasis).    12/09/2015 Imaging   CT chest- 1. Slight interval decrease in size of the right lower lobe pulmonary nodule. Some  surrounding radiation changes. 2. No mediastinal or hilar mass or adenopathy. 3. No new pulmonary nodules. 4. Stable underline emphysematous changes. 5. Cirrhotic changes involving the liver but no upper abdominal metastatic disease.   03/14/2016 Imaging   CT chest- 1. Continued mild reduction in the size of the treated right lobe pulmonary nodule. 2. Increased patchy consolidation, reticulation and ground-glass attenuation in the basilar right lower lobe, nonspecific, favor evolving postradiation change, recommend attention on follow-up chest CT. 3. No evidence of new or progressive metastatic disease in the chest. 4. Aortic atherosclerosis. Left main and 3 vessel coronary atherosclerosis. 5. Mild emphysema.    06/30/2016 Imaging   CT CAP- 1. The appearance of the treated right lower lobe nodule is essentially unchanged, and there are evolving postradiation changes in the base of the right lower lobe, as above. No definite signs of new metastatic disease are noted elsewhere in the chest, abdomen or pelvis. 2. Aortic atherosclerosis, in addition to left main and 3 vessel coronary artery disease. Assessment for potential risk factor modification, dietary therapy or pharmacologic therapy may be warranted, if clinically indicated. 3. Morphologic changes in the liver suggestive of cirrhosis, as above. 4. Additional incidental findings, similar prior studies, as above.    11/17/2016 Imaging   CT CAP Study Result   CLINICAL DATA:  Stage IV colon cancer originally diagnosed in August 2014 status post partial colectomy, with right lower lobe lung metastasis diagnosed August 2017 status post SBRT completed 10/22/2015. Restaging.   EXAM: CT CHEST, ABDOMEN, AND PELVIS WITH CONTRAST  TECHNIQUE: Multidetector CT imaging of the chest, abdomen and pelvis was performed following the standard protocol during bolus administration of intravenous contrast.   CONTRAST:  150m ISOVUE-300  IOPAMIDOL (ISOVUE-300) INJECTION 61%   COMPARISON:  06/30/2016 CT chest, abdomen and pelvis.   FINDINGS: CT CHEST FINDINGS   Cardiovascular: Normal heart size. No significant pericardial fluid/thickening. Left main, left anterior descending, left circumflex and right coronary atherosclerosis. Atherosclerotic nonaneurysmal thoracic aorta. Normal caliber pulmonary arteries. No central pulmonary emboli. Right internal jugular MediPort terminates at the junction of the right and left brachiocephalic veins.   Mediastinum/Nodes: No discrete thyroid nodules. Unremarkable esophagus. No pathologically enlarged axillary, mediastinal or hilar lymph nodes.   Lungs/Pleura: No pneumothorax. No pleural effusion. There is a nodular 2.7 x 1.6 cm focus of consolidation in the medial basilar right lower lobe (series 3/ image 108), previously 1.6 x 1.1 cm. No appreciable change in patchy subpleural reticulation in both lungs without significant traction bronchiectasis or frank honeycombing. Mild centrilobular and paraseptal emphysema with mild diffuse bronchial wall thickening. No acute consolidative airspace disease or new significant pulmonary nodules.   Musculoskeletal: No aggressive appearing focal osseous lesions. Moderate thoracic spondylosis. Stable chronic left scapular deformity.   CT ABDOMEN PELVIS FINDINGS   Hepatobiliary: Normal liver with no liver mass. Cholecystectomy. No biliary ductal dilatation.   Pancreas: Normal, with no mass or duct dilation.   Spleen: Normal size. No mass.   Adrenals/Urinary Tract: Normal adrenals. No hydronephrosis. No renal masses. Normal bladder.   Stomach/Bowel: Grossly normal stomach. Normal caliber small bowel with no small bowel wall thickening. Stable appearance of the appendix, within normal limits. Stable postsurgical changes from partial distal colectomy with intact appearing distal colonic anastomosis. Oral contrast transits to the cecum.  No large bowel wall thickening or new pericholecystic fat stranding .   Vascular/Lymphatic: Atherosclerotic nonaneurysmal abdominal aorta. Patent portal, splenic, hepatic and renal veins. No pathologically enlarged lymph nodes in the abdomen or pelvis.   Reproductive:  Normal size prostate.   Other: No pneumoperitoneum, ascites or focal fluid collection. Stable subcutaneous calcified granulomas throughout the left lower back and gluteal regions.   Musculoskeletal: No aggressive appearing focal osseous lesions. Marked lumbar spondylosis.   IMPRESSION: 1. Nodular focus of consolidation in the medial basilar right lower lobe is increased in size. While this may represent evolving masslike radiation fibrosis, recurrence of the lung metastasis is not excluded. PET-CT would be useful for further evaluation. Otherwise, continued close chest CT surveillance is advised . 2. No additional potential new sites of metastatic disease in the chest, abdomen or pelvis. 3. No evidence of local tumor recurrence at the distal colonic anastomosis. 4. Left main and 3 vessel coronary atherosclerosis . 5. Aortic Atherosclerosis (ICD10-I70.0) and Emphysema (ICD10-J43.9).      11/30/2016 Relapse/Recurrence   PET: IMPRESSION: 1. Enlarging right lower lobe pulmonary nodule and slight increase in the SUV max suggesting residual neoplasm. No new pulmonary lesions. 2. No findings for local recurrence or abdominal/pelvic metastatic disease.    01/02/2017 - 01/12/2017 Radiation Therapy    The patient saw Dr. MLisbeth Renshawfor SBRT radiation treatment. The tumor in the right lung was treated with a course of stereotactic body radiation treatment. The patient received 50 Gy In 5 fractions at 10 Gy per fraction.      10/05/2017 - 10/05/2017 Chemotherapy   The patient had bevacizumab (AVASTIN) 900 mg in sodium chloride 0.9 % 100 mL chemo infusion, 7.3 mg/kg = 925 mg, Intravenous,  Once, 1 of 3  cycles  Administration: 900 mg (10/05/2017)   for chemotherapy treatment.     Malignant neoplasm metastatic to lung ([place])  10/20/2015 Initial Diagnosis   Lung metastasis ([place])    03/28/2021 -  Chemotherapy   Patient is on Treatment Plan :  COLORECTAL Bevacizumab + Trifluridine/Tipiracil q28d      Colon cancer ([place])  08/24/2020 Initial Diagnosis   Colon cancer ([place])    03/28/2021 -  Chemotherapy   Patient is on Treatment Plan :  COLORECTAL Bevacizumab + Trifluridine/Tipiracil q28d        CANCER STAGING:  Cancer Staging  Adenocarcinoma of colon [facility]) Staging form: Colon and Rectum, AJCC 7th Edition - Clinical stage from 10/04/2012: Stage IIA (T3, N0, M0) - Signed by Baird Cancer, PA-C on 07/15/2015 - Pathologic stage from 09/17/2015: Stage IVA (T3, N0, M1a) - Signed by Baird Cancer, PA-C on 10/05/2015   INTERVAL HISTORY:  Mr. Gary Nelson, a 81 y.o. male, returns for routine follow-up and consideration for next cycle of chemotherapy. Gary Nelson was last seen on 06/06/2021.  Due for day #15 cycle #3 of Bevacizumab + Trifluridine today.   Overall, he tells me he has been feeling pretty well. He reports swelling in his feet and legs. He reports his SOB has worsened.  He is taking 20 mg Lasix BID. He reports intermittent shooting pains in his legs bilaterally. His appetite has worsened. He reports diarrhea with Lonsurf.   Overall, he is ready for next cycle of chemo today.   REVIEW OF SYSTEMS:  Review of Systems  Constitutional:  Positive for appetite change and fatigue.  Respiratory:  Positive for cough and shortness of breath.   Cardiovascular:  Positive for leg swelling.  Gastrointestinal:  Positive for diarrhea.  Genitourinary:  Positive for bladder incontinence.   Musculoskeletal:  Positive for arthralgias (7/10).  Neurological:  Positive for numbness.  All other systems reviewed and are negative.  PAST MEDICAL/SURGICAL HISTORY:  Past Medical History:  Diagnosis  Date   Adenocarcinoma of colon ([place])    RLL -  Pulmonary nodule (06/2015, concerning for mets vs primary; to see RAD-ON[facility] Dr. 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/28/2019   Cystitis    Diabetes mellitus without complication ([place])    Type II   DVT (deep venous thrombosis) ([place]) 2014   post op   Hematuria    Hypertension    Iron deficiency 07/31/2019   Neuropathy    Portal hypertension ([place]) 07/28/2019   Prostatitis    Pulmonary nodule    Sleep apnea    Past Surgical History:  Procedure Laterality Date   BACK SURGERY     CATARACT EXTRACTION     left   CATARACT EXTRACTION W/PHACO Right 04/11/2012   Procedure: CATARACT EXTRACTION PHACO AND INTRAOCULAR LENS PLACEMENT ([place]);  Surgeon: Tonny Branch, MD;  Location: [facility] ORS;  Service: Ophthalmology;  Laterality: Right;  CDE:62.48   CERVICAL FUSION     c4-c5   CHOLECYSTECTOMY     COLON SURGERY  09/29/12   Colon resection for cancer   COLONOSCOPY     EYE SURGERY     cataract   GIVENS CAPSULE STUDY N/A 03/15/2015   Procedure: GIVENS CAPSULE STUDY;  Surgeon: Rogene Houston, MD;  Location: [facility] ENDO SUITE;  Service: Endoscopy;  Laterality: N/A;  730   LEG SURGERY     ORIF FEMUR FRACTURE  right   POSTERIOR CERVICAL FUSION/FORAMINOTOMY N/A 07/27/2015   Procedure: Cervical four to Cervical seven Laminectomy with Cervical four to Thoracic one Dorsal internal fixation and fusion;  Surgeon: Kevan Ny Ditty, MD;  Location: [place] [facility] ORS;  Service: Neurosurgery;  Laterality: N/A;  C4 to C7 Laminectomy with C4 to T1 Dorsal internal fixation and fusion    SOCIAL HISTORY:  Social History   Socioeconomic History   Marital status: Single    Spouse name: Not on file   Number of children: Not on file   Years of education: Not on file   Highest education level: Not on file  Occupational History   Not on file  Tobacco Use   Smoking status: Former    Packs/day: 1.00     Years: 55.00    Pack years: 55.00    Types: Cigarettes    Start date: 02/07/1955   Smokeless tobacco: Never   Tobacco comments:    quit 2015  Vaping Use   Vaping Use: Never used  Substance and Sexual Activity   Alcohol use: Not Currently    Alcohol/week: 3.0 standard drinks    Types: 3 Cans of beer per week   Drug use: No   Sexual activity: Yes    Birth control/protection: None  Other Topics Concern   Not on file  Social History Narrative   Not on file   Social Determinants of Health   Financial Resource Strain: Not on file  Food Insecurity: Not on file  Transportation Needs: Not on file  Physical Activity: Not on file  Stress: Not on file  Social Connections: Not on file  Intimate Partner Violence: Not on file    FAMILY HISTORY:  Family History  Problem Relation Age of Onset   Heart disease Father    Heart attack Father    Alzheimer's disease Mother     CURRENT MEDICATIONS:  Current Outpatient Medications  Medication Sig Dispense Refill   amLODipine (NORVASC) 5 MG tablet Take 1 tablet (5 mg total) by mouth daily. 30 tablet 1   aspirin EC 81 MG tablet Take 81 mg by mouth daily.     atorvastatin (LIPITOR) 10 MG tablet Take 10 mg by mouth daily at 6 PM.      Cholecalciferol 25 MCG (1000 UT) capsule Take by mouth.     [employer] B-12 5000 MCG SUBL Place 1 tablet (5,000 mcg total) under the tongue daily. 30 tablet 6   diphenoxylate-atropine (LOMOTIL) 2.5-0.025 MG tablet Take 1 tablet by mouth 4 (four) times daily as needed for diarrhea or loose stools. 60 tablet 0   furosemide (LASIX) 20 MG tablet Take 20 mg by mouth every other day.      gabapentin (NEURONTIN) 300 MG capsule Take 2 capsules (600 mg total) by mouth 3 (three) times daily. (Patient taking differently: Take 600 mg by mouth 2 (two) times daily.) 180 capsule 2   insulin NPH (HUMULIN N,NOVOLIN N) 100 UNIT/ML injection Inject 15-30 Units into the skin See admin instructions. Per pt, 5 units every morning and 5  units in the evening     ketoconazole (NIZORAL) 2 % cream Apply topically as needed.     lidocaine-prilocaine (EMLA) cream Apply to affected area once 30 g 3   nitrofurantoin, macrocrystal-monohydrate, (MACROBID) 100 MG capsule Take 1 capsule (100 mg total) by mouth 2 (two) times daily. 14 capsule 0   OXYGEN Inhale 2 L into the lungs continuous. At night time only     phenylephrine-shark liver  oil-mineral oil-petrolatum (PREPARATION H) 0.25-14-74.9 % rectal ointment Place 1 application rectally as needed for hemorrhoids.     prochlorperazine (COMPAZINE) 10 MG tablet Take 1 tablet (10 mg total) by mouth every 6 (six) hours as needed (Nausea or vomiting). 30 tablet 1   silver sulfADIAZINE (SILVADENE) 1 % cream Apply 1 application topically as needed.     sucralfate (CARAFATE) 1 GM/10ML suspension SWISH AND SWALLOW 15ML FOUR TIMES DAILY     tamsulosin (FLOMAX) 0.4 MG CAPS capsule Take 1 capsule by mouth daily.     trifluridine-tipiracil (LONSURF) 20-8.19 MG tablet Take 3 pills twice a day 1 hr after AM & PM meals on days 1-5, 8-12. Repeat every 28 days 60 tablet 0   No current facility-administered medications for this visit.    ALLERGIES:  No Known Allergies  PHYSICAL EXAM:  Performance status (ECOG): 2 - Symptomatic, <50% confined to bed  There were no vitals filed for this visit. Wt Readings from Last 3 Encounters:  06/06/21 244 lb 8 oz (110.9 kg)  05/10/21 251 lb (113.9 kg)  04/12/21 249 lb (112.9 kg)   Physical Exam Vitals reviewed.  Constitutional:      Appearance: Normal appearance.     Comments: In wheelchair  Cardiovascular:     Rate and Rhythm: Normal rate and regular rhythm.     Pulses: Normal pulses.     Heart sounds: Normal heart sounds.  Pulmonary:     Effort: Pulmonary effort is normal.     Breath sounds: Normal breath sounds.  Neurological:     General: No focal deficit present.     Mental Status: He is alert and oriented to person, place, and time.   Psychiatric:        Mood and Affect: Mood normal.        Behavior: Behavior normal.    LABORATORY DATA:  I have reviewed the labs as listed.     Latest Ref Rng & Units 06/27/2021    9:16 AM 06/06/2021    9:41 AM 05/23/2021   11:48 AM  CBC  WBC 4.0 - 10.5 K/uL 10.8   2.3   4.6    Hemoglobin 13.0 - 17.0 g/dL 7.0   8.3   9.6    Hematocrit 39.0 - 52.0 % 21.9   24.4   28.6    Platelets 150 - 400 K/uL 411   110   255        Latest Ref Rng & Units 06/27/2021    9:16 AM 06/06/2021    9:41 AM 05/23/2021   11:48 AM  CMP  Glucose 70 - 99 mg/dL 176   159   157    BUN 8 - 23 mg/dL 39   31   23    Creatinine 0.61 - 1.24 mg/dL 1.82   1.45   1.44    Sodium 135 - 145 mmol/L 136   136   137    Potassium 3.5 - 5.1 mmol/L 4.4   3.8   4.0    Chloride 98 - 111 mmol/L 110   104   107    CO2 22 - 32 mmol/L 23   26   23     Calcium 8.9 - 10.3 mg/dL 7.7   8.0   7.9    Total Protein 6.5 - 8.1 g/dL 5.8   6.4   6.8    Total Bilirubin 0.3 - 1.2 mg/dL 0.4   0.6   0.5    Alkaline Phos  38 - 126 U/L 89   90   102    AST 15 - 41 U/L 15   20   26     ALT 0 - 44 U/L 9   13   19       DIAGNOSTIC IMAGING:  I have independently reviewed the scans and discussed with the patient. CT CHEST ABDOMEN PELVIS W CONTRAST  Result Date: 06/21/2021 CLINICAL DATA:  History of metastatic colorectal carcinoma. * Tracking Code: BO * EXAM: CT CHEST, ABDOMEN, AND PELVIS WITH CONTRAST TECHNIQUE: Multidetector CT imaging of the chest, abdomen and pelvis was performed following the standard protocol during bolus administration of intravenous contrast. RADIATION DOSE REDUCTION: This exam was performed according to the departmental dose-optimization program which includes automated exposure control, adjustment of the mA and/or kV according to patient size and/or use of iterative reconstruction technique. CONTRAST:  149m OMNIPAQUE IOHEXOL 300 MG/ML  SOLN COMPARISON:  PET-CT 01/06/2021 FINDINGS: CT CHEST FINDINGS Cardiovascular: Normal heart  size. Coronary arterial vascular calcifications. Thoracic aortic vascular calcifications. Trace fluid superior pericardial recess. Mediastinum/Nodes: No enlarged axillary, mediastinal or hilar lymphadenopathy. Normal appearance of the esophagus. Lungs/Pleura: Subpleural reticular opacities within the anterior upper lobes bilaterally, similar to prior. Slight interval increase in size of small to moderate layering right pleural effusion. No pneumothorax. Interval increase in size of medial right lower lobe mass measuring 5.5 x 4.6 cm (image 46; series 2), previously 5.2 x 3.7 cm. No additional pulmonary lesions identified. Musculoskeletal: No aggressive or acute appearing osseous lesions. Multilevel thoracic spine degenerative changes. CT ABDOMEN PELVIS FINDINGS Hepatobiliary: Liver is normal in size and contour. Prior cholecystectomy. No intrahepatic or extrahepatic biliary ductal dilatation. Pancreas: Unremarkable Spleen: Unremarkable Adrenals/Urinary Tract: Adrenal glands are normal. Perinephric fat stranding. Normal enhancement of the kidneys bilaterally. No hydronephrosis. Circumferential wall thickening of the urinary bladder. Small amount of gas in the urinary bladder. Similar-appearing 1.4 cm cyst interpolar region left kidney. Stomach/Bowel: Oral contrast material. No abnormal bowel wall thickening or evidence for bowel obstruction. Vascular/Lymphatic: Normal caliber abdominal aorta. Peripheral calcified atherosclerotic plaque. No retroperitoneal lymphadenopathy. Reproductive: Not well visualized. Other: Unchanged 7 mm peritoneal nodule (image 73; series 2). Musculoskeletal: Lumbar spine degenerative changes. No aggressive or acute appearing osseous lesions. IMPRESSION: Interval increase in size of right lower lobe mass. Slight interval increase in size of moderate right pleural effusion. No additional sites of metastasis identified within the chest, abdomen or pelvis. Aortic atherosclerosis. Nonspecific  circumferential wall thickening of the urinary bladder. Recommend clinical and laboratory correlation. Cystitis not excluded. Electronically Signed   By: DLovey NewcomerM.D.   On: 06/21/2021 12:23     ASSESSMENT:  1.  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 - Oligometastasis with right lower lobe pulmonary nodule, status post SBRT from 10/12/2015 through 10/22/2015 by Dr. MLisbeth Renshaw - Retreatment with SBRT to the right lung from 01/02/2017 through 01/12/2017. - Xeloda 2 weeks on/1 week off with 1 dose of Avastin on 10/05/2017, discontinued secondary to extensive rash in the groin and buttock region. - Stivarga 80 mg 3 weeks on/1 week off from February 2020 through 12/17/2018. -Stivarga discontinued because of unhealed wounds in the groin.  They have healed up after discontinuation. - Stivarga 80 mg 3 weeks on/1 week off from 09/25/2020 through 01/13/2021 with progression in the right lower lobe lung mass. - RAS/RAF panel on 09/06/2017 shows BRAF [id number] mutation.  K-ras and NRAS negative. - PET scan on 01/06/2021 showed slight interval increase in size of the right lower lobe pulmonary mass which remains markedly hypermetabolic.  No new sites of increased uptake in the chest, abdomen or pelvis.  Small right pleural effusion. - She was evaluated by Dr. [name] XRT.  He was not felt to be a candidate for retreatment in the same area. - We have reached out to Dr. Kathlene Cote who did not feel ablation is feasible due to the size of the lesion of being more than 3 cm. - Lonsurf 40 mg twice daily on days 1-5 and 8-12 every 28 days with bevacizumab every 2 weeks started on 03/28/2021.   2.  Chronic ulcers: -He had developed ulcers in the right upper thigh which completely healed since we stopped Stivarga.   PLAN:  1.  Stage IV colon cancer to the lungs: -Reviewed CT CAP from  06/20/2021 which showed interval increase in size of the right lower lobe mass by few millimeters.  Slight interval increase in size of moderate right pleural effusion.  No additional sites of metastatic disease. - Recommend discontinuing Lonsurf and bevacizumab. - Reviewed labs today which showed creatinine increased to 1.82 with BUN 39.  LFTs are normal.  He also has anemia with hemoglobin 7.0 with decrease in sats of 88%. - Recommend final mL of normal saline and 1 unit PRBC. - Recommend lower extremity Doppler because of worsening swelling.  He is already taking Lasix 40 mg daily.  He also reports diarrhea from [place]. - We have reviewed stool panel and C. difficile which were negative. - I will hold off on any further therapy at this time. - Treatment option includes FOLFOX with bevacizumab.  We will reevaluate him in 3 weeks and if his overall condition improves, will consider palliative chemotherapy.   Orders placed this encounter:  No orders of the defined types were placed in this encounter.    Derek Jack, MD [place] [phone]   I, Thana Ates, am acting as a scribe for Dr. Derek Jack.  I, Derek Jack MD, have reviewed the above documentation for accuracy and completeness, and I agree with the above.

## 2021-06-28 ENCOUNTER — Other Ambulatory Visit (HOSPITAL_COMMUNITY): Payer: Self-pay | Admitting: Hematology

## 2021-06-28 ENCOUNTER — Other Ambulatory Visit (HOSPITAL_COMMUNITY): Payer: Self-pay

## 2021-06-28 LAB — TYPE AND SCREEN
ABO/RH(D): A NEG
Antibody Screen: NEGATIVE
Unit division: 0

## 2021-06-28 LAB — BPAM RBC
Blood Product Expiration Date: 202306052359
ISSUE DATE / TIME: 202305221248
Unit Type and Rh: 600

## 2021-06-28 LAB — CEA: CEA: 30.5 ng/mL — ABNORMAL HIGH (ref 0.0–4.7)

## 2021-07-06 ENCOUNTER — Ambulatory Visit (HOSPITAL_COMMUNITY)
Admission: RE | Admit: 2021-07-06 | Discharge: 2021-07-06 | Disposition: A | Discharge status: Home / Self Care | Payer: Medicare Other | Source: Ambulatory Visit | Attending: Hematology | Admitting: Hematology

## 2021-07-06 DIAGNOSIS — I82419 Acute embolism and thrombosis of unspecified femoral vein: Secondary | ICD-10-CM | POA: Diagnosis present

## 2021-07-06 DIAGNOSIS — C189 Malignant neoplasm of colon, unspecified: Secondary | ICD-10-CM | POA: Insufficient documentation

## 2021-07-17 ENCOUNTER — Encounter (HOSPITAL_COMMUNITY): Payer: Self-pay | Admitting: *Deleted

## 2021-07-17 ENCOUNTER — Other Ambulatory Visit: Payer: Self-pay

## 2021-07-17 ENCOUNTER — Inpatient Hospital Stay (HOSPITAL_COMMUNITY): Payer: Medicare Other

## 2021-07-17 ENCOUNTER — Inpatient Hospital Stay (HOSPITAL_COMMUNITY)
Admission: EM | Admit: 2021-07-17 | Discharge: 2021-07-22 | DRG: 481 | Disposition: A | Discharge status: Skilled Nursing Facility | Payer: Medicare Other | Attending: Internal Medicine | Admitting: Internal Medicine

## 2021-07-17 ENCOUNTER — Emergency Department (HOSPITAL_COMMUNITY): Payer: Medicare Other

## 2021-07-17 DIAGNOSIS — M80052A Age-related osteoporosis with current pathological fracture, left femur, initial encounter for fracture: Principal | ICD-10-CM | POA: Diagnosis present

## 2021-07-17 DIAGNOSIS — L03116 Cellulitis of left lower limb: Secondary | ICD-10-CM | POA: Diagnosis present

## 2021-07-17 DIAGNOSIS — Z8249 Family history of ischemic heart disease and other diseases of the circulatory system: Secondary | ICD-10-CM

## 2021-07-17 DIAGNOSIS — I10 Essential (primary) hypertension: Secondary | ICD-10-CM | POA: Diagnosis present

## 2021-07-17 DIAGNOSIS — R251 Tremor, unspecified: Secondary | ICD-10-CM | POA: Diagnosis present

## 2021-07-17 DIAGNOSIS — Z9981 Dependence on supplemental oxygen: Secondary | ICD-10-CM

## 2021-07-17 DIAGNOSIS — S72002A Fracture of unspecified part of neck of left femur, initial encounter for closed fracture: Principal | ICD-10-CM

## 2021-07-17 DIAGNOSIS — I5032 Chronic diastolic (congestive) heart failure: Secondary | ICD-10-CM | POA: Diagnosis present

## 2021-07-17 DIAGNOSIS — I38 Endocarditis, valve unspecified: Secondary | ICD-10-CM | POA: Diagnosis not present

## 2021-07-17 DIAGNOSIS — G4733 Obstructive sleep apnea (adult) (pediatric): Secondary | ICD-10-CM | POA: Diagnosis present

## 2021-07-17 DIAGNOSIS — E1122 Type 2 diabetes mellitus with diabetic chronic kidney disease: Secondary | ICD-10-CM | POA: Diagnosis present

## 2021-07-17 DIAGNOSIS — N1832 Chronic kidney disease, stage 3b: Secondary | ICD-10-CM | POA: Diagnosis present

## 2021-07-17 DIAGNOSIS — G4730 Sleep apnea, unspecified: Secondary | ICD-10-CM | POA: Diagnosis not present

## 2021-07-17 DIAGNOSIS — Z01818 Encounter for other preprocedural examination: Secondary | ICD-10-CM | POA: Diagnosis not present

## 2021-07-17 DIAGNOSIS — S72142A Displaced intertrochanteric fracture of left femur, initial encounter for closed fracture: Secondary | ICD-10-CM | POA: Diagnosis present

## 2021-07-17 DIAGNOSIS — L03115 Cellulitis of right lower limb: Secondary | ICD-10-CM | POA: Diagnosis present

## 2021-07-17 DIAGNOSIS — Z981 Arthrodesis status: Secondary | ICD-10-CM

## 2021-07-17 DIAGNOSIS — E119 Type 2 diabetes mellitus without complications: Secondary | ICD-10-CM | POA: Diagnosis not present

## 2021-07-17 DIAGNOSIS — Z9181 History of falling: Secondary | ICD-10-CM

## 2021-07-17 DIAGNOSIS — I110 Hypertensive heart disease with heart failure: Secondary | ICD-10-CM | POA: Diagnosis not present

## 2021-07-17 DIAGNOSIS — Z820 Family history of epilepsy and other diseases of the nervous system: Secondary | ICD-10-CM

## 2021-07-17 DIAGNOSIS — I130 Hypertensive heart and chronic kidney disease with heart failure and stage 1 through stage 4 chronic kidney disease, or unspecified chronic kidney disease: Secondary | ICD-10-CM | POA: Diagnosis present

## 2021-07-17 DIAGNOSIS — D62 Acute posthemorrhagic anemia: Secondary | ICD-10-CM | POA: Diagnosis not present

## 2021-07-17 DIAGNOSIS — E8809 Other disorders of plasma-protein metabolism, not elsewhere classified: Secondary | ICD-10-CM | POA: Diagnosis present

## 2021-07-17 DIAGNOSIS — R197 Diarrhea, unspecified: Secondary | ICD-10-CM | POA: Diagnosis present

## 2021-07-17 DIAGNOSIS — T451X5A Adverse effect of antineoplastic and immunosuppressive drugs, initial encounter: Secondary | ICD-10-CM | POA: Diagnosis present

## 2021-07-17 DIAGNOSIS — N390 Urinary tract infection, site not specified: Secondary | ICD-10-CM | POA: Diagnosis present

## 2021-07-17 DIAGNOSIS — Z9049 Acquired absence of other specified parts of digestive tract: Secondary | ICD-10-CM

## 2021-07-17 DIAGNOSIS — J9611 Chronic respiratory failure with hypoxia: Secondary | ICD-10-CM | POA: Diagnosis present

## 2021-07-17 DIAGNOSIS — K7460 Unspecified cirrhosis of liver: Secondary | ICD-10-CM | POA: Diagnosis present

## 2021-07-17 DIAGNOSIS — E875 Hyperkalemia: Secondary | ICD-10-CM | POA: Diagnosis not present

## 2021-07-17 DIAGNOSIS — Z9221 Personal history of antineoplastic chemotherapy: Secondary | ICD-10-CM

## 2021-07-17 DIAGNOSIS — C7801 Secondary malignant neoplasm of right lung: Secondary | ICD-10-CM | POA: Diagnosis present

## 2021-07-17 DIAGNOSIS — E1169 Type 2 diabetes mellitus with other specified complication: Secondary | ICD-10-CM

## 2021-07-17 DIAGNOSIS — J449 Chronic obstructive pulmonary disease, unspecified: Secondary | ICD-10-CM | POA: Diagnosis present

## 2021-07-17 DIAGNOSIS — K521 Toxic gastroenteritis and colitis: Secondary | ICD-10-CM | POA: Diagnosis present

## 2021-07-17 DIAGNOSIS — I509 Heart failure, unspecified: Secondary | ICD-10-CM

## 2021-07-17 DIAGNOSIS — Z794 Long term (current) use of insulin: Secondary | ICD-10-CM

## 2021-07-17 DIAGNOSIS — I890 Lymphedema, not elsewhere classified: Secondary | ICD-10-CM | POA: Diagnosis present

## 2021-07-17 DIAGNOSIS — N400 Enlarged prostate without lower urinary tract symptoms: Secondary | ICD-10-CM | POA: Diagnosis not present

## 2021-07-17 DIAGNOSIS — Z6841 Body Mass Index (BMI) 40.0 and over, adult: Secondary | ICD-10-CM

## 2021-07-17 DIAGNOSIS — E785 Hyperlipidemia, unspecified: Secondary | ICD-10-CM | POA: Diagnosis present

## 2021-07-17 DIAGNOSIS — I455 Other specified heart block: Secondary | ICD-10-CM | POA: Diagnosis not present

## 2021-07-17 DIAGNOSIS — E1142 Type 2 diabetes mellitus with diabetic polyneuropathy: Secondary | ICD-10-CM | POA: Diagnosis present

## 2021-07-17 DIAGNOSIS — I872 Venous insufficiency (chronic) (peripheral): Secondary | ICD-10-CM | POA: Diagnosis present

## 2021-07-17 DIAGNOSIS — M62838 Other muscle spasm: Secondary | ICD-10-CM | POA: Diagnosis not present

## 2021-07-17 DIAGNOSIS — F1090 Alcohol use, unspecified, uncomplicated: Secondary | ICD-10-CM | POA: Diagnosis present

## 2021-07-17 DIAGNOSIS — J9811 Atelectasis: Secondary | ICD-10-CM | POA: Diagnosis present

## 2021-07-17 DIAGNOSIS — Z79899 Other long term (current) drug therapy: Secondary | ICD-10-CM

## 2021-07-17 DIAGNOSIS — Z7982 Long term (current) use of aspirin: Secondary | ICD-10-CM

## 2021-07-17 DIAGNOSIS — Z85038 Personal history of other malignant neoplasm of large intestine: Secondary | ICD-10-CM

## 2021-07-17 DIAGNOSIS — Z86718 Personal history of other venous thrombosis and embolism: Secondary | ICD-10-CM

## 2021-07-17 DIAGNOSIS — Z87891 Personal history of nicotine dependence: Secondary | ICD-10-CM

## 2021-07-17 DIAGNOSIS — E876 Hypokalemia: Secondary | ICD-10-CM | POA: Diagnosis not present

## 2021-07-17 DIAGNOSIS — B9620 Unspecified Escherichia coli [E. coli] as the cause of diseases classified elsewhere: Secondary | ICD-10-CM | POA: Diagnosis present

## 2021-07-17 DIAGNOSIS — L97821 Non-pressure chronic ulcer of other part of left lower leg limited to breakdown of skin: Secondary | ICD-10-CM | POA: Diagnosis present

## 2021-07-17 DIAGNOSIS — K429 Umbilical hernia without obstruction or gangrene: Secondary | ICD-10-CM | POA: Diagnosis present

## 2021-07-17 DIAGNOSIS — R8290 Unspecified abnormal findings in urine: Secondary | ICD-10-CM | POA: Diagnosis present

## 2021-07-17 DIAGNOSIS — C189 Malignant neoplasm of colon, unspecified: Secondary | ICD-10-CM | POA: Diagnosis not present

## 2021-07-17 DIAGNOSIS — S72302A Unspecified fracture of shaft of left femur, initial encounter for closed fracture: Secondary | ICD-10-CM | POA: Diagnosis present

## 2021-07-17 DIAGNOSIS — D631 Anemia in chronic kidney disease: Secondary | ICD-10-CM | POA: Diagnosis present

## 2021-07-17 DIAGNOSIS — I878 Other specified disorders of veins: Secondary | ICD-10-CM | POA: Diagnosis present

## 2021-07-17 HISTORY — DX: Disorder of kidney and ureter, unspecified: N28.9

## 2021-07-17 HISTORY — DX: Obstructive sleep apnea (adult) (pediatric): G47.33

## 2021-07-17 HISTORY — DX: Malignant neoplasm of unspecified part of unspecified bronchus or lung: C34.90

## 2021-07-17 HISTORY — DX: Heart failure, unspecified: I50.9

## 2021-07-17 HISTORY — DX: Anemia, unspecified: D64.9

## 2021-07-17 HISTORY — DX: Obstructive sleep apnea (adult) (pediatric): Z99.89

## 2021-07-17 LAB — BASIC METABOLIC PANEL
Anion gap: 5 (ref 5–15)
BUN: 24 mg/dL — ABNORMAL HIGH (ref 8–23)
CO2: 24 mmol/L (ref 22–32)
Calcium: 8 mg/dL — ABNORMAL LOW (ref 8.9–10.3)
Chloride: 111 mmol/L (ref 98–111)
Creatinine, Ser: 1.43 mg/dL — ABNORMAL HIGH (ref 0.61–1.24)
GFR, Estimated: 50 mL/min — ABNORMAL LOW (ref >60)
Glucose, Bld: 112 mg/dL — ABNORMAL HIGH (ref 70–99)
Potassium: 4.5 mmol/L (ref 3.5–5.1)
Sodium: 140 mmol/L (ref 135–145)

## 2021-07-17 LAB — CBC WITH DIFFERENTIAL/PLATELET
Abs Immature Granulocytes: 0.24 10*3/uL — ABNORMAL HIGH (ref 0.00–0.07)
Basophils Absolute: 0.1 10*3/uL (ref 0.0–0.1)
Basophils Relative: 1 %
Eosinophils Absolute: 0.1 10*3/uL (ref 0.0–0.5)
Eosinophils Relative: 2 %
HCT: 24.3 % — ABNORMAL LOW (ref 39.0–52.0)
Hemoglobin: 7.9 g/dL — ABNORMAL LOW (ref 13.0–17.0)
Immature Granulocytes: 3 %
Lymphocytes Relative: 12 %
Lymphs Abs: 1 10*3/uL (ref 0.7–4.0)
MCH: 34.8 pg — ABNORMAL HIGH (ref 26.0–34.0)
MCHC: 32.5 g/dL (ref 30.0–36.0)
MCV: 107 fL — ABNORMAL HIGH (ref 80.0–100.0)
Monocytes Absolute: 0.9 10*3/uL (ref 0.1–1.0)
Monocytes Relative: 11 %
Neutro Abs: 6 10*3/uL (ref 1.7–7.7)
Neutrophils Relative %: 71 %
Platelets: 279 10*3/uL (ref 150–400)
RBC: 2.27 MIL/uL — ABNORMAL LOW (ref 4.22–5.81)
RDW: 16.3 % — ABNORMAL HIGH (ref 11.5–15.5)
WBC: 8.3 10*3/uL (ref 4.0–10.5)
nRBC: 0 % (ref 0.0–0.2)

## 2021-07-17 LAB — TSH: TSH: 2.775 u[IU]/mL (ref 0.350–4.500)

## 2021-07-17 LAB — CBG MONITORING, ED
Glucose-Capillary: 162 mg/dL — ABNORMAL HIGH (ref 70–99)
Glucose-Capillary: 161 mg/dL — ABNORMAL HIGH (ref 70–99)

## 2021-07-17 LAB — MAGNESIUM: Magnesium: 2 mg/dL (ref 1.7–2.4)

## 2021-07-17 MED ORDER — TAMSULOSIN HCL 0.4 MG PO CAPS
0.4000 mg | ORAL_CAPSULE | Freq: Every day | ORAL | Status: DC
  Administered 2021-07-20 – 2021-07-22 (×3): 0.4 mg via ORAL
  Filled 2021-07-17 (×3): qty 1

## 2021-07-17 MED ORDER — ONDANSETRON HCL 4 MG/2ML IJ SOLN
4.0000 mg | Freq: Once | INTRAMUSCULAR | Status: AC
  Administered 2021-07-17: 4 mg via INTRAVENOUS
  Filled 2021-07-17: qty 2

## 2021-07-17 MED ORDER — HEPARIN SODIUM (PORCINE) 5000 UNIT/ML IJ SOLN: 5000.0000 [IU] | Freq: Three times a day (TID) | INTRAMUSCULAR | Status: DC

## 2021-07-17 MED ORDER — HYDROMORPHONE HCL 1 MG/ML IJ SOLN
1.0000 mg | INTRAMUSCULAR | Status: DC | PRN
  Administered 2021-07-17 – 2021-07-18 (×4): 1 mg via INTRAVENOUS
  Filled 2021-07-17 (×4): qty 1

## 2021-07-17 MED ORDER — MORPHINE SULFATE (PF) 2 MG/ML IV SOLN
4.0000 mg | Freq: Once | INTRAVENOUS | Status: AC
  Administered 2021-07-17: 4 mg via INTRAVENOUS
  Filled 2021-07-17: qty 2

## 2021-07-17 MED ORDER — ATORVASTATIN CALCIUM 10 MG PO TABS
10.0000 mg | ORAL_TABLET | Freq: Every day | ORAL | Status: DC
  Administered 2021-07-18 – 2021-07-21 (×4): 10 mg via ORAL
  Filled 2021-07-17 (×4): qty 1

## 2021-07-17 MED ORDER — ASPIRIN 81 MG PO TBEC
81.0000 mg | DELAYED_RELEASE_TABLET | Freq: Every day | ORAL | Status: DC
  Administered 2021-07-20 – 2021-07-22 (×3): 81 mg via ORAL
  Filled 2021-07-17 (×3): qty 1

## 2021-07-17 MED ORDER — FUROSEMIDE 20 MG PO TABS
20.0000 mg | ORAL_TABLET | Freq: Two times a day (BID) | ORAL | Status: DC
  Administered 2021-07-18 – 2021-07-22 (×7): 20 mg via ORAL
  Filled 2021-07-17 (×7): qty 1

## 2021-07-17 MED ORDER — HEPARIN SODIUM (PORCINE) 5000 UNIT/ML IJ SOLN
5000.0000 [IU] | Freq: Once | INTRAMUSCULAR | Status: AC
  Administered 2021-07-17: 5000 [IU] via SUBCUTANEOUS
  Filled 2021-07-17: qty 1

## 2021-07-17 MED ORDER — IRBESARTAN 150 MG PO TABS
75.0000 mg | ORAL_TABLET | Freq: Every day | ORAL | Status: DC
  Administered 2021-07-20: 75 mg via ORAL
  Filled 2021-07-17: qty 1

## 2021-07-17 MED ORDER — ONDANSETRON HCL 4 MG/2ML IJ SOLN
4.0000 mg | Freq: Four times a day (QID) | INTRAMUSCULAR | Status: DC | PRN
  Administered 2021-07-17 – 2021-07-18 (×3): 4 mg via INTRAVENOUS
  Filled 2021-07-17 (×3): qty 2

## 2021-07-17 MED ORDER — ONDANSETRON HCL 4 MG PO TABS: 4.0000 mg | ORAL_TABLET | Freq: Four times a day (QID) | ORAL | Status: DC | PRN

## 2021-07-17 MED ORDER — MORPHINE SULFATE (PF) 4 MG/ML IV SOLN
4.0000 mg | Freq: Once | INTRAVENOUS | Status: AC
  Administered 2021-07-17: 4 mg via INTRAVENOUS
  Filled 2021-07-17: qty 1

## 2021-07-17 MED ORDER — INSULIN ASPART 100 UNIT/ML IJ SOLN
0.0000 [IU] | INTRAMUSCULAR | Status: DC
  Administered 2021-07-17: 2 [IU] via SUBCUTANEOUS
  Administered 2021-07-18: 1 [IU] via SUBCUTANEOUS
  Administered 2021-07-19: 5 [IU] via SUBCUTANEOUS
  Administered 2021-07-19 – 2021-07-20 (×3): 2 [IU] via SUBCUTANEOUS
  Administered 2021-07-20: 1 [IU] via SUBCUTANEOUS
  Administered 2021-07-21: 2 [IU] via SUBCUTANEOUS
  Administered 2021-07-21 (×2): 1 [IU] via SUBCUTANEOUS
  Administered 2021-07-21 (×2): 2 [IU] via SUBCUTANEOUS
  Administered 2021-07-22: 1 [IU] via SUBCUTANEOUS
  Filled 2021-07-17: qty 1

## 2021-07-17 MED ORDER — ACETAMINOPHEN 325 MG PO TABS
650.0000 mg | ORAL_TABLET | Freq: Four times a day (QID) | ORAL | Status: DC | PRN
Start: 2021-07-17 — End: 2021-07-19

## 2021-07-17 MED ORDER — ACETAMINOPHEN 650 MG RE SUPP: 650.0000 mg | Freq: Four times a day (QID) | RECTAL | Status: DC | PRN

## 2021-07-17 MED ORDER — GABAPENTIN 300 MG PO CAPS
600.0000 mg | ORAL_CAPSULE | Freq: Two times a day (BID) | ORAL | Status: DC
  Administered 2021-07-17 – 2021-07-22 (×7): 600 mg via ORAL
  Filled 2021-07-17 (×9): qty 2

## 2021-07-17 NOTE — ED Notes (Signed)
Patient called out stating that he felt as if he was going to throw up. Entered room and gave patient a vomit bag. Patient took teeth out. Patient seems shaky, CBG done. RN made aware at this time

## 2021-07-17 NOTE — Progress Notes (Signed)
Talked with patient about wearing CPAP machine pt refused "stating he is still nauseated and dry heaving and rather wait to put machine on."

## 2021-07-17 NOTE — ED Notes (Signed)
Went to do rounding pt gone to radiology 

## 2021-07-17 NOTE — ED Notes (Signed)
Pt states nausea has resolved. Offered pt something to eat since he is npo at midnight. Pt declined.

## 2021-07-17 NOTE — ED Notes (Signed)
Hospitalist made aware of bilateral lower extremities condition. Redness and swelling of bilateral lower legs. Pt states "I have cellulitis"

## 2021-07-17 NOTE — Assessment & Plan Note (Addendum)
-  Update A1c -Continue sliding scale insulin -Follow CBGs fluctuation and adjust hypoglycemic regimen as needed. 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

## 2021-07-17 NOTE — Assessment & Plan Note (Addendum)
Has 1+ pitting bilateral lower extremity edema, which patient reports fluctuates but is chronic.  History of chronic venous stasis.  Chest x-ray shows mild opacities of left lung base atelectasis may be pneumonia.  Stable and unchanged chronic cough and dyspnea.  Last echo 2014 EF of 60%. -On Lasix 20 mg twice daily, resume

## 2021-07-17 NOTE — Progress Notes (Signed)
Patient ID: [name], male   DOB: [birth year], 81 y.o.   MRN: [id number]  Past Medical History:  Diagnosis Date   Adenocarcinoma of colon ([place])    RLL -  Pulmonary nodule (06/2015, concerning for mets vs primary; to see RAD-ON[facility] Dr. 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 Doreatha Martin has agreed to see the patient in consultation if he is transferred

## 2021-07-17 NOTE — Assessment & Plan Note (Addendum)
Left femur fracture status post mechanical fall.  Did not hit his head, no loss of consciousness. -[facility] to [facility] on call  Dr Aline Brochure talked to Dr Doreatha Martin- will see in consult at [facility]. -Persistent pain with IV morphine 4 mg x 2, start IV Dilaudid 1 mg every 4 hourly as needed. -N.p.o. midnight -EKG shows 2 sinus pauses of about 1 second each . Talked to cardiology fellow- Dr. Marcelle Smiling for clearance, he will be seen by cardiology team in the morning at [facility].

## 2021-07-17 NOTE — Assessment & Plan Note (Signed)
CPAP nightly

## 2021-07-17 NOTE — Assessment & Plan Note (Signed)
Stable. -Resume Norvasc, telmisartan, Lasix, tamsulosin

## 2021-07-17 NOTE — ED Notes (Signed)
Pt tachycardic, HR 106. MD made aware

## 2021-07-17 NOTE — ED Notes (Signed)
Pt talking on cellphone

## 2021-07-17 NOTE — ED Notes (Signed)
Pt wanting nausea medication.  Hospitalist messaged and paged asking for nausea medication.

## 2021-07-17 NOTE — ED Provider Notes (Signed)
[facility] EMERGENCY DEPARTMENT Provider Note   CSN: [id number] Arrival date & time: 07/17/21  1444     History  Chief Complaint  Patient presents with   Hip Pain    Gary Nelson is a 81 y.o. male.  Patient presents chief complaint of left hip pain.  He was using his walker when he tripped and fell onto his left side and heard a crack.  Denies loss of consciousness or pain elsewhere.  No reports of fevers or cough or vomiting or diarrhea.       Home Medications Prior to Admission medications   Medication Sig Start Date End Date Taking? Authorizing Provider  amLODipine (NORVASC) 5 MG tablet TAKE ONE TABLET ONCE DAILY 06/28/21   Derek Jack, MD  aspirin EC 81 MG tablet Take 81 mg by mouth daily.    [provider]  atorvastatin (LIPITOR) 10 MG tablet Take 10 mg by mouth daily at 6 PM.  06/25/17 07/28/19  [provider]  Cholecalciferol 25 MCG (1000 UT) capsule Take by mouth. 04/09/21 04/09/22  [provider]  [employer] B-12 5000 MCG SUBL Place 1 tablet (5,000 mcg total) under the tongue daily. 12/04/19   Derek Jack, MD  diphenoxylate-atropine (LOMOTIL) 2.5-0.025 MG tablet Take 1 tablet by mouth 4 (four) times daily as needed for diarrhea or loose stools. 01/03/21   Derek Jack, MD  furosemide (LASIX) 20 MG tablet Take 20 mg by mouth every other day.     [provider]  gabapentin (NEURONTIN) 300 MG capsule Take 2 capsules (600 mg total) by mouth 3 (three) times daily. Patient taking differently: Take 600 mg by mouth 2 (two) times daily. 07/31/15   Ditty, Kevan Ny, MD  insulin NPH (HUMULIN N,NOVOLIN N) 100 UNIT/ML injection Inject 15-30 Units into the skin See admin instructions. Per pt, 5 units every morning and 5 units in the evening    [provider]  ketoconazole (NIZORAL) 2 % cream Apply topically as needed. 12/25/19   [provider]  lidocaine-prilocaine (EMLA) cream Apply to affected area once  03/25/21   Derek Jack, MD  naproxen (NAPROSYN) 500 MG tablet Take by mouth.    [provider]  nitrofurantoin, macrocrystal-monohydrate, (MACROBID) 100 MG capsule Take 1 capsule (100 mg total) by mouth 2 (two) times daily. 05/10/21   Derek Jack, MD  OXYGEN Inhale 2 L into the lungs continuous. At night time only    [provider]  phenylephrine-shark liver oil-mineral oil-petrolatum (PREPARATION H) 0.25-14-74.9 % rectal ointment Place 1 application rectally as needed for hemorrhoids.    [provider]  prochlorperazine (COMPAZINE) 10 MG tablet Take 1 tablet (10 mg total) by mouth every 6 (six) hours as needed (Nausea or vomiting). 03/25/21   Derek Jack, MD  silver sulfADIAZINE (SILVADENE) 1 % cream Apply 1 application topically as needed. 12/07/17   [provider]  sucralfate (CARAFATE) 1 GM/10ML suspension SWISH AND SWALLOW 15ML FOUR TIMES DAILY 10/12/20   [provider]  tamsulosin (FLOMAX) 0.4 MG CAPS capsule Take 1 capsule by mouth daily. 05/24/20   [provider]  telmisartan (MICARDIS) 20 MG tablet Take 20 mg by mouth daily. 06/16/21   [provider]  trifluridine-tipiracil (LONSURF) 20-8.19 MG tablet Take 3 pills twice a day 1 hr after AM & PM meals on days 1-5, 8-12. Repeat every 28 days 06/13/21   Derek Jack, MD      Allergies    Patient has no known allergies.  Review of Systems   Review of Systems  Constitutional:  Negative for fever.  HENT:  Negative for ear pain and sore throat.   Eyes:  Negative for pain.  Respiratory:  Negative for cough.   Cardiovascular:  Negative for chest pain.  Gastrointestinal:  Negative for abdominal pain.  Genitourinary:  Negative for flank pain.  Musculoskeletal:  Negative for back pain.  Skin:  Negative for color change and rash.  Neurological:  Negative for syncope.  All other systems reviewed and are negative.   Physical Exam Updated Vital  Signs BP (!) 108/47   Pulse 98   Temp 98 F (36.7 C) (Oral)   Resp 16   Ht 5\' 1"  (1.549 m)   Wt 108.9 kg   SpO2 94%   BMI 45.35 kg/m  Physical Exam Constitutional:      Appearance: He is well-developed.  HENT:     Head: Normocephalic.     Nose: Nose normal.  Eyes:     Extraocular Movements: Extraocular movements intact.  Cardiovascular:     Rate and Rhythm: Normal rate.  Pulmonary:     Effort: Pulmonary effort is normal.  Musculoskeletal:     Comments: No C or T or L-spine midline step-offs or tenderness noted.  Left lower extremity is externally rotated and shortened.  Neurovascular intact otherwise.  Pain with attempted range of motion of the left hip.  Compartments soft.  Skin:    Coloration: Skin is not jaundiced.  Neurological:     General: No focal deficit present.     Mental Status: He is alert. Mental status is at baseline.     ED Results / Procedures / Treatments   Labs (all labs ordered are listed, but only abnormal results are displayed) Labs Reviewed  CBC WITH DIFFERENTIAL/PLATELET - Abnormal; Notable for the following components:      Result Value   RBC 2.27 (*)    Hemoglobin 7.9 (*)    HCT 24.3 (*)    MCV 107.0 (*)    MCH 34.8 (*)    RDW 16.3 (*)    Abs Immature Granulocytes 0.24 (*)    All other components within normal limits  BASIC METABOLIC PANEL - Abnormal; Notable for the following components:   Glucose, Bld 112 (*)    BUN 24 (*)    Creatinine, Ser 1.43 (*)    Calcium 8.0 (*)    GFR, Estimated 50 (*)    All other components within normal limits    EKG None  Radiology DG Pelvis 1-2 Views  Result Date: 07/17/2021 CLINICAL DATA:  Fall.  Left hip pain. EXAM: PELVIS - 1-2 VIEW COMPARISON:  None Available. FINDINGS: Intertrochanteric fracture of the proximal left femur, without significant displacement. No angulation. No other fractures. Skeletal structures are diffusely demineralized. Irregularity and flattening along the lateral right  ilium consistent with a bone graft harvesting site. Hip joints, SI joints and symphysis pubis are normally aligned. 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 IMPRESSION: 1. Intertrochanteric fracture of the proximal left femur without significant displacement or angulation. Electronically Signed   By: Lajean Manes M.D.   On: 07/17/2021 16:39   DG Femur Min 2 Views Left  Result Date: 07/17/2021 CLINICAL DATA:  Fall.  Left hip pain. EXAM: LEFT FEMUR 2 VIEWS COMPARISON:  None Available. FINDINGS: On a single view, there is a nondisplaced, non comminuted intertrochanteric fracture of the proximal femur. No other fractures.  No bone  lesions. Hip and knee joints are normally aligned. Multiple rounded to oval calcifications project lateral to the left hip and hemipelvis, suspected to be injection granuloma. IMPRESSION: 1. Nondisplaced, non comminuted intertrochanteric fracture of the proximal left femur. Electronically Signed   By: Lajean Manes M.D.   On: 07/17/2021 16:37   DG Chest 1 View  Result Date: 07/17/2021 CLINICAL DATA:  Left hip pain. EXAM: CHEST  1 VIEW COMPARISON:  09/16/2015. FINDINGS: Cardiac silhouette normal in size.  No mediastinal or hilar masses. Hazy opacity at the right lung base. Remainder of the lungs is clear. Suspected small right effusion. No convincing left pleural effusion. No pneumothorax. Right anterior chest wall, internal jugular, Port-A-Cath is stable. Stable changes from a previous anterior and posterior cervical spine fusion and laminectomies. IMPRESSION: 1. Mild opacity at the right lung base suspected to be a small effusion with associated atelectasis. Consider pneumonia if there are consistent clinical findings. 2. No other evidence of acute cardiopulmonary disease. No pulmonary edema. Electronically Signed   By: Lajean Manes M.D.   On: 07/17/2021 16:35    Procedures Procedures    Medications  Ordered in ED Medications  morphine (PF) 4 MG/ML injection 4 mg (4 mg Intravenous Given 07/17/21 1551)  ondansetron (ZOFRAN) injection 4 mg (4 mg Intravenous Given 07/17/21 1551)  morphine (PF) 2 MG/ML injection 4 mg (4 mg Intravenous Given 07/17/21 1652)    ED Course/ Medical Decision Making/ A&P                           Medical Decision Making Amount and/or Complexity of Data Reviewed Labs: ordered. Radiology: ordered.  Risk Prescription drug management. Decision regarding hospitalization.   Chart review shows follow-up with oncology May 2023 for stage IV colon cancer.  Review of prior labs show persistent anemia hemoglobin around 7-8.  Work-up today included labs and imaging.  Persistent anemia noted.  X-ray concerning for left intertrochanteric fracture.  Case discussed orthopedic surgery Dr.Harrison, additional consultation for medicine admission.        Final Clinical Impression(s) / ED Diagnoses Final diagnoses:  Closed fracture of left hip, initial encounter [facility])    Rx / [place] Orders ED Discharge Orders     None         Luna Fuse, MD 07/17/21 1719

## 2021-07-17 NOTE — ED Triage Notes (Signed)
Pt brought in by [facility] for c/o witnessed fall; pt states he was using his walker when he tripped and fell landing on his left side; pt c/o pain with movement, leg has external rotation

## 2021-07-17 NOTE — Assessment & Plan Note (Signed)
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  Follows with Dr. Delton Coombes.  Pain last CT 06/20/2021, imaging showed interval increase in size of right lower lobe mass by few millimeters.  Chemotherapy has been held for now.,  He was to follow-up 6/13 with his oncologist to decide on further management.  He reports diarrhea from chemotherapy.

## 2021-07-17 NOTE — H&P (Signed)
History and Physical    Gary Nelson JJH:[id number] DOB: 1940-05-25 DOA: 07/17/2021  PCP: Ledell Noss[facility]   Patient coming from: Home  I have personally briefly reviewed patient's old medical records in [place]  Chief Complaint: Fall  HPI: Gary Nelson is a 81 y.o. male with medical history significant for diabetes mellitus, CHF, liver cirrhosis, hypertension, OSA and obesity, colon cancer. Patient was brought to the ED by EMS reports of a fall.  Patient was using his walker when he fell. He fell on his left side and had a crack.  He denies hitting his head, he did not lose consciousness.  Reports chronic bilateral lower extremity swelling, with fluctuant spots not significantly changed from baseline. Reports chronic unchanged cough, and chronic unchanged difficulty breathing.  No chest pain.   ED Course: Heart rate 97-107.  Temperature 98.  Heart rate 12- 20.  Blood pressure 180 138.  O2 sats greater than 90% on room air. Pelvic x-ray shows intertrochanteric proximal left femoral fracture without significant displacement or angulation. Subsequent CT of the left hip-acute comminuted fracture of the proximal left femur. EDP talked to orthopedist, initial plan was to admit here at [facility], but subsequently after over conversations with anesthesiology at [facility], plan is to admit to [facility] for Ortho evaluation.  Review of Systems: As per HPI all other systems reviewed and negative.  Past Medical History:  Diagnosis Date   Adenocarcinoma of colon ([place])    RLL -  Pulmonary nodule (06/2015, concerning for mets vs primary; to see RAD-ON[facility] Dr. [name]); stage 2 adenocarinoma   2014 - Colon Cancer - surgery and chemo   Blood infection    Cirrhosis of liver ([place]) 07/28/2019   Cystitis    Diabetes mellitus without complication ([place])    Type II   DVT (deep venous thrombosis) ([place]) 2014   post op   Hematuria    Hypertension    Iron deficiency 07/31/2019    Neuropathy    Portal hypertension ([place]) 07/28/2019   Prostatitis    Pulmonary nodule    Sleep apnea     Past Surgical History:  Procedure Laterality Date   BACK SURGERY     CATARACT EXTRACTION     left   CATARACT EXTRACTION W/PHACO Right 04/11/2012   Procedure: CATARACT EXTRACTION PHACO AND INTRAOCULAR LENS PLACEMENT ([place]);  Surgeon: Tonny Branch, MD;  Location: [facility] ORS;  Service: Ophthalmology;  Laterality: Right;  CDE:62.48   CERVICAL FUSION     c4-c5   CHOLECYSTECTOMY     COLON SURGERY  09/29/12   Colon resection for cancer   COLONOSCOPY     EYE SURGERY     cataract   GIVENS CAPSULE STUDY N/A 03/15/2015   Procedure: GIVENS CAPSULE STUDY;  Surgeon: Rogene Houston, MD;  Location: [facility] ENDO SUITE;  Service: Endoscopy;  Laterality: N/A;  730   LEG SURGERY     ORIF FEMUR FRACTURE     right   POSTERIOR CERVICAL FUSION/FORAMINOTOMY N/A 07/27/2015   Procedure: Cervical four to Cervical seven Laminectomy with Cervical four to Thoracic one Dorsal internal fixation and fusion;  Surgeon: Kevan Ny Ditty, MD;  Location: [place] [facility] OR[facility];  Service: Neurosurgery;  Laterality: N/A;  C4 to C7 Laminectomy with C4 to T1 Dorsal internal fixation and fusion     reports that he has quit smoking. His smoking use included cigarettes. He started smoking about 66 years ago. He has a 55.00 pack-year smoking history.  He has never used smokeless tobacco. He reports that he does not currently use alcohol after a past usage of about 3.0 standard drinks of alcohol per week. He reports that he does not use drugs.  No Known Allergies  Family History  Problem Relation Age of Onset   Heart disease Father    Heart attack Father    Alzheimer's disease Mother     Prior to Admission medications   Medication Sig Start Date End Date Taking? Authorizing Provider  amLODipine (NORVASC) 5 MG tablet TAKE ONE TABLET ONCE DAILY 06/28/21   Derek Jack, MD  aspirin EC 81 MG tablet Take 81 mg by mouth daily.    [provider]  atorvastatin (LIPITOR) 10 MG tablet Take 10 mg by mouth daily at 6 PM.  06/25/17 07/28/19  [provider]  Cholecalciferol 25 MCG (1000 UT) capsule Take by mouth. 04/09/21 04/09/22  [provider]  [employer] B-12 5000 MCG SUBL Place 1 tablet (5,000 mcg total) under the tongue daily. 12/04/19   Derek Jack, MD  diphenoxylate-atropine (LOMOTIL) 2.5-0.025 MG tablet Take 1 tablet by mouth 4 (four) times daily as needed for diarrhea or loose stools. 01/03/21   Derek Jack, MD  furosemide (LASIX) 20 MG tablet Take 20 mg by mouth every other day.     [provider]  gabapentin (NEURONTIN) 300 MG capsule Take 2 capsules (600 mg total) by mouth 3 (three) times daily. Patient taking differently: Take 600 mg by mouth 2 (two) times daily. 07/31/15   Ditty, Kevan Ny, MD  insulin NPH (HUMULIN N,NOVOLIN N) 100 UNIT/ML injection Inject 15-30 Units into the skin See admin instructions. Per pt, 5 units every morning and 5 units in the evening    [provider]  ketoconazole (NIZORAL) 2 % cream Apply topically as needed. 12/25/19   [provider]  lidocaine-prilocaine (EMLA) cream Apply to affected area once 03/25/21   Derek Jack, MD  naproxen (NAPROSYN) 500 MG tablet Take by mouth.    [provider]  nitrofurantoin, macrocrystal-monohydrate, (MACROBID) 100 MG capsule Take 1 capsule (100 mg total) by mouth 2 (two) times daily. 05/10/21   Derek Jack, MD  OXYGEN Inhale 2 L into the lungs continuous. At night time only    [provider]  phenylephrine-shark liver oil-mineral oil-petrolatum (PREPARATION H) 0.25-14-74.9 % rectal ointment Place 1 application rectally as needed for hemorrhoids.    [provider]  prochlorperazine (COMPAZINE) 10 MG tablet Take 1 tablet (10 mg total) by mouth every 6 (six) hours as needed (Nausea or vomiting). 03/25/21   Derek Jack, MD  silver sulfADIAZINE  (SILVADENE) 1 % cream Apply 1 application topically as needed. 12/07/17   [provider]  sucralfate (CARAFATE) 1 GM/10ML suspension SWISH AND SWALLOW 15ML FOUR TIMES DAILY 10/12/20   [provider]  tamsulosin (FLOMAX) 0.4 MG CAPS capsule Take 1 capsule by mouth daily. 05/24/20   [provider]  telmisartan (MICARDIS) 20 MG tablet Take 20 mg by mouth daily. 06/16/21   [provider]  trifluridine-tipiracil (LONSURF) 20-8.19 MG tablet Take 3 pills twice a day 1 hr after AM & PM meals on days 1-5, 8-12. Repeat every 28 days 06/13/21   Derek Jack, MD    Physical Exam: Vitals:   07/17/21 1453 07/17/21 1458  BP:  138/61  Pulse:  98  Resp:  18  Temp:  98 F (36.7 C)  TempSrc:  Oral  SpO2:  90%  Weight: 108.9 kg  Height: 5\' 1"  (1.549 m)     Constitutional: NAD, calm, comfortable Vitals:   07/17/21 1453 07/17/21 1458  BP:  138/61  Pulse:  98  Resp:  18  Temp:  98 F (36.7 C)  TempSrc:  Oral  SpO2:  90%  Weight: 108.9 kg   Height: 5\' 1"  (1.549 m)    Eyes: PERRL, lids and conjunctivae normal ENMT: Mucous membranes are moist. Neck: normal, supple, no masses, no thyromegaly Respiratory: clear to auscultation bilaterally, no wheezing, no crackles.  Cardiovascular:  Mild tachycardia, regular rate and rhythm, no murmurs / rubs / gallops.  1+ pitting bilateral lower extremity edema to both legs extending towards knees Abdomen: obese, no tenderness, no masses palpated. No hepatosplenomegaly. Bowel sounds positive.  Musculoskeletal: no clubbing / cyanosis. No joint deformity upper and lower extremities. Good ROM, no contractures. Normal muscle tone.  Skin: Superficial wound to left anterior shin of lower extremity, erythema, but on the whole there is no significant change in color between the both lower extremities.  He has chronic stasis changes to both lower extremities.  No differential warmth.  Tenderness present. Neurologic: No apparent  cranial involvement, moving upper extremities and right lower extremity spontaneously.  Left lower extremity not tested. Psychiatric: Normal judgment and insight. Alert and oriented x 3. Normal mood.   Labs on Admission: I have personally reviewed following labs and imaging studies  CBC: Recent Labs  Lab 07/17/21 1515  WBC 8.3  NEUTROABS 6.0  HGB 7.9*  HCT 24.3*  MCV 107.0*  PLT 007   Basic Metabolic Panel: Recent Labs  Lab 07/17/21 1515  NA 140  K 4.5  CL 111  CO2 24  GLUCOSE 112*  BUN 24*  CREATININE 1.43*  CALCIUM 8.0*    Radiological Exams on Admission: DG Pelvis 1-2 Views  Result Date: 07/17/2021 CLINICAL DATA:  Fall.  Left hip pain. EXAM: PELVIS - 1-2 VIEW COMPARISON:  None Available. FINDINGS: Intertrochanteric fracture of the proximal left femur, without significant displacement. No angulation. No other fractures. Skeletal structures are diffusely demineralized. Irregularity and flattening along the lateral right ilium consistent with a bone graft harvesting site. Hip joints, SI joints and symphysis pubis are normally aligned. 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 IMPRESSION: 1. Intertrochanteric fracture of the proximal left femur without significant displacement or angulation. Electronically Signed   By: Lajean Manes M.D.   On: 07/17/2021 16:39   DG Femur Min 2 Views Left  Result Date: 07/17/2021 CLINICAL DATA:  Fall.  Left hip pain. EXAM: LEFT FEMUR 2 VIEWS COMPARISON:  None Available. FINDINGS: On a single view, there is a nondisplaced, non comminuted intertrochanteric fracture of the proximal femur. No other fractures.  No bone lesions. Hip and knee joints are normally aligned. Multiple rounded to oval calcifications project lateral to the left hip and hemipelvis, suspected to be injection granuloma. IMPRESSION: 1. Nondisplaced, non comminuted intertrochanteric fracture of the proximal  left femur. Electronically Signed   By: Lajean Manes M.D.   On: 07/17/2021 16:37   DG Chest 1 View  Result Date: 07/17/2021 CLINICAL DATA:  Left hip pain. EXAM: CHEST  1 VIEW COMPARISON:  09/16/2015. FINDINGS: Cardiac silhouette normal in size.  No mediastinal or hilar masses. Hazy opacity at the right lung base. Remainder of the lungs is clear. Suspected small right effusion. No convincing left pleural effusion. No pneumothorax. Right anterior chest wall, internal jugular, Port-A-Cath is stable. Stable changes from a previous  anterior and posterior cervical spine fusion and laminectomies. IMPRESSION: 1. Mild opacity at the right lung base suspected to be a small effusion with associated atelectasis. Consider pneumonia if there are consistent clinical findings. 2. No other evidence of acute cardiopulmonary disease. No pulmonary edema. Electronically Signed   By: Lajean Manes M.D.   On: 07/17/2021 16:35    EKG: Independently reviewed.   Assessment/Plan Principal Problem:   Closed comminuted intertrochanteric fracture of left femur (HCC) Active Problems:   Venous insufficiency of both lower extremities   Diabetes mellitus (HCC)   Morbid obesity (HCC)   Sleep apnea   Diarrhea   CHF, chronic (HCC)   Benign essential hypertension  Assessment and Plan: * Closed comminuted intertrochanteric fracture of left femur (HCC) Left femur fracture status post mechanical fall.  Did not hit his head, no loss of consciousness. -[facility] to [facility] on call  Dr Aline Brochure talked to Dr Doreatha Martin- will see in consult at [facility]. -Persistent pain with IV morphine 4 mg x 2, start IV Dilaudid 1 mg every 4 hourly as needed. -N.p.o. midnight -EKG shows 2 sinus pauses of about 1 second each . Talked to cardiology fellow- Dr. Marcelle Smiling for clearance, he will be seen by cardiology team in the morning at [facility].   CHF, chronic (HCC) Has 1+ pitting bilateral lower extremity edema, which patient reports fluctuates  but is chronic.  History of chronic venous stasis.  Chest x-ray shows mild opacities of left lung base atelectasis may be pneumonia.  Stable and unchanged chronic cough and dyspnea.  Last echo 2014 EF of 60%. 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  Follows with Dr. Delton Coombes.  Pain last CT 06/20/2021, imaging showed interval increase in size of right lower lobe mass by few millimeters.  Chemotherapy has been held for now.,  He was to follow-up 6/13 with his oncologist to decide on further management.  He reports diarrhea from chemotherapy.  Diabetes mellitus ([place]) - SSI- S -Hold NPH 10 units twice daily for now - HgbA1c  Benign essential hypertension Stable. -Resume Norvasc, telmisartan, Lasix, tamsulosin  Sleep apnea CPAP nightly    DVT prophylaxis: Heparin x 1 for tonight, SCDS,. Pls resume DVT ppx after surgery,  per ortho recs. Code Status: Full Family Communication: None at bedside Disposition Plan: > 2 days Consults called: Ortho Admission status: Inpt tele I certify that at the point of admission it is my clinical judgment that the patient will require inpatient hospital care spanning beyond 2 midnights from the point of admission due to high intensity of service, high risk for further deterioration and high frequency of surveillance required.   Author: Bethena Roys, MD 07/17/2021 9:15 PM  For on call review [email or web address].

## 2021-07-18 DIAGNOSIS — I10 Essential (primary) hypertension: Secondary | ICD-10-CM | POA: Diagnosis not present

## 2021-07-18 DIAGNOSIS — N400 Enlarged prostate without lower urinary tract symptoms: Secondary | ICD-10-CM | POA: Diagnosis present

## 2021-07-18 DIAGNOSIS — R8290 Unspecified abnormal findings in urine: Secondary | ICD-10-CM | POA: Diagnosis present

## 2021-07-18 DIAGNOSIS — N1832 Chronic kidney disease, stage 3b: Secondary | ICD-10-CM | POA: Diagnosis present

## 2021-07-18 DIAGNOSIS — Z01818 Encounter for other preprocedural examination: Secondary | ICD-10-CM

## 2021-07-18 DIAGNOSIS — G4730 Sleep apnea, unspecified: Secondary | ICD-10-CM

## 2021-07-18 DIAGNOSIS — S72142A Displaced intertrochanteric fracture of left femur, initial encounter for closed fracture: Secondary | ICD-10-CM | POA: Diagnosis not present

## 2021-07-18 DIAGNOSIS — C189 Malignant neoplasm of colon, unspecified: Secondary | ICD-10-CM | POA: Diagnosis not present

## 2021-07-18 DIAGNOSIS — J9611 Chronic respiratory failure with hypoxia: Secondary | ICD-10-CM | POA: Diagnosis present

## 2021-07-18 DIAGNOSIS — E785 Hyperlipidemia, unspecified: Secondary | ICD-10-CM | POA: Diagnosis present

## 2021-07-18 DIAGNOSIS — I5032 Chronic diastolic (congestive) heart failure: Secondary | ICD-10-CM | POA: Diagnosis not present

## 2021-07-18 LAB — URINALYSIS, ROUTINE W REFLEX MICROSCOPIC
Bilirubin Urine: NEGATIVE
Glucose, UA: NEGATIVE mg/dL
Ketones, ur: NEGATIVE mg/dL
Nitrite: NEGATIVE
Protein, ur: 100 mg/dL — AB
Specific Gravity, Urine: 1.017 (ref 1.005–1.030)
WBC, UA: >50 WBC/hpf — ABNORMAL HIGH (ref 0–5)
pH: 7 (ref 5.0–8.0)

## 2021-07-18 LAB — BASIC METABOLIC PANEL
Anion gap: 2 — ABNORMAL LOW (ref 5–15)
BUN: 26 mg/dL — ABNORMAL HIGH (ref 8–23)
CO2: 26 mmol/L (ref 22–32)
Calcium: 7.9 mg/dL — ABNORMAL LOW (ref 8.9–10.3)
Chloride: 111 mmol/L (ref 98–111)
Creatinine, Ser: 1.45 mg/dL — ABNORMAL HIGH (ref 0.61–1.24)
GFR, Estimated: 49 mL/min — ABNORMAL LOW (ref >60)
Glucose, Bld: 124 mg/dL — ABNORMAL HIGH (ref 70–99)
Potassium: 5.3 mmol/L — ABNORMAL HIGH (ref 3.5–5.1)
Sodium: 139 mmol/L (ref 135–145)

## 2021-07-18 LAB — CBC
HCT: 24.5 % — ABNORMAL LOW (ref 39.0–52.0)
Hemoglobin: 7.8 g/dL — ABNORMAL LOW (ref 13.0–17.0)
MCH: 35.1 pg — ABNORMAL HIGH (ref 26.0–34.0)
MCHC: 31.8 g/dL (ref 30.0–36.0)
MCV: 110.4 fL — ABNORMAL HIGH (ref 80.0–100.0)
Platelets: 268 10*3/uL (ref 150–400)
RBC: 2.22 MIL/uL — ABNORMAL LOW (ref 4.22–5.81)
RDW: 16.3 % — ABNORMAL HIGH (ref 11.5–15.5)
WBC: 11 10*3/uL — ABNORMAL HIGH (ref 4.0–10.5)
nRBC: 0 % (ref 0.0–0.2)

## 2021-07-18 LAB — GLUCOSE, CAPILLARY
Glucose-Capillary: 107 mg/dL — ABNORMAL HIGH (ref 70–99)
Glucose-Capillary: 140 mg/dL — ABNORMAL HIGH (ref 70–99)

## 2021-07-18 LAB — BRAIN NATRIURETIC PEPTIDE: B Natriuretic Peptide: 71.9 pg/mL (ref 0.0–100.0)

## 2021-07-18 LAB — CBG MONITORING, ED
Glucose-Capillary: 115 mg/dL — ABNORMAL HIGH (ref 70–99)
Glucose-Capillary: 101 mg/dL — ABNORMAL HIGH (ref 70–99)
Glucose-Capillary: 94 mg/dL (ref 70–99)

## 2021-07-18 LAB — HEMOGLOBIN A1C
Hgb A1c MFr Bld: 5.4 % (ref 4.8–5.6)
Mean Plasma Glucose: 108.28 mg/dL

## 2021-07-18 MED ORDER — THIAMINE HCL 100 MG/ML IJ SOLN
100.0000 mg | Freq: Every day | INTRAMUSCULAR | Status: DC
  Filled 2021-07-18 (×2): qty 2

## 2021-07-18 MED ORDER — FOLIC ACID 1 MG PO TABS
1.0000 mg | ORAL_TABLET | Freq: Every day | ORAL | Status: DC
  Administered 2021-07-18 – 2021-07-22 (×4): 1 mg via ORAL
  Filled 2021-07-18 (×4): qty 1

## 2021-07-18 MED ORDER — METHOCARBAMOL 500 MG PO TABS
500.0000 mg | ORAL_TABLET | Freq: Four times a day (QID) | ORAL | Status: DC | PRN
  Administered 2021-07-20 – 2021-07-21 (×2): 500 mg via ORAL
  Filled 2021-07-18 (×2): qty 1

## 2021-07-18 MED ORDER — ADULT MULTIVITAMIN W/MINERALS CH
1.0000 | ORAL_TABLET | Freq: Every day | ORAL | Status: DC
  Administered 2021-07-18 – 2021-07-22 (×4): 1 via ORAL
  Filled 2021-07-18 (×4): qty 1

## 2021-07-18 MED ORDER — THIAMINE HCL 100 MG PO TABS
100.0000 mg | ORAL_TABLET | Freq: Every day | ORAL | Status: DC
  Administered 2021-07-18 – 2021-07-22 (×4): 100 mg via ORAL
  Filled 2021-07-18 (×4): qty 1

## 2021-07-18 MED ORDER — ZINC OXIDE 12.8 % EX OINT
TOPICAL_OINTMENT | Freq: Two times a day (BID) | CUTANEOUS | Status: DC
  Administered 2021-07-20: 1 via TOPICAL
  Filled 2021-07-18: qty 56.7

## 2021-07-18 MED ORDER — LORAZEPAM 1 MG PO TABS: 1.0000 mg | ORAL_TABLET | ORAL | Status: AC | PRN

## 2021-07-18 MED ORDER — HYDROMORPHONE HCL 1 MG/ML IJ SOLN: 0.5000 mg | INTRAMUSCULAR | Status: DC | PRN

## 2021-07-18 MED ORDER — HYDROCODONE-ACETAMINOPHEN 5-325 MG PO TABS
1.0000 | ORAL_TABLET | ORAL | Status: DC | PRN
  Administered 2021-07-18: 1 via ORAL
  Filled 2021-07-18: qty 1

## 2021-07-18 MED ORDER — LORAZEPAM 2 MG/ML IJ SOLN: 1.0000 mg | INTRAMUSCULAR | Status: AC | PRN

## 2021-07-18 MED ORDER — ALBUTEROL SULFATE (2.5 MG/3ML) 0.083% IN NEBU: 2.5000 mg | INHALATION_SOLUTION | RESPIRATORY_TRACT | Status: DC | PRN

## 2021-07-18 MED ORDER — SODIUM CHLORIDE 0.9 % IV SOLN
2.0000 g | INTRAVENOUS | Status: DC
  Administered 2021-07-18 – 2021-07-21 (×4): 2 g via INTRAVENOUS
  Filled 2021-07-18 (×4): qty 20

## 2021-07-18 NOTE — Progress Notes (Signed)
Pt stated he would rather wear his home cpap machine.

## 2021-07-18 NOTE — Assessment & Plan Note (Signed)
-   Patient urinalysis demonstrating dirty urine -No complaints of dysuria or hematuria -Patient is afebrile and with minimal elevation in his WBCs in the setting of a stress demargination from hip fracture. -Will recommend holding on antibiotics at this point -Follow urine culture results.

## 2021-07-18 NOTE — Assessment & Plan Note (Signed)
-  with portal hypertension  -continue current dose of diuretics -low sodium diet discussed with patient  -outpatient follow up with GI service.

## 2021-07-18 NOTE — Consult Note (Signed)
WOC Nurse Consult Note: Patient receiving care in [facility] 5N25. Consult completed remotely after review of record and images, and phone conversation with primary nurse. Reason for Consult: LLE wound, rectal buttock wound Wound type: The patient does not know how he obtained the wound to the LLE. Possibly occurred when he fell, he isn't sure. It is a partial thickness wound. Also, just above his anus he has a pink spot that slightly blanches. The patient reports that at home he is incontinent of urine and stool according to his primary nurse. Pressure Injury POA: Yes/No/NA Measurement: Wound bed: Drainage (amount, consistency, odor)  Periwound: intact Dressing procedure/placement/frequency: Cleanse the wound on LLE shin with saline. Pat dry. Place a piece of Xeroform gauze Kellie Simmering [phone]) and secure with a few turns of kerlix. Change daily AFTER SATURATING with saline.  For the anal, buttock wound I have ordered twice daily application of Triple Paste and to avoid the use of a foam dressing over the paste.  This will cause the area to be too moist.  And, I asked the [place] to order a bariatric bed with air mattress.  Monitor the wound area(s) for worsening of condition such as: Signs/symptoms of infection,  Increase in size,  Development of or worsening of odor, Development of pain, or increased pain at the affected locations.  Notify the medical team if any of these develop.  Thank you for the consult.  Discussed plan of care with the bedside nurse.  [place] nurse will not follow at this time.  Please re-consult the [facility] team if needed.  Val Riles, RN, MSN, CWOCN, CNS-BC, pager [phone]

## 2021-07-18 NOTE — Assessment & Plan Note (Addendum)
-  chronic and most likely functional and associated with chemotherapy. -patient reports using PRN lomotil

## 2021-07-18 NOTE — Progress Notes (Addendum)
PROGRESS NOTE    Gary Nelson  VQQ:[id number] DOB: 24-Aug-1940 DOA: 07/17/2021 PCP: [facility] N[facility]    Brief Narrative:   Gary Nelson is a 81 y.o. 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  Patient reports was using his walker when he fell onto his left side.  Denies striking his head or loss of consciousness.  Patient was having difficulty getting up with pain to his left hip.  In the ED, temperature 98.0 F, HR 98, RR 18, BP 138/61, SPO2 90% on 2 L nasal cannula.  WBC 8.3, hemoglobin 7.9, platelets 279.  Sodium 140, potassium 4.5, chloride 111, CO2 24, glucose 112, BUN 24, creatinine 1.43.  TSH 2.775.  Chest x-ray with mild opacity right lung base suspected to be a small effusion with associated atelectasis, no other evidence of acute cardiopulmonary disease process.  Left femur with nondisplaced, noncannulated intratrochanteric fracture of the proximal left femur.  Pelvics x-ray intratrochanteric fracture proximal left femur without displacement/angulation.  CT left hip with acute comminuted fracture proximal left femur, 4.6 cm x 2.4 cm x 8.1 cm chronic appearing collection perimuscular fluid within the soft tissues of the left lateral pelvic wall.  Apparently patient was noted to have episodes of sinus pause x2 lasting approximately 1 seconds on telemetry at [facility].  Was evaluated by the anesthesiologist with recommendation of transfer to [facility] for surgical invention and cardiac evaluation.  Admitting hospitalist consulted cardiology fellow overnight.  Patient was transferred to [facility] for further cardiac evaluation with planned surgical intervention for left hip repair on 6/13 by orthopedics.  Assessment & Plan:   Left proximal femur comminuted fracture Patient presenting to [facility] via EMS following mechanical fall at home with subsequent left hip pain.  Imaging notable for acute comminuted left proximal femur fracture. --Orthopedics following, appreciate assistance --Oxycodone 5 mg p.o. every 6 hours as needed moderate pain --Dilaudid 0.5 mg IV every 4 hours as needed severe pain --Robaxin 500 mg p.o. every 6 hours.  Muscle spasms --N.p.o. after midnight, planned IMN in the am  Sinus pause While awaiting transfer from [facility] to [facility], patient was noted to have apparently sinus pause x2 lasting 1 second.  Not on rate controlling agents currently, on amlodipine outpatient which is currently held --Cardiology consulted --Continue to monitor on telemetry  Bilateral lower extremity cellulitis with left anterior shin ulceration Patient with chronic lymphedema.  Noted left anterior shin ulceration.  Skin slightly warm to touch.  WBC count increased to 11.0 this morning, afebrile. --Wound care consult --start ceftriaxone 2 g IV every 24 hours  --Repeat CBC in a.m.  Dysuria/increased urinary frequency Patient with malodorous urine.  Reports increased urine frequency with associated dysuria. --Check urinalysis with urine culture --Starting ceftriaxone as above  Chronic hypoxic respiratory failure COPD On 2 L nasal count at baseline.  Previous history of tobacco abuse.  Not on any inhalers at baseline. --Albuterol nebs as needed --Continue supple oxygen, continuous pulse oximetry  Essential hypertension Home medications include telmisartan 20 mg p.o. daily, amlodipine 5 mg p.o. daily. --Continue telmisartan with hospital substitution of irbesartan  75 mg p.o. daily --Holding amlodipine due to possible sinus pause --Continue aspirin and  statin --Continue monitor BP closely  Chronic diastolic congestive heart failure, compensated Chest x-ray with no pulmonary edema.  TTE 09/11/2012 with LVEF 60%.  --Cardiology following as above --Continue furosemide 20 mg p.o. twice daily --Strict I's and O's and daily weights  HLD: Atorvastatin 10 mg p.o. daily  CKD stage IIIb Baseline creatinine 1.2-1.4.  Follows with nephrology outpatient, Dr. Theador Hawthorne. --Creatinine 1.45, stable. --Avoid nephrotoxins, renal dose all medications --BMP in a.m.  BPH: Flomax 0.4 mg p.o. daily  Type 2 diabetes mellitus Hemoglobin A1c 5.4, well controlled.  Home regimen includes NPH 5 units BIDAC. --Hold home NPH --Sensitive SSI for coverage CBGs qAC/HS  Peripheral neuropathy: Gabapentin 600 mg p.o. twice daily  EtOH use disorder Patient reports no alcohol use for 6 months, but very poor historian.  He does have some tremors on physical exam.  No previous history of benign essential tremor or Parkinson's disease. --CIWA protocol with symptom triggered Ativan --Multivitamin, thiamine, folic acid  Umbilical hernia: Easily reducible.  Stable.  OSA: Continue nocturnal CPAP  History of stage IV colon cancer with metastasis to lungs Follows with medical oncology outpatient, Dr. Delton Coombes.  Seen in clinic 06/27/2021.  Poorly differentiated adenocarcinoma.  Currently on Bevacizumab + Trifluridine/Tipiracil q28d.  --Outpatient follow-up with medical oncology as scheduled  Morbid obesity Body mass index is 45.35 kg/m.  Discussed with patient needs for aggressive lifestyle changes/weight loss as this complicates all facets of care.  Outpatient follow-up with PCP.     DVT prophylaxis: Place and maintain sequential compression device Start: 07/17/21 2053    Code Status: Full Code Family Communication:   Disposition Plan:  Level of care: Telemetry Medical Status is: Inpatient Remains inpatient appropriate because: Hip fracture pending surgical  intervention on 6/13, anticipate likely need of SNF placement    Consultants:  Orthopedics, Dr. Mable Fill Cardiology  Procedures:  None  Antimicrobials:  Ceftriaxone 6/12>>   Subjective: Patient seen examined bedside, resting comfortably.  Poor historian.  RN present at bedside.  Noted malodorous urine and some resting tremors.  Patient reports some issues with urination.  Seen by orthopedics with planned surgical invention tomorrow.  There was concern at [facility] regarding sinus pause, cardiology consulted for evaluation and preoperative clearance.  Asking for something to eat.  No other questions or concerns at this time.  Denies headache, no chest pain, no shortness of breath more than his typical baseline, no fever/chills/night sweats, no nausea/vomiting/diarrhea, no abdominal pain.   Objective: Vitals:   07/18/21 0925 07/18/21 1108 07/18/21 1200 07/18/21 1316  BP: (!) 143/101 (!) 117/56 97/76 (!) 130/40  Pulse: 100 100 (!) 102 (!) 106  Resp: (!) 23 17 15    Temp:    98.7 F (37.1 C)  TempSrc:    Oral  SpO2: 96% 97% 95% 91%  Weight:      Height:       No intake or output data in the 24 hours ending 07/18/21 1406 Filed Weights   07/17/21 1453  Weight: 108.9 kg    Examination:  Physical Exam: GEN: NAD, chronically ill in appearance, malodorous HEENT: NCAT, PERRL, EOMI, sclera clear, dry mucous membranes, poor dentition PULM: Slightly diminished breath sounds bilateral bases, no crackles/wheezing, normal [place]pa[place] effort without accessory muscle use, on 3 L nasal cannula with SPO2 92% at rest (baseline 2L [place]) CV: Tachycardic, regular rhythm w/o M/G/R GI: abd soft, NTND, NABS, no R/G/M MSK: 2+ pitting edema  bilateral lower extremities up to knee, left shin with ulceration, chronic skin changes with hyperpigmentation noted bilateral lower extremities with some mild warmth to touch, neurovascular intact, left lower extremity shortened and externally rotated NEURO: CN II-XII  intact, no focal deficits, sensation to light touch intact PSYCH: normal mood/affect Integumentary: Noted left shin lesion, chronic skin changes bilateral lower extremities with some mild erythema/warmth, otherwise no other concerning lesions/rashes/wounds    Data Reviewed: I have personally reviewed following labs and imaging studies  CBC: Recent Labs  Lab 07/17/21 1515 07/18/21 0518  WBC 8.3 11.0*  NEUTROABS 6.0  --   HGB 7.9* 7.8*  HCT 24.3* 24.5*  MCV 107.0* 110.4*  PLT 279 742   Basic Metabolic Panel: Recent Labs  Lab 07/17/21 1515 07/18/21 0518  NA 140 139  K 4.5 5.3*  CL 111 111  CO2 24 26  GLUCOSE 112* 124*  BUN 24* 26*  CREATININE 1.43* 1.45*  CALCIUM 8.0* 7.9*  MG 2.0  --    GFR: Estimated Creatinine Clearance: 43.8 mL/min (A) (by C-G formula based on SCr of 1.45 mg/dL (H)). Liver Function Tests: No results for input(s): "AST", "ALT", "ALKPHOS", "BILITOT", "PROT", "ALBUMIN" in the last 168 hours. No results for input(s): "LIPASE", "AMYLASE" in the last 168 hours. No results for input(s): "AMMONIA" in the last 168 hours. Coagulation Profile: No results for input(s): "INR", "PROTIME" in the last 168 hours. Cardiac Enzymes: No results for input(s): "CKTOTAL", "CKMB", "CKMBINDEX", "TROPONINI" in the last 168 hours. BNP (last 3 results) No results for input(s): "PROBNP" in the last 8760 hours. HbA1C: Recent Labs    07/18/21 0518  HGBA1C 5.4   CBG: Recent Labs  Lab 07/17/21 2047 07/17/21 2203 07/18/21 0522 07/18/21 0736 07/18/21 1123  GLUCAP 162* 161* 115* 101* 94   Lipid Profile: No results for input(s): "CHOL", "HDL", "LDLCALC", "TRIG", "CHOLHDL", "LDLDIRECT" in the last 72 hours. Thyroid Function Tests: Recent Labs    07/17/21 1515  TSH 2.775   Anemia Panel: No results for input(s): "VITAMINB12", "FOLATE", "FERRITIN", "TIBC", "IRON", "RETICCTPCT" in the last 72 hours. Sepsis Labs: No results for input(s): "PROCALCITON", "LATICACIDVEN"  in the last 168 hours.  No results found for this or any previous visit (from the past 240 hour(s)).       Radiology Studies: CT HIP LEFT WO CONTRAST  Result Date: 07/17/2021 CLINICAL DATA:  Status post fall. EXAM: CT OF THE LEFT HIP WITHOUT CONTRAST TECHNIQUE: Multidetector CT imaging of the left hip was performed according to the standard protocol. Multiplanar CT image reconstructions were also generated. RADIATION DOSE REDUCTION: This exam was performed according to the departmental dose-optimization program which includes automated exposure control, adjustment of the mA and/or kV according to patient size and/or use of iterative reconstruction technique. COMPARISON:  None Available. FINDINGS: Bones/Joint/Cartilage Acute, comminuted fracture deformity is seen extending through the inter trochanteric region of the proximal left femur. Involvement of the adjacent portion of the proximal left femoral shaft is seen. There is no evidence of dislocation. Ligaments Suboptimally assessed by CT. Muscles and Tendons Limited in evaluation and otherwise unremarkable. Soft tissues A 4.6 cm x 2.4 cm x 8.1 cm chronic appearing collection of para muscular fluid (approximately 19.97 Hounsfield units) is seen within the soft tissues of the lateral left pelvic wall. This extends to the level of the left hip. Soft tissue swelling is seen adjacent to the previously noted fracture site with chronic appearing soft tissue calcifications noted along lateral aspect of the left hip. IMPRESSION: 1.  Acute, comminuted fracture of the proximal left femur. 2. 4.6 cm x 2.4 cm x 8.1 cm chronic appearing collection of para muscular fluid within the soft tissues of the lateral left pelvic wall, as described above. Electronically Signed   By: Virgina Norfolk M.D.   On: 07/17/2021 18:12   DG Pelvis 1-2 Views  Result Date: 07/17/2021 CLINICAL DATA:  Fall.  Left hip pain. EXAM: PELVIS - 1-2 VIEW COMPARISON:  None Available. FINDINGS:  Intertrochanteric fracture of the proximal left femur, without significant displacement. No angulation. No other fractures. Skeletal structures are diffusely demineralized. Irregularity and flattening along the lateral right ilium consistent with a bone graft harvesting site. Hip joints, SI joints and symphysis pubis are normally aligned. 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 IMPRESSION: 1. Intertrochanteric fracture of the proximal left femur without significant displacement or angulation. Electronically Signed   By: Lajean Manes M.D.   On: 07/17/2021 16:39   DG Femur Min 2 Views Left  Result Date: 07/17/2021 CLINICAL DATA:  Fall.  Left hip pain. EXAM: LEFT FEMUR 2 VIEWS COMPARISON:  None Available. FINDINGS: On a single view, there is a nondisplaced, non comminuted intertrochanteric fracture of the proximal femur. No other fractures.  No bone lesions. Hip and knee joints are normally aligned. Multiple rounded to oval calcifications project lateral to the left hip and hemipelvis, suspected to be injection granuloma. IMPRESSION: 1. Nondisplaced, non comminuted intertrochanteric fracture of the proximal left femur. Electronically Signed   By: Lajean Manes M.D.   On: 07/17/2021 16:37   DG Chest 1 View  Result Date: 07/17/2021 CLINICAL DATA:  Left hip pain. EXAM: CHEST  1 VIEW COMPARISON:  09/16/2015. FINDINGS: Cardiac silhouette normal in size.  No mediastinal or hilar masses. Hazy opacity at the right lung base. Remainder of the lungs is clear. Suspected small right effusion. No convincing left pleural effusion. No pneumothorax. Right anterior chest wall, internal jugular, Port-A-Cath is stable. Stable changes from a previous anterior and posterior cervical spine fusion and laminectomies. IMPRESSION: 1. Mild opacity at the right lung base suspected to be a small effusion with associated atelectasis. Consider pneumonia if there  are consistent clinical findings. 2. No other evidence of acute cardiopulmonary disease. No pulmonary edema. Electronically Signed   By: Lajean Manes M.D.   On: 07/17/2021 16:35        Scheduled Meds:  aspirin EC  81 mg Oral Daily   atorvastatin  10 mg Oral [id number]   folic acid  1 mg Oral Daily   furosemide  20 mg Oral BID   gabapentin  600 mg Oral BID   insulin aspart  0-9 Units Subcutaneous Q4H   irbesartan  75 mg Oral Daily   multivitamin with minerals  1 tablet Oral Daily   tamsulosin  0.4 mg Oral Daily   thiamine  100 mg Oral Daily   Or   thiamine  100 mg Intravenous Daily   Continuous Infusions:  cefTRIAXone (ROCEPHIN)  IV       LOS: 1 day    Time spent: 50 minutes spent on chart review, discussion with nursing staff, consultants, updating family and interview/physical exam; more than 50% of that time was spent in counseling and/or coordination of care.    Akshar Starnes J [place] DO [facility] Available via Epic secure chat 7am-7pm After these hours, please refer to coverage provider listed on [email or web address] 07/18/2021, 2:06 PM

## 2021-07-18 NOTE — Consult Note (Addendum)
Cardiology Consultation:   Patient ID: Gary Nelson MRN: [id number]; DOB: 06-01-1940  Admit date: 07/17/2021 Date of Consult: 07/18/2021  PCP:  Ledell Noss, [place] [facility] Cardiologist:  New to [facility]    Patient Profile:   Gary Nelson is a 81 y.o. 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 Denton Brick  History of Present Illness:   Gary Nelson with above PMH scented to the ER 07/17/2021 evening with chief complaints of left hip pain after sustaining a mechanical fall at home.  He remotely saw Dr. Bronson Ing in 2014 for pre-op clearance. He has no documented cardiac disease in the past.  Last echo from 09/11/2012 revealed LVEF 60%, mild LVH, regional wall motion abnormality cannot be excluded, mild LAE, normal RV.  Lexiscan stress test was done 09/13/2012 which was normal.  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   He presented to the ER 07/17/2021 evening after suffering a mechanical fall at home.  He was using his walker but unfortunately tripped and he fell onto his left side.  He developed subsequent left hip pain.  Imaging of left hip revealed acute comminuted fracture of proximal left femur.  Admission diagnostic revealed CKD with BUN 24, creatinine 1.43 and GFR 50.  CBC with hemoglobin 7.9.  TSH WNL.Chest x-ray revealed mild opacity at the right lung base suspected to be a small effusion with associated atelectasis, pneumonia considered.  No other acute cardiopulmonary disease.  EKG revealed  sinus rhythm with ventricular rate of 92 bpm, no acute ST-T change.  Patient was evaluated by orthopedic surgery team, IM nailing is recommended tomorrow.  Cardiology is consulted for preop risk evaluation.  Upon encounter, patient states he has never experienced any chest pain. He reports using oxygen 24/7 and compliant with CPAP at night. He is not a good historian with limited insight. He states his legs are swelling ever since he started using CPAP. He reports SOB that seems worsened than usual. He has been having jerking motions of his arms for unknown duration of time. He asked "how bad is the surgery". He states his Brother Gary Nelson is his HCP. He states he does not get up and walk at baseline prior to the admission, able to dress himself and feed himself. He denied hx of CVA, MI. He states his oncologist no longer wants chemotherapy for him.    Past Medical History:  Diagnosis Date   Adenocarcinoma of colon ([place])    RLL -  Pulmonary nodule (06/2015, concerning for mets vs primary; to see RAD-ONC Dr. 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   Cystitis    Diabetes mellitus without complication ([place])    Type II   DVT (deep venous thrombosis) ([place]) 2014   post op   Hematuria    Hypertension    Iron deficiency 07/31/2019   Lung cancer ([place])  Stage IV   Neuropathy    OSA on CPAP    Portal hypertension ([place]) 07/28/2019   Prostatitis    Pulmonary nodule    Renal disorder    Sleep apnea     Past Surgical History:  Procedure Laterality Date   BACK SURGERY     CATARACT EXTRACTION     left   CATARACT EXTRACTION W/PHACO Right 04/11/2012   Procedure: CATARACT EXTRACTION PHACO AND INTRAOCULAR LENS PLACEMENT (IOC);  Surgeon: Tonny Branch, MD;  Location: [facility] ORS;  Service: Ophthalmology;  Laterality: Right;  CDE:62.48   CERVICAL FUSION     c4-c5    CHOLECYSTECTOMY     COLON SURGERY  09/29/12   Colon resection for cancer   COLONOSCOPY     EYE SURGERY     cataract   GIVENS CAPSULE STUDY N/A 03/15/2015   Procedure: GIVENS CAPSULE STUDY;  Surgeon: Rogene Houston, MD;  Location: [facility] END[facility] SUITE;  Service: Endoscopy;  Laterality: N/A;  730   LEG SURGERY     ORIF FEMUR FRACTURE     right   POSTERIOR CERVICAL FUSION/FORAMINOTOMY N/A 07/27/2015   Procedure: Cervical four to Cervical seven Laminectomy with Cervical four to Thoracic one Dorsal internal fixation and fusion;  Surgeon: Kevan Ny Ditty, MD;  Location: [facility] ORS;  Service: Neurosurgery;  Laterality: N/A;  C4 to C7 Laminectomy with C4 to T1 Dorsal internal fixation and fusion     Home Medications:  Prior to Admission medications   Medication Sig Start Date End Date Taking? Authorizing Provider  amLODipine (NORVASC) 5 MG tablet TAKE ONE TABLET ONCE DAILY Patient taking differently: Take 5 mg by mouth daily. 06/28/21  Yes Derek Jack, MD  aspirin EC 81 MG tablet Take 81 mg by mouth daily.   Yes [provider]  atorvastatin (LIPITOR) 10 MG tablet Take 10 mg by mouth daily at 6 PM.  06/25/17 07/17/21 Yes [provider]  Cholecalciferol 25 MCG (1000 UT) capsule Take 1,000 Units by mouth daily. 04/09/21 04/09/22 Yes [provider]  [facility] B-12 5000 MCG SUBL Place 1 tablet (5,000 mcg total) under the tongue daily. 12/04/19  Yes Derek Jack, MD  diphenoxylate-atropine (LOMOTIL) 2.5-0.025 MG tablet Take 1 tablet by mouth 4 (four) times daily as needed for diarrhea or loose stools. 01/03/21  Yes Derek Jack, MD  furosemide (LASIX) 20 MG tablet Take 20 mg by mouth 2 (two) times daily.   Yes [provider]  gabapentin (NEURONTIN) 300 MG capsule Take 2 capsules (600 mg total) by mouth 3 (three) times daily. Patient taking differently: Take 600 mg by mouth 2 (two) times daily. 07/31/15  Yes Ditty, Kevan Ny, MD  insulin NPH (HUMULIN  N,NOVOLIN N) 100 UNIT/ML injection Inject 10 Units into the skin 2 (two) times daily before a meal. Per pt, 5 units every morning and 5 units in the evening   Yes [provider]  silver sulfADIAZINE (SILVADENE) 1 % cream Apply 1 application topically as needed. 12/07/17  Yes [provider]  tamsulosin (FLOMAX) 0.4 MG CAPS capsule Take 1 capsule by mouth daily. 05/24/20  Yes [provider]  telmisartan (MICARDIS) 20 MG tablet Take 20 mg by mouth daily. 06/16/21  Yes [provider]  ketoconazole (NIZORAL) 2 % cream Apply topically as needed. Patient not taking: Reported on 07/17/2021 12/25/19   [provider]  lidocaine-prilocaine (EMLA) cream Apply to affected area once Patient not taking: Reported on 07/17/2021 03/25/21   Derek Jack, MD  naproxen (NAPROSYN) 500 MG tablet Take by mouth. Patient not taking: Reported on 07/17/2021    [provider]  nitrofurantoin, macrocrystal-monohydrate, (MACROBID) 100 MG capsule Take 1 capsule (100 mg total) by mouth 2 (two) times daily. Patient not taking: Reported on 07/17/2021 05/10/21   Derek Jack, MD  OXYGEN Inhale 2 L into the lungs continuous. At night time only    [provider]  phenylephrine-shark liver oil-mineral oil-petrolatum (PREPARATION H) 0.25-14-74.9 % rectal ointment Place 1 application rectally as needed for hemorrhoids. Patient not taking: Reported on 07/17/2021    [provider]  prochlorperazine (COMPAZINE) 10 MG tablet Take 1 tablet (10 mg total) by mouth every 6 (six) hours as needed (Nausea or vomiting). Patient not taking: Reported on 07/17/2021 03/25/21   Derek Jack, MD  sucralfate (CARAFATE) 1 GM/10ML suspension SWISH AND SWALLOW 15ML FOUR TIMES DAILY Patient not taking: Reported on 07/17/2021 10/12/20   [provider]  trifluridine-tipiracil (LONSURF) 20-8.19 MG tablet Take 3 pills twice a day 1 hr after AM & PM meals on days 1-5,  8-12. Repeat every 28 days Patient not taking: Reported on 07/17/2021 06/13/21   Derek Jack, MD    Inpatient Medications: Scheduled Meds:  aspirin EC  81 mg Oral Daily   atorvastatin  10 mg Oral [id number]   folic acid  1 mg Oral Daily   furosemide  20 mg Oral BID   gabapentin  600 mg Oral BID   insulin aspart  0-9 Units Subcutaneous Q4H   irbesartan  75 mg Oral Daily   multivitamin with minerals  1 tablet Oral Daily   tamsulosin  0.4 mg Oral Daily   thiamine  100 mg Oral Daily   Or   thiamine  100 mg Intravenous Daily   Zinc Oxide   Topical BID   Continuous Infusions:  cefTRIAXone (ROCEPHIN)  IV     PRN Meds: acetaminophen **OR** acetaminophen, albuterol, HYDROcodone-acetaminophen, HYDROmorphone (DILAUDID) injection, LORazepam **OR** LORazepam, methocarbamol, ondansetron **OR** ondansetron (ZOFRAN) IV  Allergies:   No Known Allergies  Social History:   Social History   Socioeconomic History   Marital status: Single    Spouse name: Not on file   Number of children: Not on file   Years of education: Not on file   Highest education level: Not on file  Occupational History   Not on file  Tobacco Use   Smoking status: Former    Packs/day: 1.00    Years: 55.00    Total pack years: 55.00    Types: Cigarettes    Start date: 02/07/1955   Smokeless tobacco: Never   Tobacco comments:    quit 2015  Vaping Use   Vaping Use: Never used  Substance and Sexual Activity   Alcohol use: Not Currently    Alcohol/week: 3.0 standard drinks of alcohol    Types: 3 Cans of beer per week   Drug use: No   Sexual activity: Yes    Birth control/protection: None  Other Topics Concern   Not on file  Social History Narrative   Not on file   Social Determinants of Health   Financial Resource Strain: Not on file  Food Insecurity: Not on file  Transportation Needs: Not on file  Physical Activity: Not on file  Stress: Not on file  Social Connections: Not on file  Intimate Partner  Violence: Not on file    Family History:    Family History  Problem Relation Age of Onset   Heart disease  Father    Heart attack Father    Alzheimer's disease Mother      ROS:  Constitutional: see HPI  Eyes: Denied vision change or loss Ears/Nose/Mouth/Throat: Denied ear ache, sore throat, coughing, sinus pain Cardiovascular: denied chest pain/pressure Respiratory: SOB Gastrointestinal: colon cancer Genital/Urinary: Denied dysuria, hematuria, urinary frequency/urgency Musculoskeletal: see HPI  Skin: chronic venous stasis of BLE Neuro: Denied headache, dizziness, syncope Psych: Denied history of depression/anxiety  Endocrine: history of diabetes   Physical Exam/Data:   Vitals:   07/18/21 0925 07/18/21 1108 07/18/21 1200 07/18/21 1316  BP: (!) 143/101 (!) 117/56 97/76 (!) 130/40  Pulse: 100 100 (!) 102 (!) 106  Resp: (!) 23 17 15    Temp:    98.7 F (37.1 C)  TempSrc:    Oral  SpO2: 96% 97% 95% 91%  Weight:      Height:       No intake or output data in the 24 hours ending 07/18/21 1514    07/17/2021    2:53 PM 06/06/2021   10:13 AM 05/10/2021   12:59 PM  Last 3 Weights  Weight (lbs) 240 lb 244 lb 8 oz 251 lb  Weight (kg) 108.863 kg 110.904 kg 113.853 kg     Body mass index is 45.35 kg/m.   Vitals:  Vitals:   07/18/21 1200 07/18/21 1316  BP: 97/76 (!) 130/40  Pulse: (!) 102 (!) 106  Resp: 15   Temp:  98.7 F (37.1 C)  SpO2: 95% 91%   General Appearance: Frail elderly, older than his age, fatigued, pallor  HEENT: Normocephalic, atraumatic. EOMs intact.  Neck: Supple, trachea midline, neck and short and thick  Cardiovascular: Regular rate and rhythm, normal S1-S2,  no murmur Respiratory: Resting breathing unlabored, mild DOE, lungs sounds clear but diminished to auscultation bilaterally, no use of accessory muscles.On [place] oxygen.  Gastrointestinal: Bowel sounds positive, abdomen soft Extremities: BLE with venous stasis, 2+ pitting edema, left hip tenderness   Musculoskeletal: Normal muscle bulk and tone Skin: Intact, warm, dry. BLE erythema with warmth, R>L, mild tenderness  Neurologic: Alert, oriented to person, place and time. Limited insight, able to answer simple questions appropriately but difficult to elaborate   Psychiatric: Flat affect      EKG:  The EKG was personally reviewed and demonstrates: Sinus rhythm  Telemetry:  Telemetry was personally reviewed and demonstrates:  N/A  Relevant CV Studies:  Echo from 09/11/2012:  - Left ventricle: Technically limited study. The cavity size    was normal. Wall thickness was increased in a pattern of    mild LVH. The estimated ejection fraction was 60%.    Regional wall motion abnormalities cannot be excluded.  - Left atrium: The atrium was mildly dilated.  - Right ventricle: The cavity size was normal. Systolic    function was low normal.  Laboratory Data:  High Sensitivity Troponin:  No results for input(s): "TROPONINIHS" in the last 720 hours.   Chemistry Recent Labs  Lab 07/17/21 1515 07/18/21 0518  NA 140 139  K 4.5 5.3*  CL 111 111  CO2 24 26  GLUCOSE 112* 124*  BUN 24* 26*  CREATININE 1.43* 1.45*  CALCIUM 8.0* 7.9*  MG 2.0  --   GFRNONAA 50* 49*  ANIONGAP 5 2*    No results for input(s): "PROT", "ALBUMIN", "AST", "ALT", "ALKPHOS", "BILITOT" in the last 168 hours. Lipids No results for input(s): "CHOL", "TRIG", "HDL", "LABVLDL", "LDLCALC", "CHOLHDL" in the last 168 hours.  Hematology Recent Labs  Lab  07/17/21 1515 07/18/21 0518  WBC 8.3 11.0*  RBC 2.27* 2.22*  HGB 7.9* 7.8*  HCT 24.3* 24.5*  MCV 107.0* 110.4*  MCH 34.8* 35.1*  MCHC 32.5 31.8  RDW 16.3* 16.3*  PLT 279 268   Thyroid  Recent Labs  Lab 07/17/21 1515  TSH 2.775    BNPNo results for input(s): "BNP", "PROBNP" in the last 168 hours.  DDimer No results for input(s): "DDIMER" in the last 168 hours.   Radiology/Studies:  CT HIP LEFT WO CONTRAST  Result Date: 07/17/2021 CLINICAL DATA:   Status post fall. EXAM: CT OF THE LEFT HIP WITHOUT CONTRAST TECHNIQUE: Multidetector CT imaging of the left hip was performed according to the standard protocol. Multiplanar CT image reconstructions were also generated. RADIATION DOSE REDUCTION: This exam was performed according to the departmental dose-optimization program which includes automated exposure control, adjustment of the mA and/or kV according to patient size and/or use of iterative reconstruction technique. COMPARISON:  None Available. FINDINGS: Bones/Joint/Cartilage Acute, comminuted fracture deformity is seen extending through the inter trochanteric region of the proximal left femur. Involvement of the adjacent portion of the proximal left femoral shaft is seen. There is no evidence of dislocation. Ligaments Suboptimally assessed by CT. Muscles and Tendons Limited in evaluation and otherwise unremarkable. Soft tissues A 4.6 cm x 2.4 cm x 8.1 cm chronic appearing collection of para muscular fluid (approximately 19.97 Hounsfield units) is seen within the soft tissues of the lateral left pelvic wall. This extends to the level of the left hip. Soft tissue swelling is seen adjacent to the previously noted fracture site with chronic appearing soft tissue calcifications noted along lateral aspect of the left hip. IMPRESSION: 1. Acute, comminuted fracture of the proximal left femur. 2. 4.6 cm x 2.4 cm x 8.1 cm chronic appearing collection of para muscular fluid within the soft tissues of the lateral left pelvic wall, as described above. Electronically Signed   By: Virgina Norfolk M.D.   On: 07/17/2021 18:12   DG Pelvis 1-2 Views  Result Date: 07/17/2021 CLINICAL DATA:  Fall.  Left hip pain. EXAM: PELVIS - 1-2 VIEW COMPARISON:  None Available. FINDINGS: Intertrochanteric fracture of the proximal left femur, without significant displacement. No angulation. No other fractures. Skeletal structures are diffusely demineralized. Irregularity and flattening  along the lateral right ilium consistent with a bone graft harvesting site. Hip joints, SI joints and symphysis pubis are normally aligned. 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 IMPRESSION: 1. Intertrochanteric fracture of the proximal left femur without significant displacement or angulation. Electronically Signed   By: Lajean Manes M.D.   On: 07/17/2021 16:39   DG Femur Min 2 Views Left  Result Date: 07/17/2021 CLINICAL DATA:  Fall.  Left hip pain. EXAM: LEFT FEMUR 2 VIEWS COMPARISON:  None Available. FINDINGS: On a single view, there is a nondisplaced, non comminuted intertrochanteric fracture of the proximal femur. No other fractures.  No bone lesions. Hip and knee joints are normally aligned. Multiple rounded to oval calcifications project lateral to the left hip and hemipelvis, suspected to be injection granuloma. IMPRESSION: 1. Nondisplaced, non comminuted intertrochanteric fracture of the proximal left femur. Electronically Signed   By: Lajean Manes M.D.   On: 07/17/2021 16:37   DG Chest 1 View  Result Date: 07/17/2021 CLINICAL DATA:  Left hip pain. EXAM: CHEST  1 VIEW COMPARISON:  09/16/2015. FINDINGS: Cardiac silhouette normal in size.  No mediastinal or hilar masses. Hazy opacity  at the right lung base. Remainder of the lungs is clear. Suspected small right effusion. No convincing left pleural effusion. No pneumothorax. Right anterior chest wall, internal jugular, Port-A-Cath is stable. Stable changes from a previous anterior and posterior cervical spine fusion and laminectomies. IMPRESSION: 1. Mild opacity at the right lung base suspected to be a small effusion with associated atelectasis. Consider pneumonia if there are consistent clinical findings. 2. No other evidence of acute cardiopulmonary disease. No pulmonary edema. Electronically Signed   By: Lajean Manes M.D.   On: 07/17/2021 16:35     Assessment and  Plan:   Preop risk evaluation -RCPI 1, 0.9% perioperative risk of major cardiac event -DASI 2.75, 3.08 functional METS -2014 Lexiscan negative and Echo with preserved LVEF -Risk is moderate to high for upcoming left hip surgery, given his age and multiple comorbidity (morbid obesity, anemia, CKD, diabetes, colon cancer etc), will assess for CHF with Echo, would transfuse to keep Hgb >7 and treat for electrolytes abnormalities before OR, no absolute contraindication for OR at this time   Dyspnea Bilateral leg edema with venous stasis  - clinically with chronic SOB and leg edema with venous stasis, not active at baseline, no known diagnosis of CHF  - CXR no acute CHF at admission - Echo 2014 with LVEF 60%  - will check BNP and repeat Echo to re-assess for systolic and diastolic function, query symptomatology from CKD and hypoalbuminemia + OHS + deconditioning  - on Lasix 20mg  BID at home, will continue for now   Acute comminuted fracture of proximal left femur Chronic hypoxic respiratory failure  CKD IIIb Chronic venous stasis of BLE with superimposed cellulitis R>L Hypoalbuminemia  Stage IV colon cancer with metastasis to lung Hypertension Hyperlipidemia Tremor  Insulin-dependent type 2 diabetes OSA Morbid obesity  -Per internal medicine/ortho    Risk Assessment/Risk Scores:   New York Heart Association (NYHA) Functional Class NYHA Class II   For questions or updates, please contact [facility] Please consult [email or web address] for contact info under    Signed, Margie Billet, NP  07/18/2021 3:14 PM   Agree with note by Margie Billet NP  We are asked to see Mr. Gary Nelson for preoperative clearance before left ORIF for hip fracture.  He is a 81 year old moderately overweight single Caucasian male who lives alone.  He is minimally ambulatory and lives a bed to chair existence.  He does have a history of treated hypertension, type 2 diabetes, obstructive sleep apnea and chronic lower  extremity edema with venous stasis changes and CK D3.  He did have a 2D echo back in 2014 as well as a Myoview stress test both of which were normal.  He said: Cancer and surgery which he did fine during.  He denies chest pain.  His exam is benign.  EKG shows no acute changes.  I believe he is low risk from a heart point of view to have this hip surgically addressed.  I am going get a 2D echo to evaluate LV function today prior to that.  Lorretta Harp, M.D., [place], [facility], [place] [address] [place], [place]  [id number]  [phone] 07/18/2021 4:55 PM

## 2021-07-18 NOTE — Assessment & Plan Note (Signed)
-  Body mass index is 45.35 kg/m. -low calorie diet and portion control discussed with patient.

## 2021-07-18 NOTE — Anesthesia Preprocedure Evaluation (Signed)
Anesthesia Evaluation  Patient identified by MRN, date of birth, ID band Patient awake    Reviewed: Allergy & Precautions, NPO status , Patient's Chart, lab work & pertinent test results  Airway Mallampati: II  TM Distance: >3 FB Neck ROM: Full    Dental  (+) Poor Dentition   Pulmonary sleep apnea , former smoker,    Pulmonary exam normal        Cardiovascular hypertension, Pt. on medications +CHF and + DVT   Rhythm:Regular Rate:Normal     Neuro/Psych negative neurological ROS  negative psych ROS   GI/Hepatic negative GI ROS, (+) Cirrhosis       ,   Endo/Other  diabetes, Type 2Morbid obesity  Renal/GU negative Renal ROS  negative genitourinary   Musculoskeletal  (+) Arthritis , Osteoarthritis,  Femur fx   Abdominal Normal abdominal exam  (+)   Peds  Hematology  (+) Blood dyscrasia, anemia ,   Anesthesia Other Findings   Reproductive/Obstetrics                            Anesthesia Physical Anesthesia Plan  ASA: 3  Anesthesia Plan: General   Post-op Pain Management:    Induction: Intravenous  PONV Risk Score and Plan: 2 and Ondansetron, Dexamethasone and Treatment may vary due to age or medical condition  Airway Management Planned: Mask and Oral ETT  Additional Equipment: None  Intra-op Plan:   Post-operative Plan: Extubation in OR  Informed Consent: I have reviewed the patients History and Physical, chart, labs and discussed the procedure including the risks, benefits and alternatives for the proposed anesthesia with the patient or authorized representative who has indicated his/her understanding and acceptance.     Dental advisory given  Plan Discussed with: CRNA  Anesthesia Plan Comments: (Lab Results      Component                Value               Date                      WBC                      11.0 (H)            07/18/2021                HGB                       7.8 (L)             07/18/2021                HCT                      24.5 (L)            07/18/2021                MCV                      110.4 (H)           07/18/2021                PLT  268                 07/18/2021           Lab Results      Component                Value               Date                      NA                       139                 07/18/2021                K                        5.3 (H)             07/18/2021                CO2                      26                  07/18/2021                GLUCOSE                  124 (H)             07/18/2021                BUN                      26 (H)              07/18/2021                CREATININE               1.45 (H)            07/18/2021                CALCIUM                  7.9 (L)             07/18/2021                GFRNONAA                 49 (L)              07/18/2021          )       Anesthesia Quick Evaluation

## 2021-07-18 NOTE — Assessment & Plan Note (Signed)
-   No complaints of urinary retention reported -Continue Flomax.

## 2021-07-18 NOTE — Assessment & Plan Note (Signed)
-  chronic and overall stable -continue treatment with lasix.

## 2021-07-18 NOTE — Assessment & Plan Note (Signed)
-  Continue Lipitor °

## 2021-07-18 NOTE — Assessment & Plan Note (Signed)
-   In the setting of diabetic nephropathy -Appears to be stable and at baseline -Continue to follow renal function trend.

## 2021-07-18 NOTE — Assessment & Plan Note (Signed)
-   In the setting of COPD -Patient appreciated -Good saturation on chronic 2 L supplementation -Continue as needed bronchodilators.

## 2021-07-18 NOTE — Consult Note (Signed)
Reason for Consult:Left hip fx Referring Physician: [name] Time called: 1320 Time at bedside: Gary Nelson is an 81 y.o. male.  HPI: Gary Nelson suffered a mechanical fall at home yesterday. He had immediate left hip pain and could not get up. He was brought to the ED at [facility] where x-rays showed a left hip fx and orthopedic surgery was consulted. He was transferred to [facility] for definitive care. He lives at home alone and uses a RW to ambulate.  Past Medical History:  Diagnosis Date   Adenocarcinoma of colon ([place])    RLL -  Pulmonary nodule (06/2015, concerning for mets vs primary; to see RAD-ONC Dr. 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   Neuropathy    OSA on CPAP    Portal hypertension ([place]) 07/28/2019   Prostatitis    Pulmonary nodule    Renal disorder    Sleep apnea     Past Surgical History:  Procedure Laterality Date   BACK SURGERY     CATARACT EXTRACTION     left   CATARACT EXTRACTION W/PHACO Right 04/11/2012   Procedure: CATARACT EXTRACTION PHACO AND INTRAOCULAR LENS PLACEMENT ([place]);  Surgeon: Tonny Branch, MD;  Location: [facility] ORS;  Service: Ophthalmology;  Laterality: Right;  CDE:62.48   CERVICAL FUSION     c4-c5   CHOLECYSTECTOMY     COLON SURGERY  09/29/12   Colon resection for cancer   COLONOSCOPY     EYE SURGERY     cataract   GIVENS CAPSULE STUDY N/A 03/15/2015   Procedure: GIVENS CAPSULE STUDY;  Surgeon: Rogene Houston, MD;  Location: [facility] ENDO SUITE;  Service: Endoscopy;  Laterality: N/A;  730   LEG SURGERY     ORIF FEMUR FRACTURE     right   POSTERIOR CERVICAL FUSION/FORAMINOTOMY N/A 07/27/2015    Procedure: Cervical four to Cervical seven Laminectomy with Cervical four to Thoracic one Dorsal internal fixation and fusion;  Surgeon: Kevan Ny Ditty, MD;  Location: [facility] ORS;  Service: Neurosurgery;  Laterality: N/A;  C4 to C7 Laminectomy with C4 to T1 Dorsal internal fixation and fusion    Family History  Problem Relation Age of Onset   Heart disease Father    Heart attack Father    Alzheimer's disease Mother     Social History:  reports that he has quit smoking. His smoking use included cigarettes. He started smoking about 66 years ago. He has a 55.00 pack-year smoking history. He has never used smokeless tobacco. He reports that he does not currently use alcohol after a past usage of about 3.0 standard drinks of alcohol per week. He reports that he does not use drugs.  Allergies: No Known Allergies  Medications: I have reviewed the patient's current medications.  Results for orders placed or performed during the hospital encounter of 07/17/21 (from the past 48 hour(s))  CBC with Differential     Status: Abnormal   Collection Time: 07/17/21  3:15 PM  Result Value Ref Range  WBC 8.3 4.0 - 10.5 K/uL   RBC 2.27 (L) 4.22 - 5.81 MIL/uL   Hemoglobin 7.9 (L) 13.0 - 17.0 g/dL   HCT 24.3 (L) 39.0 - 52.0 %   MCV 107.0 (H) 80.0 - 100.0 fL   MCH 34.8 (H) 26.0 - 34.0 pg   MCHC 32.5 30.0 - 36.0 g/dL   RDW 16.3 (H) 11.5 - 15.5 %   Platelets 279 150 - 400 K/uL   nRBC 0.0 0.0 - 0.2 %   Neutrophils Relative % 71 %   Neutro Abs 6.0 1.7 - 7.7 K/uL   Lymphocytes Relative 12 %   Lymphs Abs 1.0 0.7 - 4.0 K/uL   Monocytes Relative 11 %   Monocytes Absolute 0.9 0.1 - 1.0 K/uL   Eosinophils Relative 2 %   Eosinophils Absolute 0.1 0.0 - 0.5 K/uL   Basophils Relative 1 %   Basophils Absolute 0.1 0.0 - 0.1 K/uL   Immature Granulocytes 3 %   Abs Immature Granulocytes 0.24 (H) 0.00 - 0.07 K/uL    Comment: Performed at [facility], [address] [place], [place] [address]  Basic  metabolic panel     Status: Abnormal   Collection Time: 07/17/21  3:15 PM  Result Value Ref Range   Sodium 140 135 - 145 mmol/L   Potassium 4.5 3.5 - 5.1 mmol/L   Chloride 111 98 - 111 mmol/L   CO2 24 22 - 32 mmol/L   Glucose, Bld 112 (H) 70 - 99 mg/dL    Comment: Glucose reference range applies only to samples taken after fasting for at least 8 hours.   BUN 24 (H) 8 - 23 mg/dL   Creatinine, Ser 1.43 (H) 0.61 - 1.24 mg/dL   Calcium 8.0 (L) 8.9 - 10.3 mg/dL   GFR, Estimated 50 (L) >60 mL/min    Comment: (NOTE) Calculated using the CKD-EPI Creatinine Equation (2021)    Anion gap 5 5 - 15    Comment: Performed at [facility], [address] [place], [place] [address]  TSH     Status: None   Collection Time: 07/17/21  3:15 PM  Result Value Ref Range   TSH 2.775 0.350 - 4.500 uIU/mL    Comment: Performed by a 3rd Generation assay with a functional sensitivity of <=0.01 uIU/mL. Performed at [facility], [address], [place], [place] [address]   Magnesium     Status: None   Collection Time: 07/17/21  3:15 PM  Result Value Ref Range   Magnesium 2.0 1.7 - 2.4 mg/dL    Comment: Performed at [facility], [address], [place], [place] [address]  CBG monitoring, ED     Status: Abnormal   Collection Time: 07/17/21  8:47 PM  Result Value Ref Range   Glucose-Capillary 162 (H) 70 - 99 mg/dL    Comment: Glucose reference range applies only to samples taken after fasting for at least 8 hours.  CBG monitoring, ED     Status: Abnormal   Collection Time: 07/17/21 10:03 PM  Result Value Ref Range   Glucose-Capillary 161 (H) 70 - 99 mg/dL    Comment: Glucose reference range applies only to samples taken after fasting for at least 8 hours.  Hemoglobin A1c     Status: None   Collection Time: 07/18/21  5:18 AM  Result Value Ref Range   Hgb A1c MFr Bld 5.4 4.8 - 5.6 %    Comment: (NOTE) Pre diabetes:  5.7%-6.4%  Diabetes:              >6.4%  Glycemic control for   <7.0% adults  with diabetes    Mean Plasma Glucose 108.28 mg/dL    Comment: Performed at [facility] [address], [place], [place] [address]  Basic metabolic panel     Status: Abnormal   Collection Time: 07/18/21  5:18 AM  Result Value Ref Range   Sodium 139 135 - 145 mmol/L    Comment: Electrolytes repeated to confirm.   Potassium 5.3 (H) 3.5 - 5.1 mmol/L   Chloride 111 98 - 111 mmol/L   CO2 26 22 - 32 mmol/L   Glucose, Bld 124 (H) 70 - 99 mg/dL    Comment: Glucose reference range applies only to samples taken after fasting for at least 8 hours.   BUN 26 (H) 8 - 23 mg/dL   Creatinine, Ser 1.45 (H) 0.61 - 1.24 mg/dL   Calcium 7.9 (L) 8.9 - 10.3 mg/dL   GFR, Estimated 49 (L) >60 mL/min    Comment: (NOTE) Calculated using the CKD-EPI Creatinine Equation (2021)    Anion gap 2 (L) 5 - 15    Comment: Performed at [facility], [address], [place], [place] [address]  CBC     Status: Abnormal   Collection Time: 07/18/21  5:18 AM  Result Value Ref Range   WBC 11.0 (H) 4.0 - 10.5 K/uL   RBC 2.22 (L) 4.22 - 5.81 MIL/uL   Hemoglobin 7.8 (L) 13.0 - 17.0 g/dL   HCT 24.5 (L) 39.0 - 52.0 %   MCV 110.4 (H) 80.0 - 100.0 fL   MCH 35.1 (H) 26.0 - 34.0 pg   MCHC 31.8 30.0 - 36.0 g/dL   RDW 16.3 (H) 11.5 - 15.5 %   Platelets 268 150 - 400 K/uL   nRBC 0.0 0.0 - 0.2 %    Comment: Performed at [facility], [address] [place], [place] [address]  CBG monitoring, ED     Status: Abnormal   Collection Time: 07/18/21  5:22 AM  Result Value Ref Range   Glucose-Capillary 115 (H) 70 - 99 mg/dL    Comment: Glucose reference range applies only to samples taken after fasting for at least 8 hours.  CBG monitoring, ED     Status: Abnormal   Collection Time: 07/18/21  7:36 AM  Result Value Ref Range   Glucose-Capillary 101 (H) 70 - 99 mg/dL    Comment: Glucose reference range applies only to samples taken after fasting for at least 8 hours.  CBG monitoring, ED     Status: None   Collection Time:  07/18/21 11:23 AM  Result Value Ref Range   Glucose-Capillary 94 70 - 99 mg/dL    Comment: Glucose reference range applies only to samples taken after fasting for at least 8 hours.    CT HIP LEFT WO CONTRAST  Result Date: 07/17/2021 CLINICAL DATA:  Status post fall. EXAM: CT OF THE LEFT HIP WITHOUT CONTRAST TECHNIQUE: Multidetector CT imaging of the left hip was performed according to the standard protocol. Multiplanar CT image reconstructions were also generated. RADIATION DOSE REDUCTION: This exam was performed according to the departmental dose-optimization program which includes automated exposure control, adjustment of the mA and/or kV according to patient size and/or use of iterative reconstruction technique. COMPARISON:  None Available. FINDINGS: Bones/Joint/Cartilage Acute, comminuted fracture deformity is seen extending through the inter trochanteric region of the proximal left femur. Involvement  of the adjacent portion of the proximal left femoral shaft is seen. There is no evidence of dislocation. Ligaments Suboptimally assessed by CT. Muscles and Tendons Limited in evaluation and otherwise unremarkable. Soft tissues A 4.6 cm x 2.4 cm x 8.1 cm chronic appearing collection of para muscular fluid (approximately 19.97 Hounsfield units) is seen within the soft tissues of the lateral left pelvic wall. This extends to the level of the left hip. Soft tissue swelling is seen adjacent to the previously noted fracture site with chronic appearing soft tissue calcifications noted along lateral aspect of the left hip. IMPRESSION: 1. Acute, comminuted fracture of the proximal left femur. 2. 4.6 cm x 2.4 cm x 8.1 cm chronic appearing collection of para muscular fluid within the soft tissues of the lateral left pelvic wall, as described above. Electronically Signed   By: Virgina Norfolk M.D.   On: 07/17/2021 18:12   DG Pelvis 1-2 Views  Result Date: 07/17/2021 CLINICAL DATA:  Fall.  Left hip pain. EXAM:  PELVIS - 1-2 VIEW COMPARISON:  None Available. FINDINGS: Intertrochanteric fracture of the proximal left femur, without significant displacement. No angulation. No other fractures. Skeletal structures are diffusely demineralized. Irregularity and flattening along the lateral right ilium consistent with a bone graft harvesting site. Hip joints, SI joints and symphysis pubis are normally aligned. 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 IMPRESSION: 1. Intertrochanteric fracture of the proximal left femur without significant displacement or angulation. Electronically Signed   By: Lajean Manes M.D.   On: 07/17/2021 16:39   DG Femur Min 2 Views Left  Result Date: 07/17/2021 CLINICAL DATA:  Fall.  Left hip pain. EXAM: LEFT FEMUR 2 VIEWS COMPARISON:  None Available. FINDINGS: On a single view, there is a nondisplaced, non comminuted intertrochanteric fracture of the proximal femur. No other fractures.  No bone lesions. Hip and knee joints are normally aligned. Multiple rounded to oval calcifications project lateral to the left hip and hemipelvis, suspected to be injection granuloma. IMPRESSION: 1. Nondisplaced, non comminuted intertrochanteric fracture of the proximal left femur. Electronically Signed   By: Lajean Manes M.D.   On: 07/17/2021 16:37   DG Chest 1 View  Result Date: 07/17/2021 CLINICAL DATA:  Left hip pain. EXAM: CHEST  1 VIEW COMPARISON:  09/16/2015. FINDINGS: Cardiac silhouette normal in size.  No mediastinal or hilar masses. Hazy opacity at the right lung base. Remainder of the lungs is clear. Suspected small right effusion. No convincing left pleural effusion. No pneumothorax. Right anterior chest wall, internal jugular, Port-A-Cath is stable. Stable changes from a previous anterior and posterior cervical spine fusion and laminectomies. IMPRESSION: 1. Mild opacity at the right lung base suspected to be a small effusion  with associated atelectasis. Consider pneumonia if there are consistent clinical findings. 2. No other evidence of acute cardiopulmonary disease. No pulmonary edema. Electronically Signed   By: Lajean Manes M.D.   On: 07/17/2021 16:35    Review of Systems  HENT:  Negative for ear discharge, ear pain, hearing loss and tinnitus.   Eyes:  Negative for photophobia and pain.  Respiratory:  Negative for cough and shortness of breath.   Cardiovascular:  Negative for chest pain.  Gastrointestinal:  Negative for abdominal pain, nausea and vomiting.  Genitourinary:  Negative for dysuria, flank pain, frequency and urgency.  Musculoskeletal:  Positive for arthralgias (Left hip). Negative for back pain, myalgias and neck pain.  Neurological:  Negative for dizziness and headaches.  Hematological:  Does not bruise/bleed easily.  Psychiatric/Behavioral:  The patient is not nervous/anxious.    Blood pressure (!) 130/40, pulse (!) 106, temperature 98.7 F (37.1 C), temperature source Oral, resp. rate 15, height 5\' 1"  (1.549 m), weight 108.9 kg, SpO2 91 %. Physical Exam Constitutional:      General: He is not in acute distress.    Appearance: He is well-developed. He is not diaphoretic.  HENT:     Head: Normocephalic and atraumatic.  Eyes:     General: No scleral icterus.       Right eye: No discharge.        Left eye: No discharge.     Conjunctiva/sclera: Conjunctivae normal.  Cardiovascular:     Rate and Rhythm: Normal rate and regular rhythm.  Pulmonary:     Effort: Pulmonary effort is normal. No respiratory distress.  Musculoskeletal:     Cervical back: Normal range of motion.     Comments: LLE No traumatic wounds, ecchymosis, or rash but healing ulceration lower lateral leg  Mild TTP hip  No knee or ankle effusion  Knee stable to varus/ valgus and anterior/posterior stress  Sens DPN, SPN, TN intact  Motor EHL, ext, flex, evers 5/5  DP 1+, PT 0, No significant edema  Skin:    General: Skin  is warm and dry.  Neurological:     Mental Status: He is alert.  Psychiatric:        Mood and Affect: Mood normal.        Behavior: Behavior normal.     Assessment/Plan: Left hip fx -- Plan IMN tomorrow with Dr. Mable Fill. Please keep NPO after MN.    Lisette Abu, PA-C Orthopedic Surgery [phone] 07/18/2021, 1:43 PM

## 2021-07-18 NOTE — Progress Notes (Signed)
Progress Note   Patient: Gary Nelson PQZ:[id number] DOB: 02-05-1941 DOA: 07/17/2021     1 DOS: the patient was seen and examined on 07/18/2021   Brief hospital course: As per H&P written by Dr. Denton Nelson on 07/17/2021 Gary Nelson is a 81 y.o. male with medical history significant for diabetes mellitus, CHF, liver cirrhosis, hypertension, OSA and obesity, colon cancer. Patient was brought to the ED by EMS reports of a fall.  Patient was using his walker when he fell. He fell on his left side and had a crack.  He denies hitting his head, he did not lose consciousness.  Reports chronic bilateral lower extremity swelling, with fluctuant spots not significantly changed from baseline. Reports chronic unchanged cough, and chronic unchanged difficulty breathing.  No chest pain.    ED Course: Heart rate 97-107.  Temperature 98.  Heart rate 12- 20.  Blood pressure 180 138.  O2 sats greater than 90% on room air. Pelvic x-ray shows intertrochanteric proximal left femoral fracture without significant displacement or angulation. Subsequent CT of the left hip-acute comminuted fracture of the proximal left femur. EDP talked to orthopedist, initial plan was to admit here at [facility], but subsequently after over conversations with anesthesiology at [facility], plan is to admit to [facility] for Ortho evaluation.  Assessment and Plan: * Closed comminuted intertrochanteric fracture of left femur (HCC) -Left femur fracture status post mechanical fall. -Orthopedic service recommending surgical repair to stabilize fracture. -Unfortunately patient found high risk for intervention due to chronic cormobidites at [facility] and transferred to [facility] for further evaluation and management. -Dr Gary Nelson- will see in consult at [facility] as needed analgesics. -Patient is at high risk for surgical intervention; and due to appreciated 2 episodes of cyanosis pulses of approximately 1 second each cardiology  clearance was recommended prior to surgical intervention. -Case was discussed with Dr. Marcelle Nelson for clearance, he will be seen by cardiology team at [facility].  -I doubt any intervention prior to surgery will change his risk.  CHF, chronic ([place]) -Has 1+ pitting bilateral lower extremity edema, which patient reports fluctuates but is chronic.  -Positive bilateral venous stasis dermatitis appreciated. -Continue daily weights, strict I's and O's and weight diet resume low-sodium diet.   -Continue current dose of Lasix.   -Last echo 2014 EF of 60%. -Cardiology service requested for clearance and will follow any further recommendations. -Condition appears to be compensated.   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  -Continue outpatient follow-up with oncology service (Dr. Delton Nelson).  -Last CT 06/20/2021, imaging showed interval increase in size of right lower lobe mass by few millimeters.  Chemotherapy has been held for now. -overall guarded prognosis and will recommend GOC/palliative care discussion for advance care planning   Diabetes mellitus ([place]) -Update A1c -Continue sliding scale insulin -Follow CBGs fluctuation and adjust hypoglycemic regimen as needed. 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 -Will recommend holding on antibiotics at this point -Follow urine culture results.  Chronic respiratory failure with hypoxia (HCC) - In the setting of COPD -Patient appreciated -Good saturation on chronic 2 L supplementation -Continue as needed bronchodilators.  BPH (benign prostatic hyperplasia) - No complaints of urinary retention reported -Continue Flomax.  HLD (hyperlipidemia) -  Continue Lipitor.  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  Benign essential hypertension -Stable. 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 -patient reports using PRN lomotil  Sleep apnea -continue CPAP nightly  Morbid obesity ([place]) -Body mass index is 45.35 kg/m. -low calorie diet and portion control discussed with patient.   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   Subjective:  No chest pain, no nausea, no vomiting.  Reports pain in his left hip with any movement.  Currently afebrile.  Physical Exam: Vitals:   07/18/21 0610 07/18/21 0700 07/18/21 0925 07/18/21 1108  BP:  (!) 121/91 (!) 143/101 (!) 117/56  Pulse: 89 92 100 100  Resp: 14 13 (!) 23 17  Temp:      TempSrc:      SpO2: 96% 100% 96% 97%  Weight:      Height:       General exam: Alert, awake, oriented x 3; no chest pain, no nausea vomiting.  Currently afebrile.  Reporting pain in his left hip. Respiratory system: Good air movement bilaterally; no using accessory muscles. Cardiovascular system: Rate controlled, no rubs, no gallops, unable to properly assess JVD with body habitus. Gastrointestinal system: Abdomen is obese, nondistended, soft and nontender.  Easily reduced umbilical hernia appreciated on exam.  Normal bowel sounds heard. Central nervous system: Alert and oriented. No focal neurological deficits. Extremities: No cyanosis or clubbing; 1-2+ edema appreciated bilaterally with chronic venous stasis dermatitis changes. Skin: No  petechiae. Psychiatry: Judgement and insight appear normal. Mood & affect appropriate.   Data Reviewed: Basic metabolic panel with sodium of 139, potassium 5.3, chloride 111, BUN 26, creatinine 1.45 and GFR of 49. CBC hemoglobin 7.8, WBCs 11.0, platelet count 268 K Urinalysis with some turbid appearance, normal specific gravity, negative nitrite, 21-50 leukocytes esterase.  Patient reports no dysuria BNP 71.9   Family Communication: No family at bedside.  Disposition: Status is: Inpatient Remains inpatient appropriate because: Needs surgical repair of left hip fracture.  Planned Discharge Destination: To be determined.   Author: Barton Dubois, MD 07/18/2021 12:22 PM  For on call review [email or web address].

## 2021-07-19 ENCOUNTER — Other Ambulatory Visit: Payer: Self-pay

## 2021-07-19 ENCOUNTER — Inpatient Hospital Stay (HOSPITAL_COMMUNITY): Payer: Medicare Other

## 2021-07-19 ENCOUNTER — Encounter (HOSPITAL_COMMUNITY)
Admission: EM | Disposition: A | Discharge status: Skilled Nursing Facility | Payer: Self-pay | Source: Home / Self Care | Attending: Internal Medicine

## 2021-07-19 ENCOUNTER — Other Ambulatory Visit (HOSPITAL_COMMUNITY): Payer: Medicare Other

## 2021-07-19 ENCOUNTER — Inpatient Hospital Stay (HOSPITAL_COMMUNITY): Payer: Medicare Other | Admitting: Anesthesiology

## 2021-07-19 ENCOUNTER — Ambulatory Visit (HOSPITAL_COMMUNITY): Payer: Medicare Other

## 2021-07-19 ENCOUNTER — Ambulatory Visit (HOSPITAL_COMMUNITY): Payer: Medicare Other | Admitting: Hematology

## 2021-07-19 DIAGNOSIS — I5032 Chronic diastolic (congestive) heart failure: Secondary | ICD-10-CM | POA: Diagnosis not present

## 2021-07-19 DIAGNOSIS — E119 Type 2 diabetes mellitus without complications: Secondary | ICD-10-CM

## 2021-07-19 DIAGNOSIS — I509 Heart failure, unspecified: Secondary | ICD-10-CM

## 2021-07-19 DIAGNOSIS — K7460 Unspecified cirrhosis of liver: Secondary | ICD-10-CM

## 2021-07-19 DIAGNOSIS — N1832 Chronic kidney disease, stage 3b: Secondary | ICD-10-CM

## 2021-07-19 DIAGNOSIS — N400 Enlarged prostate without lower urinary tract symptoms: Secondary | ICD-10-CM

## 2021-07-19 DIAGNOSIS — E785 Hyperlipidemia, unspecified: Secondary | ICD-10-CM

## 2021-07-19 DIAGNOSIS — R8290 Unspecified abnormal findings in urine: Secondary | ICD-10-CM | POA: Diagnosis not present

## 2021-07-19 DIAGNOSIS — S72142A Displaced intertrochanteric fracture of left femur, initial encounter for closed fracture: Secondary | ICD-10-CM

## 2021-07-19 DIAGNOSIS — G4733 Obstructive sleep apnea (adult) (pediatric): Secondary | ICD-10-CM

## 2021-07-19 DIAGNOSIS — I110 Hypertensive heart disease with heart failure: Secondary | ICD-10-CM

## 2021-07-19 DIAGNOSIS — J9611 Chronic respiratory failure with hypoxia: Secondary | ICD-10-CM

## 2021-07-19 DIAGNOSIS — I10 Essential (primary) hypertension: Secondary | ICD-10-CM | POA: Diagnosis not present

## 2021-07-19 HISTORY — PX: APPLICATION OF WOUND VAC: SHX5189

## 2021-07-19 HISTORY — PX: INTRAMEDULLARY (IM) NAIL INTERTROCHANTERIC: SHX5875

## 2021-07-19 LAB — CBC
HCT: 22.5 % — ABNORMAL LOW (ref 39.0–52.0)
HCT: 22.1 % — ABNORMAL LOW (ref 39.0–52.0)
Hemoglobin: 7.2 g/dL — ABNORMAL LOW (ref 13.0–17.0)
Hemoglobin: 7.8 g/dL — ABNORMAL LOW (ref 13.0–17.0)
MCH: 34.6 pg — ABNORMAL HIGH (ref 26.0–34.0)
MCH: 36.1 pg — ABNORMAL HIGH (ref 26.0–34.0)
MCHC: 32 g/dL (ref 30.0–36.0)
MCHC: 35.3 g/dL (ref 30.0–36.0)
MCV: 108.2 fL — ABNORMAL HIGH (ref 80.0–100.0)
MCV: 102.3 fL — ABNORMAL HIGH (ref 80.0–100.0)
Platelets: 253 10*3/uL (ref 150–400)
Platelets: 198 10*3/uL (ref 150–400)
RBC: 2.08 MIL/uL — ABNORMAL LOW (ref 4.22–5.81)
RBC: 2.16 MIL/uL — ABNORMAL LOW (ref 4.22–5.81)
RDW: 16.3 % — ABNORMAL HIGH (ref 11.5–15.5)
RDW: 17.6 % — ABNORMAL HIGH (ref 11.5–15.5)
WBC: 12.4 10*3/uL — ABNORMAL HIGH (ref 4.0–10.5)
WBC: 14.8 10*3/uL — ABNORMAL HIGH (ref 4.0–10.5)
nRBC: 0 % (ref 0.0–0.2)
nRBC: 0 % (ref 0.0–0.2)

## 2021-07-19 LAB — BASIC METABOLIC PANEL
Anion gap: 3 — ABNORMAL LOW (ref 5–15)
BUN: 28 mg/dL — ABNORMAL HIGH (ref 8–23)
CO2: 24 mmol/L (ref 22–32)
Calcium: 7.5 mg/dL — ABNORMAL LOW (ref 8.9–10.3)
Chloride: 112 mmol/L — ABNORMAL HIGH (ref 98–111)
Creatinine, Ser: 1.77 mg/dL — ABNORMAL HIGH (ref 0.61–1.24)
GFR, Estimated: 39 mL/min — ABNORMAL LOW (ref >60)
Glucose, Bld: 100 mg/dL — ABNORMAL HIGH (ref 70–99)
Potassium: 5.1 mmol/L (ref 3.5–5.1)
Sodium: 139 mmol/L (ref 135–145)

## 2021-07-19 LAB — CREATININE, SERUM
Creatinine, Ser: 1.72 mg/dL — ABNORMAL HIGH (ref 0.61–1.24)
GFR, Estimated: 40 mL/min — ABNORMAL LOW (ref >60)

## 2021-07-19 LAB — GLUCOSE, CAPILLARY
Glucose-Capillary: 106 mg/dL — ABNORMAL HIGH (ref 70–99)
Glucose-Capillary: 120 mg/dL — ABNORMAL HIGH (ref 70–99)
Glucose-Capillary: 124 mg/dL — ABNORMAL HIGH (ref 70–99)
Glucose-Capillary: 161 mg/dL — ABNORMAL HIGH (ref 70–99)
Glucose-Capillary: 265 mg/dL — ABNORMAL HIGH (ref 70–99)
Glucose-Capillary: 76 mg/dL (ref 70–99)
Glucose-Capillary: 96 mg/dL (ref 70–99)

## 2021-07-19 LAB — PREPARE RBC (CROSSMATCH)

## 2021-07-19 LAB — MAGNESIUM: Magnesium: 2.1 mg/dL (ref 1.7–2.4)

## 2021-07-19 SURGERY — FIXATION, FRACTURE, INTERTROCHANTERIC, WITH INTRAMEDULLARY ROD
Anesthesia: General | Laterality: Left

## 2021-07-19 MED ORDER — HYDROMORPHONE HCL 1 MG/ML IJ SOLN
INTRAMUSCULAR | Status: AC
  Filled 2021-07-19: qty 0.5

## 2021-07-19 MED ORDER — MORPHINE SULFATE (PF) 2 MG/ML IV SOLN: 0.5000 mg | INTRAVENOUS | Status: DC | PRN

## 2021-07-19 MED ORDER — CEFAZOLIN SODIUM-DEXTROSE 2-3 GM-%(50ML) IV SOLR
INTRAVENOUS | Status: DC | PRN
  Administered 2021-07-19: 2 g via INTRAVENOUS

## 2021-07-19 MED ORDER — PHENOL 1.4 % MT LIQD: 1.0000 | OROMUCOSAL | Status: DC | PRN

## 2021-07-19 MED ORDER — EPHEDRINE 5 MG/ML INJ
INTRAVENOUS | Status: AC
Start: 2021-07-19 — End: ?
  Filled 2021-07-19: qty 5

## 2021-07-19 MED ORDER — ACETAMINOPHEN 10 MG/ML IV SOLN
INTRAVENOUS | Status: AC
Start: 2021-07-19 — End: ?
  Filled 2021-07-19: qty 100

## 2021-07-19 MED ORDER — LACTATED RINGERS IV SOLN: INTRAVENOUS | Status: DC

## 2021-07-19 MED ORDER — MENTHOL 3 MG MT LOZG: 1.0000 | LOZENGE | OROMUCOSAL | Status: DC | PRN

## 2021-07-19 MED ORDER — DOCUSATE SODIUM 100 MG PO CAPS
100.0000 mg | ORAL_CAPSULE | Freq: Two times a day (BID) | ORAL | Status: DC
  Administered 2021-07-19 – 2021-07-22 (×5): 100 mg via ORAL
  Filled 2021-07-19 (×6): qty 1

## 2021-07-19 MED ORDER — LIDOCAINE 2% (20 MG/ML) 5 ML SYRINGE
INTRAMUSCULAR | Status: AC
  Filled 2021-07-19: qty 10

## 2021-07-19 MED ORDER — VANCOMYCIN HCL 1000 MG IV SOLR
INTRAVENOUS | Status: DC | PRN
  Administered 2021-07-19: 1000 mg via TOPICAL

## 2021-07-19 MED ORDER — ROCURONIUM BROMIDE 10 MG/ML (PF) SYRINGE
PREFILLED_SYRINGE | INTRAVENOUS | Status: AC
Start: 2021-07-19 — End: ?
  Filled 2021-07-19: qty 20

## 2021-07-19 MED ORDER — ONDANSETRON HCL 4 MG PO TABS: 4.0000 mg | ORAL_TABLET | Freq: Four times a day (QID) | ORAL | Status: DC | PRN

## 2021-07-19 MED ORDER — DEXAMETHASONE SODIUM PHOSPHATE 10 MG/ML IJ SOLN
INTRAMUSCULAR | Status: AC
Start: 2021-07-19 — End: ?
  Filled 2021-07-19: qty 1

## 2021-07-19 MED ORDER — PROPOFOL 10 MG/ML IV BOLUS
INTRAVENOUS | Status: DC | PRN
  Administered 2021-07-19: 130 mg via INTRAVENOUS

## 2021-07-19 MED ORDER — PHENYLEPHRINE HCL-NACL 20-0.9 MG/250ML-% IV SOLN
INTRAVENOUS | Status: DC | PRN
  Administered 2021-07-19: 75 ug/min via INTRAVENOUS

## 2021-07-19 MED ORDER — ORAL CARE MOUTH RINSE
15.0000 mL | Freq: Once | OROMUCOSAL | Status: AC
Start: 2021-07-19 — End: 2021-07-19

## 2021-07-19 MED ORDER — HYDROCODONE-ACETAMINOPHEN 7.5-325 MG PO TABS
1.0000 | ORAL_TABLET | ORAL | Status: DC | PRN
  Administered 2021-07-21: 1 via ORAL
  Administered 2021-07-21: 2 via ORAL
  Administered 2021-07-21: 1 via ORAL
  Filled 2021-07-19 (×2): qty 1
  Filled 2021-07-19: qty 2

## 2021-07-19 MED ORDER — CALCIUM CHLORIDE 10 % IV SOLN
INTRAVENOUS | Status: AC
  Filled 2021-07-19: qty 20

## 2021-07-19 MED ORDER — TRANEXAMIC ACID-NACL 1000-0.7 MG/100ML-% IV SOLN
INTRAVENOUS | Status: AC
Start: 2021-07-19 — End: ?
  Filled 2021-07-19: qty 100

## 2021-07-19 MED ORDER — LIDOCAINE 2% (20 MG/ML) 5 ML SYRINGE
INTRAMUSCULAR | Status: DC | PRN
  Administered 2021-07-19: 80 mg via INTRAVENOUS

## 2021-07-19 MED ORDER — ALBUMIN HUMAN 5 % IV SOLN: INTRAVENOUS | Status: DC | PRN

## 2021-07-19 MED ORDER — SODIUM CHLORIDE 0.9 % IV SOLN: INTRAVENOUS | Status: DC | PRN

## 2021-07-19 MED ORDER — ONDANSETRON HCL 4 MG/2ML IJ SOLN: 4.0000 mg | Freq: Four times a day (QID) | INTRAMUSCULAR | Status: DC | PRN

## 2021-07-19 MED ORDER — CHLORHEXIDINE GLUCONATE 0.12 % MT SOLN: 15.0000 mL | Freq: Once | OROMUCOSAL | Status: AC

## 2021-07-19 MED ORDER — ACETAMINOPHEN 10 MG/ML IV SOLN
INTRAVENOUS | Status: DC | PRN
  Administered 2021-07-19: 1000 mg via INTRAVENOUS

## 2021-07-19 MED ORDER — ACETAMINOPHEN 325 MG PO TABS
325.0000 mg | ORAL_TABLET | Freq: Four times a day (QID) | ORAL | Status: DC | PRN
  Administered 2021-07-20: 650 mg via ORAL
  Filled 2021-07-19: qty 2

## 2021-07-19 MED ORDER — PHENYLEPHRINE 80 MCG/ML (10ML) SYRINGE FOR IV PUSH (FOR BLOOD PRESSURE SUPPORT)
PREFILLED_SYRINGE | INTRAVENOUS | Status: DC | PRN
  Administered 2021-07-19 (×2): 240 ug via INTRAVENOUS
  Administered 2021-07-19: 160 ug via INTRAVENOUS

## 2021-07-19 MED ORDER — CHLORHEXIDINE GLUCONATE 0.12 % MT SOLN
OROMUCOSAL | Status: AC
  Administered 2021-07-19: 15 mL via OROMUCOSAL
  Filled 2021-07-19: qty 15

## 2021-07-19 MED ORDER — HYDROMORPHONE HCL 1 MG/ML IJ SOLN
INTRAMUSCULAR | Status: DC | PRN
  Administered 2021-07-19 (×2): .5 mg via INTRAVENOUS

## 2021-07-19 MED ORDER — VANCOMYCIN HCL 1000 MG IV SOLR
INTRAVENOUS | Status: AC
  Filled 2021-07-19: qty 20

## 2021-07-19 MED ORDER — EPHEDRINE SULFATE-NACL 50-0.9 MG/10ML-% IV SOSY
PREFILLED_SYRINGE | INTRAVENOUS | Status: DC | PRN
  Administered 2021-07-19: 10 mg via INTRAVENOUS

## 2021-07-19 MED ORDER — ONDANSETRON HCL 4 MG/2ML IJ SOLN
INTRAMUSCULAR | Status: DC | PRN
  Administered 2021-07-19: 4 mg via INTRAVENOUS

## 2021-07-19 MED ORDER — 0.9 % SODIUM CHLORIDE (POUR BTL) OPTIME
TOPICAL | Status: DC | PRN
  Administered 2021-07-19: 2000 mL

## 2021-07-19 MED ORDER — FENTANYL CITRATE (PF) 250 MCG/5ML IJ SOLN
INTRAMUSCULAR | Status: AC
  Filled 2021-07-19: qty 5

## 2021-07-19 MED ORDER — CEFAZOLIN SODIUM-DEXTROSE 2-4 GM/100ML-% IV SOLN: 2.0000 g | Freq: Four times a day (QID) | INTRAVENOUS | Status: DC

## 2021-07-19 MED ORDER — FENTANYL CITRATE (PF) 250 MCG/5ML IJ SOLN
INTRAMUSCULAR | Status: DC | PRN
Start: 2021-07-19 — End: 2021-07-19
  Administered 2021-07-19: 100 ug via INTRAVENOUS

## 2021-07-19 MED ORDER — TRANEXAMIC ACID-NACL 1000-0.7 MG/100ML-% IV SOLN
INTRAVENOUS | Status: DC | PRN
  Administered 2021-07-19: 1000 mg via INTRAVENOUS

## 2021-07-19 MED ORDER — ROCURONIUM BROMIDE 10 MG/ML (PF) SYRINGE
PREFILLED_SYRINGE | INTRAVENOUS | Status: DC | PRN
  Administered 2021-07-19: 70 mg via INTRAVENOUS
  Administered 2021-07-19: 30 mg via INTRAVENOUS

## 2021-07-19 MED ORDER — PROPOFOL 10 MG/ML IV BOLUS
INTRAVENOUS | Status: AC
  Filled 2021-07-19: qty 20

## 2021-07-19 MED ORDER — HYDROCODONE-ACETAMINOPHEN 5-325 MG PO TABS
1.0000 | ORAL_TABLET | ORAL | Status: DC | PRN
  Administered 2021-07-22: 1 via ORAL
  Filled 2021-07-19: qty 1

## 2021-07-19 MED ORDER — CALCIUM CHLORIDE 10 % IV SOLN
INTRAVENOUS | Status: DC | PRN
  Administered 2021-07-19: .2 g via INTRAVENOUS
  Administered 2021-07-19: .6 g via INTRAVENOUS
  Administered 2021-07-19: 1 g via INTRAVENOUS
  Administered 2021-07-19: .2 g via INTRAVENOUS

## 2021-07-19 MED ORDER — ENOXAPARIN SODIUM 30 MG/0.3ML IJ SOSY
30.0000 mg | PREFILLED_SYRINGE | INTRAMUSCULAR | Status: DC
  Administered 2021-07-20: 30 mg via SUBCUTANEOUS
  Filled 2021-07-19: qty 0.3

## 2021-07-19 MED ORDER — SUGAMMADEX SODIUM 200 MG/2ML IV SOLN
INTRAVENOUS | Status: DC | PRN
  Administered 2021-07-19: 200 mg via INTRAVENOUS
  Administered 2021-07-19: 100 mg via INTRAVENOUS

## 2021-07-19 MED ORDER — ACETAMINOPHEN 500 MG PO TABS
500.0000 mg | ORAL_TABLET | Freq: Four times a day (QID) | ORAL | Status: AC
  Administered 2021-07-19 – 2021-07-20 (×3): 500 mg via ORAL
  Filled 2021-07-19 (×4): qty 1

## 2021-07-19 MED ORDER — TRANEXAMIC ACID-NACL 1000-0.7 MG/100ML-% IV SOLN
1000.0000 mg | Freq: Once | INTRAVENOUS | Status: AC
  Administered 2021-07-19: 1000 mg via INTRAVENOUS
  Filled 2021-07-19: qty 100

## 2021-07-19 MED ORDER — PHENYLEPHRINE 80 MCG/ML (10ML) SYRINGE FOR IV PUSH (FOR BLOOD PRESSURE SUPPORT)
PREFILLED_SYRINGE | INTRAVENOUS | Status: AC
Start: 2021-07-19 — End: ?
  Filled 2021-07-19: qty 10

## 2021-07-19 MED ORDER — VASOPRESSIN 20 UNIT/ML IV SOLN
INTRAVENOUS | Status: DC | PRN
  Administered 2021-07-19 (×3): 1 [IU] via INTRAVENOUS

## 2021-07-19 MED ORDER — ONDANSETRON HCL 4 MG/2ML IJ SOLN
INTRAMUSCULAR | Status: AC
  Filled 2021-07-19: qty 2

## 2021-07-19 MED ORDER — DEXAMETHASONE SODIUM PHOSPHATE 10 MG/ML IJ SOLN
INTRAMUSCULAR | Status: DC | PRN
  Administered 2021-07-19: 5 mg via INTRAVENOUS

## 2021-07-19 SURGICAL SUPPLY — 54 items
BAG COUNTER SPONGE SURGICOUNT (BAG) ×2 IMPLANT
BIT DRILL CANN 16 HIP (BIT) ×1 IMPLANT
BIT DRILL CANN STP 6/9 HIP (BIT) ×1 IMPLANT
BIT DRILL SHORT 4.2 (BIT) IMPLANT
BNDG COHESIVE 6X5 TAN NS LF (GAUZE/BANDAGES/DRESSINGS) ×2 IMPLANT
CANISTER WOUND CARE 500ML ATS (WOUND CARE) ×1 IMPLANT
CHLORAPREP W/TINT 26 (MISCELLANEOUS) ×2 IMPLANT
COVER PERINEAL POST (MISCELLANEOUS) ×2 IMPLANT
COVER SURGICAL LIGHT HANDLE (MISCELLANEOUS) ×2 IMPLANT
DERMABOND ADVANCED (GAUZE/BANDAGES/DRESSINGS) ×2
DERMABOND ADVANCED .7 DNX12 (GAUZE/BANDAGES/DRESSINGS) ×2 IMPLANT
DRAPE C-ARM 42X72 X-RAY (DRAPES) ×2 IMPLANT
DRAPE C-ARMOR (DRAPES) ×3 IMPLANT
DRAPE STERI IOBAN 125X83 (DRAPES) ×2 IMPLANT
DRESSING PREVENA PLUS CUSTOM (GAUZE/BANDAGES/DRESSINGS) IMPLANT
DRILL BIT SHORT 4.2 (BIT) ×2
DRSG MEPILEX BORDER 4X8 (GAUZE/BANDAGES/DRESSINGS) ×2 IMPLANT
DRSG PREVENA PLUS CUSTOM (GAUZE/BANDAGES/DRESSINGS) ×2
ELECT REM PT RETURN 9FT ADLT (ELECTROSURGICAL) ×2
ELECTRODE REM PT RTRN 9FT ADLT (ELECTROSURGICAL) ×1 IMPLANT
GLOVE BIOGEL PI IND STRL 8 (GLOVE) ×2 IMPLANT
GLOVE BIOGEL PI INDICATOR 8 (GLOVE) ×2
GLOVE ECLIPSE 7.5 STRL STRAW (GLOVE) ×4 IMPLANT
GLOVE SURG ORTHO LTX SZ7.5 (GLOVE) ×4 IMPLANT
GOWN STRL REIN XL XLG (GOWN DISPOSABLE) ×2 IMPLANT
GOWN STRL REUS W/ TWL LRG LVL3 (GOWN DISPOSABLE) ×1 IMPLANT
GOWN STRL REUS W/ TWL XL LVL3 (GOWN DISPOSABLE) ×1 IMPLANT
GOWN STRL REUS W/TWL LRG LVL3 (GOWN DISPOSABLE) ×1
GOWN STRL REUS W/TWL XL LVL3 (GOWN DISPOSABLE) ×1
GUIDEWIRE 3.2X400 (WIRE) ×3 IMPLANT
KIT BASIN OR (CUSTOM PROCEDURE TRAY) ×2 IMPLANT
KIT DRSG PREVENA PLUS 7DAY 125 (MISCELLANEOUS) ×1 IMPLANT
MANIFOLD NEPTUNE II (INSTRUMENTS) ×2 IMPLANT
NAIL 10/130 TI CAN TFNA 360HIP (Nail) ×1 IMPLANT
NS IRRIG 1000ML POUR BTL (IV SOLUTION) ×3 IMPLANT
PACK GENERAL/GYN (CUSTOM PROCEDURE TRAY) ×2 IMPLANT
PAD ARMBOARD 7.5X6 YLW CONV (MISCELLANEOUS) ×4 IMPLANT
REAMER ROD 3.8 BALL TIP 3X950 (ORTHOPEDIC DISPOSABLE SUPPLIES) ×1 IMPLANT
SCREW LAG TFNA 90 HIP (Screw) ×1 IMPLANT
SCREW LOCK IM 46X5X TI NIOBIUM (Screw) IMPLANT
SCREW LOCK IM NAIL 5X46 (Screw) ×1 IMPLANT
SCREW LOCK IM NAIL 5X50 (Screw) ×1 IMPLANT
SPONGE T-LAP 18X18 ~~LOC~~+RFID (SPONGE) ×4 IMPLANT
SUT MNCRL+ AB 3-0 CT1 36 (SUTURE) ×2 IMPLANT
SUT MON AB 2-0 CT1 36 (SUTURE) ×4 IMPLANT
SUT MON AB 3-0 SH 27 (SUTURE) ×1
SUT MON AB 3-0 SH27 (SUTURE) IMPLANT
SUT MONOCRYL AB 3-0 CT1 36IN (SUTURE) ×1
SUT PDS AB 1 CT  36 (SUTURE) ×2
SUT PDS AB 1 CT 36 (SUTURE) ×2 IMPLANT
SUT VIC AB 0 CT1 27 (SUTURE) ×3
SUT VIC AB 0 CT1 27XBRD ANBCTR (SUTURE) ×2 IMPLANT
SUT VIC AB 2-0 CT1 (SUTURE) ×1 IMPLANT
TOWEL GREEN STERILE (TOWEL DISPOSABLE) ×4 IMPLANT

## 2021-07-19 NOTE — Progress Notes (Signed)
PROGRESS NOTE    Gary Nelson  BTD:[id number] DOB: 02-17-1940 DOA: 07/17/2021 PCP: Ledell Noss[facility]    Brief Narrative:    Gary Nelson is a 81 y.o. 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  In the ED patient was on 2 L of nasal cannula oxygen blood pressure was stable.  Hemoglobin of 7.9.  Creatinine was 1.4.  Chest x-ray showed mild right lung base opacity suspected to be effusion versus atelectasis.  X-ray of the left humerus showed a nondisplaced intertrochanteric fracture of the proximal left femur.   CT left hip with acute comminuted fracture proximal left femur, 4.6 cm x 2.4 cm x 8.1 cm chronic appearing collection perimuscular fluid within the soft tissues of the left lateral pelvic wall.  Apparently, he was also noted to have sinus pauses x2 lasting 1 second noted at [facility].  Patient was evaluated by the anesthesiologist at [facility] with recommendation of transfer to [facility] for surgical invention and cardiac evaluation.     Assessment & Plan:  Principal Problem:   Closed comminuted intertrochanteric fracture of left femur (HCC) Active Problems:   CHF, chronic (HCC)   Diabetes mellitus ([place])   Colon cancer ([place])   Venous insufficiency of both lower extremities   Morbid obesity ([place])   Sleep apnea   Diarrhea   Cirrhosis of liver (HCC)   Benign essential hypertension   Stage 3b chronic kidney disease (CKD) (HCC)   HLD (hyperlipidemia)   BPH (benign prostatic hyperplasia)   Chronic respiratory failure with hypoxia (HCC)   Abnormal urine   Left  proximal femur comminuted fracture Status post orthopedic surgery at this time.  Follow-up plan as per orthopedics   Sinus pause Lasting for 1 second x 2.  Cardiology was consulted.  Amlodipine on hold.  Abnormal urinalysis. Urinalysis WBC more than 50.  Urine culture shows more than 100,000 colonies of gram-negative rods.  Patient is already on IV Rocephin  Bilateral lower extremity cellulitis with left anterior shin ulceration On IV Rocephin.  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  Mild leukocytosis present.  Dysuria/increased urinary frequency On IV Rocephin.  WBC in urine more than 50 per high-power field.   Chronic hypoxic respiratory failure with underlying COPD. COPD 2 L of oxygen by nasal cannula at baseline.  Essential hypertension Patient is on telmisartan and amlodipine at home.  Currently amlodipine on hold.  Chronic diastolic congestive heart failure, compensated Chest x-ray with no pulmonary edema.  TTE 09/11/2012 with LVEF 60%.  Continue Lasix strict intake and output charting Daily  Hyperlipidemia Continue Lipitor.    CKD stage IIIb Baseline creatinine 1.2-1.4.  Follows with nephrology outpatient, Dr. Theador Hawthorne.  Latest creatinine at 1.7.  We will continue to monitor.  BPH:  Continue Flomax 0.4 mg p.o. daily   Type 2 diabetes mellitus Hemoglobin A1c  of 5.4, well controlled.  Home regimen includes NPH 5 units BIDAC.  Continue sliding scale insulin  Peripheral neuropathy:  Continue Neurontin.   EtOH use disorder No report of alcohol usage in the last 6 months.  Currently on CIWA protocol  with symptom triggered Ativan.  Continue Multivitamin, thiamine, folic acid   Umbilical hernia: Reducible.   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 Delton Coombes as outpatient.  Seen in clinic 06/27/2021.  Poorly differentiated adenocarcinoma.  Currently  on Bevacizumab + Trifluridine/Tipiracil q28d.  Plan is to follow-up with medical oncology as scheduled as outpatient   Morbid obesity Body mass index is 45.35 kg/m.  Would benefit from ongoing weight loss as outpatient.      DVT prophylaxis: Place and maintain sequential compression device Start: 07/17/21 2053   Code Status:     Code Status: Full Code  Disposition: Likely to rehabilitation  Status is: Inpatient  Remains inpatient appropriate because: Status post surgical intervention, possible need for rehabitation.   Family Communication:  Communicated with the patient at bedside  Consultants:  Orthopedics  Procedures:  Left hip surgery on 07/19/2021  Antimicrobials:  Rocephin IV 07/18/2021>  Anti-infectives (From admission, onward)    Start     Dose/Rate Route Frequency Ordered Stop   07/19/21 0827  vancomycin (VANCOCIN) powder  Status:  Discontinued          As needed 07/19/21 0827 07/19/21 0921   07/18/21 1500  cefTRIAXone (ROCEPHIN) 2 g in sodium chloride 0.9 % 100 mL IVPB       Note to Pharmacy: Start after UA/culture obtained   2 g 200 mL/hr over 30 Minutes Intravenous Every 24 hours 07/18/21 1405 07/25/21 1459      Subjective: Today, patient was seen and examined at bedside.  Seen after surgical intervention.  Patient denies any nausea vomiting overt pain at this time.  Denies shortness of breath states that he has sleep apnea and uses CPAP at nighttime and oxygen at night  Objective: Vitals:   07/19/21 1300 07/19/21 1315 07/19/21 1330 07/19/21 1401  BP: 104/66 (!) 125/56 112/64 (!) 131/57  Pulse: (!) 108 (!) 103 (!) 102 99  Resp: 16 13 12 17   Temp:   98.6 F (37 C) (!) 97.4 F (36.3 C)  TempSrc:    Oral  SpO2: 99% 99% 97% 94%  Weight:      Height:        Intake/Output Summary (Last 24 hours) at 07/19/2021 1610 Last data filed at 07/19/2021 1030 Gross per 24 hour  Intake 3250 ml  Output 1665 ml  Net 1585 ml   Filed Weights   07/17/21 1453  07/19/21 0500  Weight: 108.9 kg 113.2 kg    Physical Examination: Body mass index is 47.15 kg/m.  General: Morbidly obese built, not in obvious distress, on nasal cannula oxygen HENT:   Pallor noted.  Oral mucosa is moist.  Chest:  Clear breath sounds.  Diminished breath sounds bilaterally. No crackles or wheezes.  CVS: S1 &S2 heard. No murmur.  Regular rate and rhythm. Abdomen: Soft, nontender, nondistended.  Bowel sounds are heard.   Extremities: No cyanosis, clubbing or edema.  Peripheral pulses are palpable.  Left thigh with long incision covered with dressing with mild edema.  Able to wiggle toes.  Distal capillary refill present. Psych: Alert, awake and oriented, normal mood CNS:  No cranial nerve deficits.  Power equal in all extremities.   Skin: Warm and dry.  Status post left lower extremity surgery   Data Reviewed:   CBC: Recent Labs  Lab 07/17/21 1515 07/18/21 0518 07/19/21 0140  WBC 8.3 11.0* 12.4*  NEUTROABS 6.0  --   --  HGB 7.9* 7.8* 7.2*  HCT 24.3* 24.5* 22.5*  MCV 107.0* 110.4* 108.2*  PLT 279 268 124    Basic Metabolic Panel: Recent Labs  Lab 07/17/21 1515 07/18/21 0518 07/19/21 0140  NA 140 139 139  K 4.5 5.3* 5.1  CL 111 111 112*  CO2 24 26 24   GLUCOSE 112* 124* 100*  BUN 24* 26* 28*  CREATININE 1.43* 1.45* 1.77*  CALCIUM 8.0* 7.9* 7.5*  MG 2.0  --  2.1    Liver Function Tests: No results for input(s): "AST", "ALT", "ALKPHOS", "BILITOT", "PROT", "ALBUMIN" in the last 168 hours.   Radiology Studies: DG FEMUR MIN 2 VIEWS LEFT  Result Date: 07/19/2021 CLINICAL DATA:  Postoperative check performed in PACU. EXAM: LEFT FEMUR 2 VIEWS COMPARISON:  Pelvis and left femur radiographs 07/17/2021 FINDINGS: Interval left femoral long intramedullary nail fixation with 2 distal interlocking screws and proximal femoral neck cephalomedullary screw. Improved alignment with persistent intertrochanteric and proximal left femoral diaphyseal fracture lines noted  and mild proximal femoral diaphyseal cortical step-off. Expected postoperative changes including lateral hip soft tissue swelling and subcutaneous air. Mild medial knee compartment joint space narrowing. Mild-to-moderate peripheral mediolateral compartment of the knee degenerative osteophytes. Mild to moderate peripheral patellar degenerative osteophytes. Arterial vascular calcifications are noted. Partial visualization of the prior calcific densities overlying the left buttock, likely injection granulomas. IMPRESSION: Interval proximal femoral ORIF.  No evidence of hardware failure. Electronically Signed   By: Yvonne Kendall M.D.   On: 07/19/2021 12:14   DG FEMUR MIN 2 VIEWS LEFT  Result Date: 07/19/2021 CLINICAL DATA:  Operative fluoroscopy for left femoral nail fixation. EXAM: LEFT FEMUR 2 VIEWS COMPARISON:  Left femur radiographs and left pelvis radiographs 07/17/2021 FINDINGS: Images were performed intraoperatively without the presence of a radiologist. The patient is undergoing long intramedullary nail fixation with proximal femoral neck screw traversing the previously seen intertrochanteric fracture. Near anatomic alignment Total fluoroscopy images: 6 Total fluoroscopy time: 41 seconds Total dose: Radiation Exposure Index (as provided by the fluoroscopic device): 26.76 mGy air Kerma Please see intraoperative findings for further detail. IMPRESSION: Intraoperative fluoro for left proximal femoral ORIF. Electronically Signed   By: Yvonne Kendall M.D.   On: 07/19/2021 10:09   DG C-Arm 1-60 Min-No Report  Result Date: 07/19/2021 Fluoroscopy was utilized by the requesting physician.  No radiographic interpretation.   DG C-Arm 1-60 Min-No Report  Result Date: 07/19/2021 Fluoroscopy was utilized by the requesting physician.  No radiographic interpretation.   CT HIP LEFT WO CONTRAST  Result Date: 07/17/2021 CLINICAL DATA:  Status post fall. EXAM: CT OF THE LEFT HIP WITHOUT CONTRAST TECHNIQUE:  Multidetector CT imaging of the left hip was performed according to the standard protocol. Multiplanar CT image reconstructions were also generated. RADIATION DOSE REDUCTION: This exam was performed according to the departmental dose-optimization program which includes automated exposure control, adjustment of the mA and/or kV according to patient size and/or use of iterative reconstruction technique. COMPARISON:  None Available. FINDINGS: Bones/Joint/Cartilage Acute, comminuted fracture deformity is seen extending through the inter trochanteric region of the proximal left femur. Involvement of the adjacent portion of the proximal left femoral shaft is seen. There is no evidence of dislocation. Ligaments Suboptimally assessed by CT. Muscles and Tendons Limited in evaluation and otherwise unremarkable. Soft tissues A 4.6 cm x 2.4 cm x 8.1 cm chronic appearing collection of para muscular fluid (approximately 19.97 Hounsfield units) is seen within the soft tissues of the lateral left pelvic wall. This extends to the  level of the left hip. Soft tissue swelling is seen adjacent to the previously noted fracture site with chronic appearing soft tissue calcifications noted along lateral aspect of the left hip. IMPRESSION: 1. Acute, comminuted fracture of the proximal left femur. 2. 4.6 cm x 2.4 cm x 8.1 cm chronic appearing collection of para muscular fluid within the soft tissues of the lateral left pelvic wall, as described above. Electronically Signed   By: Virgina Norfolk M.D.   On: 07/17/2021 18:12   DG Pelvis 1-2 Views  Result Date: 07/17/2021 CLINICAL DATA:  Fall.  Left hip pain. EXAM: PELVIS - 1-2 VIEW COMPARISON:  None Available. FINDINGS: Intertrochanteric fracture of the proximal left femur, without significant displacement. No angulation. No other fractures. Skeletal structures are diffusely demineralized. Irregularity and flattening along the lateral right ilium consistent with a bone graft harvesting  site. Hip joints, SI joints and symphysis pubis are normally aligned. 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 IMPRESSION: 1. Intertrochanteric fracture of the proximal left femur without significant displacement or angulation. Electronically Signed   By: Lajean Manes M.D.   On: 07/17/2021 16:39   DG Femur Min 2 Views Left  Result Date: 07/17/2021 CLINICAL DATA:  Fall.  Left hip pain. EXAM: LEFT FEMUR 2 VIEWS COMPARISON:  None Available. FINDINGS: On a single view, there is a nondisplaced, non comminuted intertrochanteric fracture of the proximal femur. No other fractures.  No bone lesions. Hip and knee joints are normally aligned. Multiple rounded to oval calcifications project lateral to the left hip and hemipelvis, suspected to be injection granuloma. IMPRESSION: 1. Nondisplaced, non comminuted intertrochanteric fracture of the proximal left femur. Electronically Signed   By: Lajean Manes M.D.   On: 07/17/2021 16:37   DG Chest 1 View  Result Date: 07/17/2021 CLINICAL DATA:  Left hip pain. EXAM: CHEST  1 VIEW COMPARISON:  09/16/2015. FINDINGS: Cardiac silhouette normal in size.  No mediastinal or hilar masses. Hazy opacity at the right lung base. Remainder of the lungs is clear. Suspected small right effusion. No convincing left pleural effusion. No pneumothorax. Right anterior chest wall, internal jugular, Port-A-Cath is stable. Stable changes from a previous anterior and posterior cervical spine fusion and laminectomies. IMPRESSION: 1. Mild opacity at the right lung base suspected to be a small effusion with associated atelectasis. Consider pneumonia if there are consistent clinical findings. 2. No other evidence of acute cardiopulmonary disease. No pulmonary edema. Electronically Signed   By: Lajean Manes M.D.   On: 07/17/2021 16:35      LOS: 2 days    Flora Lipps, MD [facility] Available via Epic secure  chat 7am-7pm After these hours, please refer to coverage provider listed on [email or web address] 07/19/2021, 4:10 PM

## 2021-07-19 NOTE — Discharge Instructions (Addendum)
Discharge instructions for Dr. Georgeanna Harrison, M.D., Orthopaedic Surgeon, [place]:  Diet: As you were doing prior to hospitalization, unless instructed otherwise by medical team, dietary/nutrition team, etc. Dressing:  The PREVENA incisional VAC (iVAC) should be left on for 7 days and then removed.  After that the wounds may be dressed with dry sterile gauze dressing and Tegaderm (clear adhesive plastic squares).  If the PREVENA hose is connected to a large VAC machine in the hospital, this should be switched to the small portable PREVENA unit prior to discharge.  Please make sure a responsible party understands how to operate the [facility] unit before discharge, including how to charge the unit.  Please note that the [facility] dressing will ONLY work if connected to suction!  If the [facility] unit fails and cannot be fixed/restored to normal operation to restore suction, the sponge and plastic dressing should be removed and replaced with gauze/Tegaderm.  If the dressing loses suction due to a leak attempt to seal the leak with Tegaderm or remove the sponge plastic dressing replace with gauze/Tegaderm. Shower:  Unless otherwise specified, may shower but keep the wounds dry, use an occlusive plastic wrap, NO SOAKING IN TUB.  If the bandage gets wet, change with a clean dry gauze.  Unless otherwise specified, after 5 days dressing(s) should be removed (7 days for PREVENA dressings) and wound(s) may get wet in the shower by allowing water to gently run over.  Again, no soaking in tub, and do NOT submerge for at least 2 weeks!!! Activity:  Increase activity slowly as tolerated.  If you right leg is injured or immobilized, no driving for 6 weeks or until discussed with your surgeon.  If you have an injury or immobilization of the left lower extremity you may not operate a clutch. Please note that driving with any kind of immobilization for the upper extremity (sling, shoulder brace,  splint, cast, etc.) may also be considered impaired driving and should not be attempted. Weight Bearing: WEIGHTBEARING AS TOLERATED (WBAT) on the LEFT LOWER EXTREMITY. To prevent constipation: You may use over-the-counter stool softener(s) such as Colace (over the counter) 100 mg by mouth twice a day and/or Miralax (over the counter) for constipation as needed.  Drink plenty of fluids (prune juice may be helpful) and high fiber foods.  Itching:  If you experience itching with your medications, try taking only a single pain pill, or even half a pain pill at a time.  You can also use benadryl over the counter for itching or also to help with sleep.  Precautions:  If you experience chest pain or shortness of breath - call 911 immediately for transfer to the hospital emergency department!! Medications: Please contact the clinic during office hours (Monday through Friday, 0800 to 1600) if you need a refill on any medications.  Please monitor medications and allow 24 to 48 hours to process refill request!!!!  Please note that only medications directly related to the surgery can be prescribed.  For other medications (e.g. blood pressure medicines, sleeping medicines, etc.), please contact the prescribing physician or your primary care provider. DVT Prophylaxis: Unless recommended otherwise by medical team, Lovenox 30 mg daily x6 weeks postoperatively.  If you develop a fever greater that 101.1 deg F, purulent drainage from wound, increased redness or drainage from wound, or calf pain -- Call the office at [phone].

## 2021-07-19 NOTE — Transfer of Care (Signed)
Immediate Anesthesia Transfer of Care Note  Patient: Gary Nelson  Procedure(s) Performed: INTRAMEDULLARY (IM) NAIL INTERTROCHANTRIC (Left) APPLICATION OF WOUND VAC (Left)  Patient Location: PACU  Anesthesia Type:General  Level of Consciousness: drowsy, patient cooperative and responds to stimulation  Airway & Oxygen Therapy: Patient Spontanous Breathing and Patient connected to face mask oxygen  Post-op Assessment: Report given to RN and Post -op Vital signs reviewed and stable  Post vital signs: Reviewed and stable  Last Vitals:  Vitals Value Taken Time  BP 81/66 07/19/21 1116  Temp    Pulse 105 07/19/21 1117  Resp 15 07/19/21 1117  SpO2 96 % 07/19/21 1117  Vitals shown include unvalidated device data.  Last Pain:  Vitals:   07/19/21 0429  TempSrc: Oral  PainSc:          Complications: No notable events documented.

## 2021-07-19 NOTE — Consult Note (Signed)
Orthopaedic Consult  Date/Time: 07/19/21 6:43 AM  Patient Name: Gary Nelson  Attending Physician: Flora Lipps, MD    ASSESSMENT & PLAN  Orthopaedic Assessment: 81 y.o. male with multiple medical comorbidities, chronic active left lower extremity cellulitis, chronic bilateral lower extremity venous stasis, with left intertrochanteric fracture.  Reductions/Procedures/Splinting/Anesthesia Performed: Reductions: None Splinting/casting: None Procedure(s): None Anesthesia: N/A  Plan: Patient was transferred to this hospital due to multiple medical comorbidities, found to be too medically complex for surgery at outside hospital.  Due to ambulatory status prior to left intertrochanteric fracture, patient was recommended to undergo surgical treatment with cephalomedullary nail fixation in order to restore mobility and previous level of function.  The patient understands he is extremely high risk for surgery and extremely high risk for adverse outcomes due to his many medical comorbidities.  He understands that his risk of infection may be higher due to vascular issues, venous stasis, chronic infection and chronic cellulitis, and lower extremity edema.  Risks of the proposed surgical treatment were discussed with the patient, including bleeding, wound healing complications, infection, damage to surrounding structures, persistent pain, stiffness, lack of improvement, potential for subsequent arthritis or worsening of pre-existing arthritis, nonunion, malunion, and need for further surgery, as well as complications related to anesthesia, cardiovascular complications, and death.  All questions were answered to the patient's satisfaction.  He understands all of this and wishes to proceed with surgery.    Georgeanna Harrison M.D. Orthopaedic Surgery [facility]   Medical Decision Making  Amount/complexity of data: Is there a pathologic fracture (e.g. neoplastic, osteoporotic  insufficiency fracture)? Yes Independent interpretation of radiographic studies: Yes Review of radiology results (e.g. reports): Yes Tests ordered (e.g. additional radiographic studies, labs): Yes Lab results reviewed: Yes Reviewed old records: Yes History from another source (independent historian, e.g. family/friend/etc.): Yes Risk: Patient receiving IV controlled substances for pain: Yes Fracture requiring manipulation: No Urgent or emergent (non-elective) surgery likely this admission: Yes Presence of medical comorbidities and/or surgical risk factors (e.g. current smoker, CAD, diabetes, COPD, CKD, etc.): Yes Closed fracture management WITHOUT manipulation: No Urgent minor procedure (e.g. joint aspiration, compartment pressure measurement, etc.): No Will likely need surgery as an outpatient: No     HPI Gary Nelson is a 81 y.o. male. Orthopaedic consultation has specifically been requested to address this patient's current musculoskeletal presentation. He sustained a fall and had immediate pain in the left hip/thigh.  Pain is sharp and severe.  Pain is worse movement and better with rest and immobilization.  Since the fall the patient was not able to ambulate or bear weight due to pain.  At baseline the patient is ambulatory with a walker.  He does report history of intramedullary nail fixation for an injury to the right lower extremity, after which she had the nail removed.  He does report chronic distal sensory deficits with essentially no sensation in the distal left lower extremity to the proximal leg level.  He has chronic bilateral venous insufficiency and associated skin changes.  He reports a history in the left lower extremity of chronic cellulitis dating back to 1994.   PMH Past Medical History:  Diagnosis Date   Adenocarcinoma of colon ([place])    RLL -  Pulmonary nodule (06/2015, concerning for mets vs primary; to see [facility] Dr. 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   Neuropathy    OSA on CPAP    Portal hypertension ([place]) 07/28/2019   Prostatitis    Pulmonary nodule    Renal disorder    Sleep apnea      PSH Past Surgical History:  Procedure Laterality Date   BACK SURGERY     CATARACT EXTRACTION     left   CATARACT EXTRACTION W/PHACO Right 04/11/2012   Procedure: CATARACT EXTRACTION PHACO AND INTRAOCULAR LENS PLACEMENT ([place]);  Surgeon: Tonny Branch, MD;  Location: [facility];  Service: Ophthalmology;  Laterality: Right;  CDE:62.48   CERVICAL FUSION     c4-c5   CHOLECYSTECTOMY     COLON SURGERY  09/29/12   Colon resection for cancer   COLONOSCOPY     EYE SURGERY     cataract   GIVENS CAPSULE STUDY N/A 03/15/2015   Procedure: GIVENS CAPSULE STUDY;  Surgeon: Rogene Houston, MD;  Location: [facility];  Service: Endoscopy;  Laterality: N/A;  730   LEG SURGERY     ORIF FEMUR FRACTURE     right   POSTERIOR CERVICAL FUSION/FORAMINOTOMY N/A 07/27/2015   Procedure: Cervical four to Cervical seven Laminectomy with Cervical four to Thoracic one Dorsal internal fixation and fusion;  Surgeon: Kevan Ny Ditty, MD;  Location: [facility];  Service: Neurosurgery;  Laterality: N/A;  C4 to C7 Laminectomy with C4 to T1 Dorsal internal fixation and fusion   Home Medications Prior to Admission medications   Medication Sig Start Date End Date Taking? Authorizing Provider  amLODipine (NORVASC) 5 MG tablet TAKE ONE TABLET ONCE DAILY Patient taking differently: Take 5 mg by mouth daily. 06/28/21  Yes Derek Jack, MD  aspirin EC 81 MG tablet Take 81 mg by mouth daily.   Yes [provider]   atorvastatin (LIPITOR) 10 MG tablet Take 10 mg by mouth daily at 6 PM.  06/25/17 07/17/21 Yes [provider]  Cholecalciferol 25 MCG (1000 UT) capsule Take 1,000 Units by mouth daily. 04/09/21 04/09/22 Yes [provider]  [employer] B-12 5000 MCG SUBL Place 1 tablet (5,000 mcg total) under the tongue daily. 12/04/19  Yes Derek Jack, MD  diphenoxylate-atropine (LOMOTIL) 2.5-0.025 MG tablet Take 1 tablet by mouth 4 (four) times daily as needed for diarrhea or loose stools. 01/03/21  Yes Derek Jack, MD  furosemide (LASIX) 20 MG tablet Take 20 mg by mouth 2 (two) times daily.   Yes [provider]  gabapentin (NEURONTIN) 300 MG capsule Take 2 capsules (600 mg total) by mouth 3 (three) times daily. Patient taking differently: Take 600 mg by mouth 2 (two) times daily. 07/31/15  Yes Ditty, Kevan Ny, MD  insulin NPH (HUMULIN N,NOVOLIN N) 100 UNIT/ML injection Inject 10 Units into the skin 2 (two) times daily before a meal. Per pt, 5 units every morning and 5 units in the evening   Yes [provider]  silver sulfADIAZINE (SILVADENE) 1 % cream Apply 1 application topically as needed. 12/07/17  Yes [provider]  tamsulosin (FLOMAX) 0.4 MG CAPS capsule Take 1 capsule by mouth daily. 05/24/20  Yes [provider]  telmisartan (MICARDIS) 20 MG tablet Take 20 mg by  mouth daily. 06/16/21  Yes [provider]  ketoconazole (NIZORAL) 2 % cream Apply topically as needed. Patient not taking: Reported on 07/17/2021 12/25/19   [provider]  lidocaine-prilocaine (EMLA) cream Apply to affected area once Patient not taking: Reported on 07/17/2021 03/25/21   Derek Jack, MD  naproxen (NAPROSYN) 500 MG tablet Take by mouth. Patient not taking: Reported on 07/17/2021    [provider]  nitrofurantoin, macrocrystal-monohydrate, (MACROBID) 100 MG capsule Take 1 capsule (100 mg total) by mouth 2 (two) times daily. Patient  not taking: Reported on 07/17/2021 05/10/21   Derek Jack, MD  OXYGEN Inhale 2 L into the lungs continuous. At night time only    [provider]  phenylephrine-shark liver oil-mineral oil-petrolatum (PREPARATION H) 0.25-14-74.9 % rectal ointment Place 1 application rectally as needed for hemorrhoids. Patient not taking: Reported on 07/17/2021    [provider]  prochlorperazine (COMPAZINE) 10 MG tablet Take 1 tablet (10 mg total) by mouth every 6 (six) hours as needed (Nausea or vomiting). Patient not taking: Reported on 07/17/2021 03/25/21   Derek Jack, MD  sucralfate (CARAFATE) 1 GM/10ML suspension SWISH AND SWALLOW 15ML FOUR TIMES DAILY Patient not taking: Reported on 07/17/2021 10/12/20   [provider]  trifluridine-tipiracil (LONSURF) 20-8.19 MG tablet Take 3 pills twice a day 1 hr after AM & PM meals on days 1-5, 8-12. Repeat every 28 days Patient not taking: Reported on 07/17/2021 06/13/21   Derek Jack, MD     Allergies No Known Allergies   Family History Family History  Problem Relation Age of Onset   Heart disease Father    Heart attack Father    Alzheimer's disease Mother     Social History Social History   Socioeconomic History   Marital status: Single    Spouse name: Not on file   Number of children: Not on file   Years of education: Not on file   Highest education level: Not on file  Occupational History   Not on file  Tobacco Use   Smoking status: Former    Packs/day: 1.00    Years: 55.00    Total pack years: 55.00    Types: Cigarettes    Start date: 02/07/1955   Smokeless tobacco: Never   Tobacco comments:    quit 2015  Vaping Use   Vaping Use: Never used  Substance and Sexual Activity   Alcohol use: Not Currently    Alcohol/week: 3.0 standard drinks of alcohol    Types: 3 Cans of beer per week   Drug use: No   Sexual activity: Yes    Birth control/protection: None  Other Topics Concern   Not on file   Social History Narrative   Not on file   Social Determinants of Health   Financial Resource Strain: Not on file  Food Insecurity: Not on file  Transportation Needs: Not on file  Physical Activity: Not on file  Stress: Not on file  Social Connections: Not on file  Intimate Partner Violence: Not on file     Review of Systems MSK: As noted per HPI above GI: No current Nausea/vomiting ENT: Denies sore throat, epistaxis CV: Denies chest pain  Resp: No current shortness of breath  Other than mentioned above, there are no Constitutional, Neurological, Psychiatric, ENT, Ophthalmological, Cardiovascular, Respiratory, GI, GU, Musculoskeletal, Integumentary, Lymphatic, Endocrine or Allergic issues.     Imaging  Independent interpretation of orthopaedic-relevant films: Multiple radiographic views of pelvis and left femur demonstrate left intertrochanteric  fracture.  Calcifications noted projecting in the region of soft tissues adjacent to the pelvis. CT left hip redemonstrate presence of left intertrochanteric fracture.  Redemonstration of peripelvic soft tissue calcifications.  Radiographic results: CT HIP LEFT WO CONTRAST  Result Date: 07/17/2021 CLINICAL DATA:  Status post fall. EXAM: CT OF THE LEFT HIP WITHOUT CONTRAST TECHNIQUE: Multidetector CT imaging of the left hip was performed according to the standard protocol. Multiplanar CT image reconstructions were also generated. RADIATION DOSE REDUCTION: This exam was performed according to the departmental dose-optimization program which includes automated exposure control, adjustment of the mA and/or kV according to patient size and/or use of iterative reconstruction technique. COMPARISON:  None Available. FINDINGS: Bones/Joint/Cartilage Acute, comminuted fracture deformity is seen extending through the inter trochanteric region of the proximal left femur. Involvement of the adjacent portion of the proximal left femoral shaft is seen. There  is no evidence of dislocation. Ligaments Suboptimally assessed by CT. Muscles and Tendons Limited in evaluation and otherwise unremarkable. Soft tissues A 4.6 cm x 2.4 cm x 8.1 cm chronic appearing collection of para muscular fluid (approximately 19.97 Hounsfield units) is seen within the soft tissues of the lateral left pelvic wall. This extends to the level of the left hip. Soft tissue swelling is seen adjacent to the previously noted fracture site with chronic appearing soft tissue calcifications noted along lateral aspect of the left hip. IMPRESSION: 1. Acute, comminuted fracture of the proximal left femur. 2. 4.6 cm x 2.4 cm x 8.1 cm chronic appearing collection of para muscular fluid within the soft tissues of the lateral left pelvic wall, as described above. Electronically Signed   By: Virgina Norfolk M.D.   On: 07/17/2021 18:12   DG Pelvis 1-2 Views  Result Date: 07/17/2021 CLINICAL DATA:  Fall.  Left hip pain. EXAM: PELVIS - 1-2 VIEW COMPARISON:  None Available. FINDINGS: Intertrochanteric fracture of the proximal left femur, without significant displacement. No angulation. No other fractures. Skeletal structures are diffusely demineralized. Irregularity and flattening along the lateral right ilium consistent with a bone graft harvesting site. Hip joints, SI joints and symphysis pubis are normally aligned. 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 IMPRESSION: 1. Intertrochanteric fracture of the proximal left femur without significant displacement or angulation. Electronically Signed   By: Lajean Manes M.D.   On: 07/17/2021 16:39   DG Femur Min 2 Views Left  Result Date: 07/17/2021 CLINICAL DATA:  Fall.  Left hip pain. EXAM: LEFT FEMUR 2 VIEWS COMPARISON:  None Available. FINDINGS: On a single view, there is a nondisplaced, non comminuted intertrochanteric fracture of the proximal femur. No other fractures.  No bone  lesions. Hip and knee joints are normally aligned. Multiple rounded to oval calcifications project lateral to the left hip and hemipelvis, suspected to be injection granuloma. IMPRESSION: 1. Nondisplaced, non comminuted intertrochanteric fracture of the proximal left femur. Electronically Signed   By: Lajean Manes M.D.   On: 07/17/2021 16:37   DG Chest 1 View  Result Date: 07/17/2021 CLINICAL DATA:  Left hip pain. EXAM: CHEST  1 VIEW COMPARISON:  09/16/2015. FINDINGS: Cardiac silhouette normal in size.  No mediastinal or hilar masses. Hazy opacity at the right lung base. Remainder of the lungs is clear. Suspected small right effusion. No convincing left pleural effusion. No pneumothorax. Right anterior chest wall, internal jugular, Port-A-Cath is stable. Stable changes from a previous anterior and posterior cervical spine fusion and laminectomies. IMPRESSION: 1. Mild opacity at  the right lung base suspected to be a small effusion with associated atelectasis. Consider pneumonia if there are consistent clinical findings. 2. No other evidence of acute cardiopulmonary disease. No pulmonary edema. Electronically Signed   By: Lajean Manes M.D.   On: 07/17/2021 16:35   US Venous Img Lower Unilateral Right  Result Date: 07/06/2021 CLINICAL DATA:  Right lower extremity edema. History of DVT. History of malignancy. Evaluate for acute or chronic DVT. EXAM: RIGHT LOWER EXTREMITY VENOUS DOPPLER ULTRASOUND TECHNIQUE: Gray-scale sonography with graded compression, as well as color Doppler and duplex ultrasound were performed to evaluate the lower extremity deep venous systems from the level of the common femoral vein and including the common femoral, femoral, profunda femoral, popliteal and calf veins including the posterior tibial, peroneal and gastrocnemius veins when visible. The superficial great saphenous vein was also interrogated. Spectral Doppler was utilized to evaluate flow at rest and with distal augmentation  maneuvers in the common femoral, femoral and popliteal veins. COMPARISON:  CT the chest, abdomen and pelvis-06/20/2021 FINDINGS: Examination is significantly degraded secondary to subcutaneous edema and poor sonographic window. Contralateral Common Femoral Vein: Respiratory phasicity is normal and symmetric with the symptomatic side. No evidence of thrombus. Normal compressibility. Common Femoral Vein: No evidence of thrombus. Normal compressibility, respiratory phasicity and response to augmentation. Saphenofemoral Junction: No evidence of thrombus. Normal compressibility and flow on color Doppler imaging. Profunda Femoral Vein: No evidence of thrombus. Normal compressibility and flow on color Doppler imaging. Femoral Vein: No evidence of thrombus. Normal compressibility, respiratory phasicity and response to augmentation. Popliteal Vein: No evidence of thrombus. Normal compressibility, respiratory phasicity and response to augmentation. Calf Veins: Suboptimally visualized. Superficial Great Saphenous Vein: No evidence of thrombus. Normal compressibility. Other Findings: There is a moderate amount subcutaneous edema at the level right lower leg and. IMPRESSION: No definitive acute occlusive DVT on this significantly body habitus degraded examination. Evaluation for chronic DVT is degraded secondary to poor sonographic window Electronically Signed   By: Sandi Mariscal M.D.   On: 07/06/2021 15:28   CT CHEST ABDOMEN PELVIS W CONTRAST  Result Date: 06/21/2021 CLINICAL DATA:  History of metastatic colorectal carcinoma. * Tracking Code: BO * EXAM: CT CHEST, ABDOMEN, AND PELVIS WITH CONTRAST TECHNIQUE: Multidetector CT imaging of the chest, abdomen and pelvis was performed following the standard protocol during bolus administration of intravenous contrast. RADIATION DOSE REDUCTION: This exam was performed according to the departmental dose-optimization program which includes automated exposure control, adjustment of the  mA and/or kV according to patient size and/or use of iterative reconstruction technique. CONTRAST:  150mL OMNIPAQUE IOHEXOL 300 MG/ML  SOLN COMPARISON:  PET-CT 01/06/2021 FINDINGS: CT CHEST FINDINGS Cardiovascular: Normal heart size. Coronary arterial vascular calcifications. Thoracic aortic vascular calcifications. Trace fluid superior pericardial recess. Mediastinum/Nodes: No enlarged axillary, mediastinal or hilar lymphadenopathy. Normal appearance of the esophagus. Lungs/Pleura: Subpleural reticular opacities within the anterior upper lobes bilaterally, similar to prior. Slight interval increase in size of small to moderate layering right pleural effusion. No pneumothorax. Interval increase in size of medial right lower lobe mass measuring 5.5 x 4.6 cm (image 46; series 2), previously 5.2 x 3.7 cm. No additional pulmonary lesions identified. Musculoskeletal: No aggressive or acute appearing osseous lesions. Multilevel thoracic spine degenerative changes. CT ABDOMEN PELVIS FINDINGS Hepatobiliary: Liver is normal in size and contour. Prior cholecystectomy. No intrahepatic or extrahepatic biliary ductal dilatation. Pancreas: Unremarkable Spleen: Unremarkable Adrenals/Urinary Tract: Adrenal glands are normal. Perinephric fat stranding. Normal enhancement of the kidneys bilaterally. No hydronephrosis. Circumferential  wall thickening of the urinary bladder. Small amount of gas in the urinary bladder. Similar-appearing 1.4 cm cyst interpolar region left kidney. Stomach/Bowel: Oral contrast material. No abnormal bowel wall thickening or evidence for bowel obstruction. Vascular/Lymphatic: Normal caliber abdominal aorta. Peripheral calcified atherosclerotic plaque. No retroperitoneal lymphadenopathy. Reproductive: Not well visualized. Other: Unchanged 7 mm peritoneal nodule (image 73; series 2). Musculoskeletal: Lumbar spine degenerative changes. No aggressive or acute appearing osseous lesions. IMPRESSION: Interval  increase in size of right lower lobe mass. Slight interval increase in size of moderate right pleural effusion. No additional sites of metastasis identified within the chest, abdomen or pelvis. Aortic atherosclerosis. Nonspecific circumferential wall thickening of the urinary bladder. Recommend clinical and laboratory correlation. Cystitis not excluded. Electronically Signed   By: Lovey Newcomer M.D.   On: 06/21/2021 12:23   Labs  Recent Labs    07/17/21 1515 07/18/21 0518 07/19/21 0140  WBC 8.3 11.0* 12.4*  HGB 7.9* 7.8* 7.2*  HCT 24.3* 24.5* 22.5*  PLT 279 268 253   Recent Labs    07/17/21 1515 07/18/21 0518 07/19/21 0140  NA 140 139 139  K 4.5 5.3* 5.1  CL 111 111 112*  CO2 24 26 24   BUN 24* 26* 28*  CREATININE 1.43* 1.45* 1.77*  GLUCOSE 112* 124* 100*  CALCIUM 8.0* 7.9* 7.5*   Lab Results  Component Value Date   INR 1.03 09/16/2015        Physical Examination  Patient is a 81 y.o. year old male who is chronically ill appearing and morbidly obese, mood is calm.  Orientation: oriented to person, place, time, and general circumstances  Vital Signs: BP (!) 148/67 (BP Location: Right Arm)   Pulse 92   Temp 98.5 F (36.9 C) (Oral)   Resp 20   Ht 5\' 1"  (1.549 m)   Wt 113.2 kg   SpO2 97%   BMI 47.15 kg/m    Gait: Unable to ambulate due to current injury, supine on stretcher.  Heart: Normal rate Lungs: Non-labored breathing Abdomen: Soft, Non-tender   Right Upper Extremity: Inspection: Multiple chronic appearing ecchymoses. Palpation: Nontender ROM: Full, painless Joint Stability: No instability Strength: Normal Skin: Intact with chronic ecchymoses as noted Peripheral Vascular: Well perfused Reflexes: No pathologic Sensation: Intact to light touch distally Lymph Nodes: None Palpable Coordination: Intact, normal   Left Upper Extremity: Inspection: Atraumatic Palpation: Nontender ROM: Full, painless Joint Stability: No instability Strength: Normal Skin:  Intact Peripheral Vascular: Well perfused Reflexes: No pathologic Sensation: Intact to light touch distally Lymph Nodes: None Palpable Coordination: Intact, normal    Right Lower Extremity: Inspection: Atraumatic Palpation: Mild tenderness over right trochanteric area ROM: Full, painless Joint Stability: No instability Strength: Normal Skin: Intact, with chronic venous stasis changes extending to the mid leg Peripheral Vascular: Palpable DP pulse.  1+ pitting edema. Reflexes: No pathologic Sensation: Intact to light touch distally Lymph Nodes: None Palpable Coordination: Intact, normal   Left Lower Extremity: Inspection: Held in external rotation Palpation: Tender at hip over site of known intertrochanteric fracture.  Also tender over knee. ROM: Hip range of motion severely limited due to known intertrochanteric fracture. Joint Stability: No instability to knee or distally. Strength: Intact dorsiflexion and plantarflexion through limited range of motion against manual resistance.  No EHL function.  Apparently baseline. Skin: Chronic cellulitis of leg.  Chronic venous stasis changes to leg. Peripheral Vascular: 2+ pitting edema.  DP pulse marked but not palpable. Reflexes: No pathologic Sensation: No sensation to light touch distally in  the superficial peroneal, deep peroneal, or tibial distributions, apparently baseline.  Sensation intact to light touch just below the knee. Lymph Nodes: None Palpable Coordination: Limited due to pain and injury    Pelvis: Skin: Intact Palpation: Nontender Stability: No instability      The review of the patient's medications does not in any way constitute an endorsement, by this clinician,  of their use, dosage, indications, route, efficacy, interactions, or other clinical parameters.  This note was generated within the [employer] EMR using Dragon medical speech recognition software and may contain inherent errors or omissions not intended by the  user. Grammatical and punctuation errors, random word insertions, deletions, pronoun errors and incomplete sentences are occasional consequences of this technology due to software limitations. Not all errors are caught or corrected.  Although every attempt is made to root out erroneus and incomplete transcription, the note may still not fully represent the intent or opinion of the author. If there are questions or concerns about the content of this note or information contained within the body of this dictation they should be addressed directly with the author for clarification.

## 2021-07-19 NOTE — Plan of Care (Signed)
No acute events overnight.    Problem: Education: Goal: Knowledge of General Education information will improve Description: Including pain rating scale, medication(s)/side effects and non-pharmacologic comfort measures Outcome: Progressing   Problem: Health Behavior/Discharge Planning: Goal: Ability to manage health-related needs will improve Outcome: Progressing   Problem: Clinical Measurements: Goal: Ability to maintain clinical measurements within normal limits will improve Outcome: Progressing Goal: Will remain free from infection Outcome: Progressing Goal: Diagnostic test results will improve Outcome: Progressing Goal: Respiratory complications will improve Outcome: Progressing Goal: Cardiovascular complication will be avoided Outcome: Progressing   Problem: Activity: Goal: Risk for activity intolerance will decrease Outcome: Progressing   Problem: Nutrition: Goal: Adequate nutrition will be maintained Outcome: Progressing   Problem: Coping: Goal: Level of anxiety will decrease Outcome: Progressing   Problem: Elimination: Goal: Will not experience complications related to bowel motility Outcome: Progressing Goal: Will not experience complications related to urinary retention Outcome: Progressing   Problem: Pain Managment: Goal: General experience of comfort will improve Outcome: Progressing   Problem: Safety: Goal: Ability to remain free from injury will improve Outcome: Progressing   Problem: Skin Integrity: Goal: Risk for impaired skin integrity will decrease Outcome: Progressing   Problem: Education: Goal: Ability to describe self-care measures that may prevent or decrease complications (Diabetes Survival Skills Education) will improve Outcome: Progressing Goal: Individualized Educational Video(s) Outcome: Progressing   Problem: Coping: Goal: Ability to adjust to condition or change in health will improve Outcome: Progressing   Problem: Fluid  Volume: Goal: Ability to maintain a balanced intake and output will improve Outcome: Progressing   Problem: Health Behavior/Discharge Planning: Goal: Ability to identify and utilize available resources and services will improve Outcome: Progressing Goal: Ability to manage health-related needs will improve Outcome: Progressing   Problem: Metabolic: Goal: Ability to maintain appropriate glucose levels will improve Outcome: Progressing   Problem: Nutritional: Goal: Maintenance of adequate nutrition will improve Outcome: Progressing Goal: Progress toward achieving an optimal weight will improve Outcome: Progressing   Problem: Skin Integrity: Goal: Risk for impaired skin integrity will decrease Outcome: Progressing   Problem: Tissue Perfusion: Goal: Adequacy of tissue perfusion will improve Outcome: Progressing   

## 2021-07-19 NOTE — Anesthesia Procedure Notes (Signed)
Procedure Name: Intubation Date/Time: 07/19/2021 7:34 AM  Performed by: Janace Litten, CRNAPre-anesthesia Checklist: Patient identified, Emergency Drugs available, Suction available and Patient being monitored Patient Re-evaluated:Patient Re-evaluated prior to induction Oxygen Delivery Method: Circle System Utilized Preoxygenation: Pre-oxygenation with 100% oxygen Induction Type: IV induction Ventilation: Mask ventilation without difficulty Laryngoscope Size: Mac and 4 Grade View: Grade I Tube type: Oral Tube size: 8.0 mm Number of attempts: 1 Airway Equipment and Method: Stylet Placement Confirmation: ETT inserted through vocal cords under direct vision, positive ETCO2 and breath sounds checked- equal and bilateral Secured at: 22 cm Tube secured with: Tape Dental Injury: Teeth and Oropharynx as per pre-operative assessment

## 2021-07-19 NOTE — Progress Notes (Signed)
Patient refused CPAP at this time. Currently on 3L [place], spo2 97%. Patient aware to let staff know if CPAP desired.

## 2021-07-19 NOTE — Progress Notes (Signed)
CPAP set up and patient patient placed on 10.0cmH20 via nasal mask with 3L O2 bleed in for HS use. RT will monitor as needed.

## 2021-07-19 NOTE — Op Note (Signed)
OPERATIVE NOTE  Gary Nelson male 81 y.o. 07/19/2021  PREOPERATIVE DIAGNOSIS: Left intertrochanteric fracture Morbid obesity Osteoporosis with current left proximal femur fracture  POSTOPERATIVE DIAGNOSIS: Left peritrochanteric fracture with subtrochanteric extension (S01.093A) Morbid obesity (E66.01) Osteoporosis with current left proximal femur fracture (M80.052A)  PROCEDURE(S): Left femur long cephalomedullary nail with 2 distal interlock screws (35573) Placement of negative pressure wound therapy, with DME (22025) Operative use of fluoroscopy for above procedure(s) (42706)   SURGEON: Georgeanna Harrison, M.D.  ASSISTANT(S): None  ANESTHESIA: General  FINDINGS: Preoperative Examination: LLE:  Inspection: Hip held in external rotation Palpation: Tender at hip over site of known intertrochanteric fracture.  Also tender over knee. Strength: Intact dorsiflexion and plantarflexion through limited range of motion against manual resistance.  No EHL function.  Apparently baseline. Skin: Chronic cellulitis of leg.  Chronic venous stasis changes to leg. Peripheral Vascular: 2+ pitting edema.  DP pulse marked but not palpable. Reflexes: No pathologic Sensation: No sensation to light touch distally in the superficial peroneal, deep peroneal, or tibial distributions, apparently baseline.  Sensation intact to light touch just below the knee.  Operative Findings: Fluoroscopic examination and redemonstrated comminuted left peritrochanteric proximal femur fracture with subtrochanteric extension.  Appropriate reduction obtained on Hana table with traction and ligamentotaxis, resulting in restoration of length, alignment, and neck-shaft angle.  Appropriate placement of cephalomedullary nail components including 2 distal interlock screws and maintenance of reduction, confirmed on orthogonal AP and lateral fluoroscopic views at the conclusion of the case.  IMPLANTS: Implant Name Type Inv. Item  Serial No. Manufacturer Lot No. LRB No. Used Action  NAIL 10/130 TI CAN TFNA 360HIP - [id number] Nail NAIL 10/130 TI CAN TFNA 360HIP  [employer] [id number] Left 1 Implanted  SCREW LAG TFNA 90 HIP - [id number] Screw SCREW LAG TFNA 90 HIP  [employer]  Left 1 Implanted  SCREW LOCK IM NAIL 5X46 - [id number] Screw SCREW LOCK IM NAIL 5X46  [employer]  Left 1 Implanted  SCREW LOCK IM NAIL 5X50 - [id number] Screw SCREW LOCK IM NAIL 5X50  [employer]  Left 1 Implanted    INDICATIONS:  The patient is a 81 y.o. male who sustained a mechanical fall, and presented to the emergency room.  Imaging was obtained which revealed a comminuted left peritrochanteric fracture.  Due to his multiple active medical comorbidities and medical complexity, he was transferred from outside hospital to [facility] for definitive treatment.  Prior to the injury he was ambulatory with the use of a rolling walker.  Given the morbidity associated with nonoperative treatment this injury, he was interested in pursuing surgical treatment in order to restore his previous level of mobility and function.  He was therefore recommended for cephalomedullary nail fixation of the fracture.Marland Kitchen  He understood the risks, benefits and alternatives to surgery which include but are not limited to bleeding, wound healing complications, infection, damage to surrounding structures, persistent pain, stiffness, lack of improvement, potential for subsequent arthritis or worsening of pre-existing arthritis, nonunion, malunion, and need for further surgery, as well as complications related to anesthesia, cardiovascular complications, and death.  He also understood the potential for continued pain, and that there were no guarantees of acceptable outcome.  After weighing these risks the patient opted to proceed with surgery.  TECHNIQUE: Patient was identified in the preoperative holding area.  The left hip was marked by myself.  Consent was signed by  myself and the patient.   FI block was performed by anesthesia in the preoperative holding area.  Patient was taken to the operative suite and placed supine on the operative table.  Anesthesia was induced by the anesthesia team.  The patient was positioned appropriately for the procedure and all bony prominences were well padded.  A tourniquet was not used.  Preoperative antibiotics were given. The extremity was prepped and draped in the usual sterile fashion and surgical timeout was performed.  Closed reduction was performed on the Hana table using traction and ligamentotaxis, resulting in restoration of length, alignment, and neck-shaft angle.  Reduction was confirmed on orthogonal AP and lateral fluoroscopic views.  Due to the patient's body habitus, an incision more proximal but in line with the tip of the greater trochanter was marked out on the skin.  Skin was incised sharply.  Multiple layers of fat were dissected with Bovie electrocautery down onto the fascial layer.  Fascia was incised sharply.  The underlying muscle tissue was divided bluntly in line with fibers.  The starting wire was positioned at the trochanteric starting point of the greater trochanter, and advanced into the proximal intramedullary canal of the femur.  Appropriate starting wire position was confirmed orthogonal AP and lateral fluoroscopic views.  The proximal femur was cannulated with the entry reamer, the entry reamer and wire were withdrawn.  A ball-tipped guidewire was impacted down the femur distally toward the knee and a center center position, confirming appropriate position orthogonal AP and lateral fluoroscopic views.  Sequential reaming was then carried out up to a diameter of 11.5 mm, resulting in good cortical chatter at the isthmus, with appropriate fill confirmed fluoroscopically, in order to accommodate a 10.0 mm nail.  Nail was impacted into position over the guidewire.  Entry point for head screw was noted laterally on  the skin.  Skin was incised sharply in this area, followed by sharp dissection on the lateral femoral cortex through the IT band.  The triple sleeve trocar for the head screw was advanced in position of the lateral femoral cortex.  Guidewire for the head screw was advanced up through the femoral neck into the femoral head to the subchondral bone, taking care to avoid subchondral penetration.  Measurement for the head screw was obtained off this wire.  At this point an antirotation wire was placed through the jig, in order to avoid the rotating the head fragment with screw placement.  Path for the head screw was drilled, and a 90 mm head screw was advanced into position over the guidewire.  The derotation wire was withdrawn.  In order to achieve a better reduction of the head neck fragment to the shaft of the lateral wall fragments, the screw was compressed through the construct using the jig.  This did result in improved reduction of the head neck fragment to the shaft and lateral wall fragments, but did result in some mild displacement at the lateral femoral cortex.  Consideration was given to removing compression, creating an accessory wound in order to achieve a possible further reduction and cabling around the area of the step-off; however, given the patient's extremely poor nutritional status and medical comorbidities, as well as concern for potential loss of reduction and worse with subsequent reduction with this maneuver, it was decided to keep the surgical exposure as minimal as possible.  Furthermore, the overall alignment and restoration of neck-shaft angle and alignment parameters was felt to be appropriate.  The proximal construct was locked and the jig was removed.  2 distal interlock screws were placed using perfect circles technique through a  separate incision.  All wounds were copiously irrigated, and hemostasis was obtained.  Total of 1 g of vancomycin powder was placed deep and through all layers  of all wounds.  Fascial layers were closed with interrupted #1 PDS figure-of-eight sutures.  Multiple layers of fat were closed with simple inverted interrupted #0 Vicryl.  Deep dermal layer was closed with simple inverted interrupted #2-0 Monocryl, followed by running #3-0 Monocryl subcuticular.  Tails were secured with Steri-Strips.  Given the patient's body habitus, morbid obesity, and poor nutritional status, an incisional VAC was placed over the wounds to provide negative pressure wound therapy in order to promote healing.  Plastic dressings were placed in between the wounds, and the VAC was placed continuously over top of the wounds over the plastic bridges.  This was connected to a VAC machine set to [place] setting.  Patient was awakened from anesthesia and transferred to PACU in stable condition.  He tolerated procedure well.  There were no complications.    2 units packed red blood cells were transfused during the procedure, as the patient came to the OR with a hemoglobin of 7.  POST OPERATIVE INSTRUCTIONS: Mobility: Out of bed with PT/OT Pain control: Continue to wean/titrate to appropriate oral regimen DVT Prophylaxis: Lovenox 30 mg daily, or alternate per primary medical team Further surgical plans: None RUE: No restrictions, weightbearing as tolerated LUE: No restrictions, weightbearing as tolerated RLE: No restrictions, weightbearing as tolerated LLE: No restrictions, weightbearing as tolerated Disposition: Per primary team, as medically appropriate Dressing care: The PREVENA incisional VAC (iVAC) should be left on for 7 days and then removed.  After that the wounds may be dressed with dry sterile gauze dressing and Tegaderm (clear adhesive plastic squares).  If the PREVENA hose is connected to a large VAC machine in the hospital, this should be switched to the small portable PREVENA unit prior to discharge.  Please make sure a responsible party understands how to operate the [facility]  unit before discharge, including how to charge the unit.  Please note that the [facility] dressing will ONLY work if connected to suction!  If the [facility] unit fails and cannot be fixed/restored to normal operation to restore suction, the sponge and plastic dressing should be removed and replaced with gauze/Tegaderm.  If the dressing loses suction due to a leak attempt to seal the leak with Tegaderm or remove the sponge plastic dressing replace with gauze/Tegaderm. Follow-up: Please call [facility] [phone]) to schedule follow-op appointment for 2 weeks after surgery.  TOURNIQUET TIME: * No tourniquets in log *  BLOOD LOSS: 1000 mL         DRAINS: none         SPECIMEN: none       COMPLICATIONS:  * No complications entered in OR log *         DISPOSITION: PACU - hemodynamically stable.         CONDITION: stable   Georgeanna Harrison M.D. Orthopaedic Surgery [facility]   Portions of the record have been created with voice recognition software.  Grammatical and punctuation errors, random word insertions, wrong-word or "sound-a-like" substitutions, pronoun errors (inaccuracies and/or substitutions), and/or incomplete sentences may have occurred due to the inherent limitations of voice recognition software.  Not all errors are caught or corrected.  Although every attempt is made to root out erroneous and incomplete transcription, the note may still not fully represent the intent or opinion of the author.  Read the chart carefully and recognize, using context, where errors/substitutions have occurred.  Any questions or concerns about the content of this note or information contained within the body of this dictation should be addressed directly with the author for clarification.

## 2021-07-19 NOTE — Hospital Course (Addendum)
Hospital course  Gary Nelson is a 81 y.o. 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  In the ED, patient was on 2 L of nasal cannula oxygen blood pressure was stable.  Initial hemoglobin of 7.9, Creatinine was 1.4.  Chest x-ray showed mild right lung base opacity suspected to be effusion versus atelectasis.  X-ray of the left humerus showed a nondisplaced intertrochanteric fracture of the proximal left femur.   CT left hip with acute comminuted fracture proximal left femur, 4.6 cm x 2.4 cm x 8.1 cm chronic appearing collection perimuscular fluid within the soft tissues of the left lateral pelvic wall.  Apparently, he was also noted to have sinus pauses x2 lasting 1 second noted at [facility].  Patient was evaluated by the anesthesiologist at [facility] with recommendation of transfer to [facility] for surgical invention and cardiac evaluation.    Following conditions were addressed during hospitalization,  Left proximal femur comminuted fracture Status post IM nailing on the left on 07/19/2021. PT recommends skilled nursing facility placement at this time.  As per orthopedics weightbearing as tolerated.  DVT prophylaxis 30 mg of Lovenox daily.  Orthopedics planning to follow-up in 2 weeks.  Acute blood loss anemia on chronic anemia.  Received 1 unit of packed RBC during hospitalization.  Hemoglobin of 7.3 today..  Sinus pause Lasting for 1 second x 2.  Cardiology was consulted.   2D echocardiogram showed LV ejection fraction of 60 to 65%, no regional wall motion abnormality.  Amlodipine has been  discontinued for  Bilateral lower extremity cellulitis with left anterior shin ulceration Received IV Rocephin for 5 days.  We will continue with Omnicef for next 2 days.  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  Seen by wound care and will need wound care dressing on discharge.  Dysuria/increased urinary frequency, abnormal urinalysis likely E. coli Received IV Rocephin during hospitalization.  Urine culture sensitive for Rocephin.  Will change to [facility] on discharge to complete 7-day course.  Hyperkalemia.  Improved.  Potassium was 4.9 prior to discharge.  Will be resumed on ARB from home   Chronic hypoxic respiratory failure with underlying COPD. COPD 2 L of oxygen by nasal cannula at baseline.  No active issues.  Essential hypertension Off amlodipine.  Resume telmisartan  Chronic diastolic congestive heart failure, compensated Chest x-ray with no pulmonary edema.  TTE 09/11/2012 with LVEF 60%.  Continue oral Lasix on discharge.  Hyperlipidemia Continue Lipitor.    CKD stage IIIb Baseline creatinine 1.2-1.4.  Follows with nephrology outpatient, Dr. Theador Hawthorne.  Latest creatinine at 1.5  BPH:  Continue Flomax 0.4 mg p.o. daily   Type 2 diabetes mellitus Hemoglobin A1c  of 5.4, well controlled.  Home regimen includes NPH 5 units BIDAC.  Resume home insulin regimen on discharge  Peripheral neuropathy:  Continue Neurontin.   EtOH use disorder No report of alcohol usage in the last 6 months.  Patient was on CIWA protocol with symptom triggered Ativan but did not have withdrawal symptoms.  Continue Multivitamin, thiamine, folic acid on discharge.   Umbilical hernia: Reducible.   OSA: Continue nocturnal  CPAP   History of stage IV colon cancer with metastasis to lungs Follows with medical oncology outpatient, Dr. Delton Coombes as outpatient.  Seen in clinic 06/27/2021.  Poorly differentiated adenocarcinoma.  Currently on Bevacizumab + Trifluridine/Tipiracil  q28d.  Plan is to follow-up with medical oncology as scheduled as outpatient   Morbid obesity Body mass index is 45.35 kg/m.  Would benefit from ongoing weight loss as outpatient.

## 2021-07-19 NOTE — H&P (Signed)
PREOPERATIVE H&P  HPI: Gary Nelson is a 81 y.o. male who has presented today for surgery, with the diagnosis of left intertrochanteric fracture after a mechanical fall.  He has numerous medical comorbidities and was found to be medically and appropriate for surgery at outside hospital, and was thus transferred here.  Of note, he does have chronic left lower extremity cellulitis and bilateral venous stasis changes, among other significant high risk medical comorbidities.  The various methods of treatment have been discussed with the patient and family.  After consideration of risks, benefits, and other options for treatment, the patient has consented to INTRAMEDULLARY (IM) NAIL INTERTROCHANTRIC as a surgical intervention.  The patient's history has been reviewed, patient examined, no change in status, stable for surgery.  I have reviewed the patient's chart and labs.  Questions were answered to the patient's satisfaction.    PMH: Past Medical History:  Diagnosis Date   Adenocarcinoma of colon ([place])    RLL -  Pulmonary nodule (06/2015, concerning for mets vs primary; to see [facility] Dr. 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   Current Outpatient Medications on File Prior to Encounter  Medication Sig Dispense Refill   amLODipine (NORVASC) 5 MG tablet TAKE ONE TABLET ONCE DAILY (Patient taking  differently: Take 5 mg by mouth daily.) 30 tablet 1   aspirin EC 81 MG tablet Take 81 mg by mouth daily.     atorvastatin (LIPITOR) 10 MG tablet Take 10 mg by mouth daily at 6 PM.      Cholecalciferol 25 MCG (1000 UT) capsule Take 1,000 Units by mouth daily.     [employer] B-12 5000 MCG SUBL Place 1 tablet (5,000 mcg total) under the tongue daily. 30 tablet 6   diphenoxylate-atropine (LOMOTIL) 2.5-0.025 MG tablet Take 1 tablet by mouth 4 (four) times daily as needed for diarrhea or loose stools. 60 tablet 0   furosemide (LASIX) 20 MG tablet Take 20 mg by mouth 2 (two) times daily.     gabapentin (NEURONTIN) 300 MG capsule Take 2 capsules (600 mg total) by mouth 3 (three) times daily. (Patient taking differently: Take 600 mg by mouth 2 (two) times daily.) 180 capsule 2   insulin NPH (HUMULIN N,NOVOLIN N) 100 UNIT/ML injection Inject 10 Units into the skin 2 (two) times daily before a meal. Per pt, 5 units every morning and 5 units in the evening     silver sulfADIAZINE (SILVADENE) 1 % cream Apply 1 application topically as needed.     tamsulosin (FLOMAX) 0.4 MG CAPS capsule Take 1 capsule by mouth daily.     telmisartan (MICARDIS) 20 MG tablet Take 20 mg by mouth daily.     ketoconazole (NIZORAL) 2 % cream  Apply topically as needed. (Patient not taking: Reported on 07/17/2021)     lidocaine-prilocaine (EMLA) cream Apply to affected area once (Patient not taking: Reported on 07/17/2021) 30 g 3   naproxen (NAPROSYN) 500 MG tablet Take by mouth. (Patient not taking: Reported on 07/17/2021)     nitrofurantoin, macrocrystal-monohydrate, (MACROBID) 100 MG capsule Take 1 capsule (100 mg total) by mouth 2 (two) times daily. (Patient not taking: Reported on 07/17/2021) 14 capsule 0   OXYGEN Inhale 2 L into the lungs continuous. At night time only     phenylephrine-shark liver oil-mineral oil-petrolatum (PREPARATION H) 0.25-14-74.9 % rectal ointment Place 1 application rectally as needed for hemorrhoids. (Patient not  taking: Reported on 07/17/2021)     prochlorperazine (COMPAZINE) 10 MG tablet Take 1 tablet (10 mg total) by mouth every 6 (six) hours as needed (Nausea or vomiting). (Patient not taking: Reported on 07/17/2021) 30 tablet 1   sucralfate (CARAFATE) 1 GM/10ML suspension SWISH AND SWALLOW 15ML FOUR TIMES DAILY (Patient not taking: Reported on 07/17/2021)     trifluridine-tipiracil (LONSURF) 20-8.19 MG tablet Take 3 pills twice a day 1 hr after AM & PM meals on days 1-5, 8-12. Repeat every 28 days (Patient not taking: Reported on 07/17/2021) 60 tablet 0   No Known Allergies   PSH: Past Surgical History:  Procedure Laterality Date   BACK SURGERY     CATARACT EXTRACTION     left   CATARACT EXTRACTION W/PHACO Right 04/11/2012   Procedure: CATARACT EXTRACTION PHACO AND INTRAOCULAR LENS PLACEMENT (IOC);  Surgeon: Tonny Branch, MD;  Location: [facility];  Service: Ophthalmology;  Laterality: Right;  CDE:62.48   CERVICAL FUSION     c4-c5   CHOLECYSTECTOMY     COLON SURGERY  09/29/12   Colon resection for cancer   COLONOSCOPY     EYE SURGERY     cataract   GIVENS CAPSULE STUDY N/A 03/15/2015   Procedure: GIVENS CAPSULE STUDY;  Surgeon: Rogene Houston, MD;  Location: [facility];  Service: Endoscopy;  Laterality: N/A;  730   LEG SURGERY     ORIF FEMUR FRACTURE     right   POSTERIOR CERVICAL FUSION/FORAMINOTOMY N/A 07/27/2015   Procedure: Cervical four to Cervical seven Laminectomy with Cervical four to Thoracic one Dorsal internal fixation and fusion;  Surgeon: Kevan Ny Ditty, MD;  Location: [facility];  Service: Neurosurgery;  Laterality: N/A;  C4 to C7 Laminectomy with C4 to T1 Dorsal internal fixation and fusion     Family History Social History  Family History  Problem Relation Age of Onset   Heart disease Father    Heart attack Father    Alzheimer's disease Mother     Social History   Socioeconomic History   Marital status: Single    Spouse name: Not on file   Number of children: Not on  file   Years of education: Not on file   Highest education level: Not on file  Occupational History   Not on file  Tobacco Use   Smoking status: Former    Packs/day: 1.00    Years: 55.00    Total pack years: 55.00    Types: Cigarettes    Start date: 02/07/1955   Smokeless tobacco: Never   Tobacco comments:    quit 2015  Vaping Use   Vaping Use: Never used  Substance and Sexual Activity   Alcohol use: Not Currently    Alcohol/week: 3.0 standard drinks of alcohol    Types: 3  Cans of beer per week   Drug use: No   Sexual activity: Yes    Birth control/protection: None  Other Topics Concern   Not on file  Social History Narrative   Not on file   Social Determinants of Health   Financial Resource Strain: Not on file  Food Insecurity: Not on file  Transportation Needs: Not on file  Physical Activity: Not on file  Stress: Not on file  Social Connections: Not on file     Review of Systems: MSK: As noted per HPI above GI: No current Nausea/vomiting ENT: Denies sore throat, epistaxis CV: Denies chest pain Resp: No current shortness of breath  Other than mentioned above, there are no Constitutional, Neurological, Psychiatric, ENT, Ophthalmological, Cardiovascular, Respiratory, GI, GU, Musculoskeletal, Integumentary, Lymphatic, Endocrine or Allergic issues.   Physical Examination: CV: Normal distal pulses Lungs: Unlabored respirations LLE:  Inspection: Hip held in external rotation Palpation: Tender at hip over site of known intertrochanteric fracture.  Also tender over knee. Strength: Intact dorsiflexion and plantarflexion through limited range of motion against manual resistance.  No EHL function.  Apparently baseline. Skin: Chronic cellulitis of leg.  Chronic venous stasis changes to leg. Peripheral Vascular: 2+ pitting edema.  DP pulse marked but not palpable. Reflexes: No pathologic Sensation: No sensation to light touch distally in the superficial peroneal, deep  peroneal, or tibial distributions, apparently baseline.  Sensation intact to light touch just below the knee.  Assessment/Plan: INTRAMEDULLARY (IM) NAIL INTERTROCHANTRIC    Georgeanna Harrison M.D. Orthopaedic Surgery [facility]  Review of this patient's medications prescribed by other providers does not in any way constitute an endorsement by this clinician of their use, indications, dosage, route, efficacy, interactions, or other clinical parameters.  Portions of the record have been created with voice recognition software.  Grammatical and punctuation errors, random word insertions, wrong-word or "sound-a-like" substitutions, pronoun errors (inaccuracies and/or substitutions), and/or incomplete sentences may have occurred due to the inherent limitations of voice recognition software.  Not all errors are caught or corrected.  Although every attempt is made to root out erroneous and incomplete transcription, the note may still not fully represent the intent or opinion of the author.  Read the chart carefully and recognize, using context, where errors/substitutions have occurred.  Any questions or concerns about the content of this note or information contained within the body of this dictation should be addressed directly with the author for clarification.

## 2021-07-20 ENCOUNTER — Inpatient Hospital Stay (HOSPITAL_COMMUNITY): Payer: Medicare Other

## 2021-07-20 ENCOUNTER — Encounter (HOSPITAL_COMMUNITY): Payer: Self-pay | Admitting: Orthopedic Surgery

## 2021-07-20 DIAGNOSIS — I5032 Chronic diastolic (congestive) heart failure: Secondary | ICD-10-CM | POA: Diagnosis not present

## 2021-07-20 DIAGNOSIS — S72142A Displaced intertrochanteric fracture of left femur, initial encounter for closed fracture: Secondary | ICD-10-CM | POA: Diagnosis not present

## 2021-07-20 DIAGNOSIS — I10 Essential (primary) hypertension: Secondary | ICD-10-CM | POA: Diagnosis not present

## 2021-07-20 DIAGNOSIS — Z01818 Encounter for other preprocedural examination: Secondary | ICD-10-CM | POA: Diagnosis not present

## 2021-07-20 DIAGNOSIS — I38 Endocarditis, valve unspecified: Secondary | ICD-10-CM | POA: Diagnosis not present

## 2021-07-20 DIAGNOSIS — N400 Enlarged prostate without lower urinary tract symptoms: Secondary | ICD-10-CM | POA: Diagnosis not present

## 2021-07-20 DIAGNOSIS — R8290 Unspecified abnormal findings in urine: Secondary | ICD-10-CM | POA: Diagnosis not present

## 2021-07-20 LAB — POCT I-STAT, CHEM 8
BUN: 28 mg/dL — ABNORMAL HIGH (ref 8–23)
Calcium, Ion: 1.4 mmol/L (ref 1.15–1.40)
Chloride: 110 mmol/L (ref 98–111)
Creatinine, Ser: 1.6 mg/dL — ABNORMAL HIGH (ref 0.61–1.24)
Glucose, Bld: 108 mg/dL — ABNORMAL HIGH (ref 70–99)
HCT: 24 % — ABNORMAL LOW (ref 39.0–52.0)
Hemoglobin: 8.2 g/dL — ABNORMAL LOW (ref 13.0–17.0)
Potassium: 5.3 mmol/L — ABNORMAL HIGH (ref 3.5–5.1)
Sodium: 139 mmol/L (ref 135–145)
TCO2: 21 mmol/L — ABNORMAL LOW (ref 22–32)

## 2021-07-20 LAB — ECHOCARDIOGRAM COMPLETE
AR max vel: 3.57 cm2
AV Area VTI: 3.94 cm2
AV Area mean vel: 3.51 cm2
AV Mean grad: 5 mmHg
AV Peak grad: 9.2 mmHg
Ao pk vel: 1.52 m/s
Area-P 1/2: 13.08 cm2
Height: 61 in
S' Lateral: 2.8 cm
Single Plane A4C EF: 63.3 %
Weight: 3992.97 oz

## 2021-07-20 LAB — URINE CULTURE: Culture: >=100000 — AB

## 2021-07-20 LAB — CBC
HCT: 20.7 % — ABNORMAL LOW (ref 39.0–52.0)
Hemoglobin: 7.2 g/dL — ABNORMAL LOW (ref 13.0–17.0)
MCH: 35.5 pg — ABNORMAL HIGH (ref 26.0–34.0)
MCHC: 34.8 g/dL (ref 30.0–36.0)
MCV: 102 fL — ABNORMAL HIGH (ref 80.0–100.0)
Platelets: 216 10*3/uL (ref 150–400)
RBC: 2.03 MIL/uL — ABNORMAL LOW (ref 4.22–5.81)
RDW: 17.2 % — ABNORMAL HIGH (ref 11.5–15.5)
WBC: 15.4 10*3/uL — ABNORMAL HIGH (ref 4.0–10.5)
nRBC: 0.1 % (ref 0.0–0.2)

## 2021-07-20 LAB — BASIC METABOLIC PANEL
Anion gap: 5 (ref 5–15)
BUN: 33 mg/dL — ABNORMAL HIGH (ref 8–23)
CO2: 24 mmol/L (ref 22–32)
Calcium: 8.2 mg/dL — ABNORMAL LOW (ref 8.9–10.3)
Chloride: 106 mmol/L (ref 98–111)
Creatinine, Ser: 1.73 mg/dL — ABNORMAL HIGH (ref 0.61–1.24)
GFR, Estimated: 40 mL/min — ABNORMAL LOW (ref >60)
Glucose, Bld: 146 mg/dL — ABNORMAL HIGH (ref 70–99)
Potassium: 5.6 mmol/L — ABNORMAL HIGH (ref 3.5–5.1)
Sodium: 135 mmol/L (ref 135–145)

## 2021-07-20 LAB — GLUCOSE, CAPILLARY
Glucose-Capillary: 116 mg/dL — ABNORMAL HIGH (ref 70–99)
Glucose-Capillary: 122 mg/dL — ABNORMAL HIGH (ref 70–99)
Glucose-Capillary: 142 mg/dL — ABNORMAL HIGH (ref 70–99)
Glucose-Capillary: 143 mg/dL — ABNORMAL HIGH (ref 70–99)
Glucose-Capillary: 178 mg/dL — ABNORMAL HIGH (ref 70–99)
Glucose-Capillary: 179 mg/dL — ABNORMAL HIGH (ref 70–99)

## 2021-07-20 MED ORDER — ENOXAPARIN SODIUM 40 MG/0.4ML IJ SOSY
40.0000 mg | PREFILLED_SYRINGE | INTRAMUSCULAR | Status: DC
  Administered 2021-07-21 – 2021-07-22 (×2): 40 mg via SUBCUTANEOUS
  Filled 2021-07-20 (×2): qty 0.4

## 2021-07-20 MED ORDER — HYDROCODONE-ACETAMINOPHEN 5-325 MG PO TABS: 1.0000 | ORAL_TABLET | Freq: Four times a day (QID) | ORAL | 0 refills | Status: AC | PRN

## 2021-07-20 MED ORDER — ENOXAPARIN SODIUM 30 MG/0.3ML IJ SOSY
30.0000 mg | PREFILLED_SYRINGE | INTRAMUSCULAR | 0 refills | Status: AC
Start: 2021-07-21 — End: 2021-08-31

## 2021-07-20 NOTE — Progress Notes (Signed)
Orthopaedics Daily Progress Note   07/20/2021   12:22 PM  Gary Nelson is a 81 y.o. male 1 Day Post-Op s/p INTRAMEDULLARY (IM) NAIL INTERTROCHANTRIC, APPLICATION OF WOUND VAC  Subjective Pain appropriately controlled.  Denies nausea, vomiting, or fevers. Worked with PT.  Objective Vitals:   07/20/21 0415 07/20/21 0741  BP: (!) 142/59 (!) 150/60  Pulse: 100 (!) 102  Resp: 20   Temp: 98 F (36.7 C) 98.2 F (36.8 C)  SpO2: 100% 99%   No intake or output data in the 24 hours ending 07/20/21 1222  Physical Exam LLE: iVAC intact and holding suction Limited but intact DF/PF, no EHL, all unchanged from preop exam No SLT distally (baseline) WWP distally but DP pulse not palpable (stable from preop exam)   Assessment 81 y.o. male s/p Procedure(s) (LRB): INTRAMEDULLARY (IM) NAIL INTERTROCHANTRIC (Left) APPLICATION OF WOUND VAC (Left)  Plan Mobility: Out of bed with PT/OT Pain control: Continue to wean/titrate to appropriate oral regimen DVT Prophylaxis: Lovenox 30 mg daily, or alternate per primary medical team Further surgical plans: None RUE: No restrictions, weightbearing as tolerated LUE: No restrictions, weightbearing as tolerated RLE: No restrictions, weightbearing as tolerated LLE: No restrictions, weightbearing as tolerated Disposition: Per primary team, as medically appropriate Dressing care: The PREVENA incisional VAC (iVAC) should be left on for 7 days and then removed.  After that the wounds may be dressed with dry sterile gauze dressing and Tegaderm (clear adhesive plastic squares).  If the PREVENA hose is connected to a large VAC machine in the hospital, this should be switched to the small portable PREVENA unit prior to discharge.  Please make sure a responsible party understands how to operate the [facility] unit before discharge, including how to charge the unit.  Please note that the [facility] dressing will ONLY work if connected to suction!  If the [facility] unit fails  and cannot be fixed/restored to normal operation to restore suction, the sponge and plastic dressing should be removed and replaced with gauze/Tegaderm.  If the dressing loses suction due to a leak attempt to seal the leak with Tegaderm or remove the sponge plastic dressing replace with gauze/Tegaderm. Left leg cellulitis: antibiotics, wound care, and dressing changes per Wound Care/primary team Chronic pre-existing issues unrelated to current hip fracture Consider Wound Care consult for formal recs prior to discharge Please make appropriate plans for ongoing treatment of chronic cellulitis, venous stasis, and wound care after discharge--will not be managed/treated by my me as this is outside of the scope of my practice Remainder of care per primary team Follow-up: Please call [facility] [phone]) to schedule follow-up appointment for 2 weeks after surgery.  I have verified that my discharge instructions and follow-up information have been entered in the Discharge Navigator in Epic.  These should automatically populate in the AVS.  Please print the AVS in its entirety and ensure that the patient or a responsible party has a complete copy of the AVS before they are discharged.  If there are questions regarding discharge instructions or follow-up before the AVS is generated, please check the Discharge Navigator before attempting to contact the surgeon/office.  If unsure how to access the Discharge Navigator or the information contained in the Discharge Navigator, or how to generate/print the AVS, please contact the appropriate Nurse, learning disability.   I have printed the following prescriptions and placed them in the patient's hard chart: Lovenox and Norco    Georgeanna Harrison M.D. Orthopaedic Surgery [facility]

## 2021-07-20 NOTE — NC FL2 (Signed)
[place] LEVEL OF CARE SCREENING TOOL     IDENTIFICATION  Patient Name: Gary Nelson Birthdate: [birth year] Sex: male Admission Date (Current Location): 07/17/2021  [facility] and [place] Number:  Herbalist and Address:  [facility], [place] [address] [place], [place] [address]      Provider Number: [id number]  Attending Physician Name and Address:  Flora Lipps, MD  Relative Name and Phone Number:  [place], [place] [phone]    Current Level of Care: Hospital Recommended Level of Care: [place] Prior Approval Number:    Date Approved/Denied:   PASRR Number: [id number]  Discharge Plan: SNF    Current Diagnoses: Patient Active Problem List   Diagnosis Date Noted   Stage 3b chronic kidney disease (CKD) ([place]) 07/18/2021   HLD (hyperlipidemia) 07/18/2021   BPH (benign prostatic hyperplasia) 07/18/2021   Chronic respiratory failure with hypoxia ([place]) 07/18/2021   Abnormal urine 07/18/2021   Closed comminuted intertrochanteric fracture of left femur ([place]) 07/17/2021   Benign essential hypertension 08/24/2020   Colon cancer ([place]) 08/24/2020   Personal history of colon cancer, stage II 08/24/2020   Primary localized osteoarthritis of both hips 08/24/2020   Type 2 diabetes mellitus without complication, with long-term current use of insulin ([place]) 08/24/2020   Hyperkalemia 08/18/2020   Cellulitis of left upper extremity 06/08/2020   Iron deficiency 07/31/2019   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/28/2019   Portal hypertension ([place]) 07/28/2019   Risk for falls 12/23/2017   History of DVT in adulthood 03/26/2017   Malignant neoplasm metastatic to lung ([place]) 10/20/2015   Adenocarcinoma of colon ([place]) 07/15/2015   Sepsis ([place]) 03/19/2013   CHF, chronic (H[facility]) 03/19/2013   Diarrhea 02/19/2013   Dvt femoral (deep venous thrombosis) (H[facility]) 02/19/2013   Venous insufficiency of both lower extremities 09/09/2012    Diabetes mellitus ([place]) 09/09/2012   Morbid obesity ([place]) 09/09/2012   Sleep apnea 09/09/2012   CKD (chronic kidney disease) 09/09/2012   S/P cervical spinal fusion 09/27/2011   Cervical myelopathy (HCC) 06/14/2011    Orientation RESPIRATION BLADDER Height & Weight     Self, Place  O2 Incontinent Weight: 249 lb 9 oz (113.2 kg) Height:  5\' 1"  (154.9 cm)  BEHAVIORAL SYMPTOMS/MOOD NEUROLOGICAL BOWEL NUTRITION STATUS      Continent Diet (see discharge summary)  AMBULATORY STATUS COMMUNICATION OF NEEDS Skin   Extensive Assist Verbally Surgical wounds                       Personal Care Assistance Level of Assistance  Bathing, Feeding, Dressing Bathing Assistance: Maximum assistance Feeding assistance: Limited assistance Dressing Assistance: Maximum assistance     Functional Limitations Info  Sight, Hearing, Speech Sight Info: Adequate Hearing Info: Adequate Speech Info: Adequate    SPECIAL CARE FACTORS FREQUENCY  PT (By licensed PT), OT (By licensed OT)     PT Frequency: 5x week OT Frequency: 5x week            Contractures Contractures Info: Not present    Additional Factors Info  Code Status, Allergies, Insulin Sliding Scale Code Status Info: full Allergies Info: NKA   Insulin Sliding Scale Info: Novolog, 0-9 units, Q4 hours  See discharg summary.       Current Medications (07/20/2021):  This is the current hospital active medication list Current Facility-Administered Medications  Medication Dose Route Frequency Provider Last Rate Last Admin   acetaminophen (TYLENOL) tablet 325-650 mg  325-650 mg  Oral Q6H PRN Georgeanna Harrison, MD       acetaminophen (TYLENOL) tablet 500 mg  500 mg Oral Q6H Georgeanna Harrison, MD   500 mg at 07/20/21 0834   albuterol (PROVENTIL) (2.5 MG/3ML) 0.083% nebulizer solution 2.5 mg  2.5 mg Nebulization Q2H PRN [place] Eric J, DO       aspirin EC tablet 81 mg  81 mg Oral Daily Emokpae, Ejiroghene E, MD   81 mg at 07/20/21 1133    atorvastatin (LIPITOR) tablet 10 mg  10 mg Oral q1800 Emokpae, Ejiroghene E, MD   10 mg at 07/19/21 1728   cefTRIAXone (ROCEPHIN) 2 g in sodium chloride 0.9 % 100 mL IVPB  2 g Intravenous Q24H [place] Donnamarie Poag, DO 200 mL/hr at 07/19/21 1506 2 g at 07/19/21 1506   docusate sodium (COLACE) capsule 100 mg  100 mg Oral BID Georgeanna Harrison, MD   100 mg at 07/20/21 1133   enoxaparin (LOVENOX) injection 30 mg  30 mg Subcutaneous Q24H Georgeanna Harrison, MD   30 mg at 25/36/64 4034   folic acid (FOLVITE) tablet 1 mg  1 mg Oral Daily [place] Donnamarie Poag, DO   1 mg at 07/20/21 1133   furosemide (LASIX) tablet 20 mg  20 mg Oral BID Emokpae, Ejiroghene E, MD   20 mg at 07/20/21 7425   gabapentin (NEURONTIN) capsule 600 mg  600 mg Oral BID Emokpae, Ejiroghene E, MD   600 mg at 07/20/21 1133   HYDROcodone-acetaminophen (NORCO) 7.5-325 MG per tablet 1-2 tablet  1-2 tablet Oral Q4H PRN Georgeanna Harrison, MD       HYDROcodone-acetaminophen (NORCO/VICODIN) 5-325 MG per tablet 1-2 tablet  1-2 tablet Oral Q4H PRN Georgeanna Harrison, MD       HYDROmorphone (DILAUDID) injection 0.5 mg  0.5 mg Intravenous Q4H PRN [place] Eric J, DO       insulin aspart (novoLOG) injection 0-9 Units  0-9 Units Subcutaneous Q4H Emokpae, Ejiroghene E, MD   1 Units at 07/20/21 0428   irbesartan (AVAPRO) tablet 75 mg  75 mg Oral Daily Emokpae, Ejiroghene E, MD   75 mg at 07/20/21 1133   LORazepam (ATIVAN) tablet 1-4 mg  1-4 mg Oral Q1H PRN [place] Eric J, DO       Or   LORazepam (ATIVAN) injection 1-4 mg  1-4 mg Intravenous Q1H PRN [place] Eric J, DO       menthol-cetylpyridinium (CEPACOL) lozenge 3 mg  1 lozenge Oral PRN Georgeanna Harrison, MD       Or   phenol (CHLORASEPTIC) mouth spray 1 spray  1 spray Mouth/Throat PRN Georgeanna Harrison, MD       methocarbamol (ROBAXIN) tablet 500 mg  500 mg Oral Q6H PRN [place] Eric J, DO       multivitamin with minerals tablet 1 tablet  1 tablet Oral Daily [place] Donnamarie Poag, DO   1 tablet at 07/20/21 1133   ondansetron  (ZOFRAN) tablet 4 mg  4 mg Oral Q6H PRN Georgeanna Harrison, MD       Or   ondansetron [facility]) injection 4 mg  4 mg Intravenous Q6H PRN Georgeanna Harrison, MD       tamsulosin [facility]) capsule 0.4 mg  0.4 mg Oral Daily Emokpae, Ejiroghene E, MD   0.4 mg at 07/20/21 1133   thiamine tablet 100 mg  100 mg Oral Daily [place] Donnamarie Poag, DO   100 mg at 07/20/21 1133   Or   thiamine (B-1) injection 100 mg  100 mg  Intravenous Daily [place] Donnamarie Poag, DO       Zinc Oxide (TRIPLE PASTE) 12.8 % ointment   Topical BID [place] Eric J, DO   Given at 07/20/21 1134     Discharge Medications: Please see discharge summary for a list of discharge medications.  Relevant Imaging Results:  Relevant Lab Results:   Additional Information ss# [id number]. Pt is vaccinated for covid with multiple boosters.  Joanne Chars, LCSW

## 2021-07-20 NOTE — Progress Notes (Signed)
Progress Note  Patient Name: Gary Nelson Date of Encounter: 07/20/2021  [facility] Cardiologist: None   Subjective   Postop day 1 surgical treatment for left hip fracture.  Patient denies chest pain or shortness of breath.  Inpatient Medications    Scheduled Meds:  acetaminophen  500 mg Oral Q6H   aspirin EC  81 mg Oral Daily   atorvastatin  10 mg Oral q1800   docusate sodium  100 mg Oral BID   enoxaparin (LOVENOX) injection  30 mg Subcutaneous E42P   folic acid  1 mg Oral Daily   furosemide  20 mg Oral BID   gabapentin  600 mg Oral BID   insulin aspart  0-9 Units Subcutaneous Q4H   irbesartan  75 mg Oral Daily   multivitamin with minerals  1 tablet Oral Daily   tamsulosin  0.4 mg Oral Daily   thiamine  100 mg Oral Daily   Or   thiamine  100 mg Intravenous Daily   Zinc Oxide   Topical BID   Continuous Infusions:  cefTRIAXone (ROCEPHIN)  IV 2 g (07/19/21 1506)   PRN Meds: acetaminophen, albuterol, HYDROcodone-acetaminophen, HYDROcodone-acetaminophen, HYDROmorphone (DILAUDID) injection, LORazepam **OR** LORazepam, menthol-cetylpyridinium **OR** phenol, methocarbamol, ondansetron **OR** ondansetron (ZOFRAN) IV   Vital Signs    Vitals:   07/19/21 2327 07/20/21 0004 07/20/21 0415 07/20/21 0741  BP:  (!) 104/53 (!) 142/59 (!) 150/60  Pulse: (!) 107 98 100 (!) 102  Resp: 14 16 20    Temp:  98 F (36.7 C) 98 F (36.7 C) 98.2 F (36.8 C)  TempSrc:  Oral Oral Oral  SpO2: 97% 100% 100% 99%  Weight:      Height:        Intake/Output Summary (Last 24 hours) at 07/20/2021 0945 Last data filed at 07/19/2021 1030 Gross per 24 hour  Intake 1500 ml  Output 165 ml  Net 1335 ml      07/19/2021    5:00 AM 07/17/2021    2:53 PM 06/06/2021   10:13 AM  Last 3 Weights  Weight (lbs) 249 lb 9 oz 240 lb 244 lb 8 oz  Weight (kg) 113.2 kg 108.863 kg 110.904 kg      Telemetry    Not reviewed- Personally Reviewed  ECG    Not performed today- Personally  Reviewed  Physical Exam   GEN: No acute distress.   Neck: No JVD Cardiac: RRR, no murmurs, rubs, or gallops.  Respiratory: Clear to auscultation bilaterally. GI: Soft, nontender, non-distended  MS: No edema; No deformity. Neuro:  Nonfocal  Psych: Normal affect   Labs    High Sensitivity Troponin:  No results for input(s): "TROPONINIHS" in the last 720 hours.   Chemistry Recent Labs  Lab 07/17/21 1515 07/18/21 0518 07/19/21 0140 07/19/21 2023 07/20/21 0157  NA 140 139 139  --  135  K 4.5 5.3* 5.1  --  5.6*  CL 111 111 112*  --  106  CO2 24 26 24   --  24  GLUCOSE 112* 124* 100*  --  146*  BUN 24* 26* 28*  --  33*  CREATININE 1.43* 1.45* 1.77* 1.72* 1.73*  CALCIUM 8.0* 7.9* 7.5*  --  8.2*  MG 2.0  --  2.1  --   --   GFRNONAA 50* 49* 39* 40* 40*  ANIONGAP 5 2* 3*  --  5    Lipids No results for input(s): "CHOL", "TRIG", "HDL", "LABVLDL", "LDLCALC", "CHOLHDL" in the last 168 hours.  Hematology Recent Labs  Lab 07/19/21 0140 07/19/21 2023 07/20/21 0157  WBC 12.4* 14.8* 15.4*  RBC 2.08* 2.16* 2.03*  HGB 7.2* 7.8* 7.2*  HCT 22.5* 22.1* 20.7*  MCV 108.2* 102.3* 102.0*  MCH 34.6* 36.1* 35.5*  MCHC 32.0 35.3 34.8  RDW 16.3* 17.6* 17.2*  PLT 253 198 216   Thyroid  Recent Labs  Lab 07/17/21 1515  TSH 2.775    BNP Recent Labs  Lab 07/18/21 1554  BNP 71.9    DDimer No results for input(s): "DDIMER" in the last 168 hours.   Radiology    DG FEMUR MIN 2 VIEWS LEFT  Result Date: 07/19/2021 CLINICAL DATA:  Postoperative check performed in PACU. EXAM: LEFT FEMUR 2 VIEWS COMPARISON:  Pelvis and left femur radiographs 07/17/2021 FINDINGS: Interval left femoral long intramedullary nail fixation with 2 distal interlocking screws and proximal femoral neck cephalomedullary screw. Improved alignment with persistent intertrochanteric and proximal left femoral diaphyseal fracture lines noted and mild proximal femoral diaphyseal cortical step-off. Expected postoperative  changes including lateral hip soft tissue swelling and subcutaneous air. Mild medial knee compartment joint space narrowing. Mild-to-moderate peripheral mediolateral compartment of the knee degenerative osteophytes. Mild to moderate peripheral patellar degenerative osteophytes. Arterial vascular calcifications are noted. Partial visualization of the prior calcific densities overlying the left buttock, likely injection granulomas. IMPRESSION: Interval proximal femoral ORIF.  No evidence of hardware failure. Electronically Signed   By: Yvonne Kendall M.D.   On: 07/19/2021 12:14   DG FEMUR MIN 2 VIEWS LEFT  Result Date: 07/19/2021 CLINICAL DATA:  Operative fluoroscopy for left femoral nail fixation. EXAM: LEFT FEMUR 2 VIEWS COMPARISON:  Left femur radiographs and left pelvis radiographs 07/17/2021 FINDINGS: Images were performed intraoperatively without the presence of a radiologist. The patient is undergoing long intramedullary nail fixation with proximal femoral neck screw traversing the previously seen intertrochanteric fracture. Near anatomic alignment Total fluoroscopy images: 6 Total fluoroscopy time: 41 seconds Total dose: Radiation Exposure Index (as provided by the fluoroscopic device): 26.76 mGy air Kerma Please see intraoperative findings for further detail. IMPRESSION: Intraoperative fluoro for left proximal femoral ORIF. Electronically Signed   By: Yvonne Kendall M.D.   On: 07/19/2021 10:09   DG C-Arm 1-60 Min-No Report  Result Date: 07/19/2021 Fluoroscopy was utilized by the requesting physician.  No radiographic interpretation.   DG C-Arm 1-60 Min-No Report  Result Date: 07/19/2021 Fluoroscopy was utilized by the requesting physician.  No radiographic interpretation.    Cardiac Studies   None  Patient Profile     Gary Nelson is a 81 y.o. 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 Denton Brick status post left hip fracture.  Assessment & Plan    1: Preoperative clearance-status post surgical treatment for left hip fracture without cardiac complications. 2: Essential hypertension-blood pressure controlled on current medications.  He is on amlodipine and Micardis 3: Hyperlipidemia-on statin therapy 4: Lower extremity edema-chronic venous stasis changes on p.o. furosemide 5: Chronic anemia-hemoglobin 7.2 which is baseline.  Patient successfully underwent orthopedic procedure left hip.  We did order a 2D echo which is yet to be performed.  No further cardiology follow-up necessary.   [facility] will sign off.   Medication Recommendations: No changes Other recommendations (labs, testing,  etc): No changes Follow up as an outpatient: No outpatient follow-up necessary  For questions or updates, please contact [place] Please consult [email or web address] for contact info under        Signed, Quay Burow, MD  07/20/2021, 9:45 AM

## 2021-07-20 NOTE — Anesthesia Postprocedure Evaluation (Signed)
Anesthesia Post Note  Patient: Gary Nelson  Procedure(s) Performed: INTRAMEDULLARY (IM) NAIL INTERTROCHANTRIC (Left) APPLICATION OF WOUND VAC (Left)     Patient location during evaluation: PACU Anesthesia Type: General Level of consciousness: awake and alert Pain management: pain level controlled Vital Signs Assessment: post-procedure vital signs reviewed and stable Respiratory status: spontaneous breathing, nonlabored ventilation, respiratory function stable and patient connected to nasal cannula oxygen Cardiovascular status: blood pressure returned to baseline and stable Postop Assessment: no apparent nausea or vomiting Anesthetic complications: no   No notable events documented.  Last Vitals:  Vitals:   07/20/21 0415 07/20/21 0741  BP: (!) 142/59 (!) 150/60  Pulse: 100 (!) 102  Resp: 20   Temp: 36.7 C 36.8 C  SpO2: 100% 99%    Last Pain:  Vitals:   07/20/21 0834  TempSrc:   PainSc: 3                  Belenda Cruise P Oney Folz

## 2021-07-20 NOTE — Care Management Important Message (Signed)
Important Message  Patient Details  Name: Gary Nelson MRN: [id number] Date of Birth: 12-09-1940   Medicare Important Message Given:  Yes     Brindle Leyba Montine Circle 07/20/2021, 3:37 PM

## 2021-07-20 NOTE — Evaluation (Signed)
Physical Therapy Evaluation Patient Details Name: Gary Nelson MRN: [id number] DOB: 05/22/1940 Today's Date: 07/20/2021  History of Present Illness  Gary Nelson is a 81 y.o. male who has presented today for surgery, with the diagnosis of left intertrochanteric fracture after a mechanical fall. S/P ORIF L femur.  Clinical Impression  Patient received in bed, agreeable to PT assessment. Patient required max assist for supine to sit. Able to sit edge of bed with set up assist. HR up to 117 with sitting edge of bed. Attempted to remove supplemental O2 from patient, but sats dropped to 79%,therefore patient placed back on O2. He is unable to attempt standing at this time due to pain and HR and unsafe to attempt without +2 assist. Patient required +2 assist to return to bed and get positioned in bed. He will continue to benefit from skilled PT while here to improve mobility and independence.          Recommendations for follow up therapy are one component of a multi-disciplinary discharge planning process, led by the attending physician.  Recommendations may be updated based on patient status, additional functional criteria and insurance authorization.  Follow Up Recommendations Skilled nursing-short term rehab (<3 hours/day)    Assistance Recommended at Discharge Frequent or constant Supervision/Assistance  Patient can return home with the following  Two people to help with walking and/or transfers;Two people to help with bathing/dressing/bathroom    Equipment Recommendations None recommended by PT  Recommendations for Other Services  OT consult    Functional Status Assessment Patient has had a recent decline in their functional status and demonstrates the ability to make significant improvements in function in a reasonable and predictable amount of time.     Precautions / Restrictions Precautions Precautions: Fall Restrictions Weight Bearing Restrictions: Yes LLE Weight Bearing:  Weight bearing as tolerated      Mobility  Bed Mobility Overal bed mobility: Needs Assistance Bed Mobility: Supine to Sit, Sit to Supine, Rolling Rolling: Max assist, +2 for physical assistance   Supine to sit: Max assist Sit to supine: Max assist, +2 for physical assistance   General bed mobility comments: patient limited by pain    Transfers                   General transfer comment: not attempted due to HR in 110s, and O2 into 80%s with mobility.    Ambulation/Gait               General Gait Details: unsafe to attempt today with +1 assist  Stairs            Wheelchair Mobility    Modified Rankin (Stroke Patients Only)       Balance Overall balance assessment: Needs assistance, History of Falls Sitting-balance support: Feet supported Sitting balance-Leahy Scale: Fair                                       Pertinent Vitals/Pain Pain Assessment Pain Assessment: Faces Faces Pain Scale: Hurts even more Pain Location: L LE Pain Descriptors / Indicators: Discomfort, Grimacing, Guarding, Sore Pain Intervention(s): Monitored during session, Repositioned    Home Living Family/patient expects to be discharged to:: Skilled nursing facility Living Arrangements: Alone                      Prior Function Prior Level of Function : Independent/Modified Independent;Needs  assist       Physical Assist : Mobility (physical) Mobility (physical): Stairs   Mobility Comments: uses RW at baseline, has friend who comes 4-5 times per day to assist him. Friend takes him to appointments, gets groceries. ADLs Comments: able to bathe independently per patient     Hand Dominance        Extremity/Trunk Assessment   Upper Extremity Assessment Upper Extremity Assessment: Overall WFL for tasks assessed    Lower Extremity Assessment Lower Extremity Assessment: LLE deficits/detail LLE: Unable to fully assess due to pain LLE  Coordination: decreased gross motor    Cervical / Trunk Assessment Cervical / Trunk Assessment: Normal  Communication   Communication: No difficulties  Cognition Arousal/Alertness: Awake/alert Behavior During Therapy: WFL for tasks assessed/performed Overall Cognitive Status: Within Functional Limits for tasks assessed                                          General Comments      Exercises     Assessment/Plan    PT Assessment Patient needs continued PT services  PT Problem List Decreased strength;Decreased mobility;Decreased activity tolerance;Decreased balance;Pain;Cardiopulmonary status limiting activity;Obesity       PT Treatment Interventions DME instruction;Therapeutic exercise;Gait training;Balance training;Functional mobility training;Therapeutic activities;Patient/family education    PT Goals (Current goals can be found in the Care Plan section)  Acute Rehab PT Goals Patient Stated Goal: get better PT Goal Formulation: With patient Time For Goal Achievement: 08/03/21 Potential to Achieve Goals: Fair    Frequency Min 3X/week     Co-evaluation               AM-PAC PT "6 Clicks" Mobility  Outcome Measure Help needed turning from your back to your side while in a flat bed without using bedrails?: A Lot Help needed moving from lying on your back to sitting on the side of a flat bed without using bedrails?: A Lot Help needed moving to and from a bed to a chair (including a wheelchair)?: Total Help needed standing up from a chair using your arms (e.g., wheelchair or bedside chair)?: Total Help needed to walk in hospital room?: Total Help needed climbing 3-5 steps with a railing? : Total 6 Click Score: 8    End of Session Equipment Utilized During Treatment: Gait belt;Oxygen Activity Tolerance: Patient limited by pain;Patient limited by fatigue Patient left: in bed;with call bell/phone within reach;with nursing/sitter in room Nurse  Communication: Mobility status PT Visit Diagnosis: Unsteadiness on feet (R26.81);History of falling (Z91.81);Other abnormalities of gait and mobility (R26.89);Muscle weakness (generalized) (M62.81);Pain Pain - Right/Left: Left Pain - part of body: Leg    Time: 1011-1045 PT Time Calculation (min) (ACUTE ONLY): 34 min   Charges:   PT Evaluation $PT Eval Moderate Complexity: 1 Mod PT Treatments $Therapeutic Activity: 8-22 mins        Nylan Nakatani, PT, GCS 07/20/21,12:17 PM

## 2021-07-20 NOTE — Progress Notes (Signed)
RT placed patient on CPAP with 3L O2 bled into circuit. Patient tolerating well at this time.

## 2021-07-20 NOTE — ED Notes (Signed)
This RN contacted pts primary RN @ [facility] to inform them that we have pts dentures here in the emergency department. Dentures are located at the secretary desk @ [facility].

## 2021-07-20 NOTE — Progress Notes (Signed)
PROGRESS NOTE    Gary Nelson  KXF:[id number] DOB: April 20, 1940 DOA: 07/17/2021 PCP: Ledell Noss[facility]    Brief Narrative:    Gary Nelson is a 81 y.o. 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  In the ED patient was on 2 L of nasal cannula oxygen blood pressure was stable.  Initial hemoglobin of 7.9, Creatinine was 1.4.  Chest x-ray showed mild right lung base opacity suspected to be effusion versus atelectasis.  X-ray of the left humerus showed a nondisplaced intertrochanteric fracture of the proximal left femur.   CT left hip with acute comminuted fracture proximal left femur, 4.6 cm x 2.4 cm x 8.1 cm chronic appearing collection perimuscular fluid within the soft tissues of the left lateral pelvic wall.  Apparently, he was also noted to have sinus pauses x2 lasting 1 second noted at [facility].  Patient was evaluated by the anesthesiologist at [facility] with recommendation of transfer to [facility] for surgical invention and cardiac evaluation.    During hospitalization, patient has been seen by cardiology and orthopedics.  Patient underwent IM nailing of the left femur on 07/19/2021.  At this time patient is awaiting for PT evaluation.   Assessment & Plan:  Principal Problem:   Closed comminuted intertrochanteric fracture of left femur (HCC) Active Problems:   CHF, chronic (HCC)   Diabetes mellitus ([place])   Colon cancer ([place])   Venous insufficiency of both lower extremities   Morbid obesity ([place])   Sleep apnea   Diarrhea   Cirrhosis of liver (HCC)   Hyperkalemia    Benign essential hypertension   Stage 3b chronic kidney disease (CKD) (HCC)   HLD (hyperlipidemia)   BPH (benign prostatic hyperplasia)   Chronic respiratory failure with hypoxia (HCC)   Abnormal urine   Left proximal femur comminuted fracture Status post IM nailing on the left on 07/19/2021.  Follow-up plan as per orthopedics.  PT OT evaluation pending.  Sinus pause Lasting for 1 second x 2.  Cardiology was consulted.  Amlodipine on hold.  2D echocardiogram has been ordered which is still pending  Abnormal urinalysis. Urinalysis WBC more than 50.  Urine culture shows more than 100,000 colonies of gram-negative rods.  Patient is already on IV Rocephin  Bilateral lower extremity cellulitis with left anterior shin ulceration On IV Rocephin.  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  Mild leukocytosis present.  Dysuria/increased urinary frequency On IV Rocephin.  WBC in urine more than 50 per high-power field.  Hypokalemia.  Mild.  Potassium of 5.6 today.  We will continue to monitor.   Chronic hypoxic respiratory failure with underlying COPD. COPD 2 L of oxygen by nasal cannula at baseline.  Essential hypertension Patient is on telmisartan and amlodipine at home.  Currently amlodipine on hold.  Chronic diastolic congestive heart failure, compensated Chest x-ray with no pulmonary edema.  TTE 09/11/2012 with LVEF 60%.  Continue Lasix, strict intake and output charting Daily  Hyperlipidemia Continue Lipitor.    CKD stage IIIb Baseline creatinine 1.2-1.4.  Follows with nephrology outpatient, Dr. Theador Hawthorne.  Latest creatinine at 1.7.  We will continue to monitor.  BPH:  Continue Flomax 0.4 mg p.o. daily   Type 2 diabetes mellitus Hemoglobin A1c  of 5.4, well controlled.  Home regimen includes NPH 5 units BIDAC.  Continue sliding scale insulin, Accu-Cheks,.  Peripheral neuropathy:  Continue Neurontin.   EtOH use disorder No report of alcohol usage in  the last 6 months.  Currently on CIWA protocol with symptom triggered Ativan.  Continue Multivitamin, thiamine, folic acid   Umbilical hernia: Reducible.   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 Delton Coombes as outpatient.  Seen in clinic 06/27/2021.  Poorly differentiated adenocarcinoma.  Currently on Bevacizumab + Trifluridine/Tipiracil q28d.  Plan is to follow-up with medical oncology as scheduled as outpatient   Morbid obesity Body mass index is 45.35 kg/m.  Would benefit from ongoing weight loss as outpatient.      DVT prophylaxis: enoxaparin (LOVENOX) injection 30 mg Start: 07/20/21 0800 SCDs Start: 07/19/21 1944 Place and maintain sequential compression device Start: 07/17/21 2053   Code Status:     Code Status: Full Code  Disposition: Likely to rehabilitation PT evaluation pending  Status is: Inpatient  Remains inpatient appropriate because: Status post surgical intervention, PT evaluation pending, possible need for rehabitation.   Family Communication:  Spoke with the patient's brother on the phone and updated him about the clinical condition of the patient.  Consultants:  Orthopedics  Procedures:  Left hip surgery on 07/19/2021  Antimicrobials:  Rocephin IV 07/18/2021>  Anti-infectives (From admission, onward)    Start     Dose/Rate Route Frequency Ordered Stop   07/19/21 2030  ceFAZolin (ANCEF) IVPB 2g/100 mL premix  Status:  Discontinued        2 g 200 mL/hr over 30 Minutes Intravenous Every 6 hours 07/19/21 1943 07/19/21 1948   07/19/21 0827  vancomycin (VANCOCIN) powder  Status:  Discontinued          As needed 07/19/21 0827 07/19/21 0921   07/18/21 1500  cefTRIAXone (ROCEPHIN) 2 g in sodium chloride 0.9 % 100 mL IVPB       Note to Pharmacy: Start after UA/culture obtained   2 g 200 mL/hr over 30 Minutes Intravenous Every 24 hours 07/18/21 1405 07/25/21 1459       Subjective: Today, patient was seen and examined at bedside.  Patient feels very fatigued and weak today.  Denies overt pain.  No nausea vomiting shortness of breath chest pain  Objective: Vitals:   07/19/21 2327 07/20/21 0004 07/20/21 0415 07/20/21 0741  BP:  (!) 104/53 (!) 142/59 (!) 150/60  Pulse: (!) 107 98 100 (!) 102  Resp: 14 16 20    Temp:  98 F (36.7 C) 98 F (36.7 C) 98.2 F (36.8 C)  TempSrc:  Oral Oral Oral  SpO2: 97% 100% 100% 99%  Weight:      Height:       No intake or output data in the 24 hours ending 07/20/21 1420  Filed Weights   07/17/21 1453 07/19/21 0500  Weight: 108.9 kg 113.2 kg    Physical Examination: Body mass index is 47.15 kg/m.   General: Morbidly obese built, not in obvious distress, on nasal cannula oxygen HENT:   Pallor noted.  Oral mucosa is moist.  Chest:  Clear breath sounds.  Diminished breath sounds bilaterally. No crackles or wheezes.  CVS: S1 &S2 heard. No murmur.  Regular rate and rhythm. Abdomen: Soft, nontender,  Obese abdomen.  Bowel sounds are heard.   Extremities: No cyanosis, clubbing, Peripheral pulses are palpable.  Left thigh with long incision covered with dressing with edema.  Distal capillary refill present. Psych: Alert, awake and oriented, normal mood CNS:  No cranial nerve deficits.  Moves extremities. Skin: Warm and dry.  Status post left extremity surgery.  Data Reviewed:   CBC: Recent Labs  Lab 07/17/21 1515 07/18/21 0518 07/19/21 0140 07/19/21 2023 07/20/21 0157  WBC 8.3 11.0* 12.4* 14.8* 15.4*  NEUTROABS 6.0  --   --   --   --   HGB 7.9* 7.8* 7.2* 7.8* 7.2*  HCT 24.3* 24.5* 22.5* 22.1* 20.7*  MCV 107.0* 110.4* 108.2* 102.3* 102.0*  PLT 279 268 253 198 216     Basic Metabolic Panel: Recent Labs  Lab 07/17/21 1515 07/18/21 0518 07/19/21 0140 07/19/21 2023 07/20/21 0157  NA 140 139 139  --  135  K 4.5 5.3* 5.1  --  5.6*  CL 111 111 112*  --  106  CO2 24 26 24   --  24  GLUCOSE 112*  124* 100*  --  146*  BUN 24* 26* 28*  --  33*  CREATININE 1.43* 1.45* 1.77* 1.72* 1.73*  CALCIUM 8.0* 7.9* 7.5*  --  8.2*  MG 2.0  --  2.1  --   --      Liver Function Tests: No results for input(s): "AST", "ALT", "ALKPHOS", "BILITOT", "PROT", "ALBUMIN" in the last 168 hours.   Radiology Studies: DG FEMUR MIN 2 VIEWS LEFT  Result Date: 07/19/2021 CLINICAL DATA:  Postoperative check performed in PACU. EXAM: LEFT FEMUR 2 VIEWS COMPARISON:  Pelvis and left femur radiographs 07/17/2021 FINDINGS: Interval left femoral long intramedullary nail fixation with 2 distal interlocking screws and proximal femoral neck cephalomedullary screw. Improved alignment with persistent intertrochanteric and proximal left femoral diaphyseal fracture lines noted and mild proximal femoral diaphyseal cortical step-off. Expected postoperative changes including lateral hip soft tissue swelling and subcutaneous air. Mild medial knee compartment joint space narrowing. Mild-to-moderate peripheral mediolateral compartment of the knee degenerative osteophytes. Mild to moderate peripheral patellar degenerative osteophytes. Arterial vascular calcifications are noted. Partial visualization of the prior calcific densities overlying the left buttock, likely injection granulomas. IMPRESSION: Interval proximal femoral ORIF.  No evidence of hardware failure. Electronically Signed   By: Yvonne Kendall M.D.   On: 07/19/2021 12:14   DG FEMUR MIN 2 VIEWS LEFT  Result Date: 07/19/2021 CLINICAL DATA:  Operative fluoroscopy for left femoral nail fixation. EXAM: LEFT FEMUR 2 VIEWS COMPARISON:  Left femur radiographs and left pelvis radiographs 07/17/2021 FINDINGS: Images were performed intraoperatively without the presence of a radiologist. The patient is undergoing long intramedullary nail fixation with proximal femoral neck screw traversing the previously seen intertrochanteric fracture. Near anatomic alignment Total fluoroscopy images: 6 Total  fluoroscopy time: 41 seconds Total dose: Radiation Exposure Index (as provided by the fluoroscopic device): 26.76 mGy air Kerma Please see intraoperative findings for further detail. IMPRESSION: Intraoperative fluoro for left proximal femoral ORIF. Electronically Signed   By: Yvonne Kendall M.D.   On: 07/19/2021 10:09   DG C-Arm 1-60 Min-No Report  Result Date: 07/19/2021 Fluoroscopy was utilized by the requesting physician.  No radiographic interpretation.   DG C-Arm 1-60 Min-No Report  Result Date: 07/19/2021 Fluoroscopy was utilized by the requesting physician.  No radiographic interpretation.      LOS: 3 days    Flora Lipps, MD [facility] Available via Epic secure chat 7am-7pm After  these hours, please refer to coverage provider listed on [email or web address] 07/20/2021, 2:20 PM

## 2021-07-20 NOTE — TOC CAGE-AID Note (Signed)
Transition of Care [facility]) - CAGE-AID Screening   Patient Details  Name: Gary Nelson MRN: [id number] Date of Birth: 1940/05/23  Transition of Care [facility]) CM/SW Contact:    Camreigh Michie C Tarpley-[name], [place] Phone Number: 07/20/2021, 11:21 AM   Clinical Narrative: Pt is unable to participate in Cage Aid. Pt is currently absent from room.  CSW will attempt to assess at a better time.  Keslyn Teater Tarpley-[name], MSW, LCSW-A Pronouns:  She/Her/Hers [facility] HealthTransitions of Care Clinical Social Worker Direct Number:  [phone] [email or web address]  CAGE-AID Screening: Substance Abuse Screening unable to be completed due to: : Patient unable to participate

## 2021-07-20 NOTE — Progress Notes (Signed)
PROGRESS NOTE    Gary TUMMINELLO  WIO:[id number] DOB: July 21, 1940 DOA: 07/17/2021 PCP: Gary Nelson[facility]    Brief Narrative:    Gary Nelson is a 81 y.o. 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  In the ED patient was on 2 L of nasal cannula oxygen blood pressure was stable.  Initial hemoglobin of 7.9, Creatinine was 1.4.  Chest x-ray showed mild right lung base opacity suspected to be effusion versus atelectasis.  X-ray of the left humerus showed a nondisplaced intertrochanteric fracture of the proximal left femur.   CT left hip with acute comminuted fracture proximal left femur, 4.6 cm x 2.4 cm x 8.1 cm chronic appearing collection perimuscular fluid within the soft tissues of the left lateral pelvic wall.  Apparently, he was also noted to have sinus pauses x2 lasting 1 second noted at [facility].  Patient was evaluated by the anesthesiologist at [facility] with recommendation of transfer to [facility] for surgical invention and cardiac evaluation.    During hospitalization, patient has been seen by cardiology and orthopedics.  Patient underwent IM nailing of the left femur on 07/19/2021.  At this time patient is awaiting for PT evaluation.   Assessment & Plan:  Principal Problem:   Closed comminuted intertrochanteric fracture of left femur (HCC) Active Problems:   CHF, chronic (HCC)   Diabetes mellitus ([place])   Colon cancer ([place])   Venous insufficiency of both lower extremities   Morbid obesity ([place])   Sleep apnea   Diarrhea   Cirrhosis of liver (HCC)   Hyperkalemia    Benign essential hypertension   Stage 3b chronic kidney disease (CKD) (HCC)   HLD (hyperlipidemia)   BPH (benign prostatic hyperplasia)   Chronic respiratory failure with hypoxia (HCC)   Abnormal urine   Left proximal femur comminuted fracture Status post IM nailing on the left on 07/19/2021.  Follow-up plan as per orthopedics.  PT OT evaluation pending.  Sinus pause Lasting for 1 second x 2.  Cardiology was consulted.  Amlodipine on hold.  2D echocardiogram has been ordered which is still pending  Abnormal urinalysis. Urinalysis WBC more than 50.  Urine culture shows more than 100,000 colonies of gram-negative rods.  Patient is already on IV Rocephin  Bilateral lower extremity cellulitis with left anterior shin ulceration On IV Rocephin.  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  Mild leukocytosis present.  Dysuria/increased urinary frequency On IV Rocephin.  WBC in urine more than 50 per high-power field.  Hypokalemia.  Mild.  Potassium of 5.6 today.  We will continue to monitor.   Chronic hypoxic respiratory failure with underlying COPD. COPD 2 L of oxygen by nasal cannula at baseline.  Essential hypertension Patient is on telmisartan and amlodipine at home.  Currently amlodipine on hold.  Chronic diastolic congestive heart failure, compensated Chest x-ray with no pulmonary edema.  TTE 09/11/2012 with LVEF 60%.  Continue Lasix, strict intake and output charting Daily  Hyperlipidemia Continue Lipitor.    CKD stage IIIb Baseline creatinine 1.2-1.4.  Follows with nephrology outpatient, Gary Nelson.  Latest creatinine at 1.7.  We will continue to monitor.  BPH:  Continue Flomax 0.4 mg p.o. daily   Type 2 diabetes mellitus Hemoglobin A1c  of 5.4, well controlled.  Home regimen includes NPH 5 units BIDAC.  Continue sliding scale insulin, Accu-Cheks,.  Peripheral neuropathy:  Continue Neurontin.   EtOH use disorder No report of alcohol usage in  the last 6 months.  Currently on CIWA protocol with symptom triggered Ativan.  Continue Multivitamin, thiamine, folic acid   Umbilical hernia: Reducible.   OSA: Continue nocturnal CPAP   History of stage IV colon cancer with metastasis to lungs Follows with medical oncology outpatient, Gary Nelson as outpatient.  Seen in clinic 06/27/2021.  Poorly differentiated adenocarcinoma.  Currently on Bevacizumab + Trifluridine/Tipiracil q28d.  Plan is to follow-up with medical oncology as scheduled as outpatient   Morbid obesity Body mass index is 45.35 kg/m.  Would benefit from ongoing weight loss as outpatient.      DVT prophylaxis: enoxaparin (LOVENOX) injection 30 mg Start: 07/20/21 0800 SCDs Start: 07/19/21 1944 Place and maintain sequential compression device Start: 07/17/21 2053   Code Status:     Code Status: Full Code  Disposition: Likely to rehabilitation PT evaluation pending  Status is: Inpatient  Remains inpatient appropriate because: Status post surgical intervention, PT evaluation pending, possible need for rehabitation.   Family Communication:  Communicated with the patient at bedside  Consultants:  Orthopedics  Procedures:  Left hip surgery on 07/19/2021  Antimicrobials:  Rocephin IV 07/18/2021>  Anti-infectives (From admission, onward)    Start     Dose/Rate Route Frequency Ordered Stop   07/19/21 2030  ceFAZolin (ANCEF) IVPB 2g/100 mL premix  Status:  Discontinued        2 g 200 mL/hr over 30 Minutes Intravenous Every 6 hours 07/19/21 1943 07/19/21 1948   07/19/21 0827  vancomycin (VANCOCIN) powder  Status:  Discontinued          As needed 07/19/21 0827 07/19/21 0921   07/18/21 1500  cefTRIAXone (ROCEPHIN) 2 g in sodium chloride 0.9 % 100 mL IVPB       Note to Pharmacy: Start after UA/culture obtained   2 g 200 mL/hr over 30 Minutes Intravenous Every 24 hours 07/18/21 1405 07/25/21 1459      Subjective: Today, patient was seen and examined at  bedside.  Patient feels very fatigued and weak today.  Denies overt pain.  No nausea vomiting shortness of breath chest pain  Objective: Vitals:   07/19/21 2327 07/20/21 0004 07/20/21 0415 07/20/21 0741  BP:  (!) 104/53 (!) 142/59 (!) 150/60  Pulse: (!) 107 98 100 (!) 102  Resp: 14 16 20    Temp:  98 F (36.7 C) 98 F (36.7 C) 98.2 F (36.8 C)  TempSrc:  Oral Oral Oral  SpO2: 97% 100% 100% 99%  Weight:      Height:       No intake or output data in the 24 hours ending 07/20/21 1140  Filed Weights   07/17/21 1453 07/19/21 0500  Weight: 108.9 kg 113.2 kg    Physical Examination: Body mass index is 47.15 kg/m.   General: Morbidly obese built, not in obvious distress, on nasal cannula oxygen HENT:   Pallor noted.  Oral mucosa is moist.  Chest:  Clear breath sounds.  Diminished breath sounds bilaterally. No crackles or wheezes.  CVS: S1 &S2 heard. No murmur.  Regular rate and rhythm. Abdomen: Soft, nontender,  Obese abdomen.  Bowel sounds are heard.   Extremities: No  cyanosis, clubbing, Peripheral pulses are palpable.  Left thigh with long incision covered with dressing with edema.  Distal capillary refill present. Psych: Alert, awake and oriented, normal mood CNS:  No cranial nerve deficits.  Moves extremities. Skin: Warm and dry.  Status post left extremity surgery.  Data Reviewed:   CBC: Recent Labs  Lab 07/17/21 1515 07/18/21 0518 07/19/21 0140 07/19/21 2023 07/20/21 0157  WBC 8.3 11.0* 12.4* 14.8* 15.4*  NEUTROABS 6.0  --   --   --   --   HGB 7.9* 7.8* 7.2* 7.8* 7.2*  HCT 24.3* 24.5* 22.5* 22.1* 20.7*  MCV 107.0* 110.4* 108.2* 102.3* 102.0*  PLT 279 268 253 198 409    Basic Metabolic Panel: Recent Labs  Lab 07/17/21 1515 07/18/21 0518 07/19/21 0140 07/19/21 2023 07/20/21 0157  NA 140 139 139  --  135  K 4.5 5.3* 5.1  --  5.6*  CL 111 111 112*  --  106  CO2 24 26 24   --  24  GLUCOSE 112* 124* 100*  --  146*  BUN 24* 26* 28*  --  33*  CREATININE  1.43* 1.45* 1.77* 1.72* 1.73*  CALCIUM 8.0* 7.9* 7.5*  --  8.2*  MG 2.0  --  2.1  --   --     Liver Function Tests: No results for input(s): "AST", "ALT", "ALKPHOS", "BILITOT", "PROT", "ALBUMIN" in the last 168 hours.   Radiology Studies: DG FEMUR MIN 2 VIEWS LEFT  Result Date: 07/19/2021 CLINICAL DATA:  Postoperative check performed in PACU. EXAM: LEFT FEMUR 2 VIEWS COMPARISON:  Pelvis and left femur radiographs 07/17/2021 FINDINGS: Interval left femoral long intramedullary nail fixation with 2 distal interlocking screws and proximal femoral neck cephalomedullary screw. Improved alignment with persistent intertrochanteric and proximal left femoral diaphyseal fracture lines noted and mild proximal femoral diaphyseal cortical step-off. Expected postoperative changes including lateral hip soft tissue swelling and subcutaneous air. Mild medial knee compartment joint space narrowing. Mild-to-moderate peripheral mediolateral compartment of the knee degenerative osteophytes. Mild to moderate peripheral patellar degenerative osteophytes. Arterial vascular calcifications are noted. Partial visualization of the prior calcific densities overlying the left buttock, likely injection granulomas. IMPRESSION: Interval proximal femoral ORIF.  No evidence of hardware failure. Electronically Signed   By: Yvonne Kendall M.D.   On: 07/19/2021 12:14   DG FEMUR MIN 2 VIEWS LEFT  Result Date: 07/19/2021 CLINICAL DATA:  Operative fluoroscopy for left femoral nail fixation. EXAM: LEFT FEMUR 2 VIEWS COMPARISON:  Left femur radiographs and left pelvis radiographs 07/17/2021 FINDINGS: Images were performed intraoperatively without the presence of a radiologist. The patient is undergoing long intramedullary nail fixation with proximal femoral neck screw traversing the previously seen intertrochanteric fracture. Near anatomic alignment Total fluoroscopy images: 6 Total fluoroscopy time: 41 seconds Total dose: Radiation Exposure  Index (as provided by the fluoroscopic device): 26.76 mGy air Kerma Please see intraoperative findings for further detail. IMPRESSION: Intraoperative fluoro for left proximal femoral ORIF. Electronically Signed   By: Yvonne Kendall M.D.   On: 07/19/2021 10:09   DG C-Arm 1-60 Min-No Report  Result Date: 07/19/2021 Fluoroscopy was utilized by the requesting physician.  No radiographic interpretation.   DG C-Arm 1-60 Min-No Report  Result Date: 07/19/2021 Fluoroscopy was utilized by the requesting physician.  No radiographic interpretation.      LOS: 3 days    Flora Lipps, MD [facility] Available via Epic secure chat 7am-7pm After these hours, please refer to coverage provider listed on [email or web address] 07/20/2021, 11:40 AM

## 2021-07-20 NOTE — TOC Initial Note (Signed)
Transition of Care [facility]) - Initial/Assessment Note    Patient Details  Name: Gary Nelson MRN: [id number] Date of Birth: 26-May-1940  [facility]) CM/SW Contact:    Joanne Chars, LCSW Phone Number: 07/20/2021, 1:54 PM  Clinical Narrative:     CSW met with pt regarding DC recommendation for SNF.  Pt agreeable, says he has been several times before in [facility]: [facility], has not been to SNFs in [place].  Choice document given, permission given to send out referral in hub.  Pt lives alone, no services.  Pt is vaccinated for covid with several boosters.               Expected Discharge Plan: Skilled Nursing Facility Barriers to Discharge: Continued Medical Work up, SNF Pending bed offer   Patient Goals and CMS Choice Patient states their goals for this hospitalization and ongoing recovery are:: "get back where I was" CMS Medicare.gov Compare Post Acute Care list provided to:: Patient Choice offered to / list presented to : Patient  Expected Discharge Plan and Services Expected Discharge Plan: [place] In-house Referral: Clinical Social Work   Post Acute Care Choice: [facility] Living arrangements for the past 2 months: [place]                                      Prior Living Arrangements/Services Living arrangements for the past 2 months: Single Family Home Lives with:: Self Patient language and need for interpreter reviewed:: Yes Do you feel safe going back to the place where you live?: Yes      Need for Family Participation in Patient Care: No (Comment) Care giver support system in place?: Yes (comment) Current home services: Other (comment) (none) Criminal Activity/Legal Involvement Pertinent to Current Situation/Hospitalization: No - Comment as needed  Activities of Daily Living Home Assistive Devices/Equipment: Oxygen, Walker (specify type) ADL Screening (condition at time of admission) Patient's  cognitive ability adequate to safely complete daily activities?: Yes Is the patient deaf or have difficulty hearing?: No Does the patient have difficulty seeing, even when wearing glasses/contacts?: No Does the patient have difficulty concentrating, remembering, or making decisions?: No Patient able to express need for assistance with ADLs?: Yes Does the patient have difficulty dressing or bathing?: Yes Independently performs ADLs?: No Communication: Independent Dressing (OT): Needs assistance Is this a change from baseline?: Change from baseline, expected to last >3 days Grooming: Needs assistance Is this a change from baseline?: Change from baseline, expected to last >3 days Feeding: Independent Bathing: Needs assistance Is this a change from baseline?: Change from baseline, expected to last >3 days Toileting: Needs assistance Is this a change from baseline?: Change from baseline, expected to last >3days In/Out Bed: Needs assistance Is this a change from baseline?: Change from baseline, expected to last >3 days Walks in Home: Independent with device (comment), Needs assistance Is this a change from baseline?: Change from baseline, expected to last >3 days Does the patient have difficulty walking or climbing stairs?: Yes Weakness of Legs: Both Weakness of Arms/Hands: Both  Permission Sought/Granted Permission sought to share information with : Other (comment)       Permission granted to share info w AGENCY: SNF        Emotional Assessment Appearance:: Appears stated age Attitude/Demeanor/Rapport: Engaged Affect (typically observed): Appropriate, Pleasant Orientation: : Oriented to Self, Oriented to Place Alcohol / Substance  Use: Not Applicable Psych Involvement: No (comment)  Admission diagnosis:  Closed fracture of left hip, initial encounter ([place]) [S72.002A] Nondisplaced intertrochanteric fracture of left femur, initial encounter for closed fracture ([place])  [S72.145A] Patient Active Problem List   Diagnosis Date Noted   Stage 3b chronic kidney disease (CKD) ([place]) 07/18/2021   HLD (hyperlipidemia) 07/18/2021   BPH (benign prostatic hyperplasia) 07/18/2021   Chronic respiratory failure with hypoxia ([place]) 07/18/2021   Abnormal urine 07/18/2021   Closed comminuted intertrochanteric fracture of left femur ([place]) 07/17/2021   Benign essential hypertension 08/24/2020   Colon cancer ([place]) 08/24/2020   Personal history of colon cancer, stage II 08/24/2020   Primary localized osteoarthritis of both hips 08/24/2020   Type 2 diabetes mellitus without complication, with long-term current use of insulin ([place]) 08/24/2020   Hyperkalemia 08/18/2020   Cellulitis of left upper extremity 06/08/2020   Iron deficiency 07/31/2019   Cirrhosis of liver ([place]) 07/28/2019   Portal hypertension ([place]) 07/28/2019   Risk for falls 12/23/2017   History of DVT in adulthood 03/26/2017   Malignant neoplasm metastatic to lung ([place]) 10/20/2015   Adenocarcinoma of colon ([place]) 07/15/2015   Sepsis ([place]) 03/19/2013   CHF, chronic ([place]) 03/19/2013   Diarrhea 02/19/2013   Dvt femoral (deep venous thrombosis) ([place]) 02/19/2013   Venous insufficiency of both lower extremities 09/09/2012   Diabetes mellitus ([place]) 09/09/2012   Morbid obesity ([place]) 09/09/2012   Sleep apnea 09/09/2012   CKD (chronic kidney disease) 09/09/2012   S/P cervical spinal fusion 09/27/2011   Cervical myelopathy ([place]) 06/14/2011   PCP:  [place], [place]:   [place], [place] [address] Phone: [phone] Fax: [phone]  [facility] [address] [place] [address] Phone: [phone] Fax: [phone]     Social Determinants of Health (SDOH) Interventions    Readmission Risk Interventions     No data to display

## 2021-07-20 NOTE — Plan of Care (Signed)
  Problem: Nutrition: Goal: Adequate nutrition will be maintained Outcome: Progressing   Problem: Pain Managment: Goal: General experience of comfort will improve Outcome: Progressing   Problem: Safety: Goal: Ability to remain free from injury will improve Outcome: Progressing   

## 2021-07-20 NOTE — Progress Notes (Signed)
Echocardiogram 2D Echocardiogram has been performed.  Gary Nelson 07/20/2021, 2:18 PM

## 2021-07-21 DIAGNOSIS — I5032 Chronic diastolic (congestive) heart failure: Secondary | ICD-10-CM | POA: Diagnosis not present

## 2021-07-21 DIAGNOSIS — N400 Enlarged prostate without lower urinary tract symptoms: Secondary | ICD-10-CM | POA: Diagnosis not present

## 2021-07-21 DIAGNOSIS — I10 Essential (primary) hypertension: Secondary | ICD-10-CM | POA: Diagnosis not present

## 2021-07-21 DIAGNOSIS — R8290 Unspecified abnormal findings in urine: Secondary | ICD-10-CM | POA: Diagnosis not present

## 2021-07-21 LAB — CBC
HCT: 18.9 % — ABNORMAL LOW (ref 39.0–52.0)
Hemoglobin: 6.4 g/dL — CL (ref 13.0–17.0)
MCH: 34.6 pg — ABNORMAL HIGH (ref 26.0–34.0)
MCHC: 33.9 g/dL (ref 30.0–36.0)
MCV: 102.2 fL — ABNORMAL HIGH (ref 80.0–100.0)
Platelets: 180 10*3/uL (ref 150–400)
RBC: 1.85 MIL/uL — ABNORMAL LOW (ref 4.22–5.81)
RDW: 16.3 % — ABNORMAL HIGH (ref 11.5–15.5)
WBC: 12.3 10*3/uL — ABNORMAL HIGH (ref 4.0–10.5)
nRBC: 0.2 % (ref 0.0–0.2)

## 2021-07-21 LAB — PREPARE RBC (CROSSMATCH)

## 2021-07-21 LAB — HEMOGLOBIN AND HEMATOCRIT, BLOOD
HCT: 24.4 % — ABNORMAL LOW (ref 39.0–52.0)
Hemoglobin: 8.5 g/dL — ABNORMAL LOW (ref 13.0–17.0)

## 2021-07-21 LAB — GLUCOSE, CAPILLARY
Glucose-Capillary: 131 mg/dL — ABNORMAL HIGH (ref 70–99)
Glucose-Capillary: 131 mg/dL — ABNORMAL HIGH (ref 70–99)
Glucose-Capillary: 149 mg/dL — ABNORMAL HIGH (ref 70–99)
Glucose-Capillary: 150 mg/dL — ABNORMAL HIGH (ref 70–99)
Glucose-Capillary: 152 mg/dL — ABNORMAL HIGH (ref 70–99)
Glucose-Capillary: 184 mg/dL — ABNORMAL HIGH (ref 70–99)
Glucose-Capillary: 185 mg/dL — ABNORMAL HIGH (ref 70–99)

## 2021-07-21 LAB — BASIC METABOLIC PANEL
Anion gap: 5 (ref 5–15)
BUN: 42 mg/dL — ABNORMAL HIGH (ref 8–23)
CO2: 23 mmol/L (ref 22–32)
Calcium: 7.9 mg/dL — ABNORMAL LOW (ref 8.9–10.3)
Chloride: 105 mmol/L (ref 98–111)
Creatinine, Ser: 1.58 mg/dL — ABNORMAL HIGH (ref 0.61–1.24)
GFR, Estimated: 44 mL/min — ABNORMAL LOW (ref >60)
Glucose, Bld: 171 mg/dL — ABNORMAL HIGH (ref 70–99)
Potassium: 5 mmol/L (ref 3.5–5.1)
Sodium: 133 mmol/L — ABNORMAL LOW (ref 135–145)

## 2021-07-21 LAB — MAGNESIUM: Magnesium: 2.2 mg/dL (ref 1.7–2.4)

## 2021-07-21 MED ORDER — SODIUM CHLORIDE 0.9% IV SOLUTION: Freq: Once | INTRAVENOUS | Status: DC

## 2021-07-21 NOTE — Progress Notes (Signed)
RT placed patient on CPAP HS. 3L O2 bleed in needed. Patient tolerating well at this tme

## 2021-07-21 NOTE — Progress Notes (Signed)
PROGRESS NOTE    Gary Nelson  MBE:[id number] DOB: 08-01-1940 DOA: 07/17/2021 PCP: Led[facility] Noss[facility]    Brief Narrative:    Gary Nelson is a 81 y.o. 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  In the ED patient was on 2 L of nasal cannula oxygen blood pressure was stable.  Initial hemoglobin of 7.9, Creatinine was 1.4.  Chest x-ray showed mild right lung base opacity suspected to be effusion versus atelectasis.  X-ray of the left humerus showed a nondisplaced intertrochanteric fracture of the proximal left femur.   CT left hip with acute comminuted fracture proximal left femur, 4.6 cm x 2.4 cm x 8.1 cm chronic appearing collection perimuscular fluid within the soft tissues of the left lateral pelvic wall.  Apparently, he was also noted to have sinus pauses x2 lasting 1 second noted at [facility].  Patient was evaluated by the anesthesiologist at [facility] with recommendation of transfer to [facility] for surgical invention and cardiac evaluation.    During hospitalization, patient has been seen by cardiology and orthopedics.  Patient underwent IM nailing of the left femur on 07/19/2021.  At this time patient is awaiting for PT evaluation.   Assessment & Plan:  Principal Problem:   Closed comminuted intertrochanteric fracture of left femur (HCC) Active Problems:   CHF, chronic (HCC)   Diabetes mellitus ([place])   Colon cancer ([place])   Venous insufficiency of both lower extremities   Morbid obesity ([place])   Sleep apnea   Diarrhea   Cirrhosis of liver (HCC)   Hyperkalemia    Benign essential hypertension   Stage 3b chronic kidney disease (CKD) (HCC)   HLD (hyperlipidemia)   BPH (benign prostatic hyperplasia)   Chronic respiratory failure with hypoxia (HCC)   Abnormal urine   Left proximal femur comminuted fracture Status post IM nailing on the left on 07/19/2021.  Follow-up plan as per orthopedics.  PT recommends skilled nursing facility placement at this time.  As per orthopedics weightbearing as tolerated.  DVT prophylaxis 30 mg of Lovenox daily.  Still has Prevena incisional VAC and the plan is to give for 7 days and remove.  Acute blood loss anemia on chronic anemia.  Hemoglobin today at 6.4.  Has received 1 unit of packed RBC.  Repeat hemoglobin at 8.4.  Check A1c in AM.  Transfuse as necessary.  Sinus pause Lasting for 1 second x 2.  Cardiology was consulted.  Amlodipine on hold.  2D echocardiogram showed LV ejection fraction of 60 to 65%, no regional wall motion abnormality.  Bilateral lower extremity cellulitis with left anterior shin ulceration On IV Rocephin.  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  Mild leukocytosis present..  We will get wound care consultation.  Dysuria/increased urinary frequency, abnormal urinalysis likely E. coli On IV Rocephin.  Urine culture sensitive for Rocephin.  Hyperkalemia.  Improved today.  Latest potassium of 5.0.   Chronic hypoxic respiratory failure with underlying COPD. COPD 2 L of oxygen by nasal cannula at baseline.  No active issues.  Essential hypertension Patient is on telmisartan and amlodipine at home.  Currently amlodipine on hold.  We will hold telmisartan for now due to borderline hyperkalemia.Continue oral Lasix.  Chronic diastolic congestive heart failure, compensated Chest x-ray with no pulmonary edema.  TTE 09/11/2012 with LVEF 60%.  Continue oral Lasix, strict intake and output charting Daily.    Hyperlipidemia Continue Lipitor.    CKD stage IIIb Baseline  creatinine 1.2-1.4.  Follows with nephrology outpatient, Dr. Theador Hawthorne.  Latest creatinine at 1.5 and trending down from 1.7..  We will continue to monitor.  BPH:  Continue Flomax 0.4 mg p.o. daily   Type 2 diabetes mellitus Hemoglobin A1c  of 5.4, well controlled.  Home regimen includes NPH 5 units BIDAC.  Continue sliding scale insulin, Accu-Cheks while in the hospital.  Latest POC glucose of 152.  Peripheral neuropathy:  Continue Neurontin.   EtOH use disorder No report of alcohol usage in the last 6 months.  Currently on CIWA protocol with symptom triggered Ativan.  Continue Multivitamin, thiamine, folic acid   Umbilical hernia: Reducible.   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 Delton Coombes as outpatient.  Seen in clinic 06/27/2021.  Poorly differentiated adenocarcinoma.  Currently on Bevacizumab + Trifluridine/Tipiracil q28d.  Plan is to follow-up with medical oncology as scheduled as outpatient   Morbid obesity Body mass index is 45.35 kg/m.  Would benefit from ongoing weight loss as outpatient.      DVT prophylaxis: enoxaparin (LOVENOX) injection 40 mg Start: 07/21/21 0800 SCDs Start: 07/19/21 1944 Place and maintain sequential compression device Start: 07/17/21 2053   Code Status:     Code Status: Full Code  Disposition: Physical therapy recommend skilled nursing facility placement.  Status is: Inpatient  Remains inpatient appropriate because: Status post surgical intervention, PT evaluation pending, possible need for rehabitation.   Family Communication:  Spoke with the patient's brother on the phone on 07/20/2021 Consultants:  Orthopedics  Procedures:  Left hip surgery on 07/19/2021 PRBC transfusion 1 unit  Antimicrobials:  Rocephin IV 07/18/2021>  Anti-infectives (From admission, onward)    Start     Dose/Rate Route Frequency Ordered Stop   07/19/21 2030   ceFAZolin (ANCEF) IVPB 2g/100 mL premix  Status:  Discontinued        2 g 200 mL/hr over 30 Minutes Intravenous Every 6 hours 07/19/21 1943 07/19/21 1948   07/19/21 0827  vancomycin (VANCOCIN) powder  Status:  Discontinued          As needed 07/19/21 0827 07/19/21 0921   07/18/21 1500  cefTRIAXone (ROCEPHIN) 2 g in sodium chloride 0.9 % 100 mL IVPB       Note to Pharmacy: Start after UA/culture obtained   2 g 200 mL/hr over 30 Minutes Intravenous Every 24 hours 07/18/21 1405 07/25/21 1459      Subjective: Today, patient was seen and examined at bedside.  Denies interval complaints.  No nausea vomiting fever or chills.    Objective: Vitals:   07/21/21 0407 07/21/21 0500 07/21/21 0609 07/21/21 0810  BP: (!) 110/54  (!) 151/70 (!) 141/55  Pulse: 92  94 93  Resp: 11  12 11   Temp: 98.4 F (36.9 C)  98.1 F (36.7 C) (!) 97.5 F (36.4 C)  TempSrc: Oral  Oral Axillary  SpO2: 100%  100% 99%  Weight:  96.3 kg    Height:        Intake/Output Summary (Last 24 hours) at 07/21/2021 1306 Last data filed at 07/21/2021 [phone]  Gross per 24 hour  Intake 590 ml  Output 2400 ml  Net -1810 ml    Filed Weights   07/17/21 1453 07/19/21 0500 07/21/21 0500  Weight: 108.9 kg 113.2 kg 96.3 kg    Physical Examination: Body mass index is 40.11 kg/m.   General: Morbidly obese built, not in obvious distress.  On nasal cannula oxygen HENT:   Mild pallor noted.  Oral mucosa is moist.  Chest:  Clear breath sounds.  Diminished breath sounds bilaterally. No crackles or wheezes.  CVS: S1 &S2 heard. No murmur.  Regular rate and rhythm. Abdomen: Soft, nontender, nondistended.  Bowel sounds are heard.   Extremities: Left thigh with long incision and dressing.  Distal capillary refill present. Psych: Alert, awake and oriented, normal mood CNS:  No cranial nerve deficits.  Power equal in all extremities.   Skin: Warm and dry.  Left thigh with dressing from surgery.  Data Reviewed:   CBC: Recent Labs   Lab 07/17/21 1515 07/18/21 0518 07/19/21 0140 07/19/21 0944 07/19/21 2023 07/20/21 0157 07/21/21 0120 07/21/21 0741  WBC 8.3 11.0* 12.4*  --  14.8* 15.4* 12.3*  --   NEUTROABS 6.0  --   --   --   --   --   --   --   HGB 7.9* 7.8* 7.2* 8.2* 7.8* 7.2* 6.4* 8.5*  HCT 24.3* 24.5* 22.5* 24.0* 22.1* 20.7* 18.9* 24.4*  MCV 107.0* 110.4* 108.2*  --  102.3* 102.0* 102.2*  --   PLT 279 268 253  --  198 216 180  --     Basic Metabolic Panel: Recent Labs  Lab 07/17/21 1515 07/18/21 0518 07/19/21 0140 07/19/21 0944 07/19/21 2023 07/20/21 0157 07/21/21 0120  NA 140 139 139 139  --  135 133*  K 4.5 5.3* 5.1 5.3*  --  5.6* 5.0  CL 111 111 112* 110  --  106 105  CO2 24 26 24   --   --  24 23  GLUCOSE 112* 124* 100* 108*  --  146* 171*  BUN 24* 26* 28* 28*  --  33* 42*  CREATININE 1.43* 1.45* 1.77* 1.60* 1.72* 1.73* 1.58*  CALCIUM 8.0* 7.9* 7.5*  --   --  8.2* 7.9*  MG 2.0  --  2.1  --   --   --  2.2    Liver Function Tests: No results for input(s): "AST", "ALT", "ALKPHOS", "BILITOT", "PROT", "ALBUMIN" in the last 168 hours.   Radiology Studies: ECHOCARDIOGRAM COMPLETE  Result Date: 07/20/2021    ECHOCARDIOGRAM REPORT   Patient Name:   GARIN MATA [facility] Date of Exam: 07/20/2021 Medical Rec #:  [id number]      Height:       61.0 in Accession #:    [id number]     Weight:       249.6 lb Date of Birth:  1940-08-19     BSA:          2.075 m Patient Age:    23 years       BP:           150/60 mmHg Patient Gender: M              HR:           100 bpm. Exam Location:  Inpatient Procedure: 2D Echo, Cardiac Doppler and Color Doppler Indications:    Endocarditis  History:        Patient has no prior history of Echocardiogram examinations.  CHF, COPD; Risk Factors:Hypertension and Diabetes.  Sonographer:    Joette Catching RCS Referring Phys: [id number] Margie Billet IMPRESSIONS  1. Left ventricular ejection fraction, by estimation, is 60 to 65%. The left ventricle has normal function. The left  ventricle has no regional wall motion abnormalities. Left ventricular diastolic parameters were normal.  2. Right ventricular systolic function is normal. The right ventricular size is normal.  3. The mitral valve is normal in structure. Trivial mitral valve regurgitation.  4. The aortic valve is tricuspid. Aortic valve regurgitation is not visualized.  5. The inferior vena cava is dilated in size with >50% respiratory variability, suggesting right atrial pressure of 8 mmHg. FINDINGS  Left Ventricle: Left ventricular ejection fraction, by estimation, is 60 to 65%. The left ventricle has normal function. The left ventricle has no regional wall motion abnormalities. The left ventricular internal cavity size was normal in size. There is  no left ventricular hypertrophy. Left ventricular diastolic parameters were normal. Right Ventricle: The right ventricular size is normal. Right vetricular wall thickness was not assessed. Right ventricular systolic function is normal. Left Atrium: Left atrial size was normal in size. Right Atrium: Right atrial size was normal in size. Pericardium: There is no evidence of pericardial effusion. Mitral Valve: The mitral valve is normal in structure. Trivial mitral valve regurgitation. Tricuspid Valve: The tricuspid valve is normal in structure. Tricuspid valve regurgitation is trivial. Aortic Valve: The aortic valve is tricuspid. Aortic valve regurgitation is not visualized. Aortic valve mean gradient measures 5.0 mmHg. Aortic valve peak gradient measures 9.2 mmHg. Aortic valve area, by VTI measures 3.94 cm. Pulmonic Valve: The pulmonic valve was not well visualized. Pulmonic valve regurgitation is not visualized. Aorta: The aortic root and ascending aorta are structurally normal, with no evidence of dilitation. Venous: The inferior vena cava is dilated in size with greater than 50% respiratory variability, suggesting right atrial pressure of 8 mmHg. IAS/Shunts: No atrial level shunt  detected by color flow Doppler.  LEFT VENTRICLE PLAX 2D LVIDd:         4.30 cm     Diastology LVIDs:         2.80 cm     LV e' medial:    10.70 cm/s LV PW:         1.10 cm     LV E/e' medial:  7.3 LV IVS:        1.10 cm     LV e' lateral:   8.38 cm/s LVOT diam:     2.40 cm     LV E/e' lateral: 9.3 LV SV:         113 LV SV Index:   55 LVOT Area:     4.52 cm  LV Volumes (MOD) LV vol d, MOD A4C: 94.1 ml LV vol s, MOD A4C: 34.5 ml LV SV MOD A4C:     94.1 ml RIGHT VENTRICLE             IVC RV Basal diam:  3.60 cm     IVC diam: 2.10 cm RV Mid diam:    2.30 cm RV S prime:     12.40 cm/s TAPSE (M-mode): 2.6 cm LEFT ATRIUM             Index        RIGHT ATRIUM           Index LA diam:        2.50 cm 1.20 cm/m   RA Area:  10.90 cm LA Vol (A2C):   48.1 ml 23.18 ml/m  RA Volume:   20.70 ml  9.98 ml/m LA Vol (A4C):   58.2 ml 28.05 ml/m LA Biplane Vol: 53.2 ml 25.64 ml/m  AORTIC VALVE                     PULMONIC VALVE AV Area (Vmax):    3.57 cm      PV Vmax:       1.21 m/s AV Area (Vmean):   3.51 cm      PV Peak grad:  5.9 mmHg AV Area (VTI):     3.94 cm AV Vmax:           152.00 cm/s AV Vmean:          111.000 cm/s AV VTI:            0.287 m AV Peak Grad:      9.2 mmHg AV Mean Grad:      5.0 mmHg LVOT Vmax:         120.00 cm/s LVOT Vmean:        86.200 cm/s LVOT VTI:          0.250 m LVOT/AV VTI ratio: 0.87  AORTA Ao Root diam: 3.60 cm Ao Asc diam:  3.70 cm MITRAL VALVE                TRICUSPID VALVE MV Area (PHT): 13.08 cm    TR Peak grad:   30.9 mmHg MV Decel Time: 58 msec      TR Vmax:        278.00 cm/s MV E velocity: 77.70 cm/s MV A velocity: 132.00 cm/s  SHUNTS MV E/A ratio:  0.59         Systemic VTI:  0.25 m                             Systemic Diam: 2.40 cm Dorris Carnes MD Electronically signed by Dorris Carnes MD Signature Date/Time: 07/20/2021/6:30:02 PM    Final       LOS: 4 days    Flora Lipps, MD [facility] Available via Epic secure chat 7am-7pm After these hours, please refer to  coverage provider listed on [email or web address] 07/21/2021, 1:06 PM

## 2021-07-21 NOTE — Consult Note (Signed)
WOC Nurse Consult Note: Reason for Consult:Left pretibial wound, full thickness Wound type: full thickness in the presence of venous insufficiency Pressure Injury POA: N/A Measurement:Per Nursing Flow Sheet, 2.5cm x 4cm x 0.2cm Wound bed: pale red, moist Drainage (amount, consistency, odor) small serous\ Periwound:hemosiderin staining bilaterally Dressing procedure/placement/frequency: I have provided Nursing with conservative topical care guidance for the care of this wound using a soap and water daily cleanse followed by placement of an antimicrobial nonadherent (xeroform) topped with dry gauze, and ABD pad for comfort and securement with a Kerlix roll gauze/paper tape.  [place] nursing team will not follow, but will remain available to this patient, the nursing and medical teams.  Please re-consult if needed.  Thank you for inviting [place] to participate in this patient's Plan of Care.  Maudie Flakes, MSN, RN, CNS, [place], Serita Grammes, [employer], [employer] phone:  [phone]

## 2021-07-21 NOTE — Progress Notes (Signed)
Orthopaedics Daily Progress Note   07/21/2021   12:43 PM  Gary Nelson is a 81 y.o. male 2 Days Post-Op s/p INTRAMEDULLARY (IM) NAIL INTERTROCHANTRIC, APPLICATION OF WOUND VAC  Subjective Pain appropriately controlled.  Resting comfortably.  Objective Vitals:   07/21/21 0609 07/21/21 0810  BP: (!) 151/70 (!) 141/55  Pulse: 94 93  Resp: 12 11  Temp: 98.1 F (36.7 C) (!) 97.5 F (36.4 C)  SpO2: 100% 99%    Intake/Output Summary (Last 24 hours) at 07/21/2021 1243 Last data filed at 07/21/2021 0810 Gross per 24 hour  Intake 590 ml  Output 2400 ml  Net -1810 ml    Physical Exam Resting comfortably NLB LLE: iVAC intact and holding suction WWP distally but DP pulse not palpable (stable from preop exam)   Assessment 81 y.o. male s/p Procedure(s) (LRB): INTRAMEDULLARY (IM) NAIL INTERTROCHANTRIC (Left) APPLICATION OF WOUND VAC (Left)  Plan Mobility: Out of bed with PT/OT Pain control: Continue to wean/titrate to appropriate oral regimen DVT Prophylaxis: Lovenox 30 mg daily, or alternate per primary medical team Further surgical plans: None RUE: No restrictions, weightbearing as tolerated LUE: No restrictions, weightbearing as tolerated RLE: No restrictions, weightbearing as tolerated LLE: No restrictions, weightbearing as tolerated Disposition: Per primary team, as medically appropriate Dressing care: The PREVENA incisional VAC (iVAC) should be left on for 7 days and then removed.  After that the wounds may be dressed with dry sterile gauze dressing and Tegaderm (clear adhesive plastic squares).  If the PREVENA hose is connected to a large VAC machine in the hospital, this should be switched to the small portable PREVENA unit prior to discharge.  Please make sure a responsible party understands how to operate the [facility] unit before discharge, including how to charge the unit.  Please note that the [facility] dressing will ONLY work if connected to suction!  If the [facility] unit  fails and cannot be fixed/restored to normal operation to restore suction, the sponge and plastic dressing should be removed and replaced with gauze/Tegaderm.  If the dressing loses suction due to a leak attempt to seal the leak with Tegaderm or remove the sponge plastic dressing replace with gauze/Tegaderm. Left leg cellulitis: antibiotics, wound care, and dressing changes per Wound Care/primary team Chronic pre-existing issues unrelated to current hip fracture Consider Wound Care consult for formal recs prior to discharge Please make appropriate plans for ongoing treatment of chronic cellulitis, venous stasis, and wound care after discharge--will not be managed/treated by my me as this is outside of the scope of my practice Remainder of care per primary team Follow-up: Please call [facility] [phone]) to schedule follow-up appointment for 2 weeks after surgery.  I have verified that my discharge instructions and follow-up information have been entered in the Discharge Navigator in Epic.  These should automatically populate in the AVS.  Please print the AVS in its entirety and ensure that the patient or a responsible party has a complete copy of the AVS before they are discharged.  If there are questions regarding discharge instructions or follow-up before the AVS is generated, please check the Discharge Navigator before attempting to contact the surgeon/office.  If unsure how to access the Discharge Navigator or the information contained in the Discharge Navigator, or how to generate/print the AVS, please contact the appropriate Nurse, learning disability.   I have printed the following prescriptions and placed them in the patient's hard chart: Lovenox and Norco    Georgeanna Harrison M.D. Orthopaedic Surgery  [facility]

## 2021-07-21 NOTE — Plan of Care (Signed)

## 2021-07-21 NOTE — TOC CAGE-AID Note (Signed)
Transition of Care [facility]) - CAGE-AID Screening   Patient Details  Name: Gary Nelson MRN: [id number] Date of Birth: 09-22-1940  [facility]) CM/SW Contact:    Gaetano Hawthorne Tarpley-[name], [place] Phone Number: 07/21/2021, 10:16 AM   Clinical Narrative: Pt participated in [place].  Pt stated he does not use substance or ETOH.  Pt was not offered resources, due to no usage of substance or ETOH.     Carlyn Mullenbach Tarpley-[name], MSW, LCSW-A Pronouns:  She/Her/Hers [facility] Clinical Social Worker Direct Number:  [phone] [email or web address]  CAGE-AID Screening: Substance Abuse Screening unable to be completed due to: : Patient unable to participate  Have You Ever Felt You Ought to Cut Down on Your Drinking or Drug Use?: No Have People Annoyed You By Critizing Your Drinking Or Drug Use?: No Have You Felt Bad Or Guilty About Your Drinking Or Drug Use?: No Have You Ever Had a Drink or Used Drugs First Thing In The Morning to Steady Your Nerves or to Get Rid of a Hangover?: No CAGE-AID Score: 0  Substance Abuse Education Offered: No

## 2021-07-21 NOTE — TOC Progression Note (Addendum)
Transition of Care [facility]) - Progression Note    Patient Details  Name: Gary Nelson MRN: [id number] Date of Birth: 05-27-1940  [facility]) CM/SW Contact  Joanne Chars, LCSW Phone Number: 07/21/2021, 10:39 AM  Clinical Narrative:   CSW presented bed offers to pt, he would like to accept offer at [facility].  Waiting on confirmation from [facility].   1130: confirmation received from [facility] that they can accept pt tomorrow.   Expected Discharge Plan: [place] Barriers to Discharge: Continued Medical Work up, SNF Pending bed offer  Expected Discharge Plan and Services Expected Discharge Plan: [place] In-house Referral: Clinical Social Work   Post Acute Care Choice: [place] Living arrangements for the past 2 months: Single Family Home                                       Social Determinants of Health (SDOH) Interventions    Readmission Risk Interventions     No data to display

## 2021-07-21 NOTE — Plan of Care (Signed)
No acute events overnight.    Problem: Education: Goal: Knowledge of General Education information will improve Description: Including pain rating scale, medication(s)/side effects and non-pharmacologic comfort measures Outcome: Progressing   Problem: Health Behavior/Discharge Planning: Goal: Ability to manage health-related needs will improve Outcome: Progressing   Problem: Clinical Measurements: Goal: Ability to maintain clinical measurements within normal limits will improve Outcome: Progressing Goal: Will remain free from infection Outcome: Progressing Goal: Diagnostic test results will improve Outcome: Progressing Goal: Respiratory complications will improve Outcome: Progressing Goal: Cardiovascular complication will be avoided Outcome: Progressing   Problem: Activity: Goal: Risk for activity intolerance will decrease Outcome: Progressing   Problem: Nutrition: Goal: Adequate nutrition will be maintained Outcome: Progressing   Problem: Coping: Goal: Level of anxiety will decrease Outcome: Progressing   Problem: Elimination: Goal: Will not experience complications related to bowel motility Outcome: Progressing Goal: Will not experience complications related to urinary retention Outcome: Progressing   Problem: Pain Managment: Goal: General experience of comfort will improve Outcome: Progressing   Problem: Safety: Goal: Ability to remain free from injury will improve Outcome: Progressing   Problem: Skin Integrity: Goal: Risk for impaired skin integrity will decrease Outcome: Progressing   Problem: Education: Goal: Ability to describe self-care measures that may prevent or decrease complications (Diabetes Survival Skills Education) will improve Outcome: Progressing Goal: Individualized Educational Video(s) Outcome: Progressing   Problem: Coping: Goal: Ability to adjust to condition or change in health will improve Outcome: Progressing   Problem: Fluid  Volume: Goal: Ability to maintain a balanced intake and output will improve Outcome: Progressing   Problem: Health Behavior/Discharge Planning: Goal: Ability to identify and utilize available resources and services will improve Outcome: Progressing Goal: Ability to manage health-related needs will improve Outcome: Progressing   Problem: Metabolic: Goal: Ability to maintain appropriate glucose levels will improve Outcome: Progressing   Problem: Nutritional: Goal: Maintenance of adequate nutrition will improve Outcome: Progressing Goal: Progress toward achieving an optimal weight will improve Outcome: Progressing   Problem: Skin Integrity: Goal: Risk for impaired skin integrity will decrease Outcome: Progressing   Problem: Tissue Perfusion: Goal: Adequacy of tissue perfusion will improve Outcome: Progressing   

## 2021-07-22 ENCOUNTER — Other Ambulatory Visit (HOSPITAL_COMMUNITY): Payer: Self-pay

## 2021-07-22 DIAGNOSIS — R8290 Unspecified abnormal findings in urine: Secondary | ICD-10-CM | POA: Diagnosis not present

## 2021-07-22 DIAGNOSIS — E875 Hyperkalemia: Secondary | ICD-10-CM

## 2021-07-22 DIAGNOSIS — I10 Essential (primary) hypertension: Secondary | ICD-10-CM | POA: Diagnosis not present

## 2021-07-22 DIAGNOSIS — N400 Enlarged prostate without lower urinary tract symptoms: Secondary | ICD-10-CM | POA: Diagnosis not present

## 2021-07-22 DIAGNOSIS — S72142A Displaced intertrochanteric fracture of left femur, initial encounter for closed fracture: Secondary | ICD-10-CM | POA: Diagnosis not present

## 2021-07-22 LAB — BPAM RBC
Blood Product Expiration Date: 202306172359
Blood Product Expiration Date: 202306182359
Blood Product Expiration Date: 202306252359
ISSUE DATE / TIME: 202306130830
ISSUE DATE / TIME: 202306130830
ISSUE DATE / TIME: 202306150254
Unit Type and Rh: 600
Unit Type and Rh: 600
Unit Type and Rh: 600

## 2021-07-22 LAB — TYPE AND SCREEN
ABO/RH(D): A NEG
Antibody Screen: NEGATIVE
Unit division: 0
Unit division: 0
Unit division: 0

## 2021-07-22 LAB — CBC
HCT: 21.6 % — ABNORMAL LOW (ref 39.0–52.0)
Hemoglobin: 7.3 g/dL — ABNORMAL LOW (ref 13.0–17.0)
MCH: 33.5 pg (ref 26.0–34.0)
MCHC: 33.8 g/dL (ref 30.0–36.0)
MCV: 99.1 fL (ref 80.0–100.0)
Platelets: 183 10*3/uL (ref 150–400)
RBC: 2.18 MIL/uL — ABNORMAL LOW (ref 4.22–5.81)
RDW: 19.5 % — ABNORMAL HIGH (ref 11.5–15.5)
WBC: 10.1 10*3/uL (ref 4.0–10.5)
nRBC: 0.2 % (ref 0.0–0.2)

## 2021-07-22 LAB — BASIC METABOLIC PANEL
Anion gap: 6 (ref 5–15)
BUN: 39 mg/dL — ABNORMAL HIGH (ref 8–23)
CO2: 27 mmol/L (ref 22–32)
Calcium: 8.1 mg/dL — ABNORMAL LOW (ref 8.9–10.3)
Chloride: 106 mmol/L (ref 98–111)
Creatinine, Ser: 1.51 mg/dL — ABNORMAL HIGH (ref 0.61–1.24)
GFR, Estimated: 47 mL/min — ABNORMAL LOW (ref >60)
Glucose, Bld: 127 mg/dL — ABNORMAL HIGH (ref 70–99)
Potassium: 4.9 mmol/L (ref 3.5–5.1)
Sodium: 139 mmol/L (ref 135–145)

## 2021-07-22 LAB — MAGNESIUM: Magnesium: 2 mg/dL (ref 1.7–2.4)

## 2021-07-22 LAB — GLUCOSE, CAPILLARY
Glucose-Capillary: 148 mg/dL — ABNORMAL HIGH (ref 70–99)
Glucose-Capillary: 113 mg/dL — ABNORMAL HIGH (ref 70–99)

## 2021-07-22 MED ORDER — METHOCARBAMOL 500 MG PO TABS: 500.0000 mg | ORAL_TABLET | Freq: Four times a day (QID) | ORAL | Status: AC | PRN

## 2021-07-22 MED ORDER — ADULT MULTIVITAMIN W/MINERALS CH: 1.0000 | ORAL_TABLET | Freq: Every day | ORAL | Status: AC

## 2021-07-22 MED ORDER — GABAPENTIN 300 MG PO CAPS: 600.0000 mg | ORAL_CAPSULE | Freq: Two times a day (BID) | ORAL | Status: AC

## 2021-07-22 MED ORDER — THIAMINE HCL 100 MG PO TABS: 100.0000 mg | ORAL_TABLET | Freq: Every day | ORAL | Status: AC

## 2021-07-22 MED ORDER — ALBUTEROL SULFATE HFA 108 (90 BASE) MCG/ACT IN AERS: 2.0000 | INHALATION_SPRAY | Freq: Four times a day (QID) | RESPIRATORY_TRACT | Status: AC | PRN

## 2021-07-22 MED ORDER — ONDANSETRON HCL 4 MG PO TABS
4.0000 mg | ORAL_TABLET | Freq: Four times a day (QID) | ORAL | 0 refills | Status: AC | PRN
Start: 2021-07-22 — End: ?

## 2021-07-22 MED ORDER — CEFDINIR 300 MG PO CAPS: 300.0000 mg | ORAL_CAPSULE | Freq: Two times a day (BID) | ORAL | 0 refills | Status: AC

## 2021-07-22 MED ORDER — FOLIC ACID 1 MG PO TABS: 1.0000 mg | ORAL_TABLET | Freq: Every day | ORAL | Status: AC

## 2021-07-22 MED ORDER — DOCUSATE SODIUM 100 MG PO CAPS
100.0000 mg | ORAL_CAPSULE | Freq: Two times a day (BID) | ORAL | 0 refills | Status: AC
  Filled 2021-07-22: qty 10, 5d supply, fill #0

## 2021-07-22 MED ORDER — ACETAMINOPHEN 325 MG PO TABS: 650.0000 mg | ORAL_TABLET | Freq: Four times a day (QID) | ORAL | Status: AC | PRN

## 2021-07-22 NOTE — Discharge Summary (Signed)
Physician Discharge Summary   Patient: Gary Nelson MRN: [id number] DOB: 1940/07/07  Admit date:     07/17/2021  Discharge date: 07/22/21  Discharge Physician: Gary Nelson   PCP: [facility], [facility]   Recommendations at discharge:   Follow-up with your primary care provider at the skilled nursing facility in 3 to 5 days.  Check CBC, BMP magnesium LFT in the next visit Please call [facility] [phone]) to schedule follow-up appointment for 2 weeks after surgery Continue wound care on the left lower extremity as recommended.   Discharge Diagnoses: Principal Problem:   Closed comminuted intertrochanteric fracture of left femur (HCC) Active Problems:   CHF, chronic (HCC)   Diabetes mellitus ([place])   Colon cancer ([place])   Venous insufficiency of both lower extremities   Morbid obesity ([place])   Sleep apnea   Cirrhosis of liver (HCC)   Benign essential hypertension   Stage 3b chronic kidney disease (CKD) (HCC)   HLD (hyperlipidemia)   BPH (benign prostatic hyperplasia)   Chronic respiratory failure with hypoxia ([place])  Resolved Problems:   Diarrhea   Hyperkalemia   Abnormal urine   Hospital course  Gary Nelson is a 81 y.o. 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  In the ED, patient was on 2 L of nasal cannula oxygen blood pressure was stable.  Initial hemoglobin of 7.9, Creatinine was 1.4.  Chest x-ray showed mild right lung base opacity suspected to be effusion versus atelectasis.  X-ray of the left humerus showed a nondisplaced intertrochanteric  fracture of the proximal left femur.   CT left hip with acute comminuted fracture proximal left femur, 4.6 cm x 2.4 cm x 8.1 cm chronic appearing collection perimuscular fluid within the soft tissues of the left lateral pelvic wall.  Apparently, he was also noted to have sinus pauses x2 lasting 1 second noted at [facility].  Patient was evaluated by the anesthesiologist at [facility] with recommendation of transfer to [facility] for surgical invention and cardiac evaluation.    Following conditions were addressed during hospitalization,  Left proximal femur comminuted fracture Status post IM nailing on the left on 07/19/2021. PT recommends skilled nursing facility placement at this time.  As per orthopedics weightbearing as tolerated.  DVT prophylaxis 30 mg of Lovenox daily.  Orthopedics planning to follow-up in 2 weeks.  Acute blood loss anemia on chronic anemia.  Received 1 unit of packed RBC during hospitalization.  Hemoglobin of 7.3 today..  Sinus pause Lasting for 1 second x 2.  Cardiology was consulted.   2D echocardiogram showed LV ejection fraction of 60 to 65%, no regional wall motion abnormality.  Amlodipine has been discontinued for  Bilateral lower extremity cellulitis with left anterior shin ulceration Received IV Rocephin for 5 days.  We will continue with Omnicef for next 2 days.  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  Seen by wound care and will need wound care dressing on discharge.  Dysuria/increased urinary frequency, abnormal urinalysis likely E. coli Received IV Rocephin during hospitalization.  Urine culture sensitive for Rocephin.  Will change  to [facility] on discharge to complete 7-day course.  Hyperkalemia.  Improved.  Potassium was 4.9 prior to discharge.  Will be resumed on ARB from home   Chronic hypoxic respiratory failure with underlying COPD. COPD 2 L of oxygen by nasal cannula at baseline.  No active  issues.  Essential hypertension Off amlodipine.  Resume telmisartan  Chronic diastolic congestive heart failure, compensated Chest x-ray with no pulmonary edema.  TTE 09/11/2012 with LVEF 60%.  Continue oral Lasix on discharge.  Hyperlipidemia Continue Lipitor.    CKD stage IIIb Baseline creatinine 1.2-1.4.  Follows with nephrology outpatient, Gary Nelson.  Latest creatinine at 1.5  BPH:  Continue Flomax 0.4 mg p.o. daily   Type 2 diabetes mellitus Hemoglobin A1c  of 5.4, well controlled.  Home regimen includes NPH 5 units BIDAC.  Resume home insulin regimen on discharge  Peripheral neuropathy:  Continue Neurontin.   EtOH use disorder No report of alcohol usage in the last 6 months.  Patient was on CIWA protocol with symptom triggered Ativan but did not have withdrawal symptoms.  Continue Multivitamin, thiamine, folic acid on discharge.   Umbilical hernia: Reducible.   OSA: Continue nocturnal CPAP   History of stage IV colon cancer with metastasis to lungs Follows with medical oncology outpatient, Gary Nelson as outpatient.  Seen in clinic 06/27/2021.  Poorly differentiated adenocarcinoma.  Currently on Bevacizumab + Trifluridine/Tipiracil q28d.  Plan is to follow-up with medical oncology as scheduled as outpatient   Morbid obesity Body mass index is 45.35 kg/m.  Would benefit from ongoing weight loss as outpatient.  Consultants:  Orthopedics  Procedures performed:  Left hip surgery on 07/19/2021 PRBC transfusion 1 unit   Disposition: Skilled nursing facility Diet recommendation:  Discharge Diet Orders (From admission, onward)     Start     Ordered   07/22/21 0000  Diet general        07/22/21 0923           Regular diet DISCHARGE MEDICATION: Allergies as of 07/22/2021   No Known Allergies      Medication List     STOP taking these medications    amLODipine 5 MG tablet Commonly known as: NORVASC   ketoconazole 2 % cream Commonly known as:  NIZORAL   lidocaine-prilocaine cream Commonly known as: EMLA   Lonsurf 20-8.19 MG tablet Generic drug: trifluridine-tipiracil   naproxen 500 MG tablet Commonly known as: NAPROSYN   nitrofurantoin (macrocrystal-monohydrate) 100 MG capsule Commonly known as: Macrobid   Preparation H 0.25-14-74.9 % rectal ointment Generic drug: phenylephrine-shark liver oil-mineral oil-petrolatum   prochlorperazine 10 MG tablet Commonly known as: COMPAZINE   silver sulfADIAZINE 1 % cream Commonly known as: SILVADENE   sucralfate 1 GM/10ML suspension Commonly known as: CARAFATE       TAKE these medications    acetaminophen 325 MG tablet Commonly known as: TYLENOL Take 2 tablets (650 mg total) by mouth every 6 (six) hours as needed for mild pain, fever or moderate pain (pain score 1-3 or temp > 100.5).   albuterol 108 (90 Base) MCG/ACT inhaler Commonly known as: VENTOLIN HFA Inhale 2 puffs into the lungs every 6 (six) hours as needed for wheezing or shortness of breath.   aspirin EC 81 MG tablet Take 81 mg by mouth daily.   atorvastatin 10 MG tablet Commonly known as: LIPITOR Take 10 mg by mouth daily at 6 PM.   cefdinir 300 MG capsule Commonly known as: OMNICEF Take 1 capsule (300 mg total) by mouth  2 (two) times daily for 2 days.   Cholecalciferol 25 MCG (1000 UT) capsule Take 1,000 Units by mouth daily.   [employer] B-12 5000 MCG Subl Generic drug: Cyanocobalamin Place 1 tablet (5,000 mcg total) under the tongue daily.   diphenoxylate-atropine 2.5-0.025 MG tablet Commonly known as: LOMOTIL Take 1 tablet by mouth 4 (four) times daily as needed for diarrhea or loose stools.   docusate sodium 100 MG capsule Commonly known as: COLACE Take 1 capsule (100 mg total) by mouth 2 (two) times daily.   enoxaparin 30 MG/0.3ML injection Commonly known as: LOVENOX Inject 0.3 mLs (30 mg total) into the skin daily.   folic acid 1 MG tablet Commonly known as: FOLVITE Take 1 tablet (1 mg  total) by mouth daily. Start taking on: July 23, 2021   furosemide 20 MG tablet Commonly known as: LASIX Take 20 mg by mouth 2 (two) times daily.   gabapentin 300 MG capsule Commonly known as: NEURONTIN Take 2 capsules (600 mg total) by mouth 2 (two) times daily.   HYDROcodone-acetaminophen 5-325 MG tablet Commonly known as: NORCO/VICODIN Take 1-2 tablets by mouth every 6 (six) hours as needed for moderate pain or severe pain.   insulin NPH Human 100 UNIT/ML injection Commonly known as: NOVOLIN N Inject 10 Units into the skin 2 (two) times daily before a meal. Per pt, 5 units every morning and 5 units in the evening   methocarbamol 500 MG tablet Commonly known as: ROBAXIN Take 1 tablet (500 mg total) by mouth every 6 (six) hours as needed for up to 5 days for muscle spasms.   multivitamin with minerals Tabs tablet Take 1 tablet by mouth daily. Start taking on: July 23, 2021   ondansetron 4 MG tablet Commonly known as: ZOFRAN Take 1 tablet (4 mg total) by mouth every 6 (six) hours as needed for nausea.   OXYGEN Inhale 2 L into the lungs continuous. At night time only   tamsulosin 0.4 MG Caps capsule Commonly known as: FLOMAX Take 1 capsule by mouth daily.   telmisartan 20 MG tablet Commonly known as: MICARDIS Take 20 mg by mouth daily.   thiamine 100 MG tablet Take 1 tablet (100 mg total) by mouth daily. Start taking on: July 23, 2021               Discharge Care Instructions  (From admission, onward)           Start     Ordered   07/22/21 0000  Discharge wound care:       Comments: Wound care to left LE pretibial wound, full thickness:  Cleanse with soap and water, rinse and pat dry. Cover with folded layer of xeroform gauze Kellie Simmering # [id number]), top with dry gauze, ABD pad and secure with Kerlix roll gauze/paper tape. Change daily.   07/22/21 6834            Follow-up Information     Georgeanna Harrison, MD. Schedule an appointment as soon as possible  for a visit in 2 week(s).   Specialty: Orthopedic Surgery Contact information: [place] [address] [phone]                Subjective. Today, patient was seen and examined at bedside.  Patient denies any nausea vomiting fever chills or rigor.  Denies chest pain or shortness of breath.  Discharge Exam: Filed Weights   07/17/21 1453 07/19/21 0500 07/21/21 0500  Weight: 108.9 kg 113.2 kg 96.3  kg      07/22/2021    8:08 AM 07/22/2021    3:04 AM 07/21/2021    7:53 PM  Vitals with BMI  Systolic [phone]  Diastolic 50 55 53  Pulse 90 96 98   Body mass index is 40.11 kg/m.   General: Morbidly obese built, not in obvious distress HENT:   Mild pallor noted. Oral mucosa is moist.  Chest:  Clear breath sounds.  Diminished breath sounds bilaterally. No crackles or wheezes.  CVS: S1 &S2 heard. No murmur.  Regular rate and rhythm. Abdomen: Soft, nontender, nondistended.  Bowel sounds are heard.   Extremities: Left thigh with long incision in dressing, left leg with dressing from leg ulcer. Psych: Alert, awake and oriented, normal mood CNS:  No cranial nerve deficits.  Power equal in all extremities.   Skin: Warm and dry.  Left thigh with incision normal dressing, left leg with bandage.  Condition at discharge: good  The results of significant diagnostics from this hospitalization (including imaging, microbiology, ancillary and laboratory) are listed below for reference.   Imaging Studies: ECHOCARDIOGRAM COMPLETE  Result Date: 07/20/2021    ECHOCARDIOGRAM REPORT   Patient Name:   HARMAN LANGHANS [facility] Date of Exam: 07/20/2021 Medical Rec #:  [id number]      Height:       61.0 in Accession #:    [id number]     Weight:       249.6 lb Date of Birth:  [birth year]     BSA:          2.075 m Patient Age:    45 years       BP:           150/60 mmHg Patient Gender: M              HR:           100 bpm. Exam Location:  Inpatient Procedure: 2D Echo, Cardiac Doppler and  Color Doppler Indications:    Endocarditis  History:        Patient has no prior history of Echocardiogram examinations.                 CHF, COPD; Risk Factors:Hypertension and Diabetes.  Sonographer:    Joette Catching RCS Referring Phys: [id number] Margie Billet IMPRESSIONS  1. Left ventricular ejection fraction, by estimation, is 60 to 65%. The left ventricle has normal function. The left ventricle has no regional wall motion abnormalities. Left ventricular diastolic parameters were normal.  2. Right ventricular systolic function is normal. The right ventricular size is normal.  3. The mitral valve is normal in structure. Trivial mitral valve regurgitation.  4. The aortic valve is tricuspid. Aortic valve regurgitation is not visualized.  5. The inferior vena cava is dilated in size with >50% respiratory variability, suggesting right atrial pressure of 8 mmHg. FINDINGS  Left Ventricle: Left ventricular ejection fraction, by estimation, is 60 to 65%. The left ventricle has normal function. The left ventricle has no regional wall motion abnormalities. The left ventricular internal cavity size was normal in size. There is  no left ventricular hypertrophy. Left ventricular diastolic parameters were normal. Right Ventricle: The right ventricular size is normal. Right vetricular wall thickness was not assessed. Right ventricular systolic function is normal. Left Atrium: Left atrial size was normal in size. Right Atrium: Right atrial size was normal in size. Pericardium: There is no evidence of pericardial effusion. Mitral Valve: The mitral valve is normal in structure.  Trivial mitral valve regurgitation. Tricuspid Valve: The tricuspid valve is normal in structure. Tricuspid valve regurgitation is trivial. Aortic Valve: The aortic valve is tricuspid. Aortic valve regurgitation is not visualized. Aortic valve mean gradient measures 5.0 mmHg. Aortic valve peak gradient measures 9.2 mmHg. Aortic valve area, by VTI measures 3.94  cm. Pulmonic Valve: The pulmonic valve was not well visualized. Pulmonic valve regurgitation is not visualized. Aorta: The aortic root and ascending aorta are structurally normal, with no evidence of dilitation. Venous: The inferior vena cava is dilated in size with greater than 50% respiratory variability, suggesting right atrial pressure of 8 mmHg. IAS/Shunts: No atrial level shunt detected by color flow Doppler.  LEFT VENTRICLE PLAX 2D LVIDd:         4.30 cm     Diastology LVIDs:         2.80 cm     LV e' medial:    10.70 cm/s LV PW:         1.10 cm     LV E/e' medial:  7.3 LV IVS:        1.10 cm     LV e' lateral:   8.38 cm/s LVOT diam:     2.40 cm     LV E/e' lateral: 9.3 LV SV:         113 LV SV Index:   55 LVOT Area:     4.52 cm  LV Volumes (MOD) LV vol d, MOD A4C: 94.1 ml LV vol s, MOD A4C: 34.5 ml LV SV MOD A4C:     94.1 ml RIGHT VENTRICLE             IVC RV Basal diam:  3.60 cm     IVC diam: 2.10 cm RV Mid diam:    2.30 cm RV S prime:     12.40 cm/s TAPSE (M-mode): 2.6 cm LEFT ATRIUM             Index        RIGHT ATRIUM           Index LA diam:        2.50 cm 1.20 cm/m   RA Area:     10.90 cm LA Vol (A2C):   48.1 ml 23.18 ml/m  RA Volume:   20.70 ml  9.98 ml/m LA Vol (A4C):   58.2 ml 28.05 ml/m LA Biplane Vol: 53.2 ml 25.64 ml/m  AORTIC VALVE                     PULMONIC VALVE AV Area (Vmax):    3.57 cm      PV Vmax:       1.21 m/s AV Area (Vmean):   3.51 cm      PV Peak grad:  5.9 mmHg AV Area (VTI):     3.94 cm AV Vmax:           152.00 cm/s AV Vmean:          111.000 cm/s AV VTI:            0.287 m AV Peak Grad:      9.2 mmHg AV Mean Grad:      5.0 mmHg LVOT Vmax:         120.00 cm/s LVOT Vmean:        86.200 cm/s LVOT VTI:          0.250 m LVOT/AV VTI ratio: 0.87  AORTA Ao Root diam: 3.60 cm Ao  Asc diam:  3.70 cm MITRAL VALVE                TRICUSPID VALVE MV Area (PHT): 13.08 cm    TR Peak grad:   30.9 mmHg MV Decel Time: 58 msec      TR Vmax:        278.00 cm/s MV E velocity: 77.70 cm/s  MV A velocity: 132.00 cm/s  SHUNTS MV E/A ratio:  0.59         Systemic VTI:  0.25 m                             Systemic Diam: 2.40 cm Dorris Carnes MD Electronically signed by Dorris Carnes MD Signature Date/Time: 07/20/2021/6:30:02 PM    Final    DG FEMUR MIN 2 VIEWS LEFT  Result Date: 07/19/2021 CLINICAL DATA:  Postoperative check performed in PACU. EXAM: LEFT FEMUR 2 VIEWS COMPARISON:  Pelvis and left femur radiographs 07/17/2021 FINDINGS: Interval left femoral long intramedullary nail fixation with 2 distal interlocking screws and proximal femoral neck cephalomedullary screw. Improved alignment with persistent intertrochanteric and proximal left femoral diaphyseal fracture lines noted and mild proximal femoral diaphyseal cortical step-off. Expected postoperative changes including lateral hip soft tissue swelling and subcutaneous air. Mild medial knee compartment joint space narrowing. Mild-to-moderate peripheral mediolateral compartment of the knee degenerative osteophytes. Mild to moderate peripheral patellar degenerative osteophytes. Arterial vascular calcifications are noted. Partial visualization of the prior calcific densities overlying the left buttock, likely injection granulomas. IMPRESSION: Interval proximal femoral ORIF.  No evidence of hardware failure. Electronically Signed   By: Yvonne Kendall M.D.   On: 07/19/2021 12:14   DG FEMUR MIN 2 VIEWS LEFT  Result Date: 07/19/2021 CLINICAL DATA:  Operative fluoroscopy for left femoral nail fixation. EXAM: LEFT FEMUR 2 VIEWS COMPARISON:  Left femur radiographs and left pelvis radiographs 07/17/2021 FINDINGS: Images were performed intraoperatively without the presence of a radiologist. The patient is undergoing long intramedullary nail fixation with proximal femoral neck screw traversing the previously seen intertrochanteric fracture. Near anatomic alignment Total fluoroscopy images: 6 Total fluoroscopy time: 41 seconds Total dose: Radiation Exposure Index  (as provided by the fluoroscopic device): 26.76 mGy air Kerma Please see intraoperative findings for further detail. IMPRESSION: Intraoperative fluoro for left proximal femoral ORIF. Electronically Signed   By: Yvonne Kendall M.D.   On: 07/19/2021 10:09   DG C-Arm 1-60 Min-No Report  Result Date: 07/19/2021 Fluoroscopy was utilized by the requesting physician.  No radiographic interpretation.   DG C-Arm 1-60 Min-No Report  Result Date: 07/19/2021 Fluoroscopy was utilized by the requesting physician.  No radiographic interpretation.   CT HIP LEFT WO CONTRAST  Result Date: 07/17/2021 CLINICAL DATA:  Status post fall. EXAM: CT OF THE LEFT HIP WITHOUT CONTRAST TECHNIQUE: Multidetector CT imaging of the left hip was performed according to the standard protocol. Multiplanar CT image reconstructions were also generated. RADIATION DOSE REDUCTION: This exam was performed according to the departmental dose-optimization program which includes automated exposure control, adjustment of the mA and/or kV according to patient size and/or use of iterative reconstruction technique. COMPARISON:  None Available. FINDINGS: Bones/Joint/Cartilage Acute, comminuted fracture deformity is seen extending through the inter trochanteric region of the proximal left femur. Involvement of the adjacent portion of the proximal left femoral shaft is seen. There is no evidence of dislocation. Ligaments Suboptimally assessed by CT. Muscles and Tendons Limited in evaluation and otherwise unremarkable. Soft tissues A  4.6 cm x 2.4 cm x 8.1 cm chronic appearing collection of para muscular fluid (approximately 19.97 Hounsfield units) is seen within the soft tissues of the lateral left pelvic wall. This extends to the level of the left hip. Soft tissue swelling is seen adjacent to the previously noted fracture site with chronic appearing soft tissue calcifications noted along lateral aspect of the left hip. IMPRESSION: 1. Acute, comminuted  fracture of the proximal left femur. 2. 4.6 cm x 2.4 cm x 8.1 cm chronic appearing collection of para muscular fluid within the soft tissues of the lateral left pelvic wall, as described above. Electronically Signed   By: Virgina Norfolk M.D.   On: 07/17/2021 18:12   DG Pelvis 1-2 Views  Result Date: 07/17/2021 CLINICAL DATA:  Fall.  Left hip pain. EXAM: PELVIS - 1-2 VIEW COMPARISON:  None Available. FINDINGS: Intertrochanteric fracture of the proximal left femur, without significant displacement. No angulation. No other fractures. Skeletal structures are diffusely demineralized. Irregularity and flattening along the lateral right ilium consistent with a bone graft harvesting site. Hip joints, SI joints and symphysis pubis are normally aligned. 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 IMPRESSION: 1. Intertrochanteric fracture of the proximal left femur without significant displacement or angulation. Electronically Signed   By: Lajean Manes M.D.   On: 07/17/2021 16:39   DG Femur Min 2 Views Left  Result Date: 07/17/2021 CLINICAL DATA:  Fall.  Left hip pain. EXAM: LEFT FEMUR 2 VIEWS COMPARISON:  None Available. FINDINGS: On a single view, there is a nondisplaced, non comminuted intertrochanteric fracture of the proximal femur. No other fractures.  No bone lesions. Hip and knee joints are normally aligned. Multiple rounded to oval calcifications project lateral to the left hip and hemipelvis, suspected to be injection granuloma. IMPRESSION: 1. Nondisplaced, non comminuted intertrochanteric fracture of the proximal left femur. Electronically Signed   By: Lajean Manes M.D.   On: 07/17/2021 16:37   DG Chest 1 View  Result Date: 07/17/2021 CLINICAL DATA:  Left hip pain. EXAM: CHEST  1 VIEW COMPARISON:  09/16/2015. FINDINGS: Cardiac silhouette normal in size.  No mediastinal or hilar masses. Hazy opacity at the right lung base.  Remainder of the lungs is clear. Suspected small right effusion. No convincing left pleural effusion. No pneumothorax. Right anterior chest wall, internal jugular, Port-A-Cath is stable. Stable changes from a previous anterior and posterior cervical spine fusion and laminectomies. IMPRESSION: 1. Mild opacity at the right lung base suspected to be a small effusion with associated atelectasis. Consider pneumonia if there are consistent clinical findings. 2. No other evidence of acute cardiopulmonary disease. No pulmonary edema. Electronically Signed   By: Lajean Manes M.D.   On: 07/17/2021 16:35   US Venous Img Lower Unilateral Right  Result Date: 07/06/2021 CLINICAL DATA:  Right lower extremity edema. History of DVT. History of malignancy. Evaluate for acute or chronic DVT. EXAM: RIGHT LOWER EXTREMITY VENOUS DOPPLER ULTRASOUND TECHNIQUE: Gray-scale sonography with graded compression, as well as color Doppler and duplex ultrasound were performed to evaluate the lower extremity deep venous systems from the level of the common femoral vein and including the common femoral, femoral, profunda femoral, popliteal and calf veins including the posterior tibial, peroneal and gastrocnemius veins when visible. The superficial great saphenous vein was also interrogated. Spectral Doppler was utilized to evaluate flow at rest and with distal augmentation maneuvers in the common femoral, femoral and popliteal veins. COMPARISON:  CT the  chest, abdomen and pelvis-06/20/2021 FINDINGS: Examination is significantly degraded secondary to subcutaneous edema and poor sonographic window. Contralateral Common Femoral Vein: Respiratory phasicity is normal and symmetric with the symptomatic side. No evidence of thrombus. Normal compressibility. Common Femoral Vein: No evidence of thrombus. Normal compressibility, respiratory phasicity and response to augmentation. Saphenofemoral Junction: No evidence of thrombus. Normal compressibility and  flow on color Doppler imaging. Profunda Femoral Vein: No evidence of thrombus. Normal compressibility and flow on color Doppler imaging. Femoral Vein: No evidence of thrombus. Normal compressibility, respiratory phasicity and response to augmentation. Popliteal Vein: No evidence of thrombus. Normal compressibility, respiratory phasicity and response to augmentation. Calf Veins: Suboptimally visualized. Superficial Great Saphenous Vein: No evidence of thrombus. Normal compressibility. Other Findings: There is a moderate amount subcutaneous edema at the level right lower leg and. IMPRESSION: No definitive acute occlusive DVT on this significantly body habitus degraded examination. Evaluation for chronic DVT is degraded secondary to poor sonographic window Electronically Signed   By: Sandi Mariscal M.D.   On: 07/06/2021 15:28    Microbiology: Results for orders placed or performed during the hospital encounter of 07/17/21  Urine Culture     Status: Abnormal   Collection Time: 07/18/21  2:00 PM   Specimen: Urine, Clean Catch  Result Value Ref Range Status   Specimen Description URINE, CLEAN CATCH  Final   Special Requests   Final    NONE Performed at [facility], [place] [address], [place], [place] [address]    Culture >=100,000 COLONIES/mL ESCHERICHIA COLI (A)  Final   Report Status 07/20/2021 FINAL  Final   Organism ID, Bacteria ESCHERICHIA COLI (A)  Final      Susceptibility   Escherichia coli - MIC*    AMPICILLIN <=2 SENSITIVE Sensitive     CEFAZOLIN <=4 SENSITIVE Sensitive     CEFEPIME <=0.12 SENSITIVE Sensitive     CEFTRIAXONE <=0.25 SENSITIVE Sensitive     CIPROFLOXACIN >=4 RESISTANT Resistant     GENTAMICIN <=1 SENSITIVE Sensitive     IMIPENEM <=0.25 SENSITIVE Sensitive     NITROFURANTOIN <=16 SENSITIVE Sensitive     TRIMETH/SULFA <=20 SENSITIVE Sensitive     AMPICILLIN/SULBACTAM <=2 SENSITIVE Sensitive     PIP/TAZO <=4 SENSITIVE Sensitive     * >=100,000 COLONIES/mL ESCHERICHIA  COLI    Labs: CBC: Recent Labs  Lab 07/17/21 1515 07/18/21 0518 07/19/21 0140 07/19/21 0944 07/19/21 2023 07/20/21 0157 07/21/21 0120 07/21/21 0741 07/22/21 0100  WBC 8.3   < > 12.4*  --  14.8* 15.4* 12.3*  --  10.1  NEUTROABS 6.0  --   --   --   --   --   --   --   --   HGB 7.9*   < > 7.2*   < > 7.8* 7.2* 6.4* 8.5* 7.3*  HCT 24.3*   < > 22.5*   < > 22.1* 20.7* 18.9* 24.4* 21.6*  MCV 107.0*   < > 108.2*  --  102.3* 102.0* 102.2*  --  99.1  PLT 279   < > 253  --  198 216 180  --  183   < > = values in this interval not displayed.   Basic Metabolic Panel: Recent Labs  Lab 07/17/21 1515 07/18/21 0518 07/19/21 0140 07/19/21 0944 07/19/21 2023 07/20/21 0157 07/21/21 0120 07/22/21 0100  NA 140 139 139 139  --  135 133* 139  K 4.5 5.3* 5.1 5.3*  --  5.6* 5.0 4.9  CL 111 111  112* 110  --  106 105 106  CO2 24 26 24   --   --  24 23 27   GLUCOSE 112* 124* 100* 108*  --  146* 171* 127*  BUN 24* 26* 28* 28*  --  33* 42* 39*  CREATININE 1.43* 1.45* 1.77* 1.60* 1.72* 1.73* 1.58* 1.51*  CALCIUM 8.0* 7.9* 7.5*  --   --  8.2* 7.9* 8.1*  MG 2.0  --  2.1  --   --   --  2.2 2.0   Liver Function Tests: No results for input(s): "AST", "ALT", "ALKPHOS", "BILITOT", "PROT", "ALBUMIN" in the last 168 hours. CBG: Recent Labs  Lab 07/21/21 1632 07/21/21 1953 07/21/21 2332 07/22/21 0353 07/22/21 0806  GLUCAP 131* 184* 150* 148* 113*    Discharge time spent: greater than 30 minutes.  Signed: Flora Lipps, MD [facility] 07/22/2021

## 2021-07-22 NOTE — Progress Notes (Signed)
81 yr old male to be discharged to SNF via EMS. AVS printed for facility.

## 2021-07-22 NOTE — TOC Transition Note (Signed)
Transition of Care [facility]) - CM/SW Discharge Note   Patient Details  Name: Gary Nelson MRN: [id number] Date of Birth: 1940/09/08  Transition of [facility]) CM/SW Contact:  Joanne Chars, LCSW Phone Number: 07/22/2021, 9:58 AM   Clinical Narrative:   Pt discharging to [facility].  RN call [phone] for report.     Final next level of care: Skilled Nursing Facility Barriers to Discharge: Barriers Resolved   Patient Goals and CMS Choice Patient states their goals for this hospitalization and ongoing recovery are:: "get back where I was" CMS Medicare.gov Compare Post Acute Care list provided to:: Patient Choice offered to / list presented to : Patient  Discharge Placement              Patient chooses bed at:  [facility]) Patient to be transferred to facility by: [place] Name of family member notified: brother Laverna Peace Patient and family notified of of transfer: 07/22/21  Discharge Plan and Services In-house Referral: Clinical Social Work   Post Acute Care Choice: [place]                               Social Determinants of Health (SDOH) Interventions     Readmission Risk Interventions     No data to display

## 2021-07-22 NOTE — Progress Notes (Signed)
Orthopaedics Daily Progress Note   07/22/2021   7:44 AM  Gary Nelson is a 81 y.o. male 3 Days Post-Op s/p INTRAMEDULLARY (IM) NAIL INTERTROCHANTRIC, APPLICATION OF WOUND VAC  Subjective Pain appropriately controlled.  Resting comfortably.  Objective Vitals:   07/21/21 2330 07/22/21 0304  BP:  (!) 140/55  Pulse:  96  Resp: 13 14  Temp:  98.1 F (36.7 C)  SpO2:  95%    Intake/Output Summary (Last 24 hours) at 07/22/2021 0744 Last data filed at 07/22/2021 0425 Gross per 24 hour  Intake 240 ml  Output 2150 ml  Net -1910 ml     Physical Exam Resting comfortably NLB LLE: iVAC intact and holding suction WWP distally but DP pulse not palpable (stable from preop exam)   Assessment 81 y.o. male s/p Procedure(s) (LRB): INTRAMEDULLARY (IM) NAIL INTERTROCHANTRIC (Left) APPLICATION OF WOUND VAC (Left)  Plan Mobility: Out of bed with PT/OT Pain control: Continue to wean/titrate to appropriate oral regimen DVT Prophylaxis: Lovenox 30 mg daily, or alternate per primary medical team Further surgical plans: None RUE: No restrictions, weightbearing as tolerated LUE: No restrictions, weightbearing as tolerated RLE: No restrictions, weightbearing as tolerated LLE: No restrictions, weightbearing as tolerated Disposition: Per primary team, as medically appropriate Dressing care: The PREVENA incisional VAC (iVAC) should be left on for 7 days and then removed.  After that the wounds may be dressed with dry sterile gauze dressing and Tegaderm (clear adhesive plastic squares).  If the PREVENA hose is connected to a large VAC machine in the hospital, this should be switched to the small portable PREVENA unit prior to discharge.  Please make sure a responsible party understands how to operate the [facility] unit before discharge, including how to charge the unit.  Please note that the [facility] dressing will ONLY work if connected to suction!  If the [facility] unit fails and cannot be fixed/restored to  normal operation to restore suction, the sponge and plastic dressing should be removed and replaced with gauze/Tegaderm.  If the dressing loses suction due to a leak attempt to seal the leak with Tegaderm or remove the sponge plastic dressing replace with gauze/Tegaderm. Left leg cellulitis: antibiotics, wound care, and dressing changes per Wound Care/primary team Chronic pre-existing issues unrelated to current hip fracture Consider Wound Care consult for formal recs prior to discharge Please make appropriate plans for ongoing treatment of chronic cellulitis, venous stasis, and wound care after discharge--will not be managed/treated by my me as this is outside of the scope of my practice Remainder of care per primary team Follow-up: Please call [facility] [phone]) to schedule follow-up appointment for 2 weeks after surgery.  I have verified that my discharge instructions and follow-up information have been entered in the Discharge Navigator in Epic.  These should automatically populate in the AVS.  Please print the AVS in its entirety and ensure that the patient or a responsible party has a complete copy of the AVS before they are discharged.  If there are questions regarding discharge instructions or follow-up before the AVS is generated, please check the Discharge Navigator before attempting to contact the surgeon/office.  If unsure how to access the Discharge Navigator or the information contained in the Discharge Navigator, or how to generate/print the AVS, please contact the appropriate Nurse, learning disability.   I have printed the following prescriptions and placed them in the patient's hard chart: Lovenox and Norco    Georgeanna Harrison M.D. Orthopaedic Surgery [facility]

## 2021-07-25 ENCOUNTER — Telehealth (HOSPITAL_COMMUNITY): Payer: Self-pay | Admitting: *Deleted

## 2021-07-25 ENCOUNTER — Other Ambulatory Visit (HOSPITAL_COMMUNITY): Payer: Medicare Other

## 2021-07-25 ENCOUNTER — Ambulatory Visit (HOSPITAL_COMMUNITY): Payer: Medicare Other | Admitting: Hematology

## 2021-07-25 NOTE — Telephone Encounter (Signed)
Gary Nelson was contacted by telephone to verify understanding of discharge instructions status post their most recent discharge from the hospital on the date:  07/22/21.  Inpatient discharge AVS was re-reviewed with patient, along with cancer center appointments.  Verification of understanding for oncology specific follow-up was validated using the Teach Back method.    Transportation to appointments were confirmed for the patient as being  [employer] .  [name] questions were addressed to their satisfaction upon completion of this post discharge follow-up call for outpatient oncology.   Message left for Belinda in transportation @ [facility] with appointment details.

## 2021-08-22 ENCOUNTER — Inpatient Hospital Stay (HOSPITAL_COMMUNITY): Payer: No Typology Code available for payment source | Attending: Hematology

## 2021-08-22 ENCOUNTER — Inpatient Hospital Stay (HOSPITAL_BASED_OUTPATIENT_CLINIC_OR_DEPARTMENT_OTHER): Payer: No Typology Code available for payment source | Admitting: Hematology

## 2021-08-22 VITALS — BP 119/87 | HR 95 | Temp 98.4°F | Resp 19 | Ht 61.0 in | Wt 263.0 lb

## 2021-08-22 DIAGNOSIS — Z7901 Long term (current) use of anticoagulants: Secondary | ICD-10-CM | POA: Diagnosis not present

## 2021-08-22 DIAGNOSIS — S72142A Displaced intertrochanteric fracture of left femur, initial encounter for closed fracture: Secondary | ICD-10-CM | POA: Diagnosis not present

## 2021-08-22 DIAGNOSIS — Z9049 Acquired absence of other specified parts of digestive tract: Secondary | ICD-10-CM | POA: Insufficient documentation

## 2021-08-22 DIAGNOSIS — R5383 Other fatigue: Secondary | ICD-10-CM | POA: Insufficient documentation

## 2021-08-22 DIAGNOSIS — Z8249 Family history of ischemic heart disease and other diseases of the circulatory system: Secondary | ICD-10-CM | POA: Insufficient documentation

## 2021-08-22 DIAGNOSIS — D649 Anemia, unspecified: Secondary | ICD-10-CM | POA: Diagnosis not present

## 2021-08-22 DIAGNOSIS — J029 Acute pharyngitis, unspecified: Secondary | ICD-10-CM | POA: Diagnosis not present

## 2021-08-22 DIAGNOSIS — C189 Malignant neoplasm of colon, unspecified: Secondary | ICD-10-CM

## 2021-08-22 DIAGNOSIS — J90 Pleural effusion, not elsewhere classified: Secondary | ICD-10-CM | POA: Diagnosis not present

## 2021-08-22 DIAGNOSIS — R32 Unspecified urinary incontinence: Secondary | ICD-10-CM | POA: Insufficient documentation

## 2021-08-22 DIAGNOSIS — R0602 Shortness of breath: Secondary | ICD-10-CM | POA: Insufficient documentation

## 2021-08-22 DIAGNOSIS — R200 Anesthesia of skin: Secondary | ICD-10-CM | POA: Insufficient documentation

## 2021-08-22 DIAGNOSIS — Z86718 Personal history of other venous thrombosis and embolism: Secondary | ICD-10-CM | POA: Diagnosis not present

## 2021-08-22 DIAGNOSIS — Z5112 Encounter for antineoplastic immunotherapy: Secondary | ICD-10-CM | POA: Diagnosis present

## 2021-08-22 DIAGNOSIS — K766 Portal hypertension: Secondary | ICD-10-CM | POA: Diagnosis not present

## 2021-08-22 DIAGNOSIS — E876 Hypokalemia: Secondary | ICD-10-CM | POA: Insufficient documentation

## 2021-08-22 DIAGNOSIS — R059 Cough, unspecified: Secondary | ICD-10-CM | POA: Diagnosis not present

## 2021-08-22 DIAGNOSIS — C19 Malignant neoplasm of rectosigmoid junction: Secondary | ICD-10-CM | POA: Diagnosis present

## 2021-08-22 DIAGNOSIS — Z818 Family history of other mental and behavioral disorders: Secondary | ICD-10-CM | POA: Insufficient documentation

## 2021-08-22 DIAGNOSIS — I10 Essential (primary) hypertension: Secondary | ICD-10-CM | POA: Insufficient documentation

## 2021-08-22 DIAGNOSIS — F32A Depression, unspecified: Secondary | ICD-10-CM | POA: Insufficient documentation

## 2021-08-22 DIAGNOSIS — Z79899 Other long term (current) drug therapy: Secondary | ICD-10-CM | POA: Diagnosis not present

## 2021-08-22 DIAGNOSIS — I700 Atherosclerosis of aorta: Secondary | ICD-10-CM | POA: Insufficient documentation

## 2021-08-22 DIAGNOSIS — Z87891 Personal history of nicotine dependence: Secondary | ICD-10-CM | POA: Insufficient documentation

## 2021-08-22 DIAGNOSIS — C7801 Secondary malignant neoplasm of right lung: Secondary | ICD-10-CM | POA: Diagnosis not present

## 2021-08-22 LAB — CBC WITH DIFFERENTIAL/PLATELET
Abs Immature Granulocytes: 0.04 10*3/uL (ref 0.00–0.07)
Basophils Absolute: 0.1 10*3/uL (ref 0.0–0.1)
Basophils Relative: 0 %
Eosinophils Absolute: 0.2 10*3/uL (ref 0.0–0.5)
Eosinophils Relative: 1 %
HCT: 22 % — ABNORMAL LOW (ref 39.0–52.0)
Hemoglobin: 6.8 g/dL — CL (ref 13.0–17.0)
Immature Granulocytes: 0 %
Lymphocytes Relative: 7 %
Lymphs Abs: 0.8 10*3/uL (ref 0.7–4.0)
MCH: 33.5 pg (ref 26.0–34.0)
MCHC: 30.9 g/dL (ref 30.0–36.0)
MCV: 108.4 fL — ABNORMAL HIGH (ref 80.0–100.0)
Monocytes Absolute: 1 10*3/uL (ref 0.1–1.0)
Monocytes Relative: 9 %
Neutro Abs: 9.4 10*3/uL — ABNORMAL HIGH (ref 1.7–7.7)
Neutrophils Relative %: 83 %
Platelets: 169 10*3/uL (ref 150–400)
RBC: 2.03 MIL/uL — ABNORMAL LOW (ref 4.22–5.81)
RDW: 16.2 % — ABNORMAL HIGH (ref 11.5–15.5)
WBC: 11.3 10*3/uL — ABNORMAL HIGH (ref 4.0–10.5)
nRBC: 0 % (ref 0.0–0.2)

## 2021-08-22 LAB — COMPREHENSIVE METABOLIC PANEL
ALT: 25 U/L (ref 0–44)
AST: 26 U/L (ref 15–41)
Albumin: 2.3 g/dL — ABNORMAL LOW (ref 3.5–5.0)
Alkaline Phosphatase: 118 U/L (ref 38–126)
Anion gap: 2 — ABNORMAL LOW (ref 5–15)
BUN: 58 mg/dL — ABNORMAL HIGH (ref 8–23)
CO2: 21 mmol/L — ABNORMAL LOW (ref 22–32)
Calcium: 8.1 mg/dL — ABNORMAL LOW (ref 8.9–10.3)
Chloride: 114 mmol/L — ABNORMAL HIGH (ref 98–111)
Creatinine, Ser: 1.61 mg/dL — ABNORMAL HIGH (ref 0.61–1.24)
GFR, Estimated: 43 mL/min — ABNORMAL LOW (ref >60)
Glucose, Bld: 116 mg/dL — ABNORMAL HIGH (ref 70–99)
Potassium: 6.3 mmol/L (ref 3.5–5.1)
Sodium: 137 mmol/L (ref 135–145)
Total Bilirubin: 0.6 mg/dL (ref 0.3–1.2)
Total Protein: 5.8 g/dL — ABNORMAL LOW (ref 6.5–8.1)

## 2021-08-22 LAB — PREPARE RBC (CROSSMATCH)

## 2021-08-22 LAB — MAGNESIUM: Magnesium: 2.4 mg/dL (ref 1.7–2.4)

## 2021-08-22 MED ORDER — SODIUM POLYSTYRENE SULFONATE 15 GM/60ML PO SUSP
60.0000 g | Freq: Once | ORAL | Status: AC
  Administered 2021-08-22: 60 g via ORAL
  Filled 2021-08-22: qty 240

## 2021-08-22 NOTE — Patient Instructions (Addendum)
Gary Nelson at [facility] Discharge Instructions  You were seen and examined today by Dr. Delton Coombes.  Dr. Delton Coombes discussed your most recent lab work and your potassium is high we will give you kayexalate. Your hemoglobin is low so we will set you up for blood transfusion.  Follow-up as scheduled.    Thank you for choosing [place] at [facility] to provide your oncology and hematology care.  To afford each patient quality time with our provider, please arrive at least 15 minutes before your scheduled appointment time.   If you have a lab appointment with the [facility] please come in thru the Main Entrance and check in at the main information desk.  You need to re-schedule your appointment should you arrive 10 or more minutes late.  We strive to give you quality time with our providers, and arriving late affects you and other patients whose appointments are after yours.  Also, if you no show three or more times for appointments you may be dismissed from the clinic at the providers discretion.     Again, thank you for choosing [facility].  Our hope is that these requests will decrease the amount of time that you wait before being seen by our physicians.       _____________________________________________________________  Should you have questions after your visit to [name], please contact our office at [phone] and follow the prompts.  Our office hours are 8:00 a.m. and 4:30 p.m. Monday - Friday.  Please note that voicemails left after 4:00 p.m. may not be returned until the following business day.  We are closed weekends and major holidays.  You do have access to a nurse 24-7, just call the main number to the clinic [phone] and do not press any options, hold on the line and a nurse will answer the phone.    For prescription refill requests, have your pharmacy contact our office and allow 72 hours.     Due to Covid, you will need to wear a mask upon entering the hospital. If you do not have a mask, a mask will be given to you at the Main Entrance upon arrival. For doctor visits, patients may have 1 support person age 38 or older with them. For treatment visits, patients can not have anyone with them due to social distancing guidelines and our immunocompromised population.

## 2021-08-22 NOTE — Progress Notes (Signed)
CRITICAL VALUE ALERT Critical value received:  HGB 6.3 Date of notification:  08-22-2021 Time of notification: 14:55 pm. Critical value read back:  Yes.   Nurse who received alert:  B. Sloane Palmer RN MD notified time and response:  [place]. 14:33 pm. Patient to receive one unit of blood 08-23-2021. Standing orders.

## 2021-08-22 NOTE — Progress Notes (Signed)
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 [name]   10/04/2012 Pathology Results   4.5 cm poorly differentiated adenocarcinoma, negative margins, 0/7 lymph nodes for metastatic disease.  Oncotype recurrence score of 26 (high)    Chemotherapy   FOLFOX    Adverse Reaction   Progressive peripheral neuropathy    Chemotherapy   Infusional 5 FU.  Completed 6 months worth of adjuvant therapy.   02/23/2014 Procedure   Colonoscopy by Dr. Anthony Sar.   07/01/2015 PET scan   There is malignant range uptake with RLL pulmonary nodule suspicious for metastatic disease or primary pulm neoplasm. Nonspecific focus of uptake in the L femoral neck. No corresponding CT abnormality. Cannot rule out metastasis.   07/06/2015 Imaging   MR C-spine- C5-6 there is a broad-based disc bulge w R paracentral disc protrusion deforming the spinal cord. Moderate R foraminal stenosis. C6-7 there is a central disc protrusion slightly eccentric towards the R and mildly deforming the ventral c-spine    09/16/2015 Procedure   RLL needle biopsy by IR.   09/17/2015 Pathology Results   Metastatic adenocarcinoma consistent with a primary colorectal adenocarcinoma.   10/12/2015 - 10/22/2015 Radiation Therapy   SBRT by Dr. [name] to RLL pulmonary lesion (biopsy proven to be oligometastasis).   12/09/2015 Imaging   CT chest- 1. Slight interval decrease in size of the right lower lobe pulmonary nodule. Some surrounding radiation  changes. 2. No mediastinal or hilar mass or adenopathy. 3. No new pulmonary nodules. 4. Stable underline emphysematous changes. 5. Cirrhotic changes involving the liver but no upper abdominal metastatic disease.   03/14/2016 Imaging   CT chest- 1. Continued mild reduction in the size of the treated right lobe pulmonary nodule. 2. Increased patchy consolidation, reticulation and ground-glass attenuation in the basilar right lower lobe, nonspecific, favor evolving postradiation change, recommend attention on follow-up chest CT. 3. No evidence of new or progressive metastatic disease in the chest. 4. Aortic atherosclerosis. Left main and 3 vessel coronary atherosclerosis. 5. Mild emphysema.   06/30/2016 Imaging   CT CAP- 1. The appearance of the treated right lower lobe nodule is essentially unchanged, and there are evolving postradiation changes in the base of the right lower lobe, as above. No definite signs of new metastatic disease are noted elsewhere in the chest, abdomen or pelvis. 2. Aortic atherosclerosis, in addition to left main and 3 vessel coronary artery disease. Assessment for potential risk factor modification, dietary therapy or pharmacologic therapy may be warranted, if clinically indicated. 3. Morphologic changes in the liver suggestive of cirrhosis, as above. 4. Additional incidental findings, similar prior studies, as above.   11/17/2016 Imaging   CT CAP Study Result   CLINICAL DATA:  Stage IV colon cancer originally diagnosed in August 2014 status post partial colectomy, with right lower lobe lung metastasis diagnosed August 2017 status post SBRT completed 10/22/2015. Restaging.   EXAM: CT CHEST, ABDOMEN, AND PELVIS WITH CONTRAST   TECHNIQUE: Multidetector CT imaging of the chest, abdomen and pelvis was  performed following the standard protocol during bolus administration of intravenous contrast.   CONTRAST:  133mL ISOVUE-300 IOPAMIDOL (ISOVUE-300)  INJECTION 61%   COMPARISON:  06/30/2016 CT chest, abdomen and pelvis.   FINDINGS: CT CHEST FINDINGS   Cardiovascular: Normal heart size. No significant pericardial fluid/thickening. Left main, left anterior descending, left circumflex and right coronary atherosclerosis. Atherosclerotic nonaneurysmal thoracic aorta. Normal caliber pulmonary arteries. No central pulmonary emboli. Right internal jugular MediPort terminates at the junction of the right and left brachiocephalic veins.   Mediastinum/Nodes: No discrete thyroid nodules. Unremarkable esophagus. No pathologically enlarged axillary, mediastinal or hilar lymph nodes.   Lungs/Pleura: No pneumothorax. No pleural effusion. There is a nodular 2.7 x 1.6 cm focus of consolidation in the medial basilar right lower lobe (series 3/ image 108), previously 1.6 x 1.1 cm. No appreciable change in patchy subpleural reticulation in both lungs without significant traction bronchiectasis or frank honeycombing. Mild centrilobular and paraseptal emphysema with mild diffuse bronchial wall thickening. No acute consolidative airspace disease or new significant pulmonary nodules.   Musculoskeletal: No aggressive appearing focal osseous lesions. Moderate thoracic spondylosis. Stable chronic left scapular deformity.   CT ABDOMEN PELVIS FINDINGS   Hepatobiliary: Normal liver with no liver mass. Cholecystectomy. No biliary ductal dilatation.   Pancreas: Normal, with no mass or duct dilation.   Spleen: Normal size. No mass.   Adrenals/Urinary Tract: Normal adrenals. No hydronephrosis. No renal masses. Normal bladder.   Stomach/Bowel: Grossly normal stomach. Normal caliber small bowel with no small bowel wall thickening. Stable appearance of the appendix, within normal limits. Stable postsurgical changes from partial distal colectomy with intact appearing distal colonic anastomosis. Oral contrast transits to the cecum. No large bowel wall  thickening or new pericholecystic fat stranding .   Vascular/Lymphatic: Atherosclerotic nonaneurysmal abdominal aorta. Patent portal, splenic, hepatic and renal veins. No pathologically enlarged lymph nodes in the abdomen or pelvis.   Reproductive:  Normal size prostate.   Other: No pneumoperitoneum, ascites or focal fluid collection. Stable subcutaneous calcified granulomas throughout the left lower back and gluteal regions.   Musculoskeletal: No aggressive appearing focal osseous lesions. Marked lumbar spondylosis.   IMPRESSION: 1. Nodular focus of consolidation in the medial basilar right lower lobe is increased in size. While this may represent evolving masslike radiation fibrosis, recurrence of the lung metastasis is not excluded. PET-CT would be useful for further evaluation. Otherwise, continued close chest CT surveillance is advised . 2. No additional potential new sites of metastatic disease in the chest, abdomen or pelvis. 3. No evidence of local tumor recurrence at the distal colonic anastomosis. 4. Left main and 3 vessel coronary atherosclerosis . 5. Aortic Atherosclerosis (ICD10-I70.0) and Emphysema (ICD10-J43.9).      11/30/2016 Relapse/Recurrence   PET: IMPRESSION: 1. Enlarging right lower lobe pulmonary nodule and slight increase in the SUV max suggesting residual neoplasm. No new pulmonary lesions. 2. No findings for local recurrence or abdominal/pelvic metastatic disease.   01/02/2017 - 01/12/2017 Radiation Therapy    The patient saw Dr. [name] for SBRT radiation treatment. The tumor in the right lung was treated with a course of stereotactic body radiation treatment. The patient received 50 Gy In 5 fractions at 10 Gy per fraction.      10/05/2017 - 10/05/2017 Chemotherapy   The patient had bevacizumab (AVASTIN) 900 mg in sodium chloride 0.9 % 100 mL chemo infusion, 7.3 mg/kg = 925 mg, Intravenous,  Once, 1 of 3 cycles Administration: 900 mg (10/05/2017)   for chemotherapy treatment.    Malignant  neoplasm metastatic to lung ([place])  10/20/2015 Initial Diagnosis   Lung metastasis ([facility])   03/28/2021 -  Chemotherapy   Patient is on Treatment Plan :  COLORECTAL Bevacizumab + Trifluridine/Tipiracil q28d     Colon cancer ([facility])  08/24/2020 Initial Diagnosis   Colon cancer ([facility])   03/28/2021 -  Chemotherapy   Patient is on Treatment Plan :  COLORECTAL Bevacizumab + Trifluridine/Tipiracil q28d       CANCER STAGING: Cancer Staging  Adenocarcinoma of colon ([facility]) Staging form: Colon and Rectum, AJCC 7th Edition - Clinical stage from 10/04/2012: Stage IIA (T3, N0, M0) - Signed by Baird Cancer, PA-C on 07/15/2015 - Pathologic stage from 09/17/2015: Stage IVA (T3, N0, M1a) - Signed by Baird Cancer, PA-C on 10/05/2015   INTERVAL HISTORY:  Gary Nelson, a 81 y.o. 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 Clerence was last seen on 06/27/2021.   Today he reports feeling poorly. He presented to the Ed on 6/11 for a left hip fracture, and he is now living at [facility]. He is being given Pot[facility] at [facility]. He reports sores on his buttock.   REVIEW OF SYSTEMS:  Review of Systems  Constitutional:  Positive for fatigue. Negative for appetite change.  HENT:   Positive for sore throat and trouble swallowing.   Respiratory:  Positive for cough and shortness of breath (2L O2).   Genitourinary:  Positive for bladder incontinence.   Neurological:  Positive for numbness.  Psychiatric/Behavioral:  Positive for depression. The patient is nervous/anxious.   All other systems reviewed and are negative.   PAST MEDICAL/SURGICAL HISTORY:  Past Medical History:  Diagnosis Date   Adenocarcinoma of colon ([place])    RLL -  Pulmonary nodule (06/2015, concerning for mets vs primary; to see RAD-ONC Dr. [name]); stage 2 adenocarinoma   2014 - Colon Cancer - surgery and chemo   Anemia    Blood  infection    CHF (congestive heart failure) ([place])    Cirrhosis of liver ([place]) 07/28/2019   Cystitis    Diabetes mellitus without complication ([place])    Type II   DVT (deep venous thrombosis) ([place]) 2014   post op   Hematuria    Hypertension    Iron deficiency 07/31/2019   Lung cancer ([place])    Stage IV   Neuropathy    OSA on CPAP    Portal hypertension ([place]) 07/28/2019   Prostatitis    Pulmonary nodule    Renal disorder    Sleep apnea    Past Surgical History:  Procedure Laterality Date   APPLICATION OF WOUND VAC Left 07/19/2021   Procedure: APPLICATION OF WOUND VAC;  Surgeon: Georgeanna Harrison, MD;  Location: [facility];  Service: Orthopedics;  Laterality: Left;   BACK SURGERY     CATARACT EXTRACTION     left   CATARACT EXTRACTION W/PHACO Right 04/11/2012   Procedure: CATARACT EXTRACTION PHACO AND INTRAOCULAR LENS PLACEMENT (IOC);  Surgeon: Tonny Branch, MD;  Location: [facility];  Service: Ophthalmology;  Laterality: Right;  CDE:62.48   CERVICAL FUSION     c4-c5   CHOLECYSTECTOMY     COLON SURGERY  09/29/12   Colon resection for cancer   COLONOSCOPY     EYE SURGERY     cataract   GIVENS CAPSULE STUDY N/A 03/15/2015   Procedure: GIVENS CAPSULE STUDY;  Surgeon: Rogene [place], MD;  Location: [facility];  Service:  Endoscopy;  Laterality: N/A;  730   INTRAMEDULLARY (IM) NAIL INTERTROCHANTERIC Left 07/19/2021   Procedure: INTRAMEDULLARY (IM) NAIL INTERTROCHANTRIC;  Surgeon: Georgeanna Harrison, MD;  Location: [facility];  Service: Orthopedics;  Laterality: Left;   LEG SURGERY     ORIF FEMUR FRACTURE     right   POSTERIOR CERVICAL FUSION/FORAMINOTOMY N/A 07/27/2015   Procedure: Cervical four to Cervical seven Laminectomy with Cervical four to Thoracic one Dorsal internal fixation and fusion;  Surgeon: Kevan Ny Ditty, MD;  Location: [facility];  Service: Neurosurgery;  Laterality: N/A;  C4 to C7 Laminectomy with C4 to T1 Dorsal internal fixation and fusion    SOCIAL HISTORY:  Social History    Socioeconomic History   Marital status: Single    Spouse name: Not on file   Number of children: Not on file   Years of education: Not on file   Highest education level: Not on file  Occupational History   Not on file  Tobacco Use   Smoking status: Former    Packs/day: 1.00    Years: 55.00    Total pack years: 55.00    Types: Cigarettes    Start date: 02/07/1955   Smokeless tobacco: Never   Tobacco comments:    quit 2015  Vaping Use   Vaping Use: Never used  Substance and Sexual Activity   Alcohol use: Not Currently    Alcohol/week: 3.0 standard drinks of alcohol    Types: 3 Cans of beer per week   Drug use: No   Sexual activity: Yes    Birth control/protection: None  Other Topics Concern   Not on file  Social History Narrative   Not on file   Social Determinants of Health   Financial Resource Strain: Not on file  Food Insecurity: Not on file  Transportation Needs: Not on file  Physical Activity: Not on file  Stress: Not on file  Social Connections: Not on file  Intimate Partner Violence: Not on file    FAMILY HISTORY:  Family History  Problem Relation Age of Onset   Heart disease Father    Heart attack Father    Alzheimer's disease Mother     CURRENT MEDICATIONS:  Current Outpatient Medications  Medication Sig Dispense Refill   acetaminophen (TYLENOL) 325 MG tablet Take 2 tablets (650 mg total) by mouth every 6 (six) hours as needed for mild pain, fever or moderate pain (pain score 1-3 or temp > 100.5).     albuterol (VENTOLIN HFA) 108 (90 Base) MCG/ACT inhaler Inhale 2 puffs into the lungs every 6 (six) hours as needed for wheezing or shortness of breath.     aspirin EC 81 MG tablet Take 81 mg by mouth daily.     atorvastatin (LIPITOR) 10 MG tablet Take 10 mg by mouth daily at 6 PM.      Cholecalciferol 25 MCG (1000 UT) capsule Take 1,000 Units by mouth daily.     [employer] B-12 5000 MCG SUBL Place 1 tablet (5,000 mcg total) under the tongue daily. 30 tablet 6    diphenoxylate-atropine (LOMOTIL) 2.5-0.025 MG tablet Take 1 tablet by mouth 4 (four) times daily as needed for diarrhea or loose stools. 60 tablet 0   docusate sodium (COLACE) 100 MG capsule Take 1 capsule (100 mg total) by mouth 2 (two) times daily. 10 capsule 0   enoxaparin (LOVENOX) 30 MG/0.3ML injection Inject 0.3 mLs (30 mg total) into the skin daily. 72.0 mL 0   folic acid (FOLVITE) 1  MG tablet Take 1 tablet (1 mg total) by mouth daily.     furosemide (LASIX) 20 MG tablet Take 20 mg by mouth 2 (two) times daily.     gabapentin (NEURONTIN) 300 MG capsule Take 2 capsules (600 mg total) by mouth 2 (two) times daily.     HYDROcodone-acetaminophen (NORCO/VICODIN) 5-325 MG tablet Take 1-2 tablets by mouth every 6 (six) hours as needed for moderate pain or severe pain. 30 tablet 0   insulin NPH (HUMULIN N,NOVOLIN N) 100 UNIT/ML injection Inject 10 Units into the skin 2 (two) times daily before a meal. Per pt, 5 units every morning and 5 units in the evening     Multiple Vitamin (MULTIVITAMIN WITH MINERALS) TABS tablet Take 1 tablet by mouth daily.     ondansetron (ZOFRAN) 4 MG tablet Take 1 tablet (4 mg total) by mouth every 6 (six) hours as needed for nausea.  0   OXYGEN Inhale 2 L into the lungs continuous. At night time only     tamsulosin (FLOMAX) 0.4 MG CAPS capsule Take 1 capsule by mouth daily.     telmisartan (MICARDIS) 20 MG tablet Take 20 mg by mouth daily.     thiamine 100 MG tablet Take 1 tablet (100 mg total) by mouth daily.     No current facility-administered medications for this visit.    ALLERGIES:  No Known Allergies  PHYSICAL EXAM:  Performance status (ECOG): 2 - Symptomatic, <50% confined to bed  There were no vitals filed for this visit. Wt Readings from Last 3 Encounters:  07/21/21 212 lb 4.9 oz (96.3 kg)  06/06/21 244 lb 8 oz (110.9 kg)  05/10/21 251 lb (113.9 kg)   Physical Exam Vitals reviewed.  Constitutional:      Appearance: Normal appearance. He is  obese.     Interventions: Nasal cannula in place.     Comments: In wheelchair  Cardiovascular:     Rate and Rhythm: Normal rate and regular rhythm.     Pulses: Normal pulses.     Heart sounds: Normal heart sounds.  Pulmonary:     Effort: Pulmonary effort is normal.     Breath sounds: Normal breath sounds.  Neurological:     General: No focal deficit present.     Mental Status: He is alert and oriented to person, place, and time.  Psychiatric:        Mood and Affect: Mood normal.        Behavior: Behavior normal.      LABORATORY DATA:  I have reviewed the labs as listed.     Latest Ref Rng & Units 08/22/2021    2:08 PM 07/22/2021    1:00 AM 07/21/2021    7:41 AM  CBC  WBC 4.0 - 10.5 K/uL 11.3  10.1    Hemoglobin 13.0 - 17.0 g/dL 6.8  7.3  8.5   Hematocrit 39.0 - 52.0 % 22.0  21.6  24.4   Platelets 150 - 400 K/uL 169  183        Latest Ref Rng & Units 07/22/2021    1:00 AM 07/21/2021    1:20 AM 07/20/2021    1:57 AM  CMP  Glucose 70 - 99 mg/dL 127  171  146   BUN 8 - 23 mg/dL 39  42  33   Creatinine 0.61 - 1.24 mg/dL 1.51  1.58  1.73   Sodium 135 - 145 mmol/L 139  133  135   Potassium 3.5 - 5.1  mmol/L 4.9  5.0  5.6   Chloride 98 - 111 mmol/L 106  105  106   CO2 22 - 32 mmol/L _0 Calcium 8.9 - 10.3 mg/dL 8.1  7.9  8.2     DIAGNOSTIC IMAGING:  I have independently reviewed the scans and discussed with the patient. No results found.   ASSESSMENT:  1.  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 - Oligometastasis with right lower lobe pulmonary nodule, status post SBRT from 10/12/2015 through 10/22/2015 by Dr. [name]. - Retreatment with SBRT to the right lung from 01/02/2017 through 01/12/2017. - Xeloda 2 weeks on/1 week off with 1 dose of Avastin on 10/05/2017, discontinued secondary to extensive rash in the groin and  buttock region. - Stivarga 80 mg 3 weeks on/1 week off from February 2020 through 12/17/2018. -Stivarga discontinued because of unhealed wounds in the groin.  They have healed up after discontinuation. - Stivarga 80 mg 3 weeks on/1 week off from 09/25/2020 through 01/13/2021 with progression in the right lower lobe lung mass. - RAS/RAF panel on 09/06/2017 shows BRAF [id number] mutation.  K-ras and NRAS negative. - PET scan on 01/06/2021 showed slight interval increase in size of the right lower lobe pulmonary mass which remains markedly hypermetabolic.  No new sites of increased uptake in the chest, abdomen or pelvis.  Small right pleural effusion. - She was evaluated by Dr. [name] XRT.  He was not felt to be a candidate for retreatment in the same area. - We have reached out to Dr. Kathlene Cote who did not feel ablation is feasible due to the size of the lesion of being more than 3 cm. - Lonsurf 40 mg twice daily on days 1-5 and 8-12 every 28 days with bevacizumab every 2 weeks started on 03/28/2021.  Lonsurf and bevacizumab discontinued 06/27/2021 for progression.   2.  Chronic ulcers: -He had developed ulcers in the right upper thigh which completely healed since we stopped Stivarga.    PLAN:  1.  Stage IV colon cancer to the lungs: -CT CAP on 06/20/2021 showed progression in the right lower lobe mass by few millimeters and slight interval increase in size of the moderate right pleural effusion with no additional sites of metastatic disease. - Lonsurf and bevacizumab were discontinued in May. - He fell and sustained left intertrochanteric fracture. - He had a left femur nail placement on 07/19/2021.  I have reviewed hospitalization records. - He is currently residing at [facility] rehab. - I have recommended restaging CT CAP with contrast. - We will follow-up on CEA level from today.  RTC after scan.  2.  Hypokalemia: - Potassium today 6.3. - He was given Kayexalate 60 g today. - Review of his  medications at the nursing home shows that he is on potassium 20 mEq daily.  I will discontinue potassium supplements.  3.  Macrocytic anemia: - He is on prophylactic Lovenox.  Not sure if he is losing blood from GI tract. - Hemoglobin today 6.8 with MCV 108. - We will schedule him for 1 unit PRBC tomorrow. - We will check ferritin, iron panel, O16 and folic acid day of next visit.   Orders placed this encounter:  No orders of the defined types were placed in this encounter.    Derek Jack, MD [facility] [phone]   I,  Thana Ates, am acting as a Education administrator for Dr. Derek Jack.  I, Derek Jack MD, have reviewed the above documentation for accuracy and completeness, and I agree with the above.

## 2021-08-22 NOTE — Progress Notes (Signed)
Patient given Kayexalate 60 mg per Dr. Tomie China order due to potassium of 6.3 today. Patient tolerated well. Patient remained stable throughout visit. Patient discharged ambulatory by wheelchair with family member.

## 2021-08-22 NOTE — Progress Notes (Signed)
Dr. Delton Coombes made aware of critical K+ 6.1 and will be addressed at today's visit.

## 2021-08-23 ENCOUNTER — Inpatient Hospital Stay (HOSPITAL_COMMUNITY): Payer: No Typology Code available for payment source

## 2021-08-23 DIAGNOSIS — K766 Portal hypertension: Secondary | ICD-10-CM | POA: Diagnosis not present

## 2021-08-23 DIAGNOSIS — R32 Unspecified urinary incontinence: Secondary | ICD-10-CM | POA: Diagnosis not present

## 2021-08-23 DIAGNOSIS — Z5112 Encounter for antineoplastic immunotherapy: Secondary | ICD-10-CM | POA: Diagnosis present

## 2021-08-23 DIAGNOSIS — Z818 Family history of other mental and behavioral disorders: Secondary | ICD-10-CM | POA: Diagnosis not present

## 2021-08-23 DIAGNOSIS — Z86718 Personal history of other venous thrombosis and embolism: Secondary | ICD-10-CM | POA: Diagnosis not present

## 2021-08-23 DIAGNOSIS — D649 Anemia, unspecified: Secondary | ICD-10-CM | POA: Diagnosis not present

## 2021-08-23 DIAGNOSIS — I700 Atherosclerosis of aorta: Secondary | ICD-10-CM | POA: Diagnosis not present

## 2021-08-23 DIAGNOSIS — I10 Essential (primary) hypertension: Secondary | ICD-10-CM | POA: Diagnosis not present

## 2021-08-23 DIAGNOSIS — J90 Pleural effusion, not elsewhere classified: Secondary | ICD-10-CM | POA: Diagnosis not present

## 2021-08-23 DIAGNOSIS — Z8249 Family history of ischemic heart disease and other diseases of the circulatory system: Secondary | ICD-10-CM | POA: Diagnosis not present

## 2021-08-23 DIAGNOSIS — Z7901 Long term (current) use of anticoagulants: Secondary | ICD-10-CM | POA: Diagnosis not present

## 2021-08-23 DIAGNOSIS — Z87891 Personal history of nicotine dependence: Secondary | ICD-10-CM | POA: Diagnosis not present

## 2021-08-23 DIAGNOSIS — C19 Malignant neoplasm of rectosigmoid junction: Secondary | ICD-10-CM | POA: Diagnosis present

## 2021-08-23 DIAGNOSIS — R059 Cough, unspecified: Secondary | ICD-10-CM | POA: Diagnosis not present

## 2021-08-23 DIAGNOSIS — S72142A Displaced intertrochanteric fracture of left femur, initial encounter for closed fracture: Secondary | ICD-10-CM | POA: Diagnosis not present

## 2021-08-23 DIAGNOSIS — F32A Depression, unspecified: Secondary | ICD-10-CM | POA: Diagnosis not present

## 2021-08-23 DIAGNOSIS — Z79899 Other long term (current) drug therapy: Secondary | ICD-10-CM | POA: Diagnosis not present

## 2021-08-23 DIAGNOSIS — R0602 Shortness of breath: Secondary | ICD-10-CM | POA: Diagnosis not present

## 2021-08-23 DIAGNOSIS — R5383 Other fatigue: Secondary | ICD-10-CM | POA: Diagnosis not present

## 2021-08-23 DIAGNOSIS — E876 Hypokalemia: Secondary | ICD-10-CM | POA: Diagnosis not present

## 2021-08-23 DIAGNOSIS — Z9049 Acquired absence of other specified parts of digestive tract: Secondary | ICD-10-CM | POA: Diagnosis not present

## 2021-08-23 DIAGNOSIS — C7801 Secondary malignant neoplasm of right lung: Secondary | ICD-10-CM | POA: Diagnosis not present

## 2021-08-23 DIAGNOSIS — C189 Malignant neoplasm of colon, unspecified: Secondary | ICD-10-CM

## 2021-08-23 DIAGNOSIS — R200 Anesthesia of skin: Secondary | ICD-10-CM | POA: Diagnosis not present

## 2021-08-23 DIAGNOSIS — J029 Acute pharyngitis, unspecified: Secondary | ICD-10-CM | POA: Diagnosis not present

## 2021-08-23 LAB — PREPARE RBC (CROSSMATCH)

## 2021-08-23 MED ORDER — SODIUM CHLORIDE 0.9% IV SOLUTION
250.0000 mL | Freq: Once | INTRAVENOUS | Status: AC
  Administered 2021-08-23: 250 mL via INTRAVENOUS

## 2021-08-23 MED ORDER — DIPHENHYDRAMINE HCL 25 MG PO CAPS
25.0000 mg | ORAL_CAPSULE | Freq: Once | ORAL | Status: AC
  Administered 2021-08-23: 25 mg via ORAL
  Filled 2021-08-23: qty 1

## 2021-08-23 MED ORDER — HEPARIN SOD (PORK) LOCK FLUSH 100 UNIT/ML IV SOLN
500.0000 [IU] | Freq: Every day | INTRAVENOUS | Status: AC | PRN
  Administered 2021-08-23: 500 [IU]

## 2021-08-23 MED ORDER — SODIUM CHLORIDE 0.9% FLUSH: 10.0000 mL | INTRAVENOUS | Status: DC | PRN

## 2021-08-23 MED ORDER — ACETAMINOPHEN 325 MG PO TABS
650.0000 mg | ORAL_TABLET | Freq: Once | ORAL | Status: AC
  Administered 2021-08-23: 650 mg via ORAL
  Filled 2021-08-23: qty 2

## 2021-08-23 MED ORDER — SODIUM CHLORIDE 0.9% FLUSH
10.0000 mL | INTRAVENOUS | Status: AC | PRN
  Administered 2021-08-23: 10 mL

## 2021-08-23 NOTE — Progress Notes (Signed)
One unit of blood given per orders. Patient tolerated it well without problems. Vitals stable and discharged home from clinic via wheelchair to nursing home. Follow up as scheduled.

## 2021-08-23 NOTE — Patient Instructions (Signed)
[facility]  Discharge Instructions: Thank you for choosing [facility] to provide your oncology and hematology care.  If you have a lab appointment with the [facility], please come in thru the Main Entrance and check in at the main information desk.  Wear comfortable clothing and clothing appropriate for easy access to any Portacath or PICC line.   We strive to give you quality time with your provider. You may need to reschedule your appointment if you arrive late (15 or more minutes).  Arriving late affects you and other patients whose appointments are after yours.  Also, if you miss three or more appointments without notifying the office, you may be dismissed from the clinic at the provider's discretion.      For prescription refill requests, have your pharmacy contact our office and allow 72 hours for refills to be completed.    One unit of blood today.       To help prevent nausea and vomiting after your treatment, we encourage you to take your nausea medication as directed.  BELOW ARE SYMPTOMS THAT SHOULD BE REPORTED IMMEDIATELY: *FEVER GREATER THAN 100.4 F (38 C) OR HIGHER *CHILLS OR SWEATING *NAUSEA AND VOMITING THAT IS NOT CONTROLLED WITH YOUR NAUSEA MEDICATION *UNUSUAL SHORTNESS OF BREATH *UNUSUAL BRUISING OR BLEEDING *URINARY PROBLEMS (pain or burning when urinating, or frequent urination) *BOWEL PROBLEMS (unusual diarrhea, constipation, pain near the anus) TENDERNESS IN MOUTH AND THROAT WITH OR WITHOUT PRESENCE OF ULCERS (sore throat, sores in mouth, or a toothache) UNUSUAL RASH, SWELLING OR PAIN  UNUSUAL VAGINAL DISCHARGE OR ITCHING   Items with * indicate a potential emergency and should be followed up as soon as possible or go to the Emergency Department if any problems should occur.  Please show the CHEMOTHERAPY ALERT CARD or IMMUNOTHERAPY ALERT CARD at check-in to the Emergency Department and triage nurse.  Should you have questions after  your visit or need to cancel or reschedule your appointment, please contact [facility] [phone]  and follow the prompts.  Office hours are 8:00 a.m. to 4:30 p.m. Monday - Friday. Please note that voicemails left after 4:00 p.m. may not be returned until the following business day.  We are closed weekends and major holidays. You have access to a nurse at all times for urgent questions. Please call the main number to the clinic [phone] and follow the prompts.  For any non-urgent questions, you may also contact your provider using MyChart. We now offer e-Visits for anyone 73 and older to request care online for non-urgent symptoms. For details visit [email or web address].   Also download the MyChart app! Go to the app store, search "MyChart", open the app, select [facility], and log in with your MyChart username and password.  Masks are optional in the cancer centers. If you would like for your care team to wear a mask while they are taking care of you, please let them know. For doctor visits, patients may have with them one support person who is at least 80 years old. At this time, visitors are not allowed in the infusion area.

## 2021-08-24 LAB — TYPE AND SCREEN
ABO/RH(D): A NEG
Antibody Screen: NEGATIVE
Unit division: 0

## 2021-08-24 LAB — BPAM RBC
Blood Product Expiration Date: 202308032359
ISSUE DATE / TIME: 202307181235
Unit Type and Rh: 600

## 2021-08-24 LAB — CEA: CEA: 25.6 ng/mL — ABNORMAL HIGH (ref 0.0–4.7)

## 2021-08-25 LAB — TYPE AND SCREEN
ABO/RH(D): A NEG
Antibody Screen: NEGATIVE
Unit division: 0

## 2021-08-25 LAB — BPAM RBC
Blood Product Expiration Date: 202308032359
ISSUE DATE / TIME: 202307181235
Unit Type and Rh: 600

## 2021-08-26 ENCOUNTER — Encounter: Payer: Self-pay | Admitting: Orthopedic Surgery

## 2021-08-26 ENCOUNTER — Ambulatory Visit (INDEPENDENT_AMBULATORY_CARE_PROVIDER_SITE_OTHER): Payer: Medicare Other | Admitting: Orthopedic Surgery

## 2021-08-26 DIAGNOSIS — M1712 Unilateral primary osteoarthritis, left knee: Secondary | ICD-10-CM | POA: Diagnosis not present

## 2021-08-26 NOTE — Progress Notes (Signed)
Office Visit Note   Patient: Gary Nelson           Date of Birth: 01-15-1941           MRN: [id number] Visit Date: 08/26/2021 Requested by: Ledell Noss [place],  [place] [address] PCP: Ledell Noss, [facility]  Subjective: Chief Complaint  Patient presents with   Left Knee - Pain    HPI: [name] is a 81 year old patient with left knee pain.  Had a fall and left hip fracture with IMH S about a month ago.  Has known left knee arthritis which has since been treated with an intra-articular injection by the primary physician.  He does not really walk and it is difficult for him to stand.  He has had 2 back surgeries.  He is on home O2.              ROS: All systems reviewed are negative as they relate to the chief complaint within the history of present illness.  Patient denies  fevers or chills.   Assessment & Plan: Visit Diagnoses:  1. Arthritis of left knee     Plan: Impression is left knee arthritis with recent injection performed.  No indication for further intervention.  Could consider another injection in about 3 months.  He has fairly reasonable range of motion at this time and minimal hip pain.  Follow-up with [place] as needed.  Follow-Up Instructions: Return if symptoms worsen or fail to improve.   Orders:  No orders of the defined types were placed in this encounter.  No orders of the defined types were placed in this encounter.     Procedures: No procedures performed   Clinical Data: No additional findings.  Objective: Vital Signs: There were no vitals taken for this visit.  Physical Exam:   Constitutional: Patient appears well-developed HEENT:  Head: Normocephalic Eyes:EOM are normal Neck: Normal range of motion Cardiovascular: Normal rate Pulmonary/chest: Effort normal Neurologic: Patient is alert Skin: Skin is warm Psychiatric: Patient has normal mood and affect   Ortho Exam: Ortho exam demonstrates intact extensor  mechanism on the left leg.  Range of motion is about 5 to 55 degrees.  Collaterals are stable.  No effusion in the left knee.  Not too much pain in the left hip with circumduction.  Specialty Comments:  No specialty comments available.  Imaging: No results found.   PMFS History: Patient Active Problem List   Diagnosis Date Noted   Stage 3b chronic kidney disease (CKD) ([place]) 07/18/2021   HLD (hyperlipidemia) 07/18/2021   BPH (benign prostatic hyperplasia) 07/18/2021   Chronic respiratory failure with hypoxia ([place]) 07/18/2021   Closed comminuted intertrochanteric fracture of left femur ([place]) 07/17/2021   Benign essential hypertension 08/24/2020   Colon cancer ([place]) 08/24/2020   Personal history of colon cancer, stage II 08/24/2020   Primary localized osteoarthritis of both hips 08/24/2020   Type 2 diabetes mellitus without complication, with long-term current use of insulin ([place]) 08/24/2020   Cellulitis of left upper extremity 06/08/2020   Iron deficiency 07/31/2019   Cirrhosis of liver ([place]) 07/28/2019   Portal hypertension ([place]) 07/28/2019   Risk for falls 12/23/2017   History of DVT in adulthood 03/26/2017   Malignant neoplasm metastatic to lung ([place]) 10/20/2015   Adenocarcinoma of colon ([place]) 07/15/2015   Sepsis ([place]) 03/19/2013   CHF, chronic ([place]) 03/19/2013   Dvt femoral (deep venous thrombosis) ([place]) 02/19/2013   Venous insufficiency of both  lower extremities 09/09/2012   Diabetes mellitus ([place]) 09/09/2012   Morbid obesity ([place]) 09/09/2012   Sleep apnea 09/09/2012   CKD (chronic kidney disease) 09/09/2012   S/P cervical spinal fusion 09/27/2011   Cervical myelopathy ([place]) 06/14/2011   Past Medical History:  Diagnosis Date   Adenocarcinoma of colon ([place])    RLL -  Pulmonary nodule (06/2015, concerning for mets vs primary; to see [facility] Dr. [name]); stage 2 adenocarinoma   2014 - Colon Cancer - surgery and chemo   Anemia    Blood infection    CHF (congestive heart  failure) (H[facility])    Cirrhosis of liver ([place]) 07/28/2019   Cystitis    Diabetes mellitus without complication (H[facility])    Type II   DVT (deep venous thrombosis) ([place]) 2014   post op   Hematuria    Hypertension    Iron deficiency 07/31/2019   Lung cancer ([facility])    Stage IV   Neuropathy    OSA on CPAP    Portal hypertension ([place]) 07/28/2019   Prostatitis    Pulmonary nodule    Renal disorder    Sleep apnea     Family History  Problem Relation Age of Onset   Heart disease Father    Heart attack Father    Alzheimer's disease Mother     Past Surgical History:  Procedure Laterality Date   APPLICATION OF WOUND VAC Left 07/19/2021   Procedure: APPLICATION OF WOUND VAC;  Surgeon: Georgeanna [place], MD;  Location: [facility];  Service: Orthopedics;  Laterality: Left;   BACK SURGERY     CATARACT EXTRACTION     left   CATARACT EXTRACTION W/PHACO Right 04/11/2012   Procedure: CATARACT EXTRACTION PHACO AND INTRAOCULAR LENS PLACEMENT (IOC);  Surgeon: Tonny Branch, MD;  Location: [facility];  Service: Ophthalmology;  Laterality: Right;  CDE:62.48   CERVICAL FUSION     c4-c5   CHOLECYSTECTOMY     COLON SURGERY  09/29/12   Colon resection for cancer   COLONOSCOPY     EYE SURGERY     cataract   GIVENS CAPSULE STUDY N/A 03/15/2015   Procedure: GIVENS CAPSULE STUDY;  Surgeon: Rogene Houston, MD;  [facility];  Service: Endoscopy;  Laterality: N/A;  730   INTRAMEDULLARY (IM) NAIL INTERTROCHANTERIC Left 07/19/2021   Procedure: INTRAMEDULLARY (IM) NAIL INTERTROCHANTRIC;  Surgeon: Georgeanna [place], MD;  Location: [facility];  Service: Orthopedics;  Laterality: Left;   LEG SURGERY     ORIF FEMUR FRACTURE     right   POSTERIOR CERVICAL FUSION/FORAMINOTOMY N/A 07/27/2015   Procedure: Cervical four to Cervical seven Laminectomy with Cervical four to Thoracic one Dorsal internal fixation and fusion;  Surgeon: Kevan Ny Ditty, MD;  Location: [facility];  Service: Neurosurgery;  Laterality: N/A;  C4 to C7  Laminectomy with C4 to T1 Dorsal internal fixation and fusion   Social History   Occupational History   Not on file  Tobacco Use   Smoking status: Former    Packs/day: 1.00    Years: 55.00    Total pack years: 55.00    Types: Cigarettes    Start date: 02/07/1955   Smokeless tobacco: Never   Tobacco comments:    quit 2015  Vaping Use   Vaping Use: Never used  Substance and Sexual Activity   Alcohol use: Not Currently    Alcohol/week: 3.0 standard drinks of alcohol    Types: 3 Cans of beer per week   Drug use: No  Sexual activity: Yes    Birth control/protection: None

## 2021-08-29 ENCOUNTER — Other Ambulatory Visit: Payer: Self-pay

## 2021-08-29 ENCOUNTER — Other Ambulatory Visit (HOSPITAL_COMMUNITY): Payer: Self-pay | Admitting: Hematology

## 2021-09-07 ENCOUNTER — Ambulatory Visit (HOSPITAL_COMMUNITY): Admission: RE | Admit: 2021-09-07 | Payer: Medicare Other | Source: Ambulatory Visit

## 2021-09-08 ENCOUNTER — Inpatient Hospital Stay: Payer: Medicare Other | Admitting: Hematology

## 2021-09-08 ENCOUNTER — Inpatient Hospital Stay: Payer: Medicare Other

## 2021-09-09 ENCOUNTER — Other Ambulatory Visit: Payer: Self-pay

## 2021-09-13 ENCOUNTER — Other Ambulatory Visit: Payer: Self-pay

## 2021-09-28 ENCOUNTER — Other Ambulatory Visit: Payer: Medicare Other

## 2021-10-04 ENCOUNTER — Ambulatory Visit: Payer: Medicare Other | Admitting: Hematology

## 2021-10-06 ENCOUNTER — Other Ambulatory Visit (HOSPITAL_COMMUNITY): Payer: Self-pay

## 2021-10-06 ENCOUNTER — Telehealth: Payer: Self-pay | Admitting: *Deleted

## 2021-10-06 ENCOUNTER — Inpatient Hospital Stay: Payer: Medicare Other

## 2021-10-06 NOTE — Telephone Encounter (Signed)
[place] at [facility] notified of all of patient's upcoming appointments.  Advised that family may have contrast for CT.  If not made her aware that they can pick it up in the office prior to appt.

## 2021-10-07 ENCOUNTER — Other Ambulatory Visit: Payer: Medicare Other

## 2021-10-12 ENCOUNTER — Ambulatory Visit (HOSPITAL_COMMUNITY): Payer: Medicare Other

## 2021-11-01 ENCOUNTER — Ambulatory Visit: Payer: Medicare Other | Admitting: Hematology

## 2021-11-06 DEATH — deceased

## 2021-11-08 ENCOUNTER — Encounter (HOSPITAL_COMMUNITY): Payer: Self-pay | Admitting: Oncology

## 2021-11-08 ENCOUNTER — Encounter: Payer: Self-pay | Admitting: Hematology

## 2021-11-15 ENCOUNTER — Ambulatory Visit: Payer: Medicare Other | Admitting: Cardiology
